# Patient Record
Sex: Female | Born: 1980
Health system: Southern US, Community
[De-identification: ages and names within clinical notes are randomized; demographics above are authoritative.]

## PROBLEM LIST (undated history)

## (undated) DIAGNOSIS — G932 Benign intracranial hypertension: Secondary | ICD-10-CM

## (undated) DIAGNOSIS — J189 Pneumonia, unspecified organism: Secondary | ICD-10-CM

## (undated) DIAGNOSIS — Z9989 Dependence on other enabling machines and devices: Secondary | ICD-10-CM

## (undated) DIAGNOSIS — E119 Type 2 diabetes mellitus without complications: Secondary | ICD-10-CM

## (undated) DIAGNOSIS — M5430 Sciatica, unspecified side: Secondary | ICD-10-CM

## (undated) DIAGNOSIS — G4730 Sleep apnea, unspecified: Secondary | ICD-10-CM

## (undated) DIAGNOSIS — H3530 Unspecified macular degeneration: Secondary | ICD-10-CM

## (undated) DIAGNOSIS — M79606 Pain in leg, unspecified: Secondary | ICD-10-CM

## (undated) DIAGNOSIS — M549 Dorsalgia, unspecified: Secondary | ICD-10-CM

## (undated) DIAGNOSIS — L732 Hidradenitis suppurativa: Secondary | ICD-10-CM

## (undated) DIAGNOSIS — E785 Hyperlipidemia, unspecified: Secondary | ICD-10-CM

## (undated) DIAGNOSIS — Z972 Presence of dental prosthetic device (complete) (partial): Secondary | ICD-10-CM

## (undated) DIAGNOSIS — H548 Legal blindness, as defined in USA: Secondary | ICD-10-CM

## (undated) DIAGNOSIS — R42 Dizziness and giddiness: Secondary | ICD-10-CM

## (undated) DIAGNOSIS — G8929 Other chronic pain: Secondary | ICD-10-CM

## (undated) HISTORY — DX: Dizziness and giddiness: R42

## (undated) HISTORY — PX: CSF SHUNT: SHX92

## (undated) HISTORY — DX: Hyperlipidemia, unspecified: E78.5

## (undated) HISTORY — DX: Hidradenitis suppurativa: L73.2

## (undated) HISTORY — DX: Benign intracranial hypertension: G93.2

## (undated) HISTORY — DX: Unspecified macular degeneration: H35.30

---

## 2003-07-20 ENCOUNTER — Emergency Department (HOSPITAL_COMMUNITY): Admission: EM | Admit: 2003-07-20 | Discharge: 2003-07-21 | Payer: Self-pay | Admitting: *Deleted

## 2003-11-28 DIAGNOSIS — G932 Benign intracranial hypertension: Secondary | ICD-10-CM

## 2003-11-28 HISTORY — DX: Benign intracranial hypertension: G93.2

## 2011-11-02 ENCOUNTER — Ambulatory Visit: Payer: Self-pay | Admitting: Family Medicine

## 2011-11-06 ENCOUNTER — Ambulatory Visit: Payer: Self-pay | Admitting: Family Medicine

## 2011-12-08 ENCOUNTER — Ambulatory Visit: Payer: Self-pay | Admitting: Family Medicine

## 2012-01-01 ENCOUNTER — Ambulatory Visit (INDEPENDENT_AMBULATORY_CARE_PROVIDER_SITE_OTHER): Payer: Medicaid Other | Admitting: Family Medicine

## 2012-01-01 ENCOUNTER — Encounter: Payer: Self-pay | Admitting: Family Medicine

## 2012-01-01 VITALS — BP 111/79 | HR 83 | Ht 68.0 in | Wt 310.9 lb

## 2012-01-01 DIAGNOSIS — F1290 Cannabis use, unspecified, uncomplicated: Secondary | ICD-10-CM

## 2012-01-01 DIAGNOSIS — H548 Legal blindness, as defined in USA: Secondary | ICD-10-CM

## 2012-01-01 DIAGNOSIS — F1210 Cannabis abuse, uncomplicated: Secondary | ICD-10-CM

## 2012-01-01 DIAGNOSIS — E663 Overweight: Secondary | ICD-10-CM

## 2012-01-01 DIAGNOSIS — Z720 Tobacco use: Secondary | ICD-10-CM

## 2012-01-01 DIAGNOSIS — E669 Obesity, unspecified: Secondary | ICD-10-CM

## 2012-01-01 DIAGNOSIS — F17200 Nicotine dependence, unspecified, uncomplicated: Secondary | ICD-10-CM

## 2012-01-01 DIAGNOSIS — Z0000 Encounter for general adult medical examination without abnormal findings: Secondary | ICD-10-CM

## 2012-01-01 DIAGNOSIS — Z131 Encounter for screening for diabetes mellitus: Secondary | ICD-10-CM

## 2012-01-01 LAB — COMPREHENSIVE METABOLIC PANEL
Albumin: 4.1 g/dL (ref 3.5–5.2)
BUN: 10 mg/dL (ref 6–23)
CO2: 26 mEq/L (ref 19–32)
Calcium: 9.1 mg/dL (ref 8.4–10.5)
Chloride: 105 mEq/L (ref 96–112)
Glucose, Bld: 85 mg/dL (ref 70–99)
Potassium: 4.3 mEq/L (ref 3.5–5.3)

## 2012-01-01 NOTE — Patient Instructions (Addendum)
I will send you the results in the mail.  I would like you to return for a pap smear and to recheck weight in 1 month.  Smoking: Call and get an appointment for smoking cessation class with Dr. Raymondo Band.   Weight management: 220lbs is the goal weight that we have set.  Walk daily-at apartment complex- 20-67min Consider meeting with nutritionist jeannine- call her for an appointment.   I recommend you stop smoking not only cigarettes but also marijuana

## 2012-01-01 NOTE — Progress Notes (Signed)
  Subjective:    Patient ID: Monica Huang, female    DOB: 1981-05-12, 31 y.o.   MRN: [id number]  HPI Patient here for new patient appointment in to establish care.  All past medical history, surgical history,social history, meds, allergies--updated under the appropriate areas of chart.  Smoking: Patient smokes half pack per day x10 years. Patient also uses marijuana daily. Patient states she would like to quit both. She states that the marijuana seems to help her vision. But otherwise knows that this is bad for her.  Patient states that she is ready to quit.no cough. No shortness of breath.  Weight management: Patient exercising 2 times a week for 45 minutes walking. States that she does not eat healthy diet. Has never met with nutritionist. Patient states that her goal weight is 220 pounds. no shortness of breath. No chest pain. Has had weight problems for a long time.  Health maintenance: Patient states last Pap smear in 2010. Patient has family history-mother and father-with diabetes. Has never had diabetes screen. Agrees to A1c screen today. Patient states she has not had A. Lipid screen. Is not fasting today.    Review of Systems As per above.    Objective:   Physical Exam  Constitutional: She appears well-developed.       obese  Neck: Thyromegaly (mild, no nodules) present.       + acanthosis nigricans  Cardiovascular: Normal rate, regular rhythm and normal heart sounds.   No murmur heard. Pulmonary/Chest: Effort normal. No respiratory distress. She has no wheezes. She has no rales.  Abdominal: Soft. She exhibits no distension. There is no tenderness.  Musculoskeletal: She exhibits no edema.  Neurological: She is alert.       Decreased vision  Skin: No rash noted.  Psychiatric: She has a normal mood and affect.          Assessment & Plan:

## 2012-01-02 ENCOUNTER — Encounter: Payer: Self-pay | Admitting: Family Medicine

## 2012-01-02 ENCOUNTER — Telehealth: Payer: Self-pay | Admitting: *Deleted

## 2012-01-02 NOTE — Telephone Encounter (Signed)
Faxed ROI to 3 [phone] .Arlyss Repress

## 2012-01-02 NOTE — Telephone Encounter (Signed)
Called pt. Need fax number from previous doctor in order to send ROI.  Waiting for call back.

## 2012-01-03 DIAGNOSIS — Z0000 Encounter for general adult medical examination without abnormal findings: Secondary | ICD-10-CM | POA: Insufficient documentation

## 2012-01-03 DIAGNOSIS — E669 Obesity, unspecified: Secondary | ICD-10-CM | POA: Insufficient documentation

## 2012-01-03 DIAGNOSIS — Z720 Tobacco use: Secondary | ICD-10-CM | POA: Insufficient documentation

## 2012-01-03 DIAGNOSIS — F1290 Cannabis use, unspecified, uncomplicated: Secondary | ICD-10-CM | POA: Insufficient documentation

## 2012-01-03 DIAGNOSIS — H548 Legal blindness, as defined in USA: Secondary | ICD-10-CM | POA: Insufficient documentation

## 2012-01-03 NOTE — Assessment & Plan Note (Signed)
Encouraged smoking cessation.  Pt to call and schedule appt with Dr. Raymondo Band for smoking cessation class.

## 2012-01-03 NOTE — Assessment & Plan Note (Signed)
Encouraged marijuana cessation.

## 2012-01-03 NOTE — Assessment & Plan Note (Signed)
Patient states last Pap smear in 2010- requesting record to be transferred.  Pt to go ahead and set up pap smear appt for in 1 month.  Will perform breast exam at time of pap smear in 1 month.  Patient has family history-mother and father-with diabetes. Has never had diabetes screen. Agrees to A1c screen today.  Patient states she has not had A. Lipid screen. Is not fasting today.

## 2012-01-03 NOTE — Assessment & Plan Note (Addendum)
220lbs is the goal weight that we have set.  Walk daily-at apartment complex- 20-61min Consider meeting with nutritionist jeannine- call her for an appointment.  Pt to return in 1 month for follow up on weight loss.  No TSH check.  Will obtain today. Difficult to do thyroid exam in setting of obesity, possible mild thyromegaly on exam.

## 2012-01-04 ENCOUNTER — Telehealth: Payer: Self-pay | Admitting: Family Medicine

## 2012-01-04 IMAGING — CR DG LUMBAR SPINE COMPLETE 4+V
5 series · 5 of 5 positions shown · non-contrast
Comparison: Abdomen films of [DATE]

CLINICAL DATA: Midline tenderness, fell 1 week ago

LUMBAR SPINE - COMPLETE 4+ VIEW

[t l-spine a.p.]
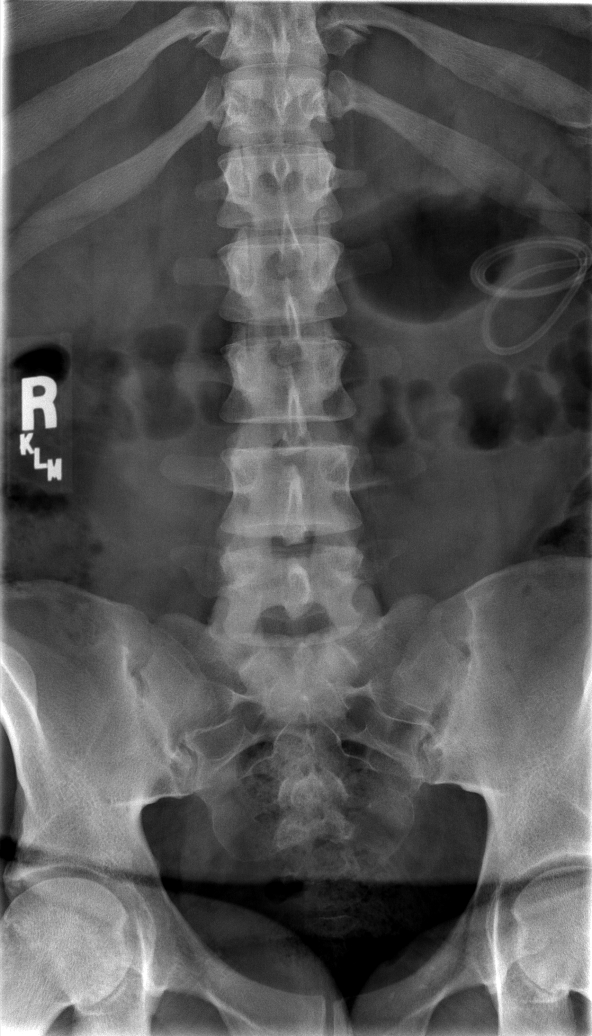

[t l-spine oblique exposure (1 of 2)]
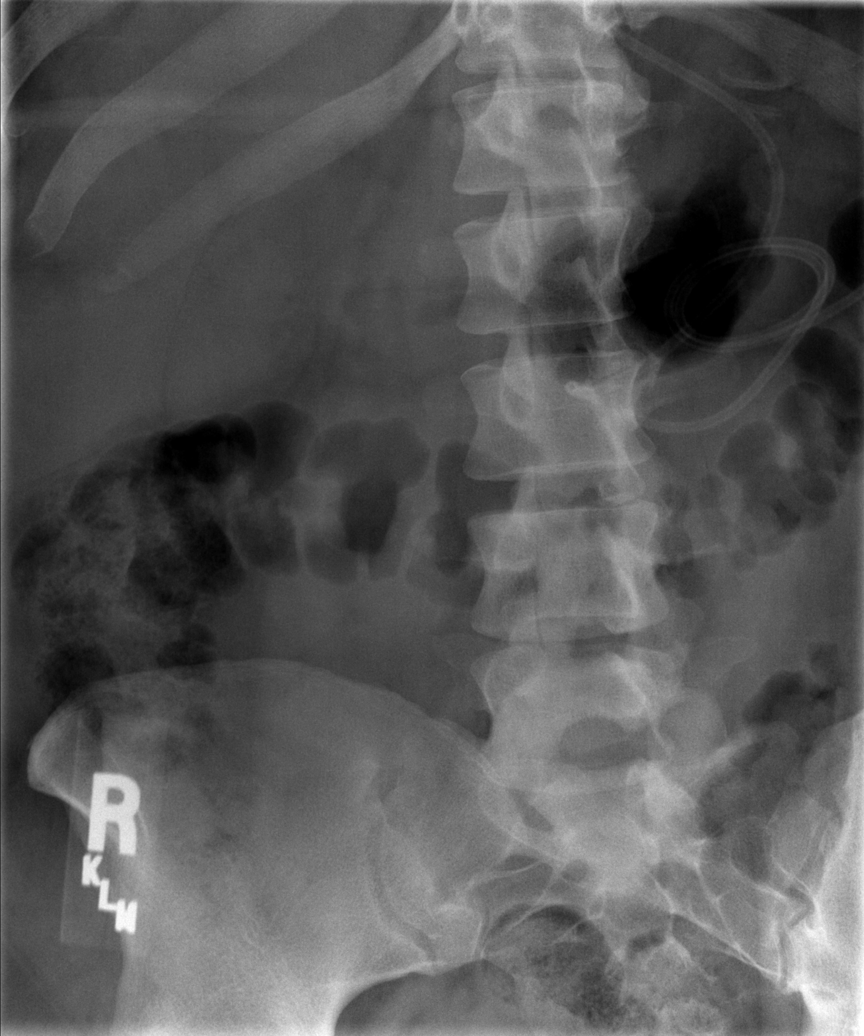

[t l-spine oblique exposure (2 of 2)]
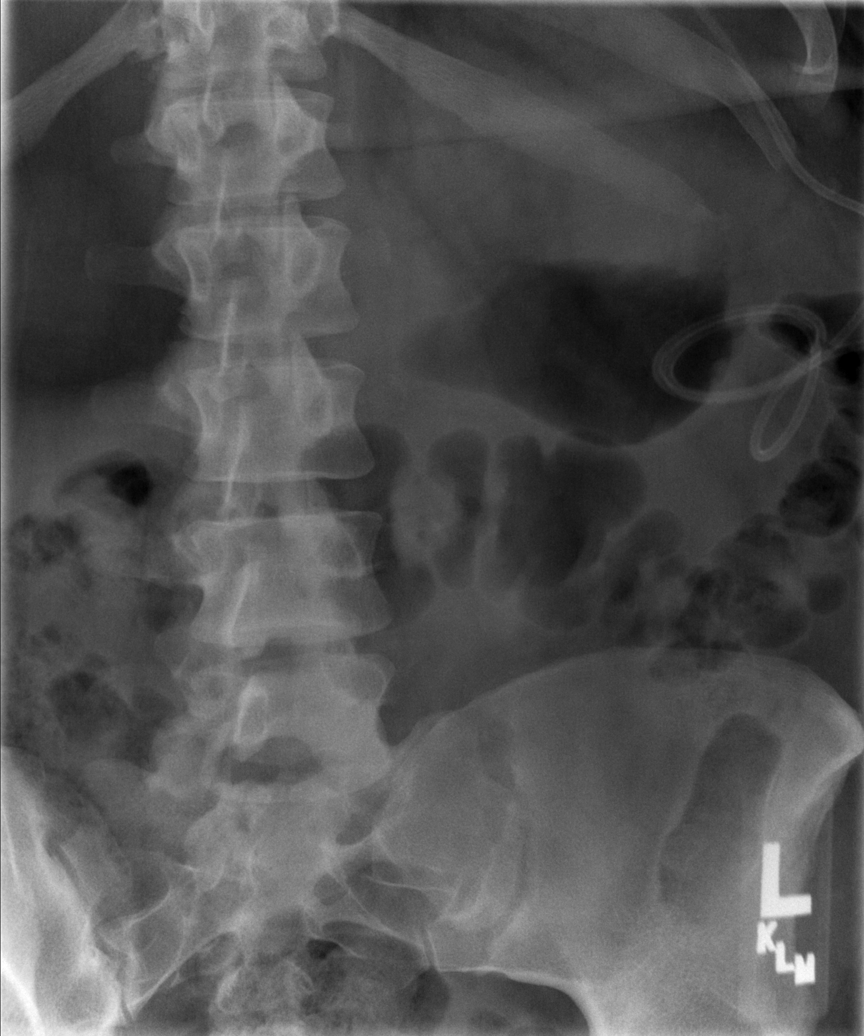

[t l-spine lat]
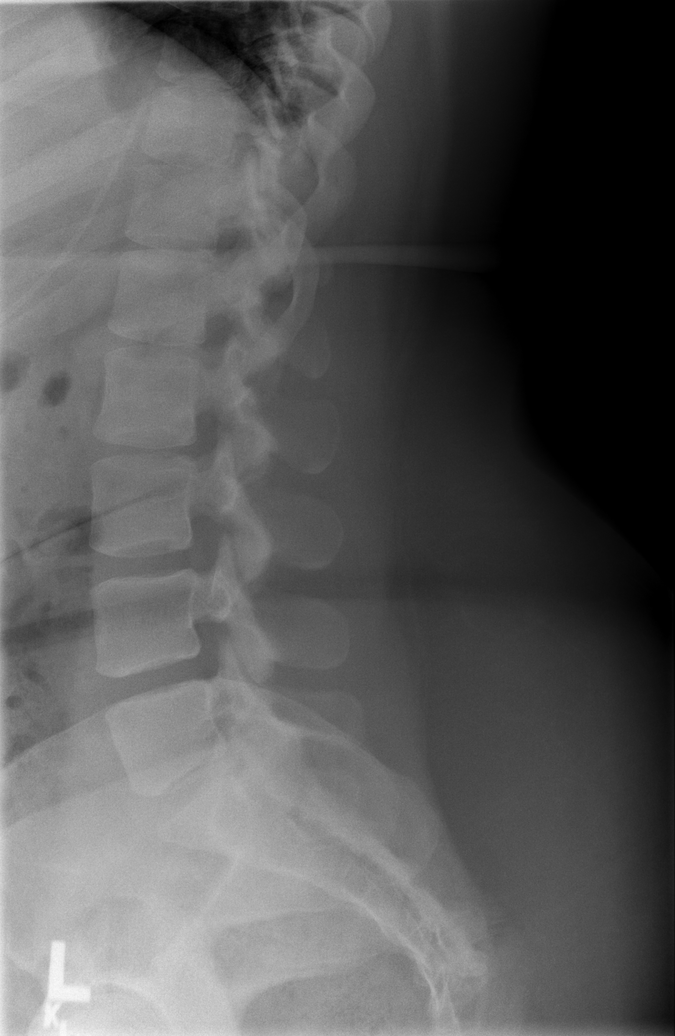

[t l-spine l5-s1 spot]
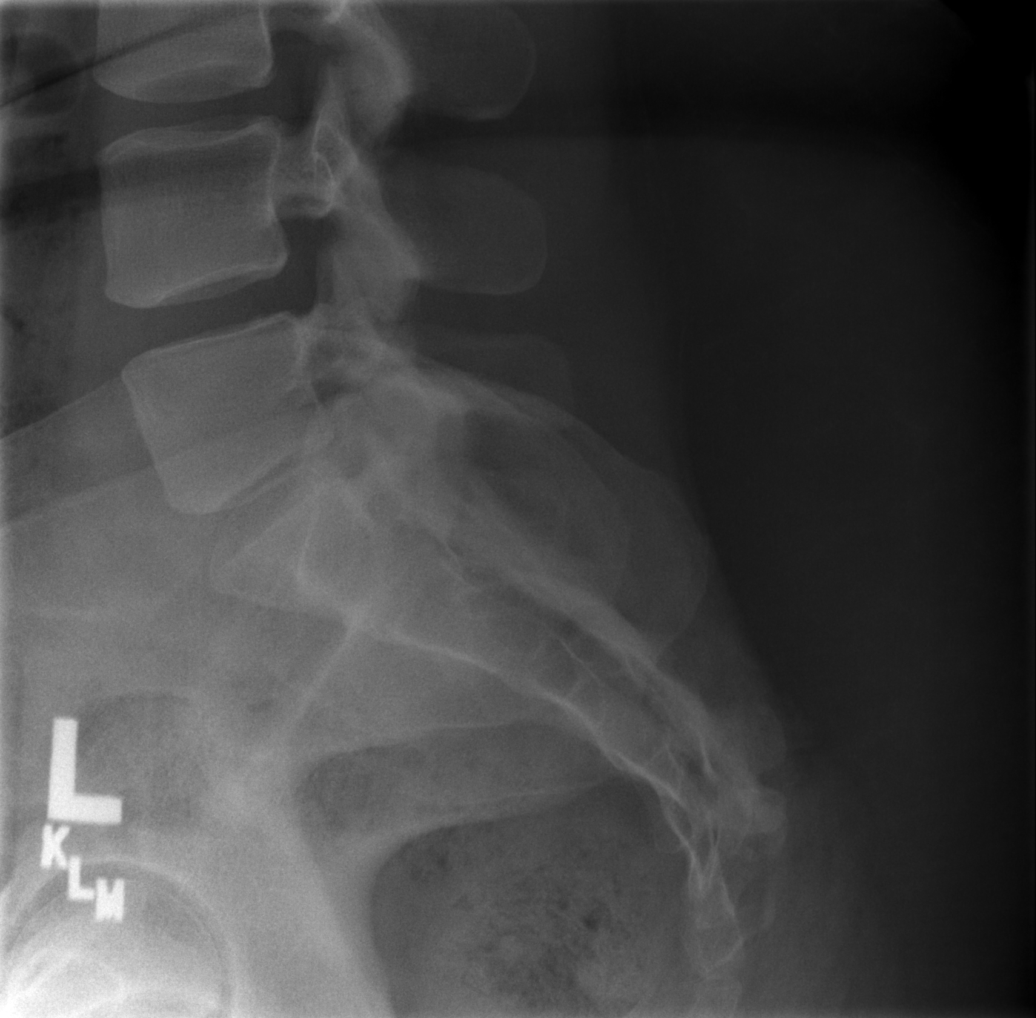

[5 of 5 positions shown; findings below may reference images not displayed]

FINDINGS: The lumbar vertebrae are in normal alignment.
Intervertebral disc spaces appear normal.  No compression deformity
is seen.  The SI joints appear normal.  A VP shunt catheter coils
in the left abdomen.
IMPRESSION: Normal alignment.  Normal disc spaces appear

## 2012-01-04 NOTE — Telephone Encounter (Signed)
Patient returned phone call and would like for nurse to call back to let her know if there was anything else needed other than fax number already given.

## 2012-01-04 NOTE — Telephone Encounter (Signed)
Called pt. Faxed ROI already. Did not call her. Lorenda Hatchet, Renato Battles

## 2012-01-24 ENCOUNTER — Telehealth: Payer: Self-pay | Admitting: Family Medicine

## 2012-01-24 NOTE — Telephone Encounter (Signed)
Is calling for a Eye Report and she needs to go to an Eye doctor to get this so she needs a referral to see an Eye MD.

## 2012-01-25 NOTE — Telephone Encounter (Signed)
Will fwd. To Dr.Caviness for review .Monica Huang  

## 2012-01-25 NOTE — Telephone Encounter (Signed)
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  Would like to see someone that is close to [place].

## 2012-01-25 NOTE — Telephone Encounter (Signed)
Left message to call back. Please ask pt: reason for eye doctor. Has she seen ophthalmologist in past? Name? Waiting for call back. Lorenda Hatchet, Renato Battles

## 2012-01-30 NOTE — Telephone Encounter (Signed)
Please let pt know that I do not have a specific opthomologist I refer patients to.  From my understanding all of the providers here in [place] do a good job.  Have pt look in phonebook or do internet search. If she would like me to send a referral all she needs to do is let me know the name and fax number of the provider and I will be glad to send a referral.

## 2012-01-31 ENCOUNTER — Encounter: Payer: Medicaid Other | Admitting: Family Medicine

## 2012-02-02 ENCOUNTER — Ambulatory Visit: Payer: Medicaid Other | Admitting: Pharmacist

## 2012-02-07 ENCOUNTER — Other Ambulatory Visit (HOSPITAL_COMMUNITY)
Admission: RE | Admit: 2012-02-07 | Discharge: 2012-02-07 | Disposition: A | Discharge status: Home / Self Care | Payer: Medicaid Other | Source: Ambulatory Visit | Attending: Family Medicine | Admitting: Family Medicine

## 2012-02-07 ENCOUNTER — Encounter: Payer: Self-pay | Admitting: Family Medicine

## 2012-02-07 ENCOUNTER — Ambulatory Visit (INDEPENDENT_AMBULATORY_CARE_PROVIDER_SITE_OTHER): Payer: Medicaid Other | Admitting: Family Medicine

## 2012-02-07 VITALS — BP 126/84 | HR 94 | Ht 68.0 in | Wt 308.0 lb

## 2012-02-07 DIAGNOSIS — E669 Obesity, unspecified: Secondary | ICD-10-CM

## 2012-02-07 DIAGNOSIS — Z01419 Encounter for gynecological examination (general) (routine) without abnormal findings: Secondary | ICD-10-CM | POA: Insufficient documentation

## 2012-02-07 DIAGNOSIS — Z720 Tobacco use: Secondary | ICD-10-CM

## 2012-02-07 DIAGNOSIS — F17200 Nicotine dependence, unspecified, uncomplicated: Secondary | ICD-10-CM

## 2012-02-07 DIAGNOSIS — N898 Other specified noninflammatory disorders of vagina: Secondary | ICD-10-CM

## 2012-02-07 DIAGNOSIS — Z113 Encounter for screening for infections with a predominantly sexual mode of transmission: Secondary | ICD-10-CM | POA: Insufficient documentation

## 2012-02-07 DIAGNOSIS — Z0000 Encounter for general adult medical examination without abnormal findings: Secondary | ICD-10-CM

## 2012-02-07 DIAGNOSIS — L03317 Cellulitis of buttock: Secondary | ICD-10-CM

## 2012-02-07 DIAGNOSIS — M79643 Pain in unspecified hand: Secondary | ICD-10-CM

## 2012-02-07 DIAGNOSIS — M545 Low back pain: Secondary | ICD-10-CM

## 2012-02-07 DIAGNOSIS — Z124 Encounter for screening for malignant neoplasm of cervix: Secondary | ICD-10-CM

## 2012-02-07 DIAGNOSIS — L0231 Cutaneous abscess of buttock: Secondary | ICD-10-CM

## 2012-02-07 DIAGNOSIS — M79609 Pain in unspecified limb: Secondary | ICD-10-CM

## 2012-02-07 NOTE — Patient Instructions (Signed)
Smoking: Call Dr. Raymondo Band and reschedule for smoking cessation appointment  Pap smear: I will mail you your result.   Weight management: Make an appointment to see me back on a Thursday in March in the nutrition-- Walk daily- at least  Hand/fingers: Try diclofenac cream on hands.  I am not sure what is causing this.   Left side pain: I think this is muscle.  Walking daily may help work this out.  If it doesn't get better with walking and exercise in the next week come back.  If new or worsening come back.

## 2012-02-07 NOTE — Progress Notes (Signed)
  Subjective:    Patient ID: Monica Huang, female    DOB: June 30, 1981, 30 y.o.   MRN: [id number]  HPI Patient here for yearly physical exam:   Screenings-recommended: Patient due for a tetanus today. Patient agrees to have Pap smear done today Patient also agrees to have breast exam done today-has not had any nipple drainage, nodules or lumps in breast.  Smoking cessation: Patient states that she is to smoking. Smokes a half pack per day x10 years. Also smoking marijuana. States she missed her appointment Dr. Hildred Laser for second cessation appointment. States she plans to reschedule.  Weight management: Patient states that she has not been exercising. She states that the weather outside as a barrier to her been able to work out. Also reports that she has not been eating a healthy diet.  Hand pain: Bilateral hand pain off and on since 2008. Now has become more constant over the past 3-4 weeks. Describes as a cramping in her hands. Sometimes comes on she is trying to do something with her hands late-term some finger grab something. Sometimes improves when she stretches her hands are massages them. No redness in joints. Sometimes perceives a swelling in her base of her right thumb.no fever.  Left side pain: Patient reports left side pain off and on times a couple of weeks. Worse with walking. Improved with lying down. No burning with urination. No retention. No frequency. No back pain. No fever. No incontinence. No headaches.       Review of Systems    as per above. Objective:   Physical Exam  Constitutional: She appears well-developed and well-nourished.  HENT:  Head: Normocephalic and atraumatic.  Eyes:       + visual impairment- pt baseline.   Neck: Normal range of motion. No thyromegaly present.  Cardiovascular: Normal rate, regular rhythm and normal heart sounds.   No murmur heard. Pulmonary/Chest: Effort normal. No respiratory distress. She has no wheezes. Right breast  exhibits no inverted nipple, no mass, no nipple discharge, no skin change and no tenderness. Left breast exhibits no inverted nipple, no mass, no nipple discharge, no skin change and no tenderness.  Abdominal: Soft. She exhibits no distension. There is no tenderness.  Genitourinary: Vagina normal and uterus normal.    There is no rash, tenderness, lesion or injury on the right labia. There is no rash, tenderness, lesion or injury on the left labia. Cervix exhibits discharge (scant white discharge). Cervix exhibits no motion tenderness and no friability. Right adnexum displays no mass, no tenderness and no fullness. Left adnexum displays no mass, no tenderness and no fullness.  Musculoskeletal: She exhibits no edema.       Hand exam bilateral- No redness. No swelling.  Normal strength bilateral.  Normal sensation.  No pain with palpation.   Left back- Minimal pain with palpation of left lower back.  Normal rom.  Normal strength in lower ext bilateral.  Normal reflexes.   Lymphadenopathy:    She has no cervical adenopathy.  Neurological: She is alert.  Skin: No rash noted.  Psychiatric: She has a normal mood and affect.          Assessment & Plan:

## 2012-02-09 ENCOUNTER — Telehealth: Payer: Self-pay | Admitting: Family Medicine

## 2012-02-09 ENCOUNTER — Other Ambulatory Visit: Payer: Self-pay | Admitting: Family Medicine

## 2012-02-09 MED ORDER — METRONIDAZOLE 500 MG PO TABS: 500.0000 mg | ORAL_TABLET | Freq: Two times a day (BID) | ORAL | Status: AC

## 2012-02-09 NOTE — Telephone Encounter (Signed)
Tried to call pt, but no answer, left message. Called pt to let her know that her Pap smear was NORMAL.  But trichomonas was found on vaginal discharge tests.  I have sent in a Rx for flagyl to patient pharmacy.  Pt to take as directed.  I left a message on pt phone asking her to call me back.  So I did not get to discuss this with patient. When pt calls back clinic staff can give the above message to the patient if I am not available to talk with her.  If she has any questions about the above I would be glad to call her.  Thanks, [employer]

## 2012-02-13 DIAGNOSIS — M79641 Pain in right hand: Secondary | ICD-10-CM | POA: Insufficient documentation

## 2012-02-13 DIAGNOSIS — M545 Low back pain: Secondary | ICD-10-CM | POA: Insufficient documentation

## 2012-02-13 DIAGNOSIS — L0231 Cutaneous abscess of buttock: Secondary | ICD-10-CM | POA: Insufficient documentation

## 2012-02-13 DIAGNOSIS — L03317 Cellulitis of buttock: Secondary | ICD-10-CM | POA: Insufficient documentation

## 2012-02-13 MED ORDER — DICLOFENAC SODIUM 1 % TD GEL: 1.0000 "application " | Freq: Four times a day (QID) | TRANSDERMAL | Status: DC

## 2012-02-13 NOTE — Assessment & Plan Note (Signed)
Most likely 2/2 msk etiology. No fever or urinary symptoms. Pt to walk daily and do home back exercises to see if this relieves pain.  Pt also to take otc pain relievers if needed.  Pt to return if any new or worsening of symptoms.

## 2012-02-13 NOTE — Assessment & Plan Note (Signed)
Unsure of cause of patients transient hand pain.  Pt to monitor symptoms.  Will give rx for diclofenac cream for patient to use to see if this helps symptoms.  Reviewed red flags for return with patient.

## 2012-02-13 NOTE — Assessment & Plan Note (Signed)
Encouraged smoking cessation.  Encouraged pt to reschedule with Dr. Raymondo Band.

## 2012-02-13 NOTE — Assessment & Plan Note (Addendum)
Pap smear obtained and sent to lab.  Will mail pt results.  All screening labwork completed at 01/01/12 appointment.  Pt was not screening for RPR or HIV at today's appointment.  Need to discuss with patient to see if she would like to have this screening at her next appt.

## 2012-02-13 NOTE — Assessment & Plan Note (Addendum)
Encouraged increased activity.  Goal states that her goal is to walk 20 minutes daily.  Also offered nutrition consult to discuss nutrition.  Pt to return in 1-2 months for recheck on weight management.

## 2012-02-13 NOTE — Assessment & Plan Note (Signed)
Draining and healing well.  No further intervention needed at this time.  Red flags for return discussed with patient.

## 2012-02-19 ENCOUNTER — Telehealth: Payer: Self-pay | Admitting: *Deleted

## 2012-02-19 NOTE — Telephone Encounter (Signed)
Patient called for results of pap and STD screen, she was informed everything was normal.Monica Huang, Rodena Medin

## 2012-02-19 NOTE — Telephone Encounter (Signed)
Pt informed of Trichomonas on pap smear. Advised to take meds that were sent to the pharmacy. Pt reports, that she has no symptoms of vaginal itching or discharge and has been with the same partner for 5 years. She had Trichomonas about 5 years ago and was treated for it. Her partner denies symptoms too. Pt is very upset about this and request for Dr.Caviness to call her. Fwd to [facility] for review.  Lorenda Hatchet, Renato Battles

## 2012-02-22 ENCOUNTER — Ambulatory Visit: Payer: Medicaid Other | Admitting: Family Medicine

## 2012-02-22 NOTE — Telephone Encounter (Signed)
Called patient and answered questions.  Pt to call and schedule appointment for follow up. Pt prefers to have wet prep done for confirmation of trich that was found on pap smear sample.  At f/up appointment will do wet prep and decide on treatment plan.

## 2012-03-02 ENCOUNTER — Emergency Department (HOSPITAL_COMMUNITY)
Admission: EM | Admit: 2012-03-02 | Discharge: 2012-03-03 | Disposition: A | Discharge status: Home / Self Care | Payer: Medicaid Other | Attending: Emergency Medicine | Admitting: Emergency Medicine

## 2012-03-02 ENCOUNTER — Encounter (HOSPITAL_COMMUNITY): Payer: Self-pay | Admitting: *Deleted

## 2012-03-02 DIAGNOSIS — E785 Hyperlipidemia, unspecified: Secondary | ICD-10-CM | POA: Insufficient documentation

## 2012-03-02 DIAGNOSIS — M545 Low back pain, unspecified: Secondary | ICD-10-CM | POA: Insufficient documentation

## 2012-03-02 MED ORDER — OXYCODONE-ACETAMINOPHEN 5-325 MG PO TABS: 1.0000 | ORAL_TABLET | ORAL | Status: DC | PRN

## 2012-03-02 MED ORDER — KETOROLAC TROMETHAMINE 60 MG/2ML IM SOLN
60.0000 mg | Freq: Once | INTRAMUSCULAR | Status: AC
  Administered 2012-03-02: 60 mg via INTRAMUSCULAR
  Filled 2012-03-02: qty 2

## 2012-03-02 MED ORDER — METHYLPREDNISOLONE 4 MG PO KIT: PACK | ORAL | Status: AC

## 2012-03-02 MED ORDER — OXYCODONE-ACETAMINOPHEN 5-325 MG PO TABS
1.0000 | ORAL_TABLET | Freq: Once | ORAL | Status: AC
  Administered 2012-03-02: 1 via ORAL
  Filled 2012-03-02: qty 1

## 2012-03-02 MED ORDER — PREDNISONE 20 MG PO TABS
60.0000 mg | ORAL_TABLET | ORAL | Status: AC
  Administered 2012-03-02: 60 mg via ORAL
  Filled 2012-03-02: qty 3

## 2012-03-02 NOTE — ED Notes (Signed)
Pt states understanding of discharge instructions 

## 2012-03-02 NOTE — ED Provider Notes (Signed)
History     CSN: [id number]  Arrival date & time 03/02/12  2018   First MD Initiated Contact with Patient 03/02/12 2106      Chief Complaint  Patient presents with  . Back Pain    (Consider location/radiation/quality/duration/timing/severity/associated sxs/prior treatment) HPI History from patient. 31 year old female presents with back pain. This started 2 weeks ago. She recalls getting off the bus and "twisting funny," but is unsure if this was the cause for her pain. Pain is described as sharp and worsens with any movement. It radiates into her right buttock and occasionally shoots down her thigh to her knee. She has never had anything like this in the past. She did try ibuprofen for pain which did not help much. No other treatments tried. She denies any numbness or weakness in her legs, and has been able to walk is normal. Denies saddle anesthesia. Denies bowel/bladder incontinence or urinary retention. No fever, chills, abdominal pain, urinary symptoms, flank pain, chest pain, shortness of breath.  Past Medical History  Diagnosis Date  . Pseudotumor cerebri syndrome 2005    shunt placed- and legally blind  . Hyperlipidemia   . Hydradenitis     Past Surgical History  Procedure Date  . Csf shunt     2 revisions    Family History  Problem Relation Age of Onset  . Diabetes Mother   . Hypertension Mother   . Diabetes Father     History  Substance Use Topics  . Smoking status: Current Everyday Smoker -- 0.5 packs/day    Types: Cigarettes  . Smokeless tobacco: Not on file  . Alcohol Use: No    OB History    Grav Para Term Preterm Abortions TAB SAB Ect Mult Living                  Review of Systems as per history of present illness  Allergies  Review of patient's allergies indicates no known allergies.  Home Medications   Current Outpatient Rx  Name Route Sig Dispense Refill  . HYDROCODONE-ACETAMINOPHEN 5-500 MG PO TABS Oral Take 1 tablet by mouth every 6  (six) hours as needed. For pain    . IBUPROFEN 200 MG PO TABS Oral Take 200 mg by mouth every 6 (six) hours as needed. For pain    . PRESCRIPTION MEDICATION Oral Take 1 tablet by mouth daily.       BP 103/64  Pulse 63  Temp(Src) 98.5 F (36.9 C) (Oral)  Resp 20  SpO2 100%  LMP 01/22/2012  Physical Exam  Nursing note and vitals reviewed. Constitutional: She is oriented to person, place, and time. She appears well-developed and well-nourished. No distress.  HENT:  Head: Normocephalic and atraumatic.  Neck: Normal range of motion.  Cardiovascular: Normal rate, regular rhythm and normal heart sounds.   Pulmonary/Chest: Effort normal and breath sounds normal. She exhibits no tenderness.  Abdominal: Soft. There is no tenderness. There is no rebound and no guarding.       No CVA tenderness  Musculoskeletal: Normal range of motion.       Arms:      Spine: No palpable stepoff, crepitus, or gross deformity appreciated. No midline tenderness. No appreciable spasm of paravertebral muscles. Tender to palpation to the right lateral low back. Nontender to palpation of right buttock. Patient avoids movement to avoid exacerbating symptoms.  Neurological: She is alert and oriented to person, place, and time.       Strength 5/5 on ankle flex/ext,  knee flex/ext. DTRs 2+ and symmetrical at achilles.  Skin: Skin is warm and dry. She is not diaphoretic.  Psychiatric: She has a normal mood and affect.    ED Course  Procedures (including critical care time)  Labs Reviewed - No data to display No results found.   1. Low back pain       MDM  Patient presents with low back pain with occasional radiation to R thigh. No known injury. No "red flags" on hx for back pain; exam unremarkable. This seems clinically consistent with sciatica. Patient was treated in the ED with Percocet, Toradol and a dose of prednisone which she stated improved her symptoms. Will discharge home with Medrol Dosepak and  prescription for Percocet. Discussed symptoms that would prompt a return visit. She was instructed to followup with PCP if not improving. She was agreeable to plan.        [place], [place] 03/03/12 [phone]

## 2012-03-02 NOTE — Discharge Instructions (Signed)
You likely have sciatica in your back. This is a condition where one of the nerves gets irritated. Take the steroid as prescribed until it is gone. Use the pain medication as needed. Follow up with your primary care doctor if not improving. Return to the ER if you develop weakness in your legs, inability to walk, or any other worrisome symptoms.  RESOURCE GUIDE  Dental Problems  Patients with Medicaid: [facility] [phone] [address]                                           [phone] [address] Phone:  [phone]                                                  Phone:  [phone]  If unable to pay or uninsured, contact:  [facility] or [facility]. to become qualified for the adult dental clinic.  Chronic Pain Problems Contact [facility]  [phone] Patients need to be referred by their primary care doctor.  Insufficient Money for Medicine Contact [employer]:  call "211" or [facility] 208-508-5941.  No Primary Care Doctor Call [facility]  7026178544 Other agencies that provide inexpensive medical care    [facility]  5872010076    [facility]  [phone]    [facility]  [phone]    [facility]  [phone]    [facility]  7620721811    [facility]  [phone]  Psychological Services [facility]  [phone] [facility]  [phone] [facility]   [phone] (emergency services ([phone])  Substance Abuse Resources [facility]  [phone] [facility] 4156064960 [facility] [place] 5627729274 Floydene Flock [phone] Residential & Outpatient Substance Abuse Program  [phone]  Abuse/Neglect [facility] Child Abuse Hotline 586 669 9203 [place] Child Abuse Hotline 225-441-0792 (After Hours)  Emergency Shelter [facility] [phone]  M[facility] [place] [facility] [phone] [facility] [phone]  MRSA Hotline #:   613-219-1567    [facility] Resources  [facility]     [employer]                          [facility] Dept. [address] [place]                       [address]      [address]  [place]                                                Cristobal Goldmann Phone:  [phone]  Phone:  [phone]                 Phone:  [phone]  [facility] Phone:  [phone]  [facility] Child Abuse Hotline 810 663 8008 705-814-4502 (After Hours)  Back Pain, Adult Low back pain is very common. About 1 in 5 people have back pain.The cause of low back pain is rarely dangerous. The pain often gets better over time.About half of people with a sudden onset of back pain feel better in just 2 weeks. About 8 in 10 people feel better by 6 weeks.  CAUSES Some common causes of back pain include:  Strain of the muscles or ligaments supporting the spine.   Wear and tear (degeneration) of the spinal discs.   Arthritis.   Direct injury to the back.  DIAGNOSIS Most of the time, the direct cause of low back pain is not known.However, back pain can be treated effectively even when the exact cause of the pain is unknown.Answering your caregiver's questions about your overall health and symptoms is one of the most accurate ways to make sure the cause of your pain is not dangerous. If your caregiver needs more information, he or she may order lab work or imaging tests (X-rays or MRIs).However, even if imaging tests show changes in your back, this usually does not require surgery. HOME CARE INSTRUCTIONS For many people, back pain returns.Since low back pain is rarely dangerous, it is often a condition that people can learn to [facility] their  own.   Remain active. It is stressful on the back to sit or stand in one place. Do not sit, drive, or stand in one place for more than 30 minutes at a time. Take short walks on level surfaces as soon as pain allows.Try to increase the length of time you walk each day.   Do not stay in bed.Resting more than 1 or 2 days can delay your recovery.   Do not avoid exercise or work.Your body is made to move.It is not dangerous to be active, even though your back may hurt.Your back will likely heal faster if you return to being active before your pain is gone.   Pay attention to your body when you bend and lift. Many people have less discomfortwhen lifting if they bend their knees, keep the load close to their bodies,and avoid twisting. Often, the most comfortable positions are those that put less stress on your recovering back.   Find a comfortable position to sleep. Use a firm mattress and lie on your side with your knees slightly bent. If you lie on your back, put a pillow under your knees.   Only take over-the-counter or prescription medicines as directed by your caregiver. Over-the-counter medicines to reduce pain and inflammation are often the most helpful.Your caregiver may prescribe muscle relaxant drugs.These medicines help dull your pain so you can more quickly return to your normal activities and healthy exercise.   Put ice on the injured area.   Put ice in a plastic bag.   Place a towel between your skin and the bag.   Leave the ice on for 15 to 20 minutes, 3 to 4 times a day for the first 2 to 3 days. After that, ice and heat may be alternated to reduce pain and spasms.   Ask your caregiver about trying back exercises and gentle massage. This may be of some benefit.   Avoid feeling anxious or stressed.Stress increases muscle tension and can worsen  back pain.It is important to recognize when you are anxious or stressed and learn ways to manage it.Exercise is a great option.   SEEK MEDICAL CARE IF:  You have pain that is not relieved with rest or medicine.   You have pain that does not improve in 1 week.   You have new symptoms.   You are generally not feeling well.  SEEK IMMEDIATE MEDICAL CARE IF:   You have pain that radiates from your back into your legs.   You develop new bowel or bladder control problems.   You have unusual weakness or numbness in your arms or legs.   You develop nausea or vomiting.   You develop abdominal pain.   You feel faint.  Document Released: 11/13/2005 Document Revised: 11/02/2011 Document Reviewed: 04/03/2011 [facility] Patient Information 2012 [place], [place].

## 2012-03-02 NOTE — ED Notes (Signed)
Pt arrived via [facility] c/o of BP after stepping off bus torquing her back a week ago. Pain progressively getting worse.

## 2012-03-03 NOTE — ED Provider Notes (Signed)
Medical screening examination/treatment/procedure(s) were performed by non-physician practitioner and as supervising physician I was immediately available for consultation/collaboration.  Doug Sou, MD 03/03/12 [phone]

## 2012-03-04 ENCOUNTER — Ambulatory Visit: Payer: Medicaid Other | Admitting: Pharmacist

## 2012-03-05 ENCOUNTER — Ambulatory Visit (INDEPENDENT_AMBULATORY_CARE_PROVIDER_SITE_OTHER): Payer: Medicaid Other | Admitting: Family Medicine

## 2012-03-05 ENCOUNTER — Encounter: Payer: Self-pay | Admitting: Family Medicine

## 2012-03-05 VITALS — BP 144/92 | HR 82 | Ht 68.0 in | Wt 303.2 lb

## 2012-03-05 DIAGNOSIS — M545 Low back pain, unspecified: Secondary | ICD-10-CM

## 2012-03-05 MED ORDER — OXYCODONE-ACETAMINOPHEN 5-325 MG PO TABS: 1.0000 | ORAL_TABLET | ORAL | Status: AC | PRN

## 2012-03-05 NOTE — Assessment & Plan Note (Signed)
Lumbar pain likely do to muscular injury.  I feel the probability of this being a herniated disc or fracture very unlikely.  However she does have midline back pain and is in significant pain in the office today. She had what could conservatively be called an Injury therefore I feel that x-ray of her lumbar back is warranted. Plan to obtain lumbar back films, prescribe oxycodone and followup in one or 2 weeks. Discussed warning signs or symptoms with patient who expresses understanding. Additionally I provided a handout. Please see patient instructions.

## 2012-03-05 NOTE — Progress Notes (Signed)
Monica Huang is a 31 y.o. female who presents to [facility] today for acute bilateral lumbar back pain. Been present for the last 6-7 days. Was evaluated in the emergency room 4 days ago and prescribed a Medrol Dosepak and Percocet. She denies any specific injury or falls. However approximately 2-3 days before the pain started she missed a step getting off the bus and landed hard on her leg.  She denies any immediate pain but did note gradual onset of low back pain that she has now following this misstep. She notes pain in the bilateral paravertebral spinal lumbar areas it radiates to the right thigh. She denies any weakness numbness bowel or bladder dysfunction. She's taking ibuprofen as well as Percocet and methylprednisolone prescribed by the emergency room.     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 No Chest pain, palpitations, SOB, Fever, Chills, Abd pain, N/V/D.  Medications reviewed. Current Outpatient Prescriptions  Medication Sig Dispense Refill  . methylPREDNISolone (MEDROL DOSEPAK) 4 MG tablet 6 tabs PO (24 mg) day one, 5 tabs day two, continue to taper until gone per package instructions  21 tablet  0  . oxyCODONE-acetaminophen (PERCOCET) 5-325 MG per tablet Take 1 tablet by mouth every 4 (four) hours as needed for pain.  25 tablet  0    Exam:  BP 144/92  Pulse 82  Ht 5\' 8"  (1.727 m)  Wt 303 lb 3.2 oz (137.531 kg)  BMI 46.10 kg/m2  LMP 02/24/2012 Gen: Well NAD, in pain appearing, morbidly obese Lungs: CTABL Nl WOB Heart: RRR no MRG MSK: Nontender over the cervical and thoracic spines. Mildly tender to palpation over the midline lumbar spine along with bilateral paraspinal lumbar areas and bilateral SI joints.  Patient experiences pain when trying to get onto the exam table however she is able to do that by herself. Reflexes are diminished but equal bilaterally. Strength and sensation is preserved in both  legs. When walking she has a painful gait and uses her blind a walking stick as a cane  No results found for this or any previous visit (from the past 72 hour(s)).

## 2012-03-05 NOTE — Patient Instructions (Signed)
Thank you for coming in today. I think this is a muscle strain.  Stop the medrol dose pack.  Take ibuprofen up to 4 pills every 8 hours.  Also take the oxycodone as needed.  Stay active as much as possible.  Come back or go to the emergency room if you notice new weakness new numbness problems walking or bowel or bladder problems.  Back Pain, Adult Low back pain is very common. About 1 in 5 people have back pain. The cause of low back pain is rarely dangerous. The pain often gets better over time. About half of people with a sudden onset of back pain feel better in just 2 weeks. About 8 in 10 people feel better by 6 weeks.   CAUSES Some common causes of back pain include:  Strain of the muscles or ligaments supporting the spine.   Wear and tear (degeneration) of the spinal discs.   Arthritis.   Direct injury to the back.  DIAGNOSIS Most of the time, the direct cause of low back pain is not known. However, back pain can be treated effectively even when the exact cause of the pain is unknown. Answering your caregiver's questions about your overall health and symptoms is one of the most accurate ways to make sure the cause of your pain is not dangerous. If your caregiver needs more information, he or she may order lab work or imaging tests (X-rays or MRIs). However, even if imaging tests show changes in your back, this usually does not require surgery. HOME CARE INSTRUCTIONS For many people, back pain returns. Since low back pain is rarely dangerous, it is often a condition that people can learn to manage on their own.    Remain active. It is stressful on the back to sit or stand in one place. Do not sit, drive, or stand in one place for more than 30 minutes at a time. Take short walks on level surfaces as soon as pain allows. Try to increase the length of time you walk each day.   Do not stay in bed. Resting more than 1 or 2 days can delay your recovery.   Do not avoid exercise or work.  Your body is made to move. It is not dangerous to be active, even though your back may hurt. Your back will likely heal faster if you return to being active before your pain is gone.   Pay attention to your body when you  bend and lift. Many people have less discomfort when lifting if they bend their knees, keep the load close to their bodies, and avoid twisting. Often, the most comfortable positions are those that put less stress on your recovering back.   Find a comfortable position to sleep. Use a firm mattress and lie on your side with your knees slightly bent. If you lie on your back, put a pillow under your knees.   Only take over-the-counter or prescription medicines as directed by your caregiver. Over-the-counter medicines to reduce pain and inflammation are often the most helpful. Your caregiver may prescribe muscle relaxant drugs. These medicines help dull your pain so you can more quickly return to your normal activities and healthy exercise.   Put ice on the injured area.   Put ice in a plastic bag.   Place a towel between your skin and the bag.   Leave the ice on for 15 to 20 minutes, 3 to 4 times a day for the first 2 to 3 days. After  that, ice and heat may be alternated to reduce pain and spasms.   Ask your caregiver about trying back exercises and gentle massage. This may be of some benefit.   Avoid feeling anxious or stressed. Stress increases muscle tension and can worsen back pain. It is important to recognize when you are anxious or stressed and learn ways to manage it. Exercise is a great option.  SEEK MEDICAL CARE IF:  You have pain that is not relieved with rest or medicine.   You have pain that does not improve in 1 week.   You have new symptoms.   You are generally not feeling well.  SEEK IMMEDIATE MEDICAL CARE IF:    You have pain that radiates from your back into your legs.   You develop new bowel or bladder control problems.   You have unusual weakness or  numbness in your arms or legs.   You develop nausea or vomiting.   You develop abdominal pain.   You feel faint.  Document Released: 11/13/2005 Document Revised: 11/02/2011 Document Reviewed: 04/03/2011 [facility] Patient Information 2012 [place], [place].

## 2012-03-08 ENCOUNTER — Ambulatory Visit
Admission: RE | Admit: 2012-03-08 | Discharge: 2012-03-08 | Disposition: A | Discharge status: Home / Self Care | Payer: Medicaid Other | Source: Ambulatory Visit | Attending: Family Medicine | Admitting: Family Medicine

## 2012-03-08 DIAGNOSIS — M545 Low back pain, unspecified: Secondary | ICD-10-CM

## 2012-03-12 ENCOUNTER — Ambulatory Visit (INDEPENDENT_AMBULATORY_CARE_PROVIDER_SITE_OTHER): Payer: Medicaid Other | Admitting: Family Medicine

## 2012-03-12 ENCOUNTER — Encounter: Payer: Self-pay | Admitting: Family Medicine

## 2012-03-12 VITALS — BP 130/80 | HR 88 | Ht 68.0 in | Wt 298.5 lb

## 2012-03-12 DIAGNOSIS — M545 Low back pain, unspecified: Secondary | ICD-10-CM

## 2012-03-12 DIAGNOSIS — M549 Dorsalgia, unspecified: Secondary | ICD-10-CM

## 2012-03-12 MED ORDER — KETOROLAC TROMETHAMINE 60 MG/2ML IM SOLN
60.0000 mg | Freq: Once | INTRAMUSCULAR | Status: AC
  Administered 2012-03-12: 60 mg via INTRAMUSCULAR

## 2012-03-12 MED ORDER — IBUPROFEN 600 MG PO TABS: 600.0000 mg | ORAL_TABLET | Freq: Three times a day (TID) | ORAL | Status: AC | PRN

## 2012-03-12 MED ORDER — CYCLOBENZAPRINE HCL 5 MG PO TABS: 5.0000 mg | ORAL_TABLET | Freq: Two times a day (BID) | ORAL | Status: AC | PRN

## 2012-03-12 NOTE — Patient Instructions (Signed)

## 2012-03-12 NOTE — Progress Notes (Addendum)
Subjective:     Patient ID: Monica Huang, female   DOB: 12-23-1980, 31 y.o.   MRN: [id number]  HPI Monica Huang is following up regarding her back pain. Two weeks ago, she landed abruptly on her R heel as she got off the bus. She has had debilitating lower R back pain since. The pain limits her mobility and disrupts her sleep. She was seen first in the ER, and she received a Toradol shot. She followed up w Dr. Denyse Amass last week. Lumbar x-rays from the last visit are unremarkable. She was treated with percocet until follow up.  Today, she reports that the pain in her R back is greatly improved, yet giving her considerable trouble. Pain is sometimes describes as burning over her R paraspinous muscles, radiating slightly to R thigh and across abdomen. Pain is present at rest, however it is amplified tremendously with movement. She is currently not taking any NSAIDs.  Review of Systems     Objective:   Physical Exam Gen: Leaning significantly to her left. In significant distress with movement. Legally blind with seeing cane in R arm. MSK: TTP over R paraspinous process and upper gluteus regions. Weakness and pain with flexion of R thigh, extension of R leg. Negative Faber. Straight leg test could not be assessed 2/2 pain. Neuro: Sensation grossly intact in LE. Limited ROM in R LE due to pain    Assessment:           Plan:     Monica Huang has been experiencing significant R sided lower back pain since breaking a fall with her heel two weeks ago. X-rays have been negative. She has discrete tenderness over her R paraspinous muscles and has very limited mobility 2/2 pain. Likely etiology of injury is a muscular strain. Pain is improving since last visit, which is reassuring. We will give her toradol injection today and prescribe ibuprofen 600mg  for continued anti-inflammation. Also prescribed flexeril 5mg  BID for symptomatic relief.      Pt seen and examined with [facility] MS4 and agree with above  MS4 assessment and plan.

## 2012-03-13 NOTE — Assessment & Plan Note (Signed)
Monica Huang has been experiencing significant R sided lower back pain since breaking a fall with her heel two weeks ago. X-rays have been negative. She has discrete tenderness over her R paraspinous muscles and has very limited mobility 2/2 pain. Likely etiology of injury is a muscular strain. Pain is improving since last visit, which is reassuring. We will give her toradol injection today and prescribe ibuprofen 600mg  for continued anti-inflammation. Also prescribed flexeril 5mg  BID for symptomatic relief.

## 2012-03-18 ENCOUNTER — Ambulatory Visit: Payer: Medicaid Other | Admitting: Family Medicine

## 2012-04-05 ENCOUNTER — Ambulatory Visit: Payer: Medicaid Other | Admitting: Pharmacist

## 2012-06-04 ENCOUNTER — Ambulatory Visit: Payer: Medicaid Other

## 2012-08-02 ENCOUNTER — Ambulatory Visit (INDEPENDENT_AMBULATORY_CARE_PROVIDER_SITE_OTHER): Payer: Medicaid Other | Admitting: Family Medicine

## 2012-08-02 ENCOUNTER — Encounter: Payer: Self-pay | Admitting: Family Medicine

## 2012-08-02 VITALS — BP 110/78 | HR 80 | Temp 98.1°F | Ht 68.0 in | Wt 298.0 lb

## 2012-08-02 DIAGNOSIS — R209 Unspecified disturbances of skin sensation: Secondary | ICD-10-CM

## 2012-08-02 DIAGNOSIS — R200 Anesthesia of skin: Secondary | ICD-10-CM

## 2012-08-02 NOTE — Assessment & Plan Note (Signed)
She presents with trochanteric bursitis on her right side. Steroid injection was performed. This may be causing her right leg numbness. She will follow-up in 2 weeks if the pain persists.

## 2012-08-02 NOTE — Patient Instructions (Addendum)
Follow-up with Dr. Tye Savoy in 2 weeks  Hip Bursitis Bursitis is a swelling and soreness (inflammation) of a fluid-filled sac (bursa). This sac overlies and protects the joints.  CAUSES   Injury.   Overuse of the muscles surrounding the joint.   Arthritis.   Gout.   Infection.   Cold weather.   Inadequate warm-up and conditioning prior to activities.  The cause may not be known.  SYMPTOMS   Mild to severe irritation.   Tenderness and swelling over the outside of the hip.   Pain with motion of the hip.   If the bursa becomes infected, a fever may be present. Redness, tenderness, and warmth will develop over the hip.  Symptoms usually lessen in 3 to 4 weeks with treatment, but can come back. TREATMENT If conservative treatment does not work, your caregiver may advise draining the bursa and injecting cortisone into the area. This may speed up the healing process. This may also be used as an initial treatment of choice. HOME CARE INSTRUCTIONS   Apply ice to the affected area for 15 to 20 minutes every 3 to 4 hours while awake for the first 2 days. Put the ice in a plastic bag and place a towel between the bag of ice and your skin.   Rest the painful joint as much as possible, but continue to put the joint through a normal range of motion at least 4 times per day. When the pain lessens, begin normal, slow movements and usual activities to help prevent stiffness of the hip.   Only take over-the-counter or prescription medicines for pain, discomfort, or fever as directed by your caregiver.   Use crutches to limit weight bearing on the hip joint, if advised.   Elevate your painful hip to reduce swelling. Use pillows for propping and cushioning your legs and hips.   Gentle massage may provide comfort and decrease swelling.  SEEK IMMEDIATE MEDICAL CARE IF:   Your pain increases even during treatment, or you are not improving.   You have a fever.   You have heat and  inflammation over the involved bursa.   You have any other questions or concerns.  MAKE SURE YOU:   Understand these instructions.   Will watch your condition.   Will get help right away if you are not doing well or get worse.  Document Released: 05/05/2002 Document Revised: 11/02/2011 Document Reviewed: 12/02/2008 [facility] Patient Information 2012 [place], [place].

## 2012-08-02 NOTE — Progress Notes (Signed)
  Subjective:    Patient ID: Monica Huang, female    DOB: [birth year], 31 y.o.   MRN: [id number]  HPI # Worsening right leg numbness Her right leg has been falling asleep/becoming numb for the past month and then sometimes hurting because of the numbness.  She denies preceding injury, however, she does report that she stepped off the bus and landed hard on her right leg in April 2013. She had some right leg and back pain at that time. These pains had resolved. Her current leg discomfort is different from what she experienced before.  She says the numbness lasts for seconds. It improves when she shakes her leg.   Review of Systems Denies back pain Denies right leg weakness Denies change in urine or bowel function  Denies skin changes  Allergies, medication, past medical history reviewed.  Recent lumbar x-ray reviewed. No abnormalities.    Objective:   Physical Exam GEN: NAD; morbidly obese HEENT: legally blind but able to see some MSK:    RIGHT LEG:      Hip internal and external rotation intact     No skin abnormalities including rash or bruising     Significant tenderness palpation of right trochanter     Sensation: intact     Motor: 5/5 flexion and extension. 4/5 abduction (5/5 on left side)     Negative straight leg raises bilaterally  Procedure note: right trochanteric bursa injection Written informed consent discussed and signed. Most significant area of tenderness of trochanter marked Area prepped with Betadine x 2 and alcohol swabs x 2 Cool sprayed applied Area injected with 60 mg of methylprednisolone with 20 mg of lidocaine without epinephrine Minimal blood loss Area covered with bandaid    Assessment & Plan:

## 2012-10-08 ENCOUNTER — Ambulatory Visit: Payer: Medicaid Other | Admitting: Family Medicine

## 2012-10-15 ENCOUNTER — Encounter: Payer: Self-pay | Admitting: Family Medicine

## 2012-10-15 ENCOUNTER — Ambulatory Visit (INDEPENDENT_AMBULATORY_CARE_PROVIDER_SITE_OTHER): Payer: Medicaid Other | Admitting: Family Medicine

## 2012-10-15 ENCOUNTER — Other Ambulatory Visit (HOSPITAL_COMMUNITY)
Admission: RE | Admit: 2012-10-15 | Discharge: 2012-10-15 | Disposition: A | Discharge status: Home / Self Care | Payer: Medicaid Other | Source: Ambulatory Visit | Attending: Family Medicine | Admitting: Family Medicine

## 2012-10-15 VITALS — BP 110/75 | HR 103 | Temp 98.3°F | Ht 68.0 in | Wt 305.0 lb

## 2012-10-15 DIAGNOSIS — Z7251 High risk heterosexual behavior: Secondary | ICD-10-CM

## 2012-10-15 DIAGNOSIS — L732 Hidradenitis suppurativa: Secondary | ICD-10-CM | POA: Insufficient documentation

## 2012-10-15 DIAGNOSIS — R209 Unspecified disturbances of skin sensation: Secondary | ICD-10-CM

## 2012-10-15 DIAGNOSIS — R200 Anesthesia of skin: Secondary | ICD-10-CM

## 2012-10-15 DIAGNOSIS — Z113 Encounter for screening for infections with a predominantly sexual mode of transmission: Secondary | ICD-10-CM | POA: Insufficient documentation

## 2012-10-15 LAB — POCT WET PREP (WET MOUNT)

## 2012-10-15 MED ORDER — METRONIDAZOLE 500 MG PO TABS: 500.0000 mg | ORAL_TABLET | Freq: Two times a day (BID) | ORAL | Status: DC

## 2012-10-15 MED ORDER — DICLOFENAC SODIUM 75 MG PO TBEC: 75.0000 mg | DELAYED_RELEASE_TABLET | Freq: Two times a day (BID) | ORAL | Status: DC

## 2012-10-15 MED ORDER — SULFAMETHOXAZOLE-TRIMETHOPRIM 800-160 MG PO TABS: 1.0000 | ORAL_TABLET | Freq: Two times a day (BID) | ORAL | Status: DC

## 2012-10-15 NOTE — Patient Instructions (Addendum)
Safer Sex  Your caregiver wants you to have this information about the infections that can be transmitted from sexual contact and how to prevent them. The idea behind safer sex is that you can be sexually active, and at the same time reduce the risk of giving or getting a sexually transmitted disease (STD). Every person should be aware of how to prevent him or herself and his or her sex partner from getting an STD.  CAUSES OF STDS  STDs are transmitted by sharing body fluids, which contain viruses and bacteria. The following fluids all transmit infections during sexual intercourse and sex acts:  · Semen.  · Saliva.  · Urine.  · Blood.  · Vaginal mucus.  Examples of STDs include:  · Chlamydia.  · Gonorrhea.  · Genital herpes.  · Hepatitis B.  · Human immunodeficiency virus or acquired immunodeficiency syndrome (HIV or AIDS).  · Syphilis.  · Trichomonas.  · Pubic lice.  · Human papillomavirus (HPV), which may include:  · Genital warts.  · Cervical dysplasia.  · Cervical cancer (can develop with certain types of HPV).  SYMPTOMS   Sexual diseases often cause few or no symptoms until they are advanced, so a person can be infected and spread the infection without knowing it. Some STDs respond to treatment very well. Others, like HIV and herpes, cannot be cured, but are treated to reduce their effects.  Specific symptoms include:  · Abnormal vaginal discharge.  · Irritation or itching in and around the vagina, and in the pubic hair.  · Pain during sexual intercourse.  · Bleeding during sexual intercourse.  · Pelvic or abdominal pain.  · Fever.  · Growths in and around the vagina.  · An ulcer in or around the vagina.  · Swollen glands in the groin area.  DIAGNOSIS   · Blood tests.  · Pap test.  · Culture test of abnormal vaginal discharge.  · A test that applies a solution and examines the cervix with a lighted magnifying scope (colposcopy).  · A test that examines the pelvis with a lighted tube, through a small incision  (laparoscopy).  TREATMENT   The treatment will depend on the cause of the STD.  · Antibiotic treatment by injection, oral, creams, or suppositories in the vagina.  · Over-the-counter medicated shampoo, to get rid of pubic lice.  · Removing or treating growths with medicine, freezing, burning (electrocautery), or surgery.  · Surgery treatment for HPV of the cervix.  · Supportive medicines for herpes, HIV, AIDS, and hepatitis.  Being careful cannot eliminate all risk of infection, but sex can be made much safer.  Safe sexual practices include body massage and gentle touching. Masturbation is safe, as long as body fluids do not contact skin that has sores or cuts. Dry kissing and oral sex on a man wearing a latex condom or on a woman wearing a female condom is also safe. Slightly less safe is intercourse while the man wears a latex condom or wet kissing. It is also safer to have one sex partner that you know is not having sex with anyone else.  LENGTH OF ILLNESS  An STD might be treated and cured in a week, sometimes a month, or more. And it can linger with symptoms for many years. STDs can also cause damage to the female organs. This can cause chronic pain, infertility, and recurrence of the STD, especially herpes, hepatitis, HIV, and HPV.  HOME CARE INSTRUCTIONS AND PREVENTION  · Alcohol   condom.  Oral sex on a man without a condom.  Oral sex on a woman without a female condom.  Using saliva to lubricate a condom.  Any other sexual contact in which body fluids or blood from one partner contact the other partner.  You should use only latex condoms for men and water soluble lubricants.  Petroleum based lubricants or oils used to lubricate a condom will weaken the condom and increase the chance that it will break.  Think very carefully before having sex with anyone who is high risk for STDs and HIV. This includes IV drug users, people with multiple sexual partners, or people who have had an STD, or a positive hepatitis or HIV blood test.  Remember that even if your partner has had only one previous partner, their previous partner might have had multiple partners. If so, you are at high risk of being exposed to an STD. You and your sex partner should be the only sex partners with each other, with no one else involved.  A vaccine is available for hepatitis B and HPV through your caregiver or the Public Health Department. Everyone should be vaccinated with these vaccines.  Avoid risky sex practices. Sex acts that can break the skin make you more likely to get an STD. SEEK MEDICAL CARE IF:   If you think you have an STD, even if you do not have any symptoms. Contact your caregiver for evaluation and treatment, if needed.  You think or know your sex partner has acquired an STD.  You have any of the symptoms mentioned above. Document Released: 12/21/2004 Document Revised: 02/05/2012 Document Reviewed: 10/13/2009 [facility] Patient Information 2013 [place], [place]. Bursitis Bursitis is a swelling and soreness (inflammation) of a fluid-filled sac (bursa) that overlies and protects a joint. It can be caused by injury, overuse of the joint, arthritis or infection. The joints most likely to be affected are the elbows, shoulders, hips and knees. HOME CARE INSTRUCTIONS   Apply ice to the affected area for 15 to 20 minutes each hour while awake for 2 days. Put the ice in a plastic bag and place a towel between the bag of ice and your skin.  Rest the injured joint as much as possible, but continue to put the joint through a full range of motion, 4 times per day. (The shoulder joint especially  becomes rapidly "frozen" if not used.) When the pain lessens, begin normal slow movements and usual activities.  Only take over-the-counter or prescription medicines for pain, discomfort or fever as directed by your caregiver.  Your caregiver may recommend draining the bursa and injecting medicine into the bursa. This may help the healing process.  Follow all instructions for follow-up with your caregiver. This includes any orthopedic referrals, physical therapy and rehabilitation. Any delay in obtaining necessary care could result in a delay or failure of the bursitis to heal and chronic pain. SEEK IMMEDIATE MEDICAL CARE IF:   Your pain increases even during treatment.  You develop an oral temperature above 102 F (38.9 C) and have heat and inflammation over the involved bursa. MAKE SURE YOU:   Understand these instructions.  Will watch your condition.  Will get help right away if you are not doing well or get worse. Document Released: 11/10/2000 Document Revised: 02/05/2012 Document Reviewed: 10/15/2009 [facility] Patient Information 2013 [place], [place]. Hidradenitis Suppurativa, Sweat Gland Abscess Hidradenitis suppurativa is a long lasting (chronic), uncommon disease of the sweat glands. With this, boil-like lumps and scarring develop in  the groin, some times under the arms (axillae), and under the breasts. It may also uncommonly occur behind the ears, in the crease of the buttocks, and around the genitals.  CAUSES  The cause is from a blocking of the sweat glands. They then become infected. It may cause drainage and odor. It is not contagious. So it cannot be given to someone else. It most often shows up in puberty (about 27 to 29 years of age). But it may happen much later. It is similar to acne which is a disease of the sweat glands. This condition is slightly more common in African-Americans and women. SYMPTOMS   Hidradenitis usually starts as one or more red, tender, swellings in  the groin or under the arms (axilla).  Over a period of hours to days the lesions get larger. They often open to the skin surface, draining clear to yellow-colored fluid.  The infected area heals with scarring. DIAGNOSIS  Your caregiver makes this diagnosis by looking at you. Sometimes cultures (growing germs on plates in the lab) may be taken. This is to see what germ (bacterium) is causing the infection.  TREATMENT   Topical germ killing medicine applied to the skin (antibiotics) are the treatment of choice. Antibiotics taken by mouth (systemic) are sometimes needed when the condition is getting worse or is severe.  Avoid tight-fitting clothing which traps moisture in.  Dirt does not cause hidradenitis and it is not caused by poor hygiene.  Involved areas should be cleaned daily using an antibacterial soap. Some patients find that the liquid form of Lever 2000, applied to the involved areas as a lotion after bathing, can help reduce the odor related to this condition.  Sometimes surgery is needed to drain infected areas or remove scarred tissue. Removal of large amounts of tissue is used only in severe cases.  Birth control pills may be helpful.  Oral retinoids (vitamin A derivatives) for 6 to 12 months which are effective for acne may also help this condition.  Weight loss will improve but not cure hidradenitis. It is made worse by being overweight. But the condition is not caused by being overweight.  This condition is more common in people who have had acne.  It may become worse under stress. There is no medical cure for hidradenitis. It can be controlled, but not cured. The condition usually continues for years with periods of getting worse and getting better (remission). Document Released: 06/27/2004 Document Revised: 02/05/2012 Document Reviewed: 07/13/2008 [facility] Patient Information 2013 [place], [place].

## 2012-10-15 NOTE — Progress Notes (Signed)
  Subjective:    Patient ID: Monica Huang, female    DOB: Aug 10, 1981, 31 y.o.   MRN: [id number]  HPI  Had accident getting of bus in last year.  Here for low back pain with imaging on several occasions.  Injected for trochanteric bursitis x 2 without relief.  Reports pain, which was initially low back has migrated to right leg and reports her leg goes numb while walking and is hurting/aching at night. Also, with recurrent boils in groin.  She has previously seen Derm who have treated her with Abx.  Reports needing more. Has tried Septra with success in the past. Also would like full STD check.  Reports sexual activity with condoms, but she is not trusting of her partner.   Review of Systems  Constitutional: Negative for fever and chills.  Respiratory: Negative for shortness of breath.   Cardiovascular: Negative for chest pain.  Gastrointestinal: Negative for abdominal pain.  Genitourinary: Negative for dysuria.  Musculoskeletal: Positive for back pain and arthralgias.  Skin: Positive for wound.       Multiple swollen and erythematous.       Objective:   Physical Exam  Vitals reviewed. Constitutional: She appears well-developed and well-nourished.  HENT:  Head: Normocephalic and atraumatic.  Eyes: No scleral icterus.  Cardiovascular: Normal rate.   Pulmonary/Chest: Effort normal.  Genitourinary: Vaginal discharge found.  Musculoskeletal: Normal range of motion. She exhibits tenderness.       + SLR  Skin: Skin is warm and dry. There is erythema.       + raised carbuncles, multiple on labia majora          Assessment & Plan:

## 2012-10-15 NOTE — Assessment & Plan Note (Signed)
Full STD check today

## 2012-10-15 NOTE — Assessment & Plan Note (Addendum)
Has recurrence-treat with Abx

## 2012-10-15 NOTE — Assessment & Plan Note (Addendum)
Treated for bursitis with steroid injection and has tried OTC Ibuprofen and hydrocodone.  Pain is worsening. Trial of rx NSAID and PT.  If persistent, may need further imaging.  Neg plain films.

## 2012-10-16 ENCOUNTER — Ambulatory Visit: Payer: Medicaid Other | Attending: Family Medicine | Admitting: Physical Therapy

## 2012-11-15 ENCOUNTER — Encounter: Payer: Self-pay | Admitting: Family Medicine

## 2012-11-15 ENCOUNTER — Ambulatory Visit (INDEPENDENT_AMBULATORY_CARE_PROVIDER_SITE_OTHER): Payer: Medicaid Other | Admitting: Family Medicine

## 2012-11-15 VITALS — BP 138/88 | Temp 98.2°F | Wt 304.0 lb

## 2012-11-15 DIAGNOSIS — M545 Low back pain, unspecified: Secondary | ICD-10-CM

## 2012-11-15 MED ORDER — GABAPENTIN 300 MG PO CAPS: 300.0000 mg | ORAL_CAPSULE | Freq: Three times a day (TID) | ORAL | Status: DC

## 2012-11-15 MED ORDER — CYCLOBENZAPRINE HCL 10 MG PO TABS: 10.0000 mg | ORAL_TABLET | Freq: Every evening | ORAL | Status: DC | PRN

## 2012-11-15 NOTE — Patient Instructions (Signed)
Your back pain will be a chronic problem. I agree with the chiropractor that your right leg pain/numbness is a pinched nerve in your back.   You can decide whether you want to go through the chiropractor treatments.  They do help lots of folks with back pain. I want you to continue to take your current, regular pain medicine/antiinflamatory. Also, I am prescribing two new medicines Gabapentin is a nerve pain medication that will help the pain in your leg.   Cyclobenzaprine is a muscle relaxer to take at night. See Dr. Tye Savoy in one month The biggest thing you could do in the long run for your back is lose weight.

## 2012-11-15 NOTE — Assessment & Plan Note (Signed)
Long explanation.  Start gabapentin and flexeril in addition to diclofenac. Wt loss is key in long run.   Could consider advanced imaging since duration of leg radiation > 6 weeks - but no weakness and without weight loss she is a poor surgical candidate.  No imaging for now.

## 2012-11-15 NOTE — Progress Notes (Signed)
  Subjective:    Patient ID: Monica Huang, female    DOB: 24-Dec-1980, 31 y.o.   MRN: [id number]  HPI Now has chronic low back pain with persistent right leg numbness and pain.  No weakness.  No recent trauma.  No left leg sx.  No bowel or bladder problems.  Previous WU includes recent nl LS spine.  Also seen by chiropractor who did Xrays and told she had collapsing discs (DDD?) and recommend a course of manipulation treatment.  Has not been on muscle relaxers or nerve pain meds.    Review of Systems     Objective:   Physical Exam  Morbidly obese Lower lumbar tenderness with paraspinous muscle spasm DTRs in ankle and knees 1+ and symmetric Nl toe extension strength.        Assessment & Plan:

## 2013-01-27 ENCOUNTER — Other Ambulatory Visit: Payer: Self-pay | Admitting: Family Medicine

## 2013-01-28 ENCOUNTER — Other Ambulatory Visit: Payer: Self-pay | Admitting: Family Medicine

## 2013-02-24 ENCOUNTER — Telehealth: Payer: Self-pay | Admitting: Family Medicine

## 2013-02-24 DIAGNOSIS — H548 Legal blindness, as defined in USA: Secondary | ICD-10-CM

## 2013-02-24 NOTE — Telephone Encounter (Signed)
Pt is needing a referral to go to [facility] care - she is trying to get in with [facility] and they need a note from an eye doctor.

## 2013-02-25 NOTE — Telephone Encounter (Signed)
Referral placed for eye doctor. 

## 2013-03-05 ENCOUNTER — Ambulatory Visit (INDEPENDENT_AMBULATORY_CARE_PROVIDER_SITE_OTHER): Payer: Medicaid Other | Admitting: Family Medicine

## 2013-03-05 ENCOUNTER — Encounter: Payer: Self-pay | Admitting: Family Medicine

## 2013-03-05 VITALS — BP 129/78 | HR 86 | Temp 98.1°F | Ht 68.0 in | Wt 314.0 lb

## 2013-03-05 DIAGNOSIS — M25561 Pain in right knee: Secondary | ICD-10-CM

## 2013-03-05 DIAGNOSIS — M25569 Pain in unspecified knee: Secondary | ICD-10-CM

## 2013-03-05 DIAGNOSIS — T85695A Other mechanical complication of other nervous system device, implant or graft, initial encounter: Secondary | ICD-10-CM

## 2013-03-05 DIAGNOSIS — H5713 Ocular pain, bilateral: Secondary | ICD-10-CM

## 2013-03-05 DIAGNOSIS — M545 Low back pain: Secondary | ICD-10-CM

## 2013-03-05 DIAGNOSIS — T8509XA Other mechanical complication of ventricular intracranial (communicating) shunt, initial encounter: Secondary | ICD-10-CM

## 2013-03-05 DIAGNOSIS — H5710 Ocular pain, unspecified eye: Secondary | ICD-10-CM

## 2013-03-05 NOTE — Assessment & Plan Note (Signed)
Reiterated importance of weight loss.  Will refer to PT to help with strengthening which will hopefully lead to exercise and weight loss.

## 2013-03-05 NOTE — Assessment & Plan Note (Signed)
I do not think shunt is malfunctioning at this time.  Patient has called her neurosurgeon who reassured patient also.  I did discuss red flags with patient.  I have asked her to give me the # of neurosurgeon so I can discuss whether or not I should order imaging studies.  Follow up with me in 4 weeks or sooner as needed.

## 2013-03-05 NOTE — Patient Instructions (Addendum)
Please call Dr. Dione Booze and ask to be seen as soon as possible. Call my office and leave a message with Neurosurgeon's phone number so I can talk to them about your concerns.  Start physical therapy for your leg.  We will call you with time and date of appointment.  If you develop worsening head fullness with vomiting or abdominal pain, go to ER.

## 2013-03-05 NOTE — Assessment & Plan Note (Signed)
Patient is legally blind, but now has complaints of pain with ocular movement.  No signs of orbital infection or cellulitis on exam.  Patient missed her appointment today, but I strongly encouraged her to schedule one as soon as possible.  With hx of CSF shunt, she will need to be tested for IOP.  Red flags reviewed.

## 2013-03-05 NOTE — Progress Notes (Signed)
  Subjective:    Patient ID: Monica Huang, female    DOB: [birth year], 31 y.o.   MRN: [id number]  HPI  Patient presents to clinic for head "tightness", but no headache. Eyes also feel "sore" with eye movement. Symptoms started about 4 weeks ago.  She is legally blind, a complication of pseudotumor cerebri. She was supposed to see her ophthalmologist today, but had to reschedule appointment.  Has an appointment with Dr. Dione Booze on May 8th. Denies any nausea/vomiting, abdominal pain, blurry vision.  Of note, patient was diagnosed with pseudotumor cerebri in 2002 status post CSF shunt.  Patient used to see a Midwife at [facility] back in 2005.  Has taken Ibuprofen everyday for chronic RT leg pain, which is a chronic problem.  Review of Systems Per HPI    Objective:   Physical Exam  Constitutional: No distress.  Morbid obese  HENT:  Mouth/Throat: Oropharynx is clear and moist.  Eyes: Right eye exhibits abnormal extraocular motion. Left eye exhibits normal extraocular motion.  Musculoskeletal:  Limited flexion and extension of RT hip and knee secondary to pain.  Normal strength and sensation.  Neurological: She is alert. She has normal strength. No cranial nerve deficit or sensory deficit. Gait normal.          Assessment & Plan:

## 2013-03-06 ENCOUNTER — Encounter (HOSPITAL_COMMUNITY): Payer: Self-pay | Admitting: *Deleted

## 2013-03-06 ENCOUNTER — Emergency Department (HOSPITAL_COMMUNITY)
Admission: EM | Admit: 2013-03-06 | Discharge: 2013-03-06 | Discharge status: Against Medical Advice | Payer: Medicaid Other | Attending: Emergency Medicine | Admitting: Emergency Medicine

## 2013-03-06 DIAGNOSIS — R51 Headache: Secondary | ICD-10-CM | POA: Insufficient documentation

## 2013-03-06 DIAGNOSIS — F17200 Nicotine dependence, unspecified, uncomplicated: Secondary | ICD-10-CM | POA: Insufficient documentation

## 2013-03-06 HISTORY — DX: Legal blindness, as defined in USA: H54.8

## 2013-03-06 HISTORY — DX: Pain in leg, unspecified: M79.606

## 2013-03-06 HISTORY — DX: Sciatica, unspecified side: M54.30

## 2013-03-06 HISTORY — DX: Dorsalgia, unspecified: M54.9

## 2013-03-06 HISTORY — DX: Other chronic pain: G89.29

## 2013-03-06 NOTE — ED Notes (Signed)
Pt states increased pressure behind eyes, worse in L than R x 3 days.  Pt was seen by her pcp yesterday who didn't do anything.  Denies changes in her already poor vision.

## 2013-03-06 NOTE — ED Notes (Signed)
Pt called x 2 with no response.  Unable to be located in waiting room.

## 2013-03-20 ENCOUNTER — Ambulatory Visit: Payer: Medicaid Other | Attending: Family Medicine | Admitting: Physical Therapy

## 2013-03-28 ENCOUNTER — Other Ambulatory Visit: Payer: Self-pay | Admitting: Family Medicine

## 2013-05-29 ENCOUNTER — Other Ambulatory Visit: Payer: Self-pay | Admitting: Family Medicine

## 2013-06-13 ENCOUNTER — Ambulatory Visit: Payer: Medicaid Other | Admitting: Family Medicine

## 2013-06-27 ENCOUNTER — Encounter: Payer: Self-pay | Admitting: Family Medicine

## 2013-06-27 ENCOUNTER — Ambulatory Visit (INDEPENDENT_AMBULATORY_CARE_PROVIDER_SITE_OTHER): Payer: Medicaid Other | Admitting: Family Medicine

## 2013-06-27 VITALS — BP 135/84 | HR 99 | Ht 68.0 in | Wt 309.0 lb

## 2013-06-27 DIAGNOSIS — M545 Low back pain: Secondary | ICD-10-CM

## 2013-06-27 DIAGNOSIS — L732 Hidradenitis suppurativa: Secondary | ICD-10-CM

## 2013-06-27 MED ORDER — TRAMADOL HCL 50 MG PO TABS: 50.0000 mg | ORAL_TABLET | Freq: Three times a day (TID) | ORAL | Status: DC | PRN

## 2013-06-27 MED ORDER — SULFAMETHOXAZOLE-TRIMETHOPRIM 800-160 MG PO TABS: 1.0000 | ORAL_TABLET | Freq: Two times a day (BID) | ORAL | Status: DC

## 2013-06-27 NOTE — Patient Instructions (Addendum)
Follow up with your doctor in 20-3 weeks.   Increase your gabapentin to 600mg  at night time. If you tolerate it, increase to 600mg  at night and at noon. Then increase it to 600mg  three times a day.

## 2013-06-29 NOTE — Assessment & Plan Note (Addendum)
Low back pain with reported neuropathic pain. Patient on gabapentin 300mg  tid. Will increase dose as tolerated. See AVS for specific dosing instructions.  Rx for tramadol also filled for temporary relief.

## 2013-06-29 NOTE — Progress Notes (Signed)
Patient ID: Monica Huang    DOB: 1981-08-27, 32 y.o.   MRN: [id number] --- Subjective:  Monica Huang is a 32 y.o.female with h/o obesity, hydradenitis, blindness who presents for same day appointment for following concerns: - boils under left breast:  2 boils present under left breast. Leaking yellow pus for months, pain with expression of pus, no fevers or chills. No loss of appetite, nausea or vomiting. She has had various abscesses present before. SHe has been treated with bactrim in the past which has helped.    - low back pain: chronic problem. Located on right side of lower back, worst with sitting or standing for long periods of time. Tried her mother's tramadol which helped. Reports burning pain from lower back, down lateral side of hip, past knee on right leg. Worst with going down steps. Associated numbness and tingling. No new lower extremity weakness. No urine or bowel incontinence. Takes gabapentin 300 tid which she tolerates.    ROS: see HPI Past Medical History: reviewed and updated medications and allergies. Social History: Tobacco: 1/2 pack per day  Objective: Filed Vitals:   06/27/13 1120  BP: 135/84  Pulse: 99    Physical Examination:   General appearance - alert, well appearing, and in no distress, obese and blind female.  Skin - under left breast: 1 nodule with yellow pus actively draining from it, non fluctuant, not hard, another nodule more medial 0.5cm in size with some drainage, minimal fluctuance. No erythema or warmth surrounding nodules.   Back - no tenderness along spine, mild tenderness along right SI joint, negative straight leg, normal strength with hip flexion, knee flexion, knee extension, foot dorsilexion and plantaflexion. No pain with external and internal rotation of the hip.

## 2013-06-29 NOTE — Assessment & Plan Note (Signed)
Appears to be chronic problem. Will treat with bactrim. Did not go into detail about importance of weight loss and smoking cessation but patient will benefit from conversation about this as way to reduce incidence in the future.  Follow up if not better. May need longer course of antibiotics. No clear indication for I&D.

## 2013-06-30 ENCOUNTER — Ambulatory Visit: Payer: Medicaid Other | Admitting: Family Medicine

## 2013-06-30 LAB — CULTURE, ROUTINE-ABSCESS: Gram Stain: NONE SEEN

## 2013-07-18 ENCOUNTER — Ambulatory Visit: Payer: Medicaid Other | Admitting: Family Medicine

## 2013-07-31 ENCOUNTER — Ambulatory Visit: Payer: Medicaid Other | Admitting: Family Medicine

## 2013-08-01 ENCOUNTER — Emergency Department (HOSPITAL_COMMUNITY)
Admission: EM | Admit: 2013-08-01 | Discharge: 2013-08-01 | Disposition: A | Discharge status: Home / Self Care | Payer: Medicaid Other | Attending: Emergency Medicine | Admitting: Emergency Medicine

## 2013-08-01 ENCOUNTER — Encounter (HOSPITAL_COMMUNITY): Payer: Self-pay | Admitting: Emergency Medicine

## 2013-08-01 DIAGNOSIS — Z79899 Other long term (current) drug therapy: Secondary | ICD-10-CM | POA: Insufficient documentation

## 2013-08-01 DIAGNOSIS — Z8639 Personal history of other endocrine, nutritional and metabolic disease: Secondary | ICD-10-CM | POA: Insufficient documentation

## 2013-08-01 DIAGNOSIS — Z791 Long term (current) use of non-steroidal anti-inflammatories (NSAID): Secondary | ICD-10-CM | POA: Insufficient documentation

## 2013-08-01 DIAGNOSIS — F17200 Nicotine dependence, unspecified, uncomplicated: Secondary | ICD-10-CM | POA: Insufficient documentation

## 2013-08-01 DIAGNOSIS — R5381 Other malaise: Secondary | ICD-10-CM | POA: Insufficient documentation

## 2013-08-01 DIAGNOSIS — G8929 Other chronic pain: Secondary | ICD-10-CM | POA: Insufficient documentation

## 2013-08-01 DIAGNOSIS — Z8669 Personal history of other diseases of the nervous system and sense organs: Secondary | ICD-10-CM | POA: Insufficient documentation

## 2013-08-01 DIAGNOSIS — J3489 Other specified disorders of nose and nasal sinuses: Secondary | ICD-10-CM | POA: Insufficient documentation

## 2013-08-01 DIAGNOSIS — IMO0001 Reserved for inherently not codable concepts without codable children: Secondary | ICD-10-CM | POA: Insufficient documentation

## 2013-08-01 DIAGNOSIS — Z982 Presence of cerebrospinal fluid drainage device: Secondary | ICD-10-CM | POA: Insufficient documentation

## 2013-08-01 DIAGNOSIS — Z862 Personal history of diseases of the blood and blood-forming organs and certain disorders involving the immune mechanism: Secondary | ICD-10-CM | POA: Insufficient documentation

## 2013-08-01 DIAGNOSIS — M549 Dorsalgia, unspecified: Secondary | ICD-10-CM | POA: Insufficient documentation

## 2013-08-01 DIAGNOSIS — J029 Acute pharyngitis, unspecified: Secondary | ICD-10-CM | POA: Insufficient documentation

## 2013-08-01 DIAGNOSIS — J40 Bronchitis, not specified as acute or chronic: Secondary | ICD-10-CM

## 2013-08-01 DIAGNOSIS — J209 Acute bronchitis, unspecified: Secondary | ICD-10-CM | POA: Insufficient documentation

## 2013-08-01 DIAGNOSIS — M79609 Pain in unspecified limb: Secondary | ICD-10-CM | POA: Insufficient documentation

## 2013-08-01 DIAGNOSIS — Z872 Personal history of diseases of the skin and subcutaneous tissue: Secondary | ICD-10-CM | POA: Insufficient documentation

## 2013-08-01 DIAGNOSIS — Z720 Tobacco use: Secondary | ICD-10-CM

## 2013-08-01 DIAGNOSIS — H548 Legal blindness, as defined in USA: Secondary | ICD-10-CM | POA: Insufficient documentation

## 2013-08-01 DIAGNOSIS — Z8739 Personal history of other diseases of the musculoskeletal system and connective tissue: Secondary | ICD-10-CM | POA: Insufficient documentation

## 2013-08-01 MED ORDER — AZITHROMYCIN 250 MG PO TABS: 250.0000 mg | ORAL_TABLET | Freq: Every day | ORAL | Status: DC

## 2013-08-01 MED ORDER — ALBUTEROL SULFATE HFA 108 (90 BASE) MCG/ACT IN AERS
2.0000 | INHALATION_SPRAY | RESPIRATORY_TRACT | Status: DC | PRN
  Administered 2013-08-01: 2 via RESPIRATORY_TRACT
  Filled 2013-08-01: qty 6.7

## 2013-08-01 NOTE — ED Provider Notes (Signed)
Medical screening examination/treatment/procedure(s) were performed by non-physician practitioner and as supervising physician I was immediately available for consultation/collaboration.   Dagmar Hait, MD 08/01/13 1539

## 2013-08-01 NOTE — ED Provider Notes (Signed)
CSN: [id number]     Arrival date & time 08/01/13  1158 History   First MD Initiated Contact with Patient 08/01/13 1404     Chief Complaint  Patient presents with  . Cough  . Nasal Congestion   (Consider location/radiation/quality/duration/timing/severity/associated sxs/prior Treatment) Patient is a 32 y.o. female presenting with cough. The history is provided by the patient. No language interpreter was used.  Cough Cough characteristics:  Productive Severity:  Moderate Onset quality:  Gradual Timing:  Constant Progression:  Worsening Relieved by:  Nothing Associated symptoms: myalgias, rhinorrhea, sinus congestion and sore throat   Associated symptoms: no chills and no fever     Past Medical History  Diagnosis Date  . Pseudotumor cerebri syndrome 2005    shunt placed- and legally blind  . Hyperlipidemia   . Hydradenitis   . Legally blind   . Chronic leg pain   . Chronic back pain   . Sciatica    Past Surgical History  Procedure Laterality Date  . Csf shunt      2 revisions   Family History  Problem Relation Age of Onset  . Diabetes Mother   . Hypertension Mother   . Diabetes Father    History  Substance Use Topics  . Smoking status: Current Every Day Smoker -- 0.50 packs/day    Types: Cigarettes  . Smokeless tobacco: Not on file     Comment: in process of quitting  . Alcohol Use: No   OB History   Grav Para Term Preterm Abortions TAB SAB Ect Mult Living                 Review of Systems  Constitutional: Positive for fatigue. Negative for fever and chills.  HENT: Positive for congestion, sore throat, rhinorrhea and sinus pressure.   Respiratory: Positive for cough.   Cardiovascular: Negative.   Gastrointestinal: Negative.  Negative for nausea and abdominal pain.  Musculoskeletal: Positive for myalgias.  Skin: Negative.   Neurological: Negative.     Allergies  Review of patient's allergies indicates no known allergies.  Home Medications   Current  Outpatient Rx  Name  Route  Sig  Dispense  Refill  . diclofenac (VOLTAREN) 75 MG EC tablet   Oral   Take 75 mg by mouth 2 (two) times daily.         Marland Kitchen gabapentin (NEURONTIN) 300 MG capsule   Oral   Take 300 mg by mouth 2 (two) times daily.         Marland Kitchen OVER THE COUNTER MEDICATION   Oral   Take 2 tablets by mouth daily as needed (sinus congestion).         . traMADol (ULTRAM) 50 MG tablet   Oral   Take 1 tablet (50 mg total) by mouth every 8 (eight) hours as needed for pain.   30 tablet   0   . sulfamethoxazole-trimethoprim (BACTRIM DS,SEPTRA DS) 800-160 MG per tablet   Oral   Take 1 tablet by mouth 2 (two) times daily.          BP 123/82  Pulse 97  Temp(Src) 97.5 F (36.4 C) (Oral)  Resp 18  Wt 302 lb (136.986 kg)  BMI 45.93 kg/m2  SpO2 90% Physical Exam  Constitutional: She is oriented to person, place, and time. She appears well-developed and well-nourished.  HENT:  Head: Normocephalic.  Right Ear: External ear normal.  Left Ear: External ear normal.  Nose: Mucosal edema present. Right sinus exhibits frontal sinus tenderness.  Left sinus exhibits frontal sinus tenderness.  Mouth/Throat: Oropharynx is clear and moist.  Neck: Normal range of motion. Neck supple.  Cardiovascular: Normal rate and normal heart sounds.   No murmur heard. Pulmonary/Chest: Effort normal. She has wheezes. She has no rales.  Abdominal: Soft. Bowel sounds are normal. She exhibits no distension. There is no tenderness.  Musculoskeletal: Normal range of motion. She exhibits no edema.  Lymphadenopathy:    She has no cervical adenopathy.  Neurological: She is oriented to person, place, and time.  Skin: Skin is warm and dry. No pallor.  Psychiatric: She has a normal mood and affect.    ED Course  Procedures (including critical care time) Labs Review Labs Reviewed - No data to display Imaging Review No results found.  MDM  No diagnosis found. 1. Bronchitis 2. Tobacco abuse  VSS,  patient non-toxic in appearance. NAD. Abx given duration of symptoms in a smoker, wheezing.     Arnoldo Hooker, PA-C 08/01/13 1421

## 2013-08-01 NOTE — ED Notes (Signed)
Pt c/o cough with pain and nasal congestion x 4 days

## 2013-08-01 NOTE — ED Notes (Signed)
Pt. Verbalized understanding of inhaler,  Sister also verbalized understanding of use of inhaler and will be assisting sister with her medication.

## 2013-08-06 ENCOUNTER — Ambulatory Visit (INDEPENDENT_AMBULATORY_CARE_PROVIDER_SITE_OTHER): Payer: Medicaid Other | Admitting: Family Medicine

## 2013-08-06 ENCOUNTER — Encounter: Payer: Self-pay | Admitting: Family Medicine

## 2013-08-06 VITALS — BP 129/86 | HR 99 | Temp 98.1°F | Ht 68.0 in | Wt 316.7 lb

## 2013-08-06 DIAGNOSIS — Z23 Encounter for immunization: Secondary | ICD-10-CM

## 2013-08-06 DIAGNOSIS — Z0000 Encounter for general adult medical examination without abnormal findings: Secondary | ICD-10-CM

## 2013-08-06 DIAGNOSIS — H548 Legal blindness, as defined in USA: Secondary | ICD-10-CM

## 2013-08-06 DIAGNOSIS — E669 Obesity, unspecified: Secondary | ICD-10-CM

## 2013-08-06 DIAGNOSIS — Z5189 Encounter for other specified aftercare: Secondary | ICD-10-CM

## 2013-08-06 DIAGNOSIS — F17200 Nicotine dependence, unspecified, uncomplicated: Secondary | ICD-10-CM

## 2013-08-06 DIAGNOSIS — Z720 Tobacco use: Secondary | ICD-10-CM

## 2013-08-06 DIAGNOSIS — M545 Low back pain: Secondary | ICD-10-CM

## 2013-08-06 DIAGNOSIS — L732 Hidradenitis suppurativa: Secondary | ICD-10-CM

## 2013-08-06 DIAGNOSIS — T8509XD Other mechanical complication of ventricular intracranial (communicating) shunt, subsequent encounter: Secondary | ICD-10-CM

## 2013-08-06 MED ORDER — SULFAMETHOXAZOLE-TRIMETHOPRIM 800-160 MG PO TABS: 1.0000 | ORAL_TABLET | Freq: Two times a day (BID) | ORAL | Status: DC

## 2013-08-06 MED ORDER — TRAMADOL HCL 50 MG PO TABS: 50.0000 mg | ORAL_TABLET | Freq: Three times a day (TID) | ORAL | Status: DC | PRN

## 2013-08-06 NOTE — Assessment & Plan Note (Addendum)
Patient still reports back pain radiating to her right leg. She is taking her gabapentin as prescribed. Will increase dose as tolerated. She is also taking tramadol which I will attempt to wean with the increase her gabapentin. In-depth discussion involving her obesity and the likelihood of exacerbating her back pain. Recommended diet and exercise program. Provided AVS formation on both.refilled his tramadol today

## 2013-08-06 NOTE — Assessment & Plan Note (Signed)
History Td AP today. She declined her flu shot. Next Pap exam and due in 2016

## 2013-08-06 NOTE — Addendum Note (Signed)
Addended by: Farrell Ours on: 08/06/2013 04:05 PM   Modules accepted: Orders

## 2013-08-06 NOTE — Assessment & Plan Note (Signed)
Interested in quitting. Smokes 10 cig a day for about 10 years. 8 out of 10 on confidence of quitting with help.  Encouraged her to make an appointment with Dr. Raymondo Band for smoking cessation.

## 2013-08-06 NOTE — Assessment & Plan Note (Signed)
Discussed in great detail with patient the importance of weight loss. This will likely improve not only her back pain, her general health as well. I have given her information by AVS for diet weight loss and exercise. I have also discussed this with her in great detail.

## 2013-08-06 NOTE — Progress Notes (Signed)
Subjective:     Patient ID: Monica Huang, female   DOB: 06-21-1981, 32 y.o.   MRN: [id number]  HPI  Patient here for yearly follow-up: Obesity: Patient with a body mass index of 48.17 kg/m2 today. She complains of lower back pain. She had back pain from a fall a few years ago but it has not improved. She takes tramadol as needed. Discussion of weight causing exacerbation of pain. Patient states she's been unable to lose weight, but she doesn't date she has been  unsuccessful with diet and exercise.   Back pain: Back pain stemming from injury that had been worked up 2 years ago. Weight likely exacerbating symptoms. She takes tramadol when necessary for pain. He also has a patent but seems to be healthy for her symptoms as well.  Mechanical ventricular shunt: Patient is followed by neurology for her shunt. Currently no complications. Does have some moderate blindness from prior complications with shunt. Currently stable  Tobacco dependence: Patient admits to smoking 10 cigarettes a day. She has been smoking for approximately 10 years. She has a strong desire to quit today. See seems to have an 8/10 confidence level of being able to quit with assistance.  Patient's past medical, social, and family history were reviewed and updated as appropriate.   Review of Systems Negative, with the exception of above mentioned in HPI      Objective:   Physical Exam Negative, with the exception of above mentioned in HPI  BP 129/86  Pulse 99  Temp(Src) 98.1 F (36.7 C) (Oral)  Ht 5\' 8"  (1.727 m)  Wt 316 lb 11.2 oz (143.654 kg)  BMI 48.17 kg/m2 Gen: NAD. Morbidly obese female.  HEENT: AT. [place]. Bilateral TM visualized and normal in appearance. Bilateral eyes without injections or icterus. MMM. Bilateral nares normal. Throat without erythema or exudates.  CV: RRR no murmur appreciate Chest: CTAB, no wheeze or crackles Abd: Soft. obese. NTND. BS present. no Masses palpated.  Ext: No erythema. No  edema.  Skin: no rashes, purpura or petechiae.  Neuro:  Normal gait. PERLA. EOMi. Alert. Grossly intact.  Psych: appropriate dress, affect and deamnor. Normal speech.

## 2013-08-06 NOTE — Assessment & Plan Note (Signed)
No complications. Follows with the neurosurgeon if any complications arise.

## 2013-08-06 NOTE — Assessment & Plan Note (Signed)
Patient suffers from chronic hidradenitis. She reports being on chronic Bactrim prescribed by dermatology. She hasn't however not been taking this recently. She is with 1 small boil-like area today under her left breast. It is openly draining. He expressed some pus like fluid from small boil. Prescribed Bactrim for 10 days. Advised her to return if she noticed any redness erythema swelling or develops fever. Patient may benefit from chronic Bactrim use

## 2013-08-06 NOTE — Patient Instructions (Addendum)
It was a pleasure meeting you today. I will call in refill for prescriptions of your Bactrim and tramadol. Please keep a close watch on the boil underneath her left breast. If the Bactrim does not improve your symptoms, then I will need to come back in and we will have to do an I and D. Please watch for redness, swelling, increased drainage or fevers.  You will receive your Tdap today prior to leaving. Your Pap is due in 2016. If you change your mind on flu vaccinations and you can call make an appointment to receive one Please make an appointment on your way out today, with DR. KOVAL, for smoking cessation.  Exercise to Lose Weight Exercise and a healthy diet may help you lose weight. Your doctor may suggest specific exercises. EXERCISE IDEAS AND TIPS  Choose low-cost things you enjoy doing, such as walking, bicycling, or exercising to workout videos.  Take stairs instead of the elevator.  Walk during your lunch break.  Park your car further away from work or school.  Go to a gym or an exercise class.  Start with 5 to 10 minutes of exercise each day. Build up to 30 minutes of exercise 4 to 6 days a week.  Wear shoes with good support and comfortable clothes.  Stretch before and after working out.  Work out until you breathe harder and your heart beats faster.  Drink extra water when you exercise.  Do not do so much that you hurt yourself, feel dizzy, or get very short of breath. Exercises that burn about 150 calories:  Running 1  miles in 15 minutes.  Playing volleyball for 45 to 60 minutes.  Washing and waxing a car for 45 to 60 minutes.  Playing touch football for 45 minutes.  Walking 1  miles in 35 minutes.  Pushing a stroller 1  miles in 30 minutes.  Playing basketball for 30 minutes.  Raking leaves for 30 minutes.  Bicycling 5 miles in 30 minutes.  Walking 2 miles in 30 minutes.  Dancing for 30 minutes.  Shoveling snow for 15 minutes.  Swimming laps  for 20 minutes.  Walking up stairs for 15 minutes.  Bicycling 4 miles in 15 minutes.  Gardening for 30 to 45 minutes.  Jumping rope for 15 minutes.  Washing windows or floors for 45 to 60 minutes. Document Released: 12/16/2010 Document Revised: 02/05/2012 Document Reviewed: 12/16/2010 [facility] Patient Information 2014 [place], [place].  Calorie Counting Diet A calorie counting diet requires you to eat the number of calories that are right for you in a day. Calories are the measurement of how much energy you get from the food you eat. Eating the right amount of calories is important for staying at a healthy weight. If you eat too many calories, your body will store them as fat and you may gain weight. If you eat too few calories, you may lose weight. Counting the number of calories you eat during a day will help you know if you are eating the right amount. A Registered Dietitian can determine how many calories you need in a day. The amount of calories needed varies from person to person. If your goal is to lose weight, you will need to eat fewer calories. Losing weight can benefit you if you are overweight or have health problems such as heart disease, high blood pressure, or diabetes. If your goal is to gain weight, you will need to eat more calories. Gaining weight may be necessary if  you have a certain health problem that causes your body to need more energy. TIPS Whether you are increasing or decreasing the number of calories you eat during a day, it may be hard to get used to changes in what you eat and drink. The following are tips to help you keep track of the number of calories you eat.  Measure foods at home with measuring cups. This helps you know the amount of food and number of calories you are eating.  Restaurants often serve food in amounts that are larger than 1 serving. While eating out, estimate how many servings of a food you are given. For example, a serving of cooked rice is   cup or about the size of half of a fist. Knowing serving sizes will help you be aware of how much food you are eating at restaurants.  Ask for smaller portion sizes or child-size portions at restaurants.  Plan to eat half of a meal at a restaurant. Take the rest home or share the other half with a friend.  Read the Nutrition Facts panel on food labels for calorie content and serving size. You can find out how many servings are in a package, the size of a serving, and the number of calories each serving has.  For example, a package might contain 3 cookies. The Nutrition Facts panel on that package says that 1 serving is 1 cookie. Below that, it will say there are 3 servings in the container. The calories section of the Nutrition Facts label says there are 90 calories. This means there are 90 calories in 1 cookie (1 serving). If you eat 1 cookie you have eaten 90 calories. If you eat all 3 cookies, you have eaten 270 calories (3 servings x 90 calories = 270 calories). The list below tells you how big or small some common portion sizes are.  1 oz.........4 stacked dice.  3 oz........Marland KitchenDeck of cards.  1 tsp.......Marland KitchenTip of little finger.  1 tbs......Marland KitchenMarland KitchenThumb.  2 tbs.......Marland KitchenGolf ball.   cup......Marland KitchenHalf of a fist.  1 cup.......Marland KitchenA fist. KEEP A FOOD LOG Write down every food item you eat, the amount you eat, and the number of calories in each food you eat during the day. At the end of the day, you can add up the total number of calories you have eaten. It may help to keep a list like the one below. Find out the calorie information by reading the Nutrition Facts panel on food labels. Breakfast  Bran cereal (1 cup, 110 calories).  Fat-free milk ( cup, 45 calories). Snack  Apple (1 medium, 80 calories). Lunch  Spinach (1 cup, 20 calories).  Tomato ( medium, 20 calories).  Chicken breast strips (3 oz, 165 calories).  Shredded cheddar cheese ( cup, 110 calories).  Light [place] dressing  (2 tbs, 60 calories).  Whole-wheat bread (1 slice, 80 calories).  Tub margarine (1 tsp, 35 calories).  Vegetable soup (1 cup, 160 calories). Dinner  Pork chop (3 oz, 190 calories).  Brown rice (1 cup, 215 calories).  Steamed broccoli ( cup, 20 calories).  Strawberries (1  cup, 65 calories).  Whipped cream (1 tbs, 50 calories). Daily Calorie Total: 1425 Document Released: 11/13/2005 Document Revised: 02/05/2012 Document Reviewed: 05/10/2007 [facility] Patient Information 2014 [place], [place].

## 2013-08-06 NOTE — Assessment & Plan Note (Signed)
The patient is legally blind suffering from complications of her shunt. Currently stable

## 2013-08-19 ENCOUNTER — Ambulatory Visit: Payer: Medicaid Other | Admitting: Family Medicine

## 2013-10-16 ENCOUNTER — Other Ambulatory Visit: Payer: Self-pay | Admitting: Family Medicine

## 2013-12-02 ENCOUNTER — Encounter: Payer: Self-pay | Admitting: Family Medicine

## 2013-12-02 ENCOUNTER — Ambulatory Visit (INDEPENDENT_AMBULATORY_CARE_PROVIDER_SITE_OTHER): Payer: Medicaid Other | Admitting: Family Medicine

## 2013-12-02 VITALS — BP 134/92 | HR 106 | Temp 97.9°F | Ht 68.0 in | Wt 322.1 lb

## 2013-12-02 DIAGNOSIS — M545 Low back pain, unspecified: Secondary | ICD-10-CM

## 2013-12-02 DIAGNOSIS — K148 Other diseases of tongue: Secondary | ICD-10-CM

## 2013-12-02 DIAGNOSIS — M79604 Pain in right leg: Secondary | ICD-10-CM

## 2013-12-02 MED ORDER — PHENOL 1.4 % MT LIQD
1.0000 | OROMUCOSAL | Status: DC | PRN
Start: 2013-12-02 — End: 2014-09-30

## 2013-12-02 MED ORDER — CYCLOBENZAPRINE HCL 10 MG PO TABS: 10.0000 mg | ORAL_TABLET | Freq: Three times a day (TID) | ORAL | Status: DC | PRN

## 2013-12-02 MED ORDER — GABAPENTIN 300 MG PO CAPS: 900.0000 mg | ORAL_CAPSULE | Freq: Three times a day (TID) | ORAL | Status: DC

## 2013-12-02 NOTE — Progress Notes (Signed)
Subjective:     Patient ID: Monica Huang, female   DOB: 10-10-1981, 33 y.o.   MRN: [id number]  HPI 33 year old morbidly obese female with chronic low back pain presents for evaluation of bump on tongue and back pain  1) Bump on Tongue - Approximately one week ago patient noted a few bumps on the back of her tongue. - Bumps associated with some discomfort.  No associated sore throat, dysphagia. - No exacerbating or relieving factors. No interventions tried.   2)  Low back pain, chronic  - Patient has had low back pain with radiation to the right lower leg for over a year now. - She is currently taking gabapentin 600 mg 3 times a day and diclofenac 75 mg twice a day with some improvement.  - She reports that recently her pain has increased.  Pain is located in the right lower back.  She reports some associated swelling.  She also continues to have numbness and tingling in the right lower leg.  - She reports occasional weakness in her lower extremities.  No saddle anesthesia or urinary/fecal incontinence.  Review of Systems Per HPI    Objective:   Physical Exam Exam: General: Well-appearing obese female in no acute distress. Mouth: 4 Enlarged papillae noted at the back of the tongue.  No overlying redness noted.   Back: Exam limited as patient is morbidly obese.  Lower thoracic and lumbar spine - musculature on the right tender to palpation.  Spasm noted.  Neuro: 5/5 strength in the lower extremities bilaterally.  Trace patellar and Achilles reflexes bilaterally.       Assessment/Plan:  See Problem List

## 2013-12-02 NOTE — Assessment & Plan Note (Addendum)
Increased Gabapentin to 900 mg TID.  Also gave patient Flexeril for spasm. Advised weight loss and close follow up with PCP.  PCP to consider further imaging and workup.

## 2013-12-02 NOTE — Patient Instructions (Signed)
It was nice to see you today.  The bumps on your tongue are normal papillae.  Use warm, salt water gargles and chloraseptic spray for discomfort.  In regards to your back pain: Try and lose weight.  I am increasing the dose of your Gabapentin to 900 mg (3 capsules) three times daily.  I am also giving you flexeril for pain.  Follow up with your PCP next week.

## 2013-12-02 NOTE — Assessment & Plan Note (Signed)
Patient with enlarged papillae on physical exam.  Informed patient of benign nature. Advised saltwater gargles and gave Chloraseptic Spray for pain relief.

## 2013-12-11 ENCOUNTER — Ambulatory Visit: Payer: Medicaid Other

## 2013-12-11 ENCOUNTER — Ambulatory Visit (INDEPENDENT_AMBULATORY_CARE_PROVIDER_SITE_OTHER): Payer: Medicaid Other | Admitting: Family Medicine

## 2013-12-11 ENCOUNTER — Encounter: Payer: Self-pay | Admitting: Family Medicine

## 2013-12-11 VITALS — BP 130/84 | HR 106 | Ht 68.0 in | Wt 326.0 lb

## 2013-12-11 DIAGNOSIS — L03119 Cellulitis of unspecified part of limb: Principal | ICD-10-CM

## 2013-12-11 DIAGNOSIS — L02419 Cutaneous abscess of limb, unspecified: Secondary | ICD-10-CM

## 2013-12-11 MED ORDER — SULFAMETHOXAZOLE-TRIMETHOPRIM 800-160 MG PO TABS: 2.0000 | ORAL_TABLET | Freq: Two times a day (BID) | ORAL | Status: DC

## 2013-12-11 MED ORDER — HYDROCODONE-ACETAMINOPHEN 5-325 MG PO TABS: 1.0000 | ORAL_TABLET | Freq: Four times a day (QID) | ORAL | Status: DC | PRN

## 2013-12-11 NOTE — Progress Notes (Signed)
Patient ID: Monica Huang, female   DOB: 1980-12-17, 33 y.o.   MRN: [id number] FAMILY MEDICINE OFFICE NOTE  Chief Complaint:  boils  Primary Care Physician: Felix PaciniKuneff, Renee, DO  HPI:  Monica MiresCrystal Hannig is a 33 yo with MMP including hydradinitis and a hx of recurrent boils who presents with boil on right thigh.   - started a week ago - small infected hair and then has spread.  - now indurated the size of a softball - surrounding skin redness and seems to be spreading - this am, started draining purulent drainage.  - previously was being seen by derm and placed on bactrim for these. This works well for her - never been cultured  No fevers, nausea, vomiting, diarrhea, constipation   PMHx:  Past Medical History  Diagnosis Date  . Pseudotumor cerebri syndrome 2005    shunt placed- and legally blind  . Hyperlipidemia   . Hydradenitis   . Legally blind   . Chronic leg pain   . Chronic back pain   . Sciatica     Past Surgical History  Procedure Laterality Date  . Csf shunt      2 revisions    FAMHx:  Family History  Problem Relation Age of Onset  . Diabetes Mother   . Hypertension Mother   . Diabetes Father     SOCHx:   reports that she has been smoking Cigarettes.  She has been smoking about 0.50 packs per day. She does not have any smokeless tobacco history on file. She reports that she uses illicit drugs. She reports that she does not drink alcohol.  ALLERGIES:  No Known Allergies  ROS: Pertinent ROS as seen in HPI. Otherwise negative.   HOME MEDS: Current Outpatient Prescriptions  Medication Sig Dispense Refill  . azithromycin (ZITHROMAX) 250 MG tablet Take 1 tablet (250 mg total) by mouth daily. Take first 2 tablets together, then 1 every day until finished.  6 tablet  0  . cyclobenzaprine (FLEXERIL) 10 MG tablet Take 1 tablet (10 mg total) by mouth 3 (three) times daily as needed for muscle spasms.  30 tablet  0  . diclofenac (VOLTAREN) 75 MG EC tablet Take 75  mg by mouth 2 (two) times daily.      . diclofenac (VOLTAREN) 75 MG EC tablet TAKE 1 TABLET BY MOUTH 2 TIMES DAILY  60 tablet  3  . gabapentin (NEURONTIN) 300 MG capsule Take 3 capsules (900 mg total) by mouth 3 (three) times daily.  90 capsule  3  . HYDROcodone-acetaminophen (NORCO/VICODIN) 5-325 MG per tablet Take 1 tablet by mouth every 6 (six) hours as needed.  20 tablet  0  . OVER THE COUNTER MEDICATION Take 2 tablets by mouth daily as needed (sinus congestion).      . phenol (CHLORASEPTIC) 1.4 % LIQD Use as directed 1 spray in the mouth or throat as needed for throat irritation / pain.  118 mL  0  . sulfamethoxazole-trimethoprim (BACTRIM DS,SEPTRA DS) 800-160 MG per tablet Take 2 tablets by mouth 2 (two) times daily.  40 tablet  0  . traMADol (ULTRAM) 50 MG tablet Take 1 tablet (50 mg total) by mouth every 8 (eight) hours as needed for pain.  30 tablet  1   No current facility-administered medications for this visit.    LABS/IMAGING: No results found for this or any previous visit (from the past 48 hour(s)). No results found.  VITALS: BP 130/84  Pulse 106  Ht 5\' 8"  (1.727  m)  Wt 326 lb (147.873 kg)  BMI 49.58 kg/m2  EXAM: Gen: NAD, well appearing, legally blind SKIN: right medial thigh with ~4cm area of fluctuance with easily expressible purulent drainage. Surrounding induration for approx 5cm superiorly. Erythema and warmth to the skin extending to the anterior thigh.     ASSESSMENT: Cellulitis and abscess of leg - Plan: Wound culture  PLAN: - abscess already draining purulent drainage and expressibly evacuated significant amount today.  - wound culture obtained  - given surrounding cellulitis, rx of bactrim DS BID given as concerning for MRSA.   - cont warm soaks  - pt reliable to f/u and will f/u if worsens, reforms a fluctuant area, or erythema spreads  F/u as scheduled for chronic medical problems with PCP in 1 month.   Evertt Chouinard, Redmond Baseman, MD

## 2013-12-11 NOTE — Patient Instructions (Signed)
Abscess  Care After  An abscess (also called a boil or furuncle) is an infected area that contains a collection of pus. Signs and symptoms of an abscess include pain, tenderness, redness, or hardness, or you may feel a moveable soft area under your skin. An abscess can occur anywhere in the body. The infection may spread to surrounding tissues causing cellulitis. A cut (incision) by the surgeon was made over your abscess and the pus was drained out. Gauze may have been packed into the space to provide a drain that will allow the cavity to heal from the inside outwards. The boil may be painful for 5 to 7 days. Most people with a boil do not have high fevers. Your abscess, if seen early, may not have localized, and may not have been lanced. If not, another appointment may be required for this if it does not get better on its own or with medications.  HOME CARE INSTRUCTIONS   · Only take over-the-counter or prescription medicines for pain, discomfort, or fever as directed by your caregiver.  · When you bathe, soak and then remove gauze or iodoform packs at least daily or as directed by your caregiver. You may then wash the wound gently with mild soapy water. Repack with gauze or do as your caregiver directs.  SEEK IMMEDIATE MEDICAL CARE IF:   · You develop increased pain, swelling, redness, drainage, or bleeding in the wound site.  · You develop signs of generalized infection including muscle aches, chills, fever, or a general ill feeling.  · An oral temperature above 102° F (38.9° C) develops, not controlled by medication.  See your caregiver for a recheck if you develop any of the symptoms described above. If medications (antibiotics) were prescribed, take them as directed.  Document Released: 06/01/2005 Document Revised: 02/05/2012 Document Reviewed: 01/27/2008  Exit[employer]® Patient Information ©2014 [employer].

## 2013-12-13 LAB — WOUND CULTURE: GRAM STAIN: NONE SEEN

## 2013-12-25 ENCOUNTER — Ambulatory Visit: Payer: Medicaid Other | Admitting: Family Medicine

## 2013-12-30 ENCOUNTER — Telehealth: Payer: Self-pay | Admitting: Family Medicine

## 2013-12-30 ENCOUNTER — Other Ambulatory Visit (HOSPITAL_COMMUNITY)
Admission: RE | Admit: 2013-12-30 | Discharge: 2013-12-30 | Disposition: A | Discharge status: Home / Self Care | Payer: Medicaid Other | Source: Ambulatory Visit | Attending: Family Medicine | Admitting: Family Medicine

## 2013-12-30 ENCOUNTER — Encounter: Payer: Self-pay | Admitting: Family Medicine

## 2013-12-30 ENCOUNTER — Ambulatory Visit (INDEPENDENT_AMBULATORY_CARE_PROVIDER_SITE_OTHER): Payer: Medicaid Other | Admitting: Family Medicine

## 2013-12-30 VITALS — BP 133/79 | HR 105 | Temp 99.0°F | Ht 68.0 in | Wt 327.0 lb

## 2013-12-30 DIAGNOSIS — Z113 Encounter for screening for infections with a predominantly sexual mode of transmission: Secondary | ICD-10-CM | POA: Insufficient documentation

## 2013-12-30 DIAGNOSIS — N760 Acute vaginitis: Secondary | ICD-10-CM

## 2013-12-30 DIAGNOSIS — N898 Other specified noninflammatory disorders of vagina: Secondary | ICD-10-CM | POA: Insufficient documentation

## 2013-12-30 DIAGNOSIS — N899 Noninflammatory disorder of vagina, unspecified: Secondary | ICD-10-CM

## 2013-12-30 LAB — POCT WET PREP (WET MOUNT): CLUE CELLS WET PREP WHIFF POC: POSITIVE

## 2013-12-30 MED ORDER — FLUCONAZOLE 150 MG PO TABS: 150.0000 mg | ORAL_TABLET | Freq: Once | ORAL | Status: DC

## 2013-12-30 MED ORDER — ZINC OXIDE 20 % EX OINT: 1.0000 "application " | TOPICAL_OINTMENT | Freq: Three times a day (TID) | CUTANEOUS | Status: DC

## 2013-12-30 MED ORDER — NYSTATIN 100000 UNIT/GM EX CREA: 1.0000 "application " | TOPICAL_CREAM | Freq: Three times a day (TID) | CUTANEOUS | Status: DC

## 2013-12-30 MED ORDER — METRONIDAZOLE 500 MG PO TABS: 500.0000 mg | ORAL_TABLET | Freq: Three times a day (TID) | ORAL | Status: DC

## 2013-12-30 NOTE — Patient Instructions (Signed)
Safe Sex Safe sex is about reducing the risk of giving or getting a sexually transmitted disease (STD). STDs are spread through sexual contact involving the genitals, mouth, or rectum. Some STDS can be cured and others cannot. Safe sex can also prevent unintended pregnancies.  SAFE SEX PRACTICES  Limit your sexual activity to only one partner who is only having sex with you.  Talk to your partner about their past partners, past STDs, and drug use.  Use a condom every time you have sexual intercourse. This includes vaginal, oral, and anal sexual activity. Both females and males should wear condoms during oral sex. Only use latex or polyurethane condoms and water-based lubricants. Petroleum-based lubricants or oils used to lubricate a condom will weaken the condom and increase the chance that it will break. The condom should be in place from the beginning to the end of sexual activity. Wearing a condom reduces, but does not completely eliminate, your risk of getting or giving a STD. STDs can be spread by contact with skin of surrounding areas.  Get vaccinated for hepatitis B and HPV.  Avoid alcohol and recreational drugs which can affect your judgement. You may forget to use a condom or participate in high-risk sex.  For females, avoid douching after sexual intercourse. Douching can spread an infection farther into the reproductive tract.  Check your body for signs of sores, blisters, rashes, or unusual discharge. See your caregiver if you notice any of these signs.  Avoid sexual contact if you have symptoms of an infection or are being treated for an STD. If you or your partner has herpes, avoid sexual contact when blisters are present. Use condoms at all other times.  See your caregiver for regular screenings, examinations, and tests for STDs. Before having sex with a new partner, each of you should be screened for STDs and talk about the results with your partner. BENEFITS OF SAFE SEX   There  is less of a chance of getting or giving an STD.  You can prevent unwanted or unintended pregnancies.  By discussing safer sex concerns with your partner, you may increase feelings of intimacy, comfort, trust, and honesty between the both of you. Document Released: 12/21/2004 Document Revised: 08/07/2012 Document Reviewed: 05/06/2012 [employer] Patient Information 2014 [employer].  

## 2013-12-30 NOTE — Telephone Encounter (Signed)
Called pt to inform her of the wet prep results. Called in flagyl for 7 days TID.

## 2013-12-30 NOTE — Progress Notes (Signed)
   Subjective:    Patient ID: Monica Huang, female    DOB: July 16, 1981, 33 y.o.   MRN: [id number]  HPI  Vaginal itching:Thought she had a yeast infection, so she tried Monastat which improved the itching for 1-2 days. She is unsure of redness, but think it is swollen. She has not noticed increased discharge in underwear. She has noticed a new odor.  monogamous relationship, female partner. Unsure if he is faithful. No fever or abdominal pain. No dysuria. Recent abx use for boil; pt reports she is still tacking bactrim. She does not feel the irritation happened after, but before ax use.  Trich positive last year, both partners treated.  Review of Systems Negative, with the exception of above mentioned in HPI     Objective:   Physical Exam Gen: NAD.  Abd: Soft. Morbidly obese. NTND. BS present Skin: Severely irritated medial thighs.Lanced boil and 2nd small boil noted rt medial thigh.  .  GYN:  External genitalia with erythema and yeast like discharge.  Vaginal mucosa with erythema, moist, normal rugae.  Nonfriable cervix without lesions, moderat thick white discharge. No bleeding noted on speculum exam.  Bimanual exam revealed normal, nongravid uterus.  No cervical motion tenderness. No adnexal masses bilaterally.

## 2013-12-30 NOTE — Assessment & Plan Note (Signed)
Wet prep  and GC culture obtained today.  Pt treated for yeast and BV, with diflucan/flagyl and nystatin cream for thighs.  F/u in 1 week if no improvement

## 2013-12-31 ENCOUNTER — Telehealth: Payer: Self-pay | Admitting: *Deleted

## 2013-12-31 NOTE — Telephone Encounter (Signed)
Dr Mickey FarberKuneff,please advise. Monica Huang, Monica Huang

## 2013-12-31 NOTE — Telephone Encounter (Signed)
[facility] pharmacy associate called needing to clarify Rx for the hydrocortisone-nystatin-zinc cream.  Need to know how much to dispense, the ratio and is it a compound?  Contact # [phone]. Clovis PuMartin, Yehudah Standing L, RN

## 2013-12-31 NOTE — Telephone Encounter (Signed)
Pt called and would like to check the status of her request for a medication change. jw

## 2014-01-01 ENCOUNTER — Telehealth: Payer: Self-pay | Admitting: Family Medicine

## 2014-01-01 NOTE — Telephone Encounter (Signed)
LVM for patient to call back. ?

## 2014-01-01 NOTE — Telephone Encounter (Signed)
Patient informed. 

## 2014-01-01 NOTE — Telephone Encounter (Signed)
Please inform pt G/C culture was negative and her cream for yeast (on her legs), is called in to pharmacy. Thanks

## 2014-01-01 NOTE — Telephone Encounter (Signed)
Nystatin cream re-called in to pharmacy today. Should be ready for pick up. Thanks.

## 2014-01-05 ENCOUNTER — Telehealth: Payer: Self-pay | Admitting: Family Medicine

## 2014-01-05 NOTE — Telephone Encounter (Signed)
Pt called and wanted the doctor to know that the medication she was last week for her yeast infection in not working. She said that she was still in pain and itching. She wanted to know if we could call in something else. jw

## 2014-01-05 NOTE — Telephone Encounter (Signed)
The medication is for yeast on the skin. Some of her pain could be from the abscess, if she is still having issues she needs to be re-evaluated. Thanks.

## 2014-01-19 ENCOUNTER — Encounter (HOSPITAL_COMMUNITY): Payer: Self-pay | Admitting: Emergency Medicine

## 2014-01-19 ENCOUNTER — Telehealth: Payer: Self-pay | Admitting: Family Medicine

## 2014-01-19 DIAGNOSIS — F17200 Nicotine dependence, unspecified, uncomplicated: Secondary | ICD-10-CM | POA: Insufficient documentation

## 2014-01-19 DIAGNOSIS — H548 Legal blindness, as defined in USA: Secondary | ICD-10-CM | POA: Insufficient documentation

## 2014-01-19 DIAGNOSIS — N898 Other specified noninflammatory disorders of vagina: Secondary | ICD-10-CM | POA: Insufficient documentation

## 2014-01-19 DIAGNOSIS — G8929 Other chronic pain: Secondary | ICD-10-CM | POA: Insufficient documentation

## 2014-01-19 DIAGNOSIS — L293 Anogenital pruritus, unspecified: Secondary | ICD-10-CM | POA: Insufficient documentation

## 2014-01-19 NOTE — ED Notes (Signed)
Pt states that she has had vaginal discharge, itching and swelling for about 1 week. Pt was seen by her PCP and was given an antibiotic. Pt states that she has had no relief.

## 2014-01-20 ENCOUNTER — Ambulatory Visit (INDEPENDENT_AMBULATORY_CARE_PROVIDER_SITE_OTHER): Payer: Medicaid Other | Admitting: Family Medicine

## 2014-01-20 ENCOUNTER — Emergency Department (HOSPITAL_COMMUNITY)
Admission: EM | Admit: 2014-01-20 | Discharge: 2014-01-20 | Discharge status: Against Medical Advice | Payer: Medicaid Other | Attending: Emergency Medicine | Admitting: Emergency Medicine

## 2014-01-20 ENCOUNTER — Encounter: Payer: Self-pay | Admitting: Family Medicine

## 2014-01-20 VITALS — BP 136/86 | HR 99 | Temp 98.3°F | Wt 325.0 lb

## 2014-01-20 DIAGNOSIS — N898 Other specified noninflammatory disorders of vagina: Secondary | ICD-10-CM

## 2014-01-20 DIAGNOSIS — N899 Noninflammatory disorder of vagina, unspecified: Secondary | ICD-10-CM

## 2014-01-20 MED ORDER — FLUCONAZOLE 150 MG PO TABS: 150.0000 mg | ORAL_TABLET | Freq: Once | ORAL | Status: DC

## 2014-01-20 NOTE — Progress Notes (Signed)
Subjective:     Patient ID: Monica Huang, female   DOB: 07-03-1981, 33 y.o.   MRN: [id number]  Vaginal Itching The patient's primary symptoms include genital itching and a vaginal discharge. The patient's pertinent negatives include no genital lesions, genital odor or vaginal bleeding. This is a recurrent problem. The current episode started 1 to 4 weeks ago. The problem occurs constantly. Progression since onset: initially improved after 2nd dose of fluconazole then worsened. She is not pregnant. Pertinent negatives include no abdominal pain, chills, diarrhea, discolored urine, dysuria, fever or painful intercourse. The vaginal discharge was thick and white. There has been no bleeding. Nothing aggravates the symptoms. She has tried antifungals for the symptoms. The treatment provided moderate relief. She is sexually active (has been sexually active with same partner since last treatment).     Review of Systems  Constitutional: Negative for fever and chills.  Gastrointestinal: Negative for abdominal pain and diarrhea.  Genitourinary: Positive for vaginal discharge. Negative for dysuria.       Objective:   Physical Exam  Cardiovascular: Normal rate and regular rhythm.   Pulmonary/Chest: Effort normal and breath sounds normal.  Abdominal: Soft. There is no tenderness.  Genitourinary:  Pt declined vaginal exam today    Assessment/Plan:      See Problem Focused Assessment & Plan

## 2014-01-20 NOTE — Patient Instructions (Signed)
It was great seeing you today.   1. Take fluconazole today and then again in three days.  2. Call/return to clinic if symptoms not resolved after second dose  I look forward to talking with you again at our next visit. If you have any questions or concerns before then, please call the clinic at [phone].  Take Care,   Dr Wenda LowJames Macsen Nuttall

## 2014-01-20 NOTE — Assessment & Plan Note (Signed)
Vaginal itching initially improved then reoccurred - Deferred repeat vaginal exam today as pt denies new symptoms or new sexual partner - Retreat w/ Fluconazole: two doses - If symptoms persist would perform vaginal yeast culture/ repeat wet mount

## 2014-01-20 NOTE — ED Notes (Signed)
Called x's 3 without response 

## 2014-02-11 ENCOUNTER — Encounter: Payer: Self-pay | Admitting: Family Medicine

## 2014-02-11 ENCOUNTER — Ambulatory Visit (INDEPENDENT_AMBULATORY_CARE_PROVIDER_SITE_OTHER): Payer: Medicaid Other | Admitting: Family Medicine

## 2014-02-11 VITALS — BP 136/84 | HR 102 | Temp 98.6°F | Ht 68.0 in | Wt 326.0 lb

## 2014-02-11 DIAGNOSIS — E669 Obesity, unspecified: Secondary | ICD-10-CM

## 2014-02-11 DIAGNOSIS — N898 Other specified noninflammatory disorders of vagina: Secondary | ICD-10-CM

## 2014-02-11 DIAGNOSIS — L732 Hidradenitis suppurativa: Secondary | ICD-10-CM

## 2014-02-11 DIAGNOSIS — N899 Noninflammatory disorder of vagina, unspecified: Secondary | ICD-10-CM

## 2014-02-11 DIAGNOSIS — B3731 Acute candidiasis of vulva and vagina: Secondary | ICD-10-CM

## 2014-02-11 DIAGNOSIS — B373 Candidiasis of vulva and vagina: Secondary | ICD-10-CM

## 2014-02-11 LAB — POCT WET PREP (WET MOUNT): Clue Cells Wet Prep Whiff POC: POSITIVE

## 2014-02-11 MED ORDER — FLUCONAZOLE 150 MG PO TABS: ORAL_TABLET | ORAL | Status: DC

## 2014-02-11 MED ORDER — METRONIDAZOLE 500 MG PO TABS: 500.0000 mg | ORAL_TABLET | Freq: Three times a day (TID) | ORAL | Status: DC

## 2014-02-11 MED ORDER — TRAMADOL HCL 50 MG PO TABS: 50.0000 mg | ORAL_TABLET | Freq: Three times a day (TID) | ORAL | Status: DC | PRN

## 2014-02-11 MED ORDER — DOXYCYCLINE HYCLATE 100 MG PO TABS: 100.0000 mg | ORAL_TABLET | Freq: Two times a day (BID) | ORAL | Status: DC

## 2014-02-11 NOTE — Progress Notes (Signed)
   Subjective:    Patient ID: Monica Huang, female    DOB: November 27, 1981, 33 y.o.   MRN: [id number]  Vaginal Discharge The patient's primary symptoms include a vaginal discharge. Pertinent negatives include no dysuria or urgency.    Recurrent vaginal yeast infection:  Patient returns today with recurrent vaginal yeast infection. She states she originally felt better after the first Diflucan pill. And then her infection returned. She also states she has been using the nystatin cream on her inner thighs for 2 times a day but recently has backed off from using it. She currently complains of irritation and itching mostly on the inside of her vagina and pain on her inner thighs. Patient is not a diabetic. She does admit to polydipsia, polyphagia, and polyuria.  Boils:  Patient has one open and draining will on her right inner thigh. She states she was taking antibiotics for this but has since run out. She also has a boil that is not draining under her left breast. She denies fevers or chills. She has a history of hidradenitis.  Review of Systems  Endocrine: Positive for polydipsia, polyphagia and polyuria.  Genitourinary: Positive for vaginal discharge and vaginal pain. Negative for dysuria, urgency and vaginal bleeding.  All other systems reviewed and are negative.   Objective:   Physical Exam BP 136/84  Pulse 102  Temp(Src) 98.6 F (37 C) (Oral)  Ht 5\' 8"  (1.727 m)  Wt 326 lb (147.873 kg)  BMI 49.58 kg/m2  LMP 01/11/2014 Gen: NAD.  CV: Mildly tachycardia. Chest: CTAB, no wheeze or crackles Abd: Soft. NTND. BS present. No Masses palpated.  Ext: No erythema. No edema.  Skin: No rashes, purpura or petechiae. Bilateral inner thighs with erythema. Small 1 CM boil of right inner thigh draining serosanguineous fluid.  GYN:  External genitalia with erythema. No lesions noted. Small 1 CM open draining skin abscess right inner thigh. Vaginal mucosa pink, moist, normal rugae.  Nonfriable cervix  without lesions, white thick discharge, no bleeding noted on speculum exam.  Bimanual exam revealed normal, nongravid uterus, complicated by morbid obesity  No cervical motion tenderness. No adnexal masses bilaterally.

## 2014-02-11 NOTE — Assessment & Plan Note (Signed)
Patient again with yeast and BV infection. Statin cream given for inner thighs, Diflucan given x3 for her to take every 2 days. Flagyl 3 times a day for 7 days Patient is to followup in 10 days.

## 2014-02-11 NOTE — Assessment & Plan Note (Signed)
Positive yeast again. Diflucan given x3 for her to take every 2 days. Testing for diabetes today with fasting glucose in the a.m.

## 2014-02-11 NOTE — Assessment & Plan Note (Signed)
The 2 active small boils, one of which is located on her right inner thigh approximately 1 CM in diameter and draining along with one under her left breast it is not draining, we'll treat with doxycycline 100 mg twice a day for 10 days. Unable to differentiate at this time if it is yeast on her inner thighs versus cellulitis.  Patient is to use nystatin cream as directed and take doxycycline as directed and followup with me in 10 days. Explained the concern of possible cellulitis and red flags were discussed.

## 2014-02-11 NOTE — Patient Instructions (Signed)
Diabetes, Type 2, Am I At Risk?  Diabetes is a lasting (chronic) disease. In type 2 diabetes, the pancreas does not make enough insulin, and the body does not respond normally to the insulin that is made. This type of diabetes was also previously called adult onset diabetes. About 90% of all those who have diabetes have type 2. It usually occurs after the age of 40, but can occur at any age.   People develop type 2 diabetes because they do not use insulin properly. Eventually, the pancreas cannot make enough insulin for the body's needs. Over time, the amount of glucose (sugar) in the blood increases.  RISK FACTORS  · Overweight  the more weight you have, the more resistant your cells become to insulin.  · Family history  you are more likely to get diabetes if a parent or sibling has diabetes.  · Race certain races get diabetes more.  · African Americans.  · American Indians.  · Asian Americans.  · Hispanics.  · Pacific Islander.  · Inactive exercise helps control weight and helps your cells be more sensitive to insulin.  · Gestational diabetes  some women develop diabetes while they are pregnant. This goes away when they deliver. However, they are 50-60% more likely to develop type 2 diabetes at a later time.  · Having a baby over 9 pounds  a sign that you may have had gestational diabetes.  · Age the risk of diabetes goes up as you get older, especially after age 45.  · High blood pressure (hypertension).  SYMPTOMS  Many people have no signs or symptoms. Symptoms can be so mild that you might not even notice them. Some of these signs are:  · Increased thirst.  · Increased hunger.  · Tiredness (fatigue).  · Increased urination, especially at night.  · Weight loss.  · Blurred vision.  · Sores that do not heal.  WHO SHOULD BE TESTED?  · Anyone 45 years or older, especially if overweight, should consider getting tested.  · If you are younger than 45, overweight, and have one or more of the risk factors, you should  consider getting tested.  DIAGNOSIS  · Fasting blood glucose (FBS). Usually, 2 are done.  · FBS 101-125 mg/dl is considered pre-diabetes.  · FBS 126 mg/dl or greater is considered diabetes.  · 2 hour Oral Glucose Tolerance Test (OGTT). This test is preformed by first having you not eat or drink for several hours. You are then given something sweet to drink and your blood glucose is measured fasting, at one hour and 2 hours. This test tells how well you are able to handle sugars or carbohydrates.  · Fasting: 60-100 mg/dl.  · 1 hour: less than 200 mg/dl.  · 2 hours: less than 140 mg/dl.  · A1c A1c is a blood glucose test that gives and average of your blood glucose over 3 months. It is the accepted method to use to diagnose diabetes.  · A1c 5.7-6.4% is considered pre-diabetes.  · A1c 6.5% or greater is considered diabetes.  WHAT DOES IT MEAN TO HAVE PRE-DIABETES?  Pre-diabetes means you are at risk for getting type 2 diabetes. Your blood glucose is higher than normal, but not yet high enough to diagnose diabetes. The good news is, if you have pre-diabetes you can reduce the risk of getting diabetes and even return to normal blood glucose levels. With modest weight loss and moderate physical activity, you can delay or prevent type 2   diabetes.   PREVENTION  You cannot do anything about race, age or family history, but you can lower your chances of getting diabetes. You can:   · Exercise regularly and be active.  · Reduce fat and calorie intake.  · Make wise food choices as much as you can.  · Reduce your intake of salt and alcohol.  · Maintain a reasonable weight.  · Keep blood pressure in an acceptable range. Take medication if needed.  · Not smoke.  · Maintain an acceptable cholesterol level (HDL, LDL, Triglycerides). Take medication if needed.  DOING MY PART: GETTING STARTED  Making big changes in your life is hard, especially if you are faced with more than one change. You can make it easier by taking these  steps:  · Make a plan to change behavior.  · Decide exactly what you will do and when you will do it.  · Plan what you need to get ready.  · Think about what might prevent you from reaching your goals.  · Find family and friends who will support and encourage you.  · Decide how you will reward yourself when you do what you have planned.  · Your doctor, dietitian, or counselor can help you make a plan.  HERE ARE SOME OF THE AREAS YOU MAY WISH TO CHANGE TO REDUCE YOUR RISK OF DIABETES.  If you are overweight or obese, choose sensible ways to get in shape. Even small amounts of weight loss, like 5-10 pounds, can help reduce the effects of insulin resistance and help blood glucose control.  Diet  · Avoid crash diets. Instead, eat less of the foods you usually have. Limit the amount of fat you eat.  · Increase your physical activity. Aim for at least 30 minutes of exercise most days of the week.  · Set a reasonable weight-loss goal, such as losing 1 pound a week. Aim for a long-term goal of losing 5-7% of your total body weight.  · Make wise food choices most of the time.  · What you eat has a big impact on your health. By making wise food choices, you can help control your body weight, blood pressure, and cholesterol.  · Take a hard look at the serving sizes of the foods you eat. Reduce serving sizes of meat, desserts, and foods high in fat. Increase your intake of fruits and vegetables.  · Limit your fat intake to about 25% of your total calories. For example, if your food choices add up to about 2,000 calories a day, try to eat no more than 56 grams of fat. Your caregiver or a dietitian can help you figure out how much fat to have. You can check food labels for fat content too.  · You may also want to reduce the number of calories you have each day.  · Keep a food log. Write down what you eat, how much you eat, and anything else that helps keep you on track.  · When you meet your goal, reward yourself with a nonfood  item or activity.  Exercise  · Be physically active every day.  · Keep and exercise log. Write down what exercise you did, for how long, and anything else that keeps you on track.  · Regular exercise (like brisk walking) tackles several risk factors at once. It helps you lose weight, it keeps your cholesterol and blood pressure under control, and it helps your body use insulin. People who are physically active for 30   minutes a day, 5 days a week, reduced their risk of type 2 diabetes. If you are not very active, you should start slowly at first. Talk with your caregiver first about what kinds of exercise would be safe for you. Make a plan to increase your activity level with the goal of being active for at least 30 minutes a day, most days of the week.  · Choose activities you enjoy. Here are some ways to work extra activity into your daily routine:  · Take the stairs rather than an elevator or escalator.  · Park at the far end of the lot and walk.  · Get off the bus a few stops early and walk the rest of the way.  · Walk or bicycle instead of drive whenever you can.  Medications  Some people need medication to help control their blood pressure or cholesterol levels. If you do, take your medicines as directed. Ask your caregiver whether there are any medicines you can take to prevent type 2 diabetes.  Document Released: 11/16/2003 Document Revised: 02/05/2012 Document Reviewed: 08/11/2009  Exit[employer]® Patient Information ©2014 [employer].

## 2014-02-12 ENCOUNTER — Other Ambulatory Visit (INDEPENDENT_AMBULATORY_CARE_PROVIDER_SITE_OTHER): Payer: Medicaid Other

## 2014-02-12 DIAGNOSIS — E669 Obesity, unspecified: Secondary | ICD-10-CM

## 2014-02-12 DIAGNOSIS — B3731 Acute candidiasis of vulva and vagina: Secondary | ICD-10-CM

## 2014-02-12 DIAGNOSIS — B373 Candidiasis of vulva and vagina: Secondary | ICD-10-CM

## 2014-02-12 LAB — CBC WITH DIFFERENTIAL/PLATELET
BASOS PCT: 1 % (ref 0–1)
Basophils Absolute: 0.1 10*3/uL (ref 0.0–0.1)
Eosinophils Absolute: 0.1 10*3/uL (ref 0.0–0.7)
Eosinophils Relative: 1 % (ref 0–5)
HEMATOCRIT: 45 % (ref 36.0–46.0)
Hemoglobin: 15 g/dL (ref 12.0–15.0)
Lymphocytes Relative: 40 % (ref 12–46)
Lymphs Abs: 2.9 10*3/uL (ref 0.7–4.0)
MCH: 31.3 pg (ref 26.0–34.0)
MCHC: 33.3 g/dL (ref 30.0–36.0)
MCV: 93.9 fL (ref 78.0–100.0)
MONO ABS: 0.4 10*3/uL (ref 0.1–1.0)
Monocytes Relative: 6 % (ref 3–12)
Neutro Abs: 3.8 10*3/uL (ref 1.7–7.7)
Neutrophils Relative %: 52 % (ref 43–77)
Platelets: 330 10*3/uL (ref 150–400)
RBC: 4.79 MIL/uL (ref 3.87–5.11)
RDW: 13.6 % (ref 11.5–15.5)
WBC: 7.3 10*3/uL (ref 4.0–10.5)

## 2014-02-12 LAB — BASIC METABOLIC PANEL
BUN: 8 mg/dL (ref 6–23)
CO2: 22 mEq/L (ref 19–32)
Calcium: 9.1 mg/dL (ref 8.4–10.5)
Chloride: 102 mEq/L (ref 96–112)
Creat: 0.63 mg/dL (ref 0.50–1.10)
GLUCOSE: 272 mg/dL — AB (ref 70–99)
POTASSIUM: 4.5 meq/L (ref 3.5–5.3)
Sodium: 135 mEq/L (ref 135–145)

## 2014-02-12 LAB — LIPID PANEL
CHOLESTEROL: 149 mg/dL (ref 0–200)
HDL: 41 mg/dL (ref >39)
LDL Cholesterol: 83 mg/dL (ref 0–99)
TRIGLYCERIDES: 127 mg/dL (ref <150)
Total CHOL/HDL Ratio: 3.6 Ratio
VLDL: 25 mg/dL (ref 0–40)

## 2014-02-12 NOTE — Progress Notes (Signed)
BMP,CBC WITH DIFF AND FLP DONE TODAY Bing Duffey

## 2014-02-12 NOTE — Telephone Encounter (Signed)
Patient requesting letter for work stating Gabapentin is causing her to be drowsy at work.thank you.Zigmund Linse, Virgel BouquetGiovanna S

## 2014-02-13 ENCOUNTER — Telehealth: Payer: Self-pay | Admitting: Family Medicine

## 2014-02-13 ENCOUNTER — Encounter: Payer: Self-pay | Admitting: Family Medicine

## 2014-02-13 DIAGNOSIS — E119 Type 2 diabetes mellitus without complications: Secondary | ICD-10-CM

## 2014-02-13 MED ORDER — METFORMIN HCL 500 MG PO TABS: 500.0000 mg | ORAL_TABLET | Freq: Two times a day (BID) | ORAL | Status: DC

## 2014-02-13 NOTE — Telephone Encounter (Signed)
Note written for patients employer as requested.

## 2014-02-13 NOTE — Assessment & Plan Note (Signed)
New onset diabetes.  Will start metformin and have patient return asap for appointment to discuss results and management.

## 2014-02-13 NOTE — Telephone Encounter (Signed)
Please call Monica Huang and inform her that her labs resulted with high glucose, meaning she has diabetes. Please have her make an appointment with me ASAP. We need to discuss management and medications. In the mean time I will call in metformin to start her on. This medication may cause some stomach upset and she should be taken with food. Reassure her that feeling is temporary and  Will fade away has her body gets used to it. Again I will need to see her ASAP. Thanks.

## 2014-02-17 ENCOUNTER — Other Ambulatory Visit: Payer: Self-pay | Admitting: Family Medicine

## 2014-02-19 ENCOUNTER — Encounter: Payer: Self-pay | Admitting: Family Medicine

## 2014-02-19 ENCOUNTER — Ambulatory Visit (INDEPENDENT_AMBULATORY_CARE_PROVIDER_SITE_OTHER): Payer: Medicaid Other | Admitting: Family Medicine

## 2014-02-19 VITALS — BP 129/76 | HR 102 | Temp 98.7°F | Ht 68.0 in | Wt 318.0 lb

## 2014-02-19 DIAGNOSIS — E119 Type 2 diabetes mellitus without complications: Secondary | ICD-10-CM | POA: Insufficient documentation

## 2014-02-19 DIAGNOSIS — B373 Candidiasis of vulva and vagina: Secondary | ICD-10-CM

## 2014-02-19 DIAGNOSIS — B3731 Acute candidiasis of vulva and vagina: Secondary | ICD-10-CM

## 2014-02-19 DIAGNOSIS — E1165 Type 2 diabetes mellitus with hyperglycemia: Secondary | ICD-10-CM

## 2014-02-19 DIAGNOSIS — IMO0001 Reserved for inherently not codable concepts without codable children: Secondary | ICD-10-CM

## 2014-02-19 LAB — POCT GLYCOSYLATED HEMOGLOBIN (HGB A1C): Hemoglobin A1C: 13.4

## 2014-02-19 MED ORDER — ACCU-CHEK FASTCLIX LANCETS MISC: 1.0000 | Freq: Every morning | Status: DC

## 2014-02-19 MED ORDER — GLUCOSE BLOOD VI STRP: ORAL_STRIP | Status: DC

## 2014-02-19 NOTE — Progress Notes (Signed)
   Subjective:    Patient ID: Monica Huang, female    DOB: 02-17-1981, 33 y.o.   MRN: [id number]  HPI  Diabetes: Patient is newly diagnosed diabetic. She presents office for repeat yeast infections at which time fasting blood work was completed. Her was blood glucose fasting was 272. Patient was brought in today to discuss her new diagnosis and start of medications. She started metformin 2 days ago 500 mg twice a day. She has had numbness and tingling in her extremities for some time and was on gabapentin. She is also legally blind do 2 other circumstances for diabetes.   Followup to yeast infection:  patient reports her yeast infection is cleared up. She took a Diflucan as prescribed and she no longer has redness on the anterior of her thighs or vaginal irritation. She denies vaginal discharge. She is using the nystatin cream on her legs. She denies any fevers or abdominal pain. She denies any additional pain in her legs.  Review of Systems Negative, with the exception of above mentioned in HPI     Objective:   Physical Exam BP 129/76  Pulse 102  Temp(Src) 98.7 F (37.1 C) (Oral)  Ht 5\' 8"  (1.727 m)  Wt 318 lb (144.244 kg)  BMI 48.36 kg/m2 Gen: NAD.  CV: RRR  Chest: CTAB, no wheeze or crackles Abd: Soft. Morbidly obese . NTND. BS  present. No  Masses palpated.  Ext: No erythema. No edema.   Patient declines GYN exam.

## 2014-02-19 NOTE — Assessment & Plan Note (Signed)
Patient states that with use of Diflucan and nystatin her yeast infection is completely cleared on her legs and her vaginal area. She declined a GYN exam today

## 2014-02-19 NOTE — Addendum Note (Signed)
Addended by: Felix PaciniKUNEFF, RENEE A on: 02/19/2014 01:04 PM   Modules accepted: Orders

## 2014-02-19 NOTE — Patient Instructions (Addendum)
Tuesday the 31st start taking 2 metformin pills in the morning and 2 in the evening. All want to followup with you in 3 weeks.   Diabetes and Exercise Exercising regularly is important. It is not just about losing weight. It has many health benefits, such as:  Improving your overall fitness, flexibility, and endurance.  Increasing your bone density.  Helping with weight control.  Decreasing your body fat.  Increasing your muscle strength.  Reducing stress and tension.  Improving your overall health. People with diabetes who exercise gain additional benefits because exercise:  Reduces appetite.  Improves the body's use of blood sugar (glucose).  Helps lower or control blood glucose.  Decreases blood pressure.  Helps control blood lipids (such as cholesterol and triglycerides).  Improves the body's use of the hormone insulin by:  Increasing the body's insulin sensitivity.  Reducing the body's insulin needs.  Decreases the risk for heart disease because exercising:  Lowers cholesterol and triglycerides levels.  Increases the levels of good cholesterol (such as high-density lipoproteins [HDL]) in the body.  Lowers blood glucose levels. YOUR ACTIVITY PLAN  Choose an activity that you enjoy and set realistic goals. Your health care provider or diabetes educator can help you make an activity plan that works for you. You can break activities into 2 or 3 sessions throughout the day. Doing so is as good as one long session. Exercise ideas include:  Taking the dog for a walk.  Taking the stairs instead of the elevator.  Dancing to your favorite song.  Doing your favorite exercise with a friend. RECOMMENDATIONS FOR EXERCISING WITH TYPE 1 OR TYPE 2 DIABETES   Check your blood glucose before exercising. If blood glucose levels are greater than 240 mg/dL, check for urine ketones. Do not exercise if ketones are present.  Avoid injecting insulin into areas of the body that  are going to be exercised. For example, avoid injecting insulin into:  The arms when playing tennis.  The legs when jogging.  Keep a record of:  Food intake before and after you exercise.  Expected peak times of insulin action.  Blood glucose levels before and after you exercise.  The type and amount of exercise you have done.  Review your records with your health care provider. Your health care provider will help you to develop guidelines for adjusting food intake and insulin amounts before and after exercising.  If you take insulin or oral hypoglycemic agents, watch for signs and symptoms of hypoglycemia. They include:  Dizziness.  Shaking.  Sweating.  Chills.  Confusion.  Drink plenty of water while you exercise to prevent dehydration or heat stroke. Body water is lost during exercise and must be replaced.  Talk to your health care provider before starting an exercise program to make sure it is safe for you. Remember, almost any type of activity is better than none. Document Released: 02/03/2004 Document Revised: 07/16/2013 Document Reviewed: 04/22/2013 [facility] Patient Information 2014 [place], [place].  Diabetes and Foot Care Diabetes may cause you to have problems because of poor blood supply (circulation) to your feet and legs. This may cause the skin on your feet to become thinner, break easier, and heal more slowly. Your skin may become dry, and the skin may peel and crack. You may also have nerve damage in your legs and feet causing decreased feeling in them. You may not notice minor injuries to your feet that could lead to infections or more serious problems. Taking care of your feet is  one of the most important things you can do for yourself.  HOME CARE INSTRUCTIONS  Wear shoes at all times, even in the house. Do not go barefoot. Bare feet are easily injured.  Check your feet daily for blisters, cuts, and redness. If you cannot see the bottom of your feet, use a  mirror or ask someone for help.  Wash your feet with warm water (do not use hot water) and mild soap. Then pat your feet and the areas between your toes until they are completely dry. Do not soak your feet as this can dry your skin.  Apply a moisturizing lotion or petroleum jelly (that does not contain alcohol and is unscented) to the skin on your feet and to dry, brittle toenails. Do not apply lotion between your toes.  Trim your toenails straight across. Do not dig under them or around the cuticle. File the edges of your nails with an emery board or nail file.  Do not cut corns or calluses or try to remove them with medicine.  Wear clean socks or stockings every day. Make sure they are not too tight. Do not wear knee-high stockings since they may decrease blood flow to your legs.  Wear shoes that fit properly and have enough cushioning. To break in new shoes, wear them for just a few hours a day. This prevents you from injuring your feet. Always look in your shoes before you put them on to be sure there are no objects inside.  Do not cross your legs. This may decrease the blood flow to your feet.  If you find a minor scrape, cut, or break in the skin on your feet, keep it and the skin around it clean and dry. These areas may be cleansed with mild soap and water. Do not cleanse the area with peroxide, alcohol, or iodine.  When you remove an adhesive bandage, be sure not to damage the skin around it.  If you have a wound, look at it several times a day to make sure it is healing.  Do not use heating pads or hot water bottles. They may burn your skin. If you have lost feeling in your feet or legs, you may not know it is happening until it is too late.  Make sure your health care provider performs a complete foot exam at least annually or more often if you have foot problems. Report any cuts, sores, or bruises to your health care provider immediately. SEEK MEDICAL CARE IF:   You have an  injury that is not healing.  You have cuts or breaks in the skin.  You have an ingrown nail.  You notice redness on your legs or feet.  You feel burning or tingling in your legs or feet.  You have pain or cramps in your legs and feet.  Your legs or feet are numb.  Your feet always feel cold. SEEK IMMEDIATE MEDICAL CARE IF:   There is increasing redness, swelling, or pain in or around a wound.  There is a red line that goes up your leg.  Pus is coming from a wound.  You develop a fever or as directed by your health care provider.  You notice a bad smell coming from an ulcer or wound. Document Released: 11/10/2000 Document Revised: 07/16/2013 Document Reviewed: 04/22/2013 [facility] Patient Information 2014 [place].

## 2014-02-19 NOTE — Progress Notes (Addendum)
Patient Identified Concern:  Newly diagnosed diabetic, lack of understanding of disease Stage of Change Patient Is In:  Contemplation planning on making changes within the next 6 months. Patient Reported Barriers:  Lack of understanding, unhealthy habits Patient Reported Perceived Benefits:  Taking care of her body Patient Reports Self-Efficacy:   Pt verbalizes some doubt regarding making dietary changes.  Pt reports high self efficacy for taking medications daily as prescribed. Behavior Change Supports:  Doristine ChurchFriend, Wayne will help with cooking and checking blood sugar daily. Goals:  To take metformin daily as prescribed, start checking blood sugar once a day, and to start eating protein for breakfast. Patient Education:  We discussed what diabetes is and how insulin affect the body.  We talked about behaviors that influence dm management such as smoking cessation, diet, exercise, stress, and med adherence.   Pt reports wanting to start with med adherence and dietary changes right now.  We discussed foods that raise blood sugar (carbs, sugars) and foods that help keep it level (vegetables, protein).  We talked about the importance of eating 3 meals a day.  We reviewed what a glucometer is and taught pt and partner how to use glucometer.  Pt is considering quitting smoking but is not ready for the change right now.   Pt will follow up with PCP in 3 weeks.   Pt was give medicaid- nano glucometer.

## 2014-02-19 NOTE — Assessment & Plan Note (Addendum)
Today discussion of management of her diabetes. Patient has started metformin 500 mg twice a day and will increase next week to 1000 mg twice a day. Patient is experiencing some nausea due to medication but otherwise is doing well. - Discussed uncontrolled diabetes - Discussed medication management - Patient was seen by Lamont Dowdy after her office appointment, for diabetes education.  - Patient seems motivated and willing to start watching her diet and taking medications as necessary. - Also apply patient with monitoring she's to test her sugars every morning, fasting. Lamont Dowdy will go over how to monitor her glucose. Dr. Valentina Lucks is working on getting her a glucose monitor to verbalize her results because she is legally blind. - Acute Smartview kit given today from our stock. Lancets and test strips called in to pharmacy. - A1c greater than 13 today. - Followup in 3 weeks.

## 2014-03-12 ENCOUNTER — Encounter: Payer: Self-pay | Admitting: Family Medicine

## 2014-03-12 ENCOUNTER — Ambulatory Visit (INDEPENDENT_AMBULATORY_CARE_PROVIDER_SITE_OTHER): Payer: Medicaid Other | Admitting: Family Medicine

## 2014-03-12 VITALS — BP 109/77 | HR 102 | Temp 97.9°F | Ht 68.0 in | Wt 318.0 lb

## 2014-03-12 DIAGNOSIS — IMO0001 Reserved for inherently not codable concepts without codable children: Secondary | ICD-10-CM

## 2014-03-12 DIAGNOSIS — E119 Type 2 diabetes mellitus without complications: Secondary | ICD-10-CM

## 2014-03-12 DIAGNOSIS — E1165 Type 2 diabetes mellitus with hyperglycemia: Secondary | ICD-10-CM

## 2014-03-12 MED ORDER — METFORMIN HCL 500 MG PO TABS: 1000.0000 mg | ORAL_TABLET | Freq: Two times a day (BID) | ORAL | Status: DC

## 2014-03-12 NOTE — Assessment & Plan Note (Signed)
Patient doing well on metformin. Today she monitor her morning blood glucoses. Prescribed 1000 mg pill of metformin to be taken twice a day. Counseling today on expectations of diabetes and improvements that can be had with weight loss. She seems motivated to lose weight and hopefully no longer be a diabetic.  Patient will need retinopathy scan. Foot exam and repeat A1c on her next visit in 3 months. Diabetic diet sample and label reading instructions given via AVS. Patient is aware that she will be seeing me every 3 months initially until we can lower her A1c.

## 2014-03-12 NOTE — Patient Instructions (Signed)
Diabetes Meal Planning Guide The diabetes meal planning guide is a tool to help you plan your meals and snacks. It is important for people with diabetes to manage their blood glucose (sugar) levels. Choosing the right foods and the right amounts throughout your day will help control your blood glucose. Eating right can even help you improve your blood pressure and reach or maintain a healthy weight. CARBOHYDRATE COUNTING MADE EASY When you eat carbohydrates, they turn to sugar. This raises your blood glucose level. Counting carbohydrates can help you control this level so you feel better. When you plan your meals by counting carbohydrates, you can have more flexibility in what you eat and balance your medicine with your food intake. Carbohydrate counting simply means adding up the total amount of carbohydrate grams in your meals and snacks. Try to eat about the same amount at each meal. Foods with carbohydrates are listed below. Each portion below is 1 carbohydrate serving or 15 grams of carbohydrates. Ask your dietician how many grams of carbohydrates you should eat at each meal or snack. Grains and Starches  1 slice bread.   English muffin or hotdog/hamburger bun.   cup cold cereal (unsweetened).   cup cooked pasta or rice.   cup starchy vegetables (corn, potatoes, peas, beans, winter squash).  1 tortilla (6 inches).   bagel.  1 waffle or pancake (size of a CD).   cup cooked cereal.  4 to 6 small crackers. *Whole grain is recommended. Fruit  1 cup fresh unsweetened berries, melon, papaya, pineapple.  1 small fresh fruit.   banana or mango.   cup fruit juice (4 oz unsweetened).   cup canned fruit in natural juice or water.  2 tbs dried fruit.  12 to 15 grapes or cherries. Milk and Yogurt  1 cup fat-free or 1% milk.  1 cup soy milk.  6 oz light yogurt with sugar-free sweetener.  6 oz low-fat soy yogurt.  6 oz plain yogurt. Vegetables  1 cup raw or  cup  cooked is counted as 0 carbohydrates or a "free" food.  If you eat 3 or more servings at 1 meal, count them as 1 carbohydrate serving. Other Carbohydrates   oz chips or pretzels.   cup ice cream or frozen yogurt.   cup sherbet or sorbet.  2 inch square cake, no frosting.  1 tbs honey, sugar, jam, jelly, or syrup.  2 small cookies.  3 squares of graham crackers.  3 cups popcorn.  6 crackers.  1 cup broth-based soup.  Count 1 cup casserole or other mixed foods as 2 carbohydrate servings.  Foods with less than 20 calories in a serving may be counted as 0 carbohydrates or a "free" food. You may want to purchase a book or computer software that lists the carbohydrate gram counts of different foods. In addition, the nutrition facts panel on the labels of the foods you eat are a good source of this information. The label will tell you how big the serving size is and the total number of carbohydrate grams you will be eating per serving. Divide this number by 15 to obtain the number of carbohydrate servings in a portion. Remember, 1 carbohydrate serving equals 15 grams of carbohydrate. SERVING SIZES Measuring foods and serving sizes helps you make sure you are getting the right amount of food. The list below tells how big or small some common serving sizes are.  1 oz.........4 stacked dice.  3 oz.........Deck of cards.  1 tsp........Tip   of little finger.  1 tbs........Thumb.  2 tbs........Golf ball.   cup.......Half of a fist.  1 cup........A fist. SAMPLE DIABETES MEAL PLAN Below is a sample meal plan that includes foods from the grain and starches, dairy, vegetable, fruit, and meat groups. A dietician can individualize a meal plan to fit your calorie needs and tell you the number of servings needed from each food group. However, controlling the total amount of carbohydrates in your meal or snack is more important than making sure you include all of the food groups at every  meal. You may interchange carbohydrate containing foods (dairy, starches, and fruits). The meal plan below is an example of a 2000 calorie diet using carbohydrate counting. This meal plan has 17 carbohydrate servings. Breakfast  1 cup oatmeal (2 carb servings).   cup light yogurt (1 carb serving).  1 cup blueberries (1 carb serving).   cup almonds. Snack  1 large apple (2 carb servings).  1 low-fat string cheese stick. Lunch  Chicken breast salad.  1 cup spinach.   cup chopped tomatoes.  2 oz chicken breast, sliced.  2 tbs low-fat Italian dressing.  12 whole-wheat crackers (2 carb servings).  12 to 15 grapes (1 carb serving).  1 cup low-fat milk (1 carb serving). Snack  1 cup carrots.   cup hummus (1 carb serving). Dinner  3 oz broiled salmon.  1 cup brown rice (3 carb servings). Snack  1  cups steamed broccoli (1 carb serving) drizzled with 1 tsp olive oil and lemon juice.  1 cup light pudding (2 carb servings). DIABETES MEAL PLANNING WORKSHEET Your dietician can use this worksheet to help you decide how many servings of foods and what types of foods are right for you.  BREAKFAST Food Group and Servings / Carb Servings Grain/Starches __________________________________ Dairy __________________________________________ Vegetable ______________________________________ Fruit ___________________________________________ Meat __________________________________________ Fat ____________________________________________ LUNCH Food Group and Servings / Carb Servings Grain/Starches ___________________________________ Dairy ___________________________________________ Fruit ____________________________________________ Meat ___________________________________________ Fat _____________________________________________ DINNER Food Group and Servings / Carb Servings Grain/Starches ___________________________________ Dairy  ___________________________________________ Fruit ____________________________________________ Meat ___________________________________________ Fat _____________________________________________ SNACKS Food Group and Servings / Carb Servings Grain/Starches ___________________________________ Dairy ___________________________________________ Vegetable _______________________________________ Fruit ____________________________________________ Meat ___________________________________________ Fat _____________________________________________ DAILY TOTALS Starches _________________________ Vegetable ________________________ Fruit ____________________________ Dairy ____________________________ Meat ____________________________ Fat ______________________________ Document Released: 08/10/2005 Document Revised: 02/05/2012 Document Reviewed: 06/21/2009 [employer] Patient Information 2014 [employer]. Diets for Diabetes, Food Labeling Look at food labels to help you decide how much of a product you can eat. You will want to check the amount of total carbohydrate in a serving to see how the food fits into your meal plan. In the list of ingredients, the ingredient present in the largest amount by weight must be listed first, followed by the other ingredients in descending order. STANDARD OF IDENTITY Most products have a list of ingredients. However, foods that the Food and Drug Administration (FDA) has given a standard of identity do not need a list of ingredients. A standard of identity means that a food must contain certain ingredients if it is called a particular name. Examples are mayonnaise, peanut butter, ketchup, jelly, and cheese. LABELING TERMS There are many terms found on food labels. Some of these terms have specific definitions. Some terms are regulated by the FDA, and the FDA has clearly specified how they can be used. Others are not regulated or well-defined and can be misleading and  confusing. SPECIFICALLY DEFINED TERMS Nutritive Sweetener.  A sweetener that contains calories,such as table sugar or   honey. Nonnutritive Sweetener.  A sweetener with few or no calories,such as saccharin, aspartame, sucralose, and cyclamate. LABELING TERMS REGULATED BY THE FDA Free.  The product contains only a tiny or small amount of fat, cholesterol, sodium, sugar, or calories. For example, a "fat-free" product will contain less than 0.5 g of fat per serving. Low.  A food described as "low" in fat, saturated fat, cholesterol, sodium, or calories could be eaten fairly often without exceeding dietary guidelines. For example, "low in fat" means no more than 3 g of fat per serving. Lean.  "Lean" and "extra lean" are U.S. Department of Agriculture (USDA) terms for use on meat and poultry products. "Lean" means the product contains less than 10 g of fat, 4 g of saturated fat, and 95 mg of cholesterol per serving. "Lean" is not as low in fat as a product labeled "low." Extra Lean.  "Extra lean" means the product contains less than 5 g of fat, 2 g of saturated fat, and 95 mg of cholesterol per serving. While "extra lean" has less fat than "lean," it is still higher in fat than a product labeled "low." Reduced, Less, Fewer.  A diet product that contains 25% less of a nutrient or calories than the regular version. For example, hot dogs might be labeled "25% less fat than our regular hot dogs." Light/Lite.  A diet product that contains  fewer calories or  the fat of the original. For example, "light in sodium" means a product with  the usual sodium. More.  One serving contains at least 10% more of the daily value of a vitamin, mineral, or fiber than usual. Good Source Of.  One serving contains 10% to 19% of the daily value for a particular vitamin, mineral, or fiber. Excellent Source Of.  One serving contains 20% or more of the daily value for a particular nutrient. Other terms used might  be "high in" or "rich in." Enriched or Fortified.  The product contains added vitamins, minerals, or protein. Nutrition labeling must be used on enriched or fortified foods. Imitation.  The product has been altered so that it is lower in protein, vitamins, or minerals than the usual food,such as imitation peanut butter. Total Fat.  The number listed is the total of all fat found in a serving of the product. Under total fat, food labels must list saturated fat and trans fat, which are associated with raising bad cholesterol and an increased risk of heart blood vessel disease. Saturated Fat.  Mainly fats from animal-based sources. Some examples are red meat, cheese, cream, whole milk, and coconut oil. Trans Fat.  Found in some fried snack foods, packaged foods, and fried restaurant foods. It is recommended you eat as close to 0 g of trans fat as possible, since it raises bad cholesterol and lowers good cholesterol. Polyunsaturated and Monounsaturated Fats.  More healthful fats. These fats are from plant sources. Total Carbohydrate.  The number of carbohydrate grams in a serving of the product. Under total carbohydrate are listed the other carbohydrate sources, such as dietary fiber and sugars. Dietary Fiber.  A carbohydrate from plant sources. Sugars.  Sugars listed on the label contain all naturally occurring sugars as well as added sugars. LABELING TERMS NOT REGULATED BY THE FDA Sugarless.  Table sugar (sucrose) has not been added. However, the manufacturer may use another form of sugar in place of sucrose to sweeten the product. For example, sugar alcohols are used to sweeten foods. Sugar alcohols are a form   of sugar but are not table sugar. If a product contains sugar alcohols in place of sucrose, it can still be labeled "sugarless." Low Salt, Salt-Free, Unsalted, No Salt, No Salt Added, Without Added Salt.  Food that is usually processed with salt has been made without salt.  However, the food may contain sodium-containing additives, such as preservatives, leavening agents, or flavorings. Natural.  This term has no legal meaning. Organic.  Foods that are certified as organic have been inspected and approved by the USDA to ensure they are produced without pesticides, fertilizers containing synthetic ingredients, bioengineering, or ionizing radiation. Document Released: 11/16/2003 Document Revised: 02/05/2012 Document Reviewed: 06/03/2009 [employer] Patient Information 2014 [employer].  

## 2014-03-12 NOTE — Progress Notes (Signed)
   Subjective:    Patient ID: Monica Huang, female    DOB: August 25, 1981, 33 y.o.   MRN: [id number]  HPI Diabetes follow up: Patient returns today for diabetes followup. Patient states she is taking her metformin 1000 mg twice a day as prescribed. She has met with Lamont Dowdy to help her with diabetes education and taking her blood glucose. She has been taking her blood glucose every morning and sometimes in the afternoon. She states her last week her blood sugars have ranged between 126 - 183. She has begun to start watching her diet as well. Morning her blood sugar was 150. She states that she no longer has yeast infections since her blood sugars have become more controlled. Her significant other has been helping her with reading the monitor every day, since patient is legally blind, until we are able to give her the monitor that talks. She states that she has bought a treadmill and has started walking on it. She wants to lose weight because her goal is to eventually not be a diabetic any longer and not require medications.  Review of Systems  Negative, with the exception of above mentioned in HPI     Objective:   Physical Exam BP 109/77  Pulse 102  Temp(Src) 97.9 F (36.6 C) (Oral)  Ht $R'5\' 8"'ht$  (1.727 m)  Wt 318 lb (144.244 kg)  BMI 48.36 kg/m2 Gen: Morbidly obese, African American female. Legally blind.  CV: Regular  rhythm. Mildly tachycardic today. No murmurs clicks gallops or rubs  Chest: Clear to auscultation bilaterally. No wheezing or crackles Ext: No erythema or edema. Bilateral pulses are equal. 2/4 posterior tibialis.     Next appointment Patient will need retinopathy scan. Patient will need foot exam. Patient will need repeat A1c.

## 2014-04-15 ENCOUNTER — Telehealth: Payer: Self-pay | Admitting: Family Medicine

## 2014-04-15 NOTE — Telephone Encounter (Signed)
Has been taking Metaformin. She got 60 tablets on May 3 and is already out. Is taking 4 per day. [facility] says the RX only shows 2 per day. Please advise

## 2014-04-15 NOTE — Telephone Encounter (Signed)
Please advise.Monica Huang Monica Huang  

## 2014-04-16 ENCOUNTER — Other Ambulatory Visit: Payer: Self-pay | Admitting: Family Medicine

## 2014-04-16 DIAGNOSIS — E119 Type 2 diabetes mellitus without complications: Secondary | ICD-10-CM

## 2014-04-16 MED ORDER — METFORMIN HCL 1000 MG PO TABS: 1000.0000 mg | ORAL_TABLET | Freq: Two times a day (BID) | ORAL | Status: DC

## 2014-04-17 NOTE — Telephone Encounter (Signed)
Left message with husband for a return call.Monica Huang

## 2014-04-27 ENCOUNTER — Other Ambulatory Visit: Payer: Self-pay | Admitting: Family Medicine

## 2014-05-28 ENCOUNTER — Ambulatory Visit (INDEPENDENT_AMBULATORY_CARE_PROVIDER_SITE_OTHER): Payer: Medicaid Other | Admitting: Family Medicine

## 2014-05-28 ENCOUNTER — Encounter: Payer: Self-pay | Admitting: Family Medicine

## 2014-05-28 VITALS — BP 170/76 | HR 94 | Temp 97.8°F | Ht 68.0 in | Wt 317.5 lb

## 2014-05-28 DIAGNOSIS — E1165 Type 2 diabetes mellitus with hyperglycemia: Principal | ICD-10-CM

## 2014-05-28 DIAGNOSIS — IMO0001 Reserved for inherently not codable concepts without codable children: Secondary | ICD-10-CM

## 2014-05-28 LAB — POCT GLYCOSYLATED HEMOGLOBIN (HGB A1C): HEMOGLOBIN A1C: 6.9

## 2014-05-28 NOTE — Progress Notes (Signed)
Subjective:     Patient ID: Monica Huang, female   DOB: 09/01/1981, 33 y.o.   MRN: [id number]  HPI Monica Huang is a 33 y.o. female returned [facility] for DM follow up:  Diabetes: Patient reports good compliance with her medications. She is currently taking her metformin 1000 mg BID. She has continued to watch her diet, after visiting with Arlys JohnSuzanne Lineberry diabetic educator. She has been exercising on her treadmill two times a week for ten minutes. She reports no hypoglycemic/hyperglycemic  events. She has been taking her blood sugars as directed and reports numbers of 98-162. Her significant other has been aiding her with BG readings d/t to her being legally blind. Her last A1c was 13.4. She has lost 8.5 pounds since March 2015.   She is a smoker.   Review of Systems Per HPI    Objective:   Physical Exam BP 170/76  Pulse 94  Temp(Src) 97.8 F (36.6 C) (Oral)  Ht 5\' 8"  (1.727 m)  Wt 317 lb 8 oz (144.017 kg)  BMI 48.29 kg/m2 Gen: Pleasant, AAF in NAD and non-toxic in appearance.  HEENT: AT. [place]. Bilateral eyes without injections or icterus. MMM. CV: RRR  Chest: CTAB, no wheeze or crackles Ext: No erythema. No edema. Pulses equal bilateral lower extremity Skin: No rashes, purpura or petechiae. No ulcerations or non-healing wounds.  Foot exam completed and documented in Quality metrics.  Retinopathy exam scheduled

## 2014-05-28 NOTE — Patient Instructions (Signed)
Great Job!!!! You have done so well. Keep up the good work, watching your diet and getting 150 minutes of exercise a week.  You will need an appt for an eye exam   I will want to see you in 3-4 months.

## 2014-05-30 NOTE — Assessment & Plan Note (Signed)
Pt has done excellent. Her a1c dropped from 13.4 to 6.9 today. Congratulated her on her progress and weight loss. Continue to watch diet and exercise (150 minutes a week) Continue metformin at 1000 mg BID Scheduled retinopathy exam Completed foot exam: normal.  F/u 3 months

## 2014-06-02 ENCOUNTER — Ambulatory Visit (INDEPENDENT_AMBULATORY_CARE_PROVIDER_SITE_OTHER): Payer: Medicaid Other | Admitting: Home Health Services

## 2014-06-02 DIAGNOSIS — E119 Type 2 diabetes mellitus without complications: Secondary | ICD-10-CM

## 2014-06-02 NOTE — Progress Notes (Unsigned)
DIABETES Pt came in to have a retinal scan per diabetic care.   Image was taken and submitted to [facility]. Garg for reading.    Results will be available in 1-2 weeks.  Results will be given to PCP for review and to contact patient.  Monica Huang  

## 2014-09-18 ENCOUNTER — Ambulatory Visit: Payer: Medicaid Other | Admitting: Family Medicine

## 2014-09-30 ENCOUNTER — Ambulatory Visit (INDEPENDENT_AMBULATORY_CARE_PROVIDER_SITE_OTHER): Payer: Medicaid Other | Admitting: Family Medicine

## 2014-09-30 ENCOUNTER — Encounter: Payer: Self-pay | Admitting: Family Medicine

## 2014-09-30 VITALS — BP 116/84 | HR 99 | Temp 98.1°F | Ht 68.0 in | Wt 309.0 lb

## 2014-09-30 DIAGNOSIS — E119 Type 2 diabetes mellitus without complications: Secondary | ICD-10-CM

## 2014-09-30 DIAGNOSIS — R42 Dizziness and giddiness: Secondary | ICD-10-CM | POA: Insufficient documentation

## 2014-09-30 LAB — CBC
HCT: 39.6 % (ref 36.0–46.0)
HEMOGLOBIN: 13.5 g/dL (ref 12.0–15.0)
MCH: 31.8 pg (ref 26.0–34.0)
MCHC: 34.1 g/dL (ref 30.0–36.0)
MCV: 93.2 fL (ref 78.0–100.0)
Platelets: 356 10*3/uL (ref 150–400)
RBC: 4.25 MIL/uL (ref 3.87–5.11)
RDW: 13.7 % (ref 11.5–15.5)
WBC: 10.2 10*3/uL (ref 4.0–10.5)

## 2014-09-30 LAB — POCT GLYCOSYLATED HEMOGLOBIN (HGB A1C): Hemoglobin A1C: 6

## 2014-09-30 NOTE — Assessment & Plan Note (Signed)
Patient with 2 episodes of dizziness, she states this has been a chronic issue for her in the past. Blood pressure had been normal, and she did not take her blood glucose. Have asked patient if this occurs again for her she is to take her blood pressure and blood glucose with them down and call us immediately. Encouraged her to stay well-hydrated. CBC today to look for anemia.we will call patient with results are available All up as needed

## 2014-09-30 NOTE — Patient Instructions (Signed)
Please continue taking her medications as prescribed, and watching your diet. You were doing a great job controlling your diabetes. If you get dizzy again please take your blood sugar and your blood pressure and write them down and call me. I will call you with the results of your labs once they become available

## 2014-09-30 NOTE — Assessment & Plan Note (Addendum)
Patient doing great, A1c today 6.0. Congratulated patient on diabetes control and 8 pounds of weight loss. Patient encouraged continue to watch her diet, complete physical exercise daily, and take metformin as prescribed Follow-up in 3 months, if A1c is still controlled will consider discharge on appointments to 6 months.

## 2014-09-30 NOTE — Progress Notes (Signed)
   Subjective:    Patient ID: Monica Huang, female    DOB: 23-Dec-1980, 33 y.o.   MRN: [id number]  HPI Monica Huang is a 33 y.o. female presents for routine visit  Diabetes:patient reports no hyper or hypoglycemic events. She states she is doing well watching her diet closely and exercising. She continues to take metformin as prescribed 1000 mg twice a day. She states that her neuropathy pain in her fingers and toes are completely resolved, and she has stopped the gabapentin. Her blood sugars in the morning are between 94-109. She is complaining of mild discomfort around her toenails.  Dizziness: patient has had 2 episodes of dizziness in the past month. She states that it happens mostly when she is moving from a sitting to a standing position. She had her blood pressure checked when occurred at work a few weeks ago and she stated it was "normal". She is unable to remember the actual pressures. She has not checked her blood sugar at these times of dizziness. She endorses not drinking much water. She denies any syncopal events.  Nonsmoker  Past Medical History  Diagnosis Date  . Pseudotumor cerebri syndrome 2005    shunt placed- and legally blind  . Hyperlipidemia   . Hydradenitis   . Legally blind   . Chronic leg pain   . Chronic back pain   . Sciatica     Review of Systems Per history of present illness    Objective:   Physical Exam BP 116/84 mmHg  Pulse 99  Temp(Src) 98.1 F (36.7 C) (Oral)  Ht 5\' 8"  (1.727 m)  Wt 309 lb (140.161 kg)  BMI 46.99 kg/m2 Gen: a pleasant, African-American female, no acute distress, nontoxic in appearance, morbidly obese. HEENT: AT. [place].Bilateral eyes without injections or icterus. MMM.  CV: RRR  Chest: CTAB, no wheeze or crackles Abd: Soft. obese. NTND. BS present. no Masses palpated.  Ext: No erythema. No edema. +2/4 PT/DP Skin:no rashes, purpura or petechiae. No skin breakdown Foot exam completed and documented in quality metrics        Assessment & Plan:

## 2014-10-01 ENCOUNTER — Telehealth: Payer: Self-pay | Admitting: Family Medicine

## 2014-10-01 NOTE — Telephone Encounter (Signed)
Please call pt, her blood count was normal and not the cause her of her dizziness. Thanks.

## 2014-10-02 NOTE — Telephone Encounter (Signed)
Spoke with patient and informed her of below result 

## 2014-10-11 ENCOUNTER — Emergency Department (HOSPITAL_COMMUNITY): Payer: Medicaid Other

## 2014-10-11 ENCOUNTER — Emergency Department (HOSPITAL_COMMUNITY)
Admission: EM | Admit: 2014-10-11 | Discharge: 2014-10-11 | Disposition: A | Discharge status: Home / Self Care | Payer: Medicaid Other | Attending: Emergency Medicine | Admitting: Emergency Medicine

## 2014-10-11 DIAGNOSIS — Z8669 Personal history of other diseases of the nervous system and sense organs: Secondary | ICD-10-CM | POA: Insufficient documentation

## 2014-10-11 DIAGNOSIS — G8929 Other chronic pain: Secondary | ICD-10-CM | POA: Insufficient documentation

## 2014-10-11 DIAGNOSIS — R002 Palpitations: Secondary | ICD-10-CM | POA: Diagnosis present

## 2014-10-11 DIAGNOSIS — Z8639 Personal history of other endocrine, nutritional and metabolic disease: Secondary | ICD-10-CM | POA: Insufficient documentation

## 2014-10-11 DIAGNOSIS — H548 Legal blindness, as defined in USA: Secondary | ICD-10-CM | POA: Diagnosis not present

## 2014-10-11 DIAGNOSIS — M5430 Sciatica, unspecified side: Secondary | ICD-10-CM | POA: Insufficient documentation

## 2014-10-11 DIAGNOSIS — Z79899 Other long term (current) drug therapy: Secondary | ICD-10-CM | POA: Insufficient documentation

## 2014-10-11 DIAGNOSIS — Z720 Tobacco use: Secondary | ICD-10-CM | POA: Insufficient documentation

## 2014-10-11 LAB — BASIC METABOLIC PANEL
Anion gap: 12 (ref 5–15)
BUN: 10 mg/dL (ref 6–23)
CO2: 22 mEq/L (ref 19–32)
CREATININE: 0.62 mg/dL (ref 0.50–1.10)
Calcium: 9.2 mg/dL (ref 8.4–10.5)
Chloride: 107 mEq/L (ref 96–112)
GFR calc non Af Amer: >90 mL/min (ref >90)
GLUCOSE: 89 mg/dL (ref 70–99)
Potassium: 4.4 mEq/L (ref 3.7–5.3)
Sodium: 141 mEq/L (ref 137–147)

## 2014-10-11 LAB — TSH: TSH: 1.12 u[IU]/mL (ref 0.350–4.500)

## 2014-10-11 IMAGING — CR DG CHEST 2V
3 series · 3 of 3 positions shown · non-contrast
Comparison: [DATE]

CLINICAL DATA: Chest palpitations, dizziness

EXAM:
CHEST - 2 VIEW

[w chest pa]
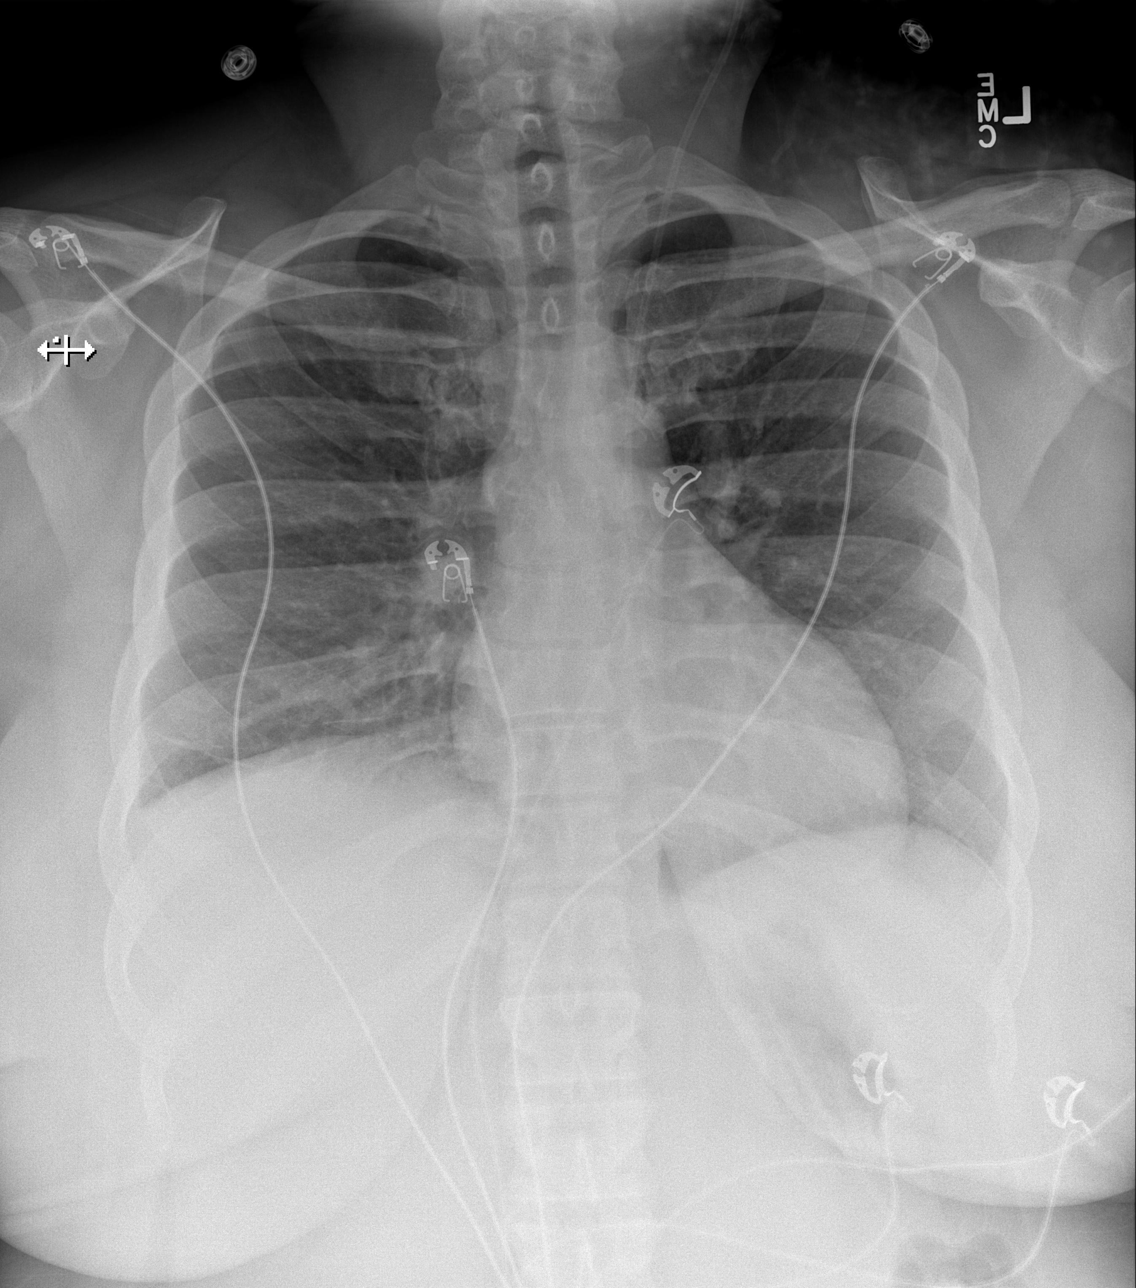

[w chest lat (1 of 2)]
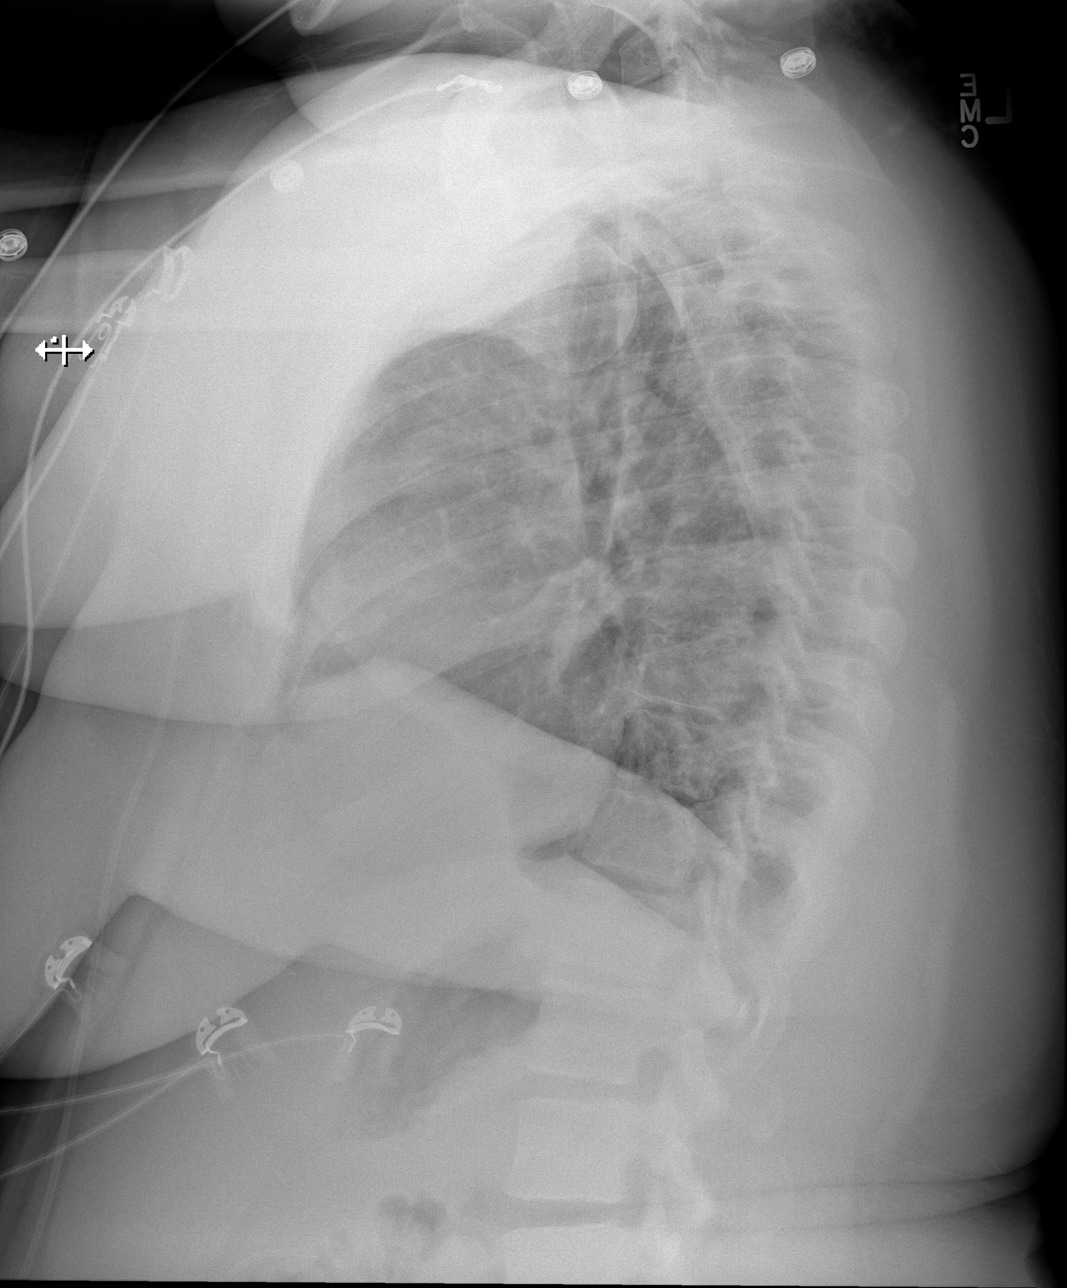

[w chest lat (2 of 2)]
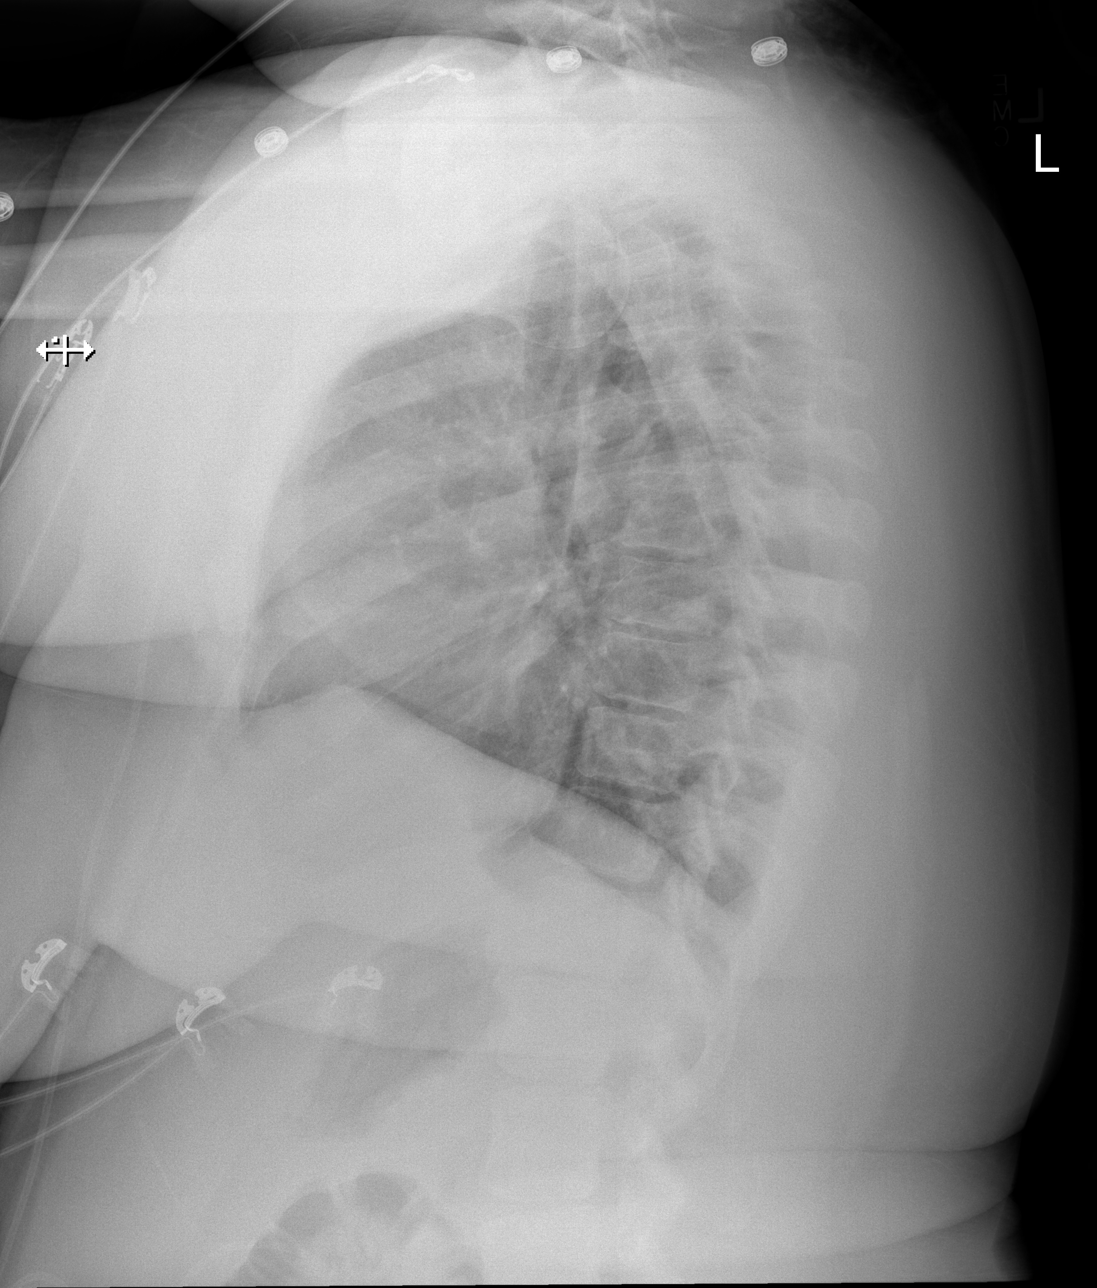

[3 of 3 positions shown; findings below may reference images not displayed]

FINDINGS: Slight decrease lung volumes with minor right base atelectasis.
Right hemidiaphragm is slightly elevated. Normal heart size and
vascularity. No focal pneumonia, collapse or consolidation. Negative
for edema or effusion. No pneumothorax. VP shunt tubing traverses
the left chest into the abdomen. Trachea is midline. No acute
osseous finding.
IMPRESSION: Low volume exam with basilar atelectasis.

## 2014-10-11 NOTE — Discharge Instructions (Signed)
Your tests are all normal including blood work and chest xray.  Please call your doctor for a followup appointment within 24-48 hours. When you talk to your doctor please let them know that you were seen in the emergency department and have them acquire all of your records so that they can discuss the findings with you and formulate a treatment plan to fully care for your new and ongoing problems.

## 2014-10-11 NOTE — ED Provider Notes (Signed)
CSN: [id number]     Arrival date & time 10/11/14  [id number] History   First MD Initiated Contact with Patient 10/11/14 [phone]     Chief Complaint  Patient presents with  . Palpitations    Pt states she feels heart rate is irregular and fast.     (Consider location/radiation/quality/duration/timing/severity/associated sxs/prior Treatment) HPI Comments: Overweight 33 year old female with a history of pseudotumor cerebri status post ventricular peritoneal shunt which was placed and partial blindness related to her pseudotumor which occurred over a decade ago. She presents with a complaint of palpitations, this started on Thursday, this is intermittent, lasts for a couple of seconds and then resolves but has recurred multiple times. She denies any chest pains, cough, shortness of breath, back pain, abdominal pain and has no headache or stiff neck. She denies any changes in her mental status she has no confusion, has started no new medications, has not had fevers chills nausea or vomiting. She denies any increase in her caffeine intake, has not had any alcohol, has not had any illegal drugs. She denies any history of palpitations. She has been taking her metformin as prescribed. Nothing seems to make this better or worse  The history is provided by the patient.    Past Medical History  Diagnosis Date  . Pseudotumor cerebri syndrome 2005    shunt placed- and legally blind  . Hyperlipidemia   . Hydradenitis   . Legally blind   . Chronic leg pain   . Chronic back pain   . Sciatica    Past Surgical History  Procedure Laterality Date  . Csf shunt      2 revisions   Family History  Problem Relation Age of Onset  . Diabetes Mother   . Hypertension Mother   . Diabetes Father    History  Substance Use Topics  . Smoking status: Current Every Day Smoker -- 0.50 packs/day    Types: Cigarettes  . Smokeless tobacco: Not on file     Comment: in process of quitting  . Alcohol Use: No   OB History     No data available     Review of Systems  All other systems reviewed and are negative.     Allergies  Review of patient's allergies indicates no known allergies.  Home Medications   Prior to Admission medications   Medication Sig Start Date End Date Taking? Authorizing Provider  ibuprofen (ADVIL,MOTRIN) 600 MG tablet Take 600 mg by mouth every 6 (six) hours as needed for mild pain or moderate pain.   Yes Historical Provider, MD  metFORMIN (GLUCOPHAGE) 1000 MG tablet Take 1 tablet (1,000 mg total) by mouth 2 (two) times daily with a meal. 04/16/14  Yes Renee A Kuneff, DO  hydrocortisone cream-nystatin cream-zinc oxide Apply 1 application topically 3 (three) times daily. Patient not taking: Reported on 10/11/2014 12/30/13   Renee A Kuneff, DO   BP 128/78 mmHg  Pulse 69  Temp(Src) 98 F (36.7 C) (Oral)  Resp 18  SpO2 98%  LMP 09/08/2014 (Approximate) Physical Exam  Constitutional: She appears well-developed and well-nourished. No distress.  HENT:  Head: Normocephalic and atraumatic.  Mouth/Throat: Oropharynx is clear and moist. No oropharyngeal exudate.  Eyes: Conjunctivae and EOM are normal. Pupils are equal, round, and reactive to light. Right eye exhibits no discharge. Left eye exhibits no discharge. No scleral icterus.  Neck: Normal range of motion. Neck supple. No JVD present. No thyromegaly present.  Cardiovascular: Normal rate, regular rhythm, normal heart sounds  and intact distal pulses.  Exam reveals no gallop and no friction rub.   No murmur heard. No ectopy on auscultation, normal pulses, no JVD  Pulmonary/Chest: Effort normal and breath sounds normal. No respiratory distress. She has no wheezes. She has no rales.  Abdominal: Soft. Bowel sounds are normal. She exhibits no distension and no mass. There is no tenderness.  Musculoskeletal: Normal range of motion. She exhibits no edema or tenderness.  Lymphadenopathy:    She has no cervical adenopathy.  Neurological: She  is alert. Coordination normal.  Skin: Skin is warm and dry. No rash noted. No erythema.  Psychiatric: She has a normal mood and affect. Her behavior is normal.  Nursing note and vitals reviewed.   ED Course  Procedures (including critical care time) Labs Review Labs Reviewed  BASIC METABOLIC PANEL  TSH    Imaging Review Dg Chest 2 View  10/11/2014   CLINICAL DATA:  Chest palpitations, dizziness  EXAM: CHEST - 2 VIEW  COMPARISON:  08/21/2008  FINDINGS: Slight decrease lung volumes with minor right base atelectasis. Right hemidiaphragm is slightly elevated. Normal heart size and vascularity. No focal pneumonia, collapse or consolidation. Negative for edema or effusion. No pneumothorax. VP shunt tubing traverses the left chest into the abdomen. Trachea is midline. No acute osseous finding.  IMPRESSION: Low volume exam with basilar atelectasis.   Electronically Signed   By: Ruel Favorsrevor  Shick M.D.   On: 10/11/2014 10:48     EKG Interpretation   Date/Time:  Sunday October 11 2014 07:56:11 EST Ventricular Rate:  79 PR Interval:  215 QRS Duration: 88 QT Interval:  360 QTC Calculation: 413 R Axis:   -13 Text Interpretation:  Sinus rhythm Prolonged PR interval Baseline wander  in lead(s) V3 Abnormal ekg No old tracing to compare Confirmed by Justus Duerr   MD, Jarrell Armond ([id number]) on 10/11/2014 8:41:44 AM      MDM   Final diagnoses:  Palpitation    Vital signs are unremarkable, patient will receive metabolic evaluation as well as EKG and cardiac monitoring and TSH, she appears clinically well. Suspect benign ectopy such as PAC or PVC given the very short lived length of her palpitations lasting less than a second or 2  No further palpitations, labs normal, patient informed, stable for discharge  Vida RollerBrian D Roylee Chaffin, MD 10/11/14 1200

## 2014-10-11 NOTE — ED Notes (Signed)
Pt states irregular heart beat this morning. Pt denies hx of cardiac or rhythm abnormalities. Pt denies chest pain, denies sob. Pt blind.

## 2014-10-16 ENCOUNTER — Ambulatory Visit: Payer: Medicaid Other | Admitting: Podiatry

## 2014-10-19 ENCOUNTER — Emergency Department (HOSPITAL_COMMUNITY)
Admission: EM | Admit: 2014-10-19 | Discharge: 2014-10-19 | Disposition: A | Discharge status: Home / Self Care | Payer: Medicaid Other | Source: Home / Self Care | Attending: Emergency Medicine | Admitting: Emergency Medicine

## 2014-10-19 ENCOUNTER — Encounter (HOSPITAL_COMMUNITY): Payer: Self-pay | Admitting: *Deleted

## 2014-10-19 DIAGNOSIS — J069 Acute upper respiratory infection, unspecified: Secondary | ICD-10-CM

## 2014-10-19 DIAGNOSIS — R062 Wheezing: Secondary | ICD-10-CM | POA: Diagnosis not present

## 2014-10-19 DIAGNOSIS — R05 Cough: Secondary | ICD-10-CM

## 2014-10-19 DIAGNOSIS — R059 Cough, unspecified: Secondary | ICD-10-CM

## 2014-10-19 DIAGNOSIS — B9789 Other viral agents as the cause of diseases classified elsewhere: Principal | ICD-10-CM

## 2014-10-19 HISTORY — DX: Type 2 diabetes mellitus without complications: E11.9

## 2014-10-19 MED ORDER — ALBUTEROL SULFATE HFA 108 (90 BASE) MCG/ACT IN AERS: 2.0000 | INHALATION_SPRAY | RESPIRATORY_TRACT | Status: DC | PRN

## 2014-10-19 MED ORDER — IPRATROPIUM-ALBUTEROL 0.5-2.5 (3) MG/3ML IN SOLN
RESPIRATORY_TRACT | Status: AC
  Filled 2014-10-19: qty 3

## 2014-10-19 MED ORDER — IPRATROPIUM-ALBUTEROL 0.5-2.5 (3) MG/3ML IN SOLN
3.0000 mL | Freq: Once | RESPIRATORY_TRACT | Status: AC
  Administered 2014-10-19: 3 mL via RESPIRATORY_TRACT

## 2014-10-19 MED ORDER — PREDNISONE 50 MG PO TABS: ORAL_TABLET | ORAL | Status: DC

## 2014-10-19 MED ORDER — BENZONATATE 100 MG PO CAPS: 100.0000 mg | ORAL_CAPSULE | Freq: Two times a day (BID) | ORAL | Status: DC | PRN

## 2014-10-19 NOTE — ED Notes (Signed)
Patient [facility] at [facility]  Xray notified and told to call us when she is ready to be picked up.

## 2014-10-19 NOTE — ED Notes (Signed)
C/o sore throat onset Thursday.  Then got cough, runny nose,  Nose hurts on the inside, headache, wheezing andchest tightness.  This AM had nausea and diarrhea x 1. Getting cold and hot sweats

## 2014-10-19 NOTE — ED Notes (Signed)
Pt.'s teenage daughter was laughing and wet on herself and Mom does not want to go to xray and come back.  She said she would f/u with her doctor. I asked her to talk with the doctor and took her back into room 1. 10/19/2014

## 2014-10-19 NOTE — ED Provider Notes (Signed)
CSN: [id number]     Arrival date & time 10/19/14  1500 History   First MD Initiated Contact with Patient 10/19/14 1558     Chief Complaint  Patient presents with  . URI   (Consider location/radiation/quality/duration/timing/severity/associated sxs/prior Treatment) HPI  She is a 33 year old woman here for evaluation of upper respiratory symptoms. Her symptoms started on Thursday with a scratchy throat. She then developed cough, headache, sinus pressure, nasal congestion, rhinorrhea, chest tightness, wheezing. She reports subjective fevers and chills. She had one episode of nausea and some loose stool today.  Past Medical History  Diagnosis Date  . Pseudotumor cerebri syndrome 2005    shunt placed- and legally blind  . Hyperlipidemia   . Hydradenitis   . Legally blind   . Chronic leg pain   . Chronic back pain   . Sciatica    Past Surgical History  Procedure Laterality Date  . Csf shunt      2 revisions   Family History  Problem Relation Age of Onset  . Diabetes Mother   . Hypertension Mother   . Diabetes Father    History  Substance Use Topics  . Smoking status: Current Every Day Smoker -- 0.50 packs/day    Types: Cigarettes  . Smokeless tobacco: Not on file     Comment: in process of quitting  . Alcohol Use: No   OB History    No data available     Review of Systems  Constitutional: Positive for fever (subjective) and chills.  HENT: Positive for congestion, rhinorrhea, sinus pressure and sore throat. Negative for ear pain.   Respiratory: Positive for cough, chest tightness and wheezing.   Gastrointestinal: Positive for nausea and diarrhea. Negative for abdominal pain.  Musculoskeletal: Positive for myalgias.  Neurological: Positive for headaches.    Allergies  Review of patient's allergies indicates no known allergies.  Home Medications   Prior to Admission medications   Medication Sig Start Date End Date Taking? Authorizing Provider  hydrocortisone  cream-nystatin cream-zinc oxide Apply 1 application topically 3 (three) times daily. Patient not taking: Reported on 10/11/2014 12/30/13   Renee A Kuneff, DO  ibuprofen (ADVIL,MOTRIN) 600 MG tablet Take 600 mg by mouth every 6 (six) hours as needed for mild pain or moderate pain.    Historical Provider, MD  metFORMIN (GLUCOPHAGE) 1000 MG tablet Take 1 tablet (1,000 mg total) by mouth 2 (two) times daily with a meal. 04/16/14   Renee A Kuneff, DO   BP 108/78 mmHg  Pulse 98  Temp(Src) 99.3 F (37.4 C) (Oral)  Resp 16  SpO2 95%  LMP 09/08/2014 (Approximate) Physical Exam  Constitutional: She is oriented to person, place, and time. She appears well-developed and well-nourished. No distress.  HENT:  Head: Normocephalic and atraumatic.  Neck: Neck supple.  Cardiovascular: Normal rate, regular rhythm and normal heart sounds.   No murmur heard. Pulmonary/Chest: Effort normal. No respiratory distress. She has wheezes. She has no rales. She exhibits no tenderness.  Neurological: She is alert and oriented to person, place, and time.    ED Course  Procedures (including critical care time) Labs Review Labs Reviewed - No data to display  Imaging Review No results found.   MDM  No diagnosis found. We'll treat with a DuoNeb and get a chest x-ray.  Breath sounds are much improved after DuoNeb. She has declined a chest x-ray.  Will treat for viral URI with wheezing with albuterol and prednisone.  Tessalon provided to use as needed for  cough. She will follow-up with her PCP later this week.    Charm RingsErin J Mery Guadalupe, MD 10/19/14 [id number]

## 2014-10-19 NOTE — Discharge Instructions (Signed)
Take prednisone 1 pill daily for 5 days. Use albuterol as needed for wheezing. Use tessalon as needed for cough.  Follow up with PCP by the end of the week.

## 2014-11-25 ENCOUNTER — Ambulatory Visit (INDEPENDENT_AMBULATORY_CARE_PROVIDER_SITE_OTHER): Payer: Medicaid Other | Admitting: Podiatry

## 2014-11-25 ENCOUNTER — Encounter: Payer: Self-pay | Admitting: Podiatry

## 2014-11-25 VITALS — BP 110/75 | HR 83 | Resp 18

## 2014-11-25 DIAGNOSIS — B351 Tinea unguium: Secondary | ICD-10-CM

## 2014-11-25 DIAGNOSIS — M79676 Pain in unspecified toe(s): Secondary | ICD-10-CM

## 2014-11-25 DIAGNOSIS — L600 Ingrowing nail: Secondary | ICD-10-CM

## 2014-11-25 NOTE — Progress Notes (Signed)
   Subjective:    Patient ID: Monica Huang, female    DOB: 1981-03-22, 33 y.o.   MRN: [id number]  HPI  33 year old female, who is legally blind diabetic, presents the office today for painful elongated toenails. She states that her diabetes is controlled with her blood sugar under 120. She denies any history of ulceration or any tingling or numbness or any claudication symptoms. She states of the big toenails become ingrown and painful. She denies any signs or symptoms of infection to the area. She presents the office with a family member. No other complaints at this time.  Review of Systems  All other systems reviewed and are negative.      Objective:   Physical Exam AAO 3, NAD DP/PT pulses palpable bilaterally, CRT less than 3 seconds Protective sensation intact with Simms Weinstein monofilament, vibratory sensation intact, Achilles tendon reflex intact. Nails hypertrophic, dystrophic, elongated, brittle, discolored 10. There is no swelling erythema or drainage from around the nail sites. There is incurvation along the medial aspects of bilateral hallux nails without any signs of infection. No open lesions or pre-ulcerative lesions. No pain with calf compression, swelling, warmth, erythema. No areas of pinpoint bony tenderness or pain with vibratory sensation. MMT 5/5, ROM WNL       Assessment & Plan:   33 year old female with symptomatically onychomycosis/ingrown toenails. -Treatment options were discussed with the patient including alternatives, risks, complications. -Nail sharply debrided 10 without complications. Discussed with the patient that if the hallux nails remaining painful or worsen would likely need a partial nail avulsion performed. Continue to monitor area. -Discussed the importance of daily foot inspection having 71 look at her feet on a regular basis. -Follow-up in 3 months or sooner should any palms arise. In the meantime, call the office with any questions,  concerns, change in symptoms.

## 2014-11-25 NOTE — Patient Instructions (Signed)
Diabetes and Foot Care Diabetes may cause you to have problems because of poor blood supply (circulation) to your feet and legs. This may cause the skin on your feet to become thinner, break easier, and heal more slowly. Your skin may become dry, and the skin may peel and crack. You may also have nerve damage in your legs and feet causing decreased feeling in them. You may not notice minor injuries to your feet that could lead to infections or more serious problems. Taking care of your feet is one of the most important things you can do for yourself.  HOME CARE INSTRUCTIONS  Wear shoes at all times, even in the house. Do not go barefoot. Bare feet are easily injured.  Check your feet daily for blisters, cuts, and redness. If you cannot see the bottom of your feet, use a mirror or ask someone for help.  Wash your feet with warm water (do not use hot water) and mild soap. Then pat your feet and the areas between your toes until they are completely dry. Do not soak your feet as this can dry your skin.  Apply a moisturizing lotion or petroleum jelly (that does not contain alcohol and is unscented) to the skin on your feet and to dry, brittle toenails. Do not apply lotion between your toes.  Trim your toenails straight across. Do not dig under them or around the cuticle. File the edges of your nails with an emery board or nail file.  Do not cut corns or calluses or try to remove them with medicine.  Wear clean socks or stockings every day. Make sure they are not too tight. Do not wear knee-high stockings since they may decrease blood flow to your legs.  Wear shoes that fit properly and have enough cushioning. To break in new shoes, wear them for just a few hours a day. This prevents you from injuring your feet. Always look in your shoes before you put them on to be sure there are no objects inside.  Do not cross your legs. This may decrease the blood flow to your feet.  If you find a minor scrape,  cut, or break in the skin on your feet, keep it and the skin around it clean and dry. These areas may be cleansed with mild soap and water. Do not cleanse the area with peroxide, alcohol, or iodine.  When you remove an adhesive bandage, be sure not to damage the skin around it.  If you have a wound, look at it several times a day to make sure it is healing.  Do not use heating pads or hot water bottles. They may burn your skin. If you have lost feeling in your feet or legs, you may not know it is happening until it is too late.  Make sure your health care provider performs a complete foot exam at least annually or more often if you have foot problems. Report any cuts, sores, or bruises to your health care provider immediately. SEEK MEDICAL CARE IF:   You have an injury that is not healing.  You have cuts or breaks in the skin.  You have an ingrown nail.  You notice redness on your legs or feet.  You feel burning or tingling in your legs or feet.  You have pain or cramps in your legs and feet.  Your legs or feet are numb.  Your feet always feel cold. SEEK IMMEDIATE MEDICAL CARE IF:   There is increasing redness,   swelling, or pain in or around a wound.  There is a red line that goes up your leg.  Pus is coming from a wound.  You develop a fever or as directed by your health care provider.  You notice a bad smell coming from an ulcer or wound. Document Released: 11/10/2000 Document Revised: 07/16/2013 Document Reviewed: 04/22/2013 ExitCare Patient Information 2015 [employer]. This information is not intended to replace advice given to you by your health care provider. Make sure you discuss any questions you have with your health care provider.  

## 2015-01-14 ENCOUNTER — Other Ambulatory Visit: Payer: Self-pay | Admitting: Family Medicine

## 2015-02-02 ENCOUNTER — Telehealth: Payer: Self-pay | Admitting: Family Medicine

## 2015-02-02 ENCOUNTER — Ambulatory Visit: Payer: Medicaid Other | Admitting: Family Medicine

## 2015-02-02 DIAGNOSIS — H3530 Unspecified macular degeneration: Secondary | ICD-10-CM

## 2015-02-02 NOTE — Telephone Encounter (Signed)
Wanted to ask dr Claiborne Billingskuneff about a [facility]

## 2015-02-03 NOTE — Telephone Encounter (Signed)
I will place [facility]. It may take some time to get this coordinated. Thanks.

## 2015-02-11 ENCOUNTER — Ambulatory Visit (INDEPENDENT_AMBULATORY_CARE_PROVIDER_SITE_OTHER): Payer: Medicaid Other | Admitting: Family Medicine

## 2015-02-11 ENCOUNTER — Encounter: Payer: Self-pay | Admitting: Family Medicine

## 2015-02-11 VITALS — BP 124/82 | HR 93 | Temp 98.4°F | Ht 68.0 in | Wt 309.0 lb

## 2015-02-11 DIAGNOSIS — Z0000 Encounter for general adult medical examination without abnormal findings: Secondary | ICD-10-CM

## 2015-02-11 DIAGNOSIS — N644 Mastodynia: Secondary | ICD-10-CM | POA: Insufficient documentation

## 2015-02-11 DIAGNOSIS — E119 Type 2 diabetes mellitus without complications: Secondary | ICD-10-CM

## 2015-02-11 LAB — POCT GLYCOSYLATED HEMOGLOBIN (HGB A1C): HEMOGLOBIN A1C: 6

## 2015-02-11 NOTE — Assessment & Plan Note (Signed)
Flu shot declined today. Patient is going to make an appointment to have her Pap smear completed within the next 4 weeks.

## 2015-02-11 NOTE — Assessment & Plan Note (Addendum)
A1c today 6.0. Patient doing very well on metformin, tolerating it well. Referral for [facility] for her macular degeneration has been placed, for second opinion. Continue 150 minutes of exercise weekly. Continue to watch diet. She was encouraged to at least check her morning fasting blood glucose daily. Follow-up in 3 - 6 months

## 2015-02-11 NOTE — Progress Notes (Signed)
   Subjective:    Patient ID: Monica Huang, female    DOB: 10-07-1981, 34 y.o.   MRN: [id number]  HPI  Diabetes: She presents to [facility] clinic for diabetes follow-up. She states she's continues to take her metformin twice a day. She continues to watch her diet, she states she doesn't feel like she's lost any weight. She has been slacking on her exercise. She was running on the treadmill, but has since stopped. She has been seen by her ophthalmologist, and has known macular degeneration, a referral for [facility] has been placed for her. She denies any numbness or tingling in her fingers or toes. She denies any hyper or hypoglycemic events. He denies any nonhealing wounds in her lower extremities. She has not been tracking her sugars regularly and has been only taking about 3 times a week at different times during the day. He doesn't recall any high sugars.  Breast irritation: Patient states that she has noticed mild pain in her left breast. This has been a location of prior small abscesses. He denies any fevers, wounds or drainage from the site. She denies any erythema. She states only mild pain and she was able to take an over-the-counter medication to help with symptoms. Certain that her abscess is returning.  Health maintenance: Patient declines flu shot today. She is due for her Pap smear.  Current every day smoker  Past Medical History  Diagnosis Date  . Pseudotumor cerebri syndrome 2005    shunt placed- and legally blind  . Hyperlipidemia   . Hydradenitis   . Legally blind   . Chronic leg pain   . Chronic back pain   . Sciatica   . Diabetes mellitus without complication   . Macular degeneration    No Known Allergies   Review of Systems Per history of present illness    Objective:   Physical Exam BP 124/82 mmHg  Pulse 93  Temp(Src) 98.4 F (36.9 C) (Oral)  Ht 5\' 8"  (1.727 m)  Wt 309 lb (140.161 kg)  BMI 46.99 kg/m2 Gen: NAD. Nontoxic, African-American female,  legally blind, obese. HEENT: AT. [place]. Bilateral eyes without injections or icterus. MMM.  CV: RRR  Chest: CTAB, no wheeze or crackles Abd: Soft. Obese. NTND. BS present. No Masses palpated.  Ext: No erythema. No edema. +2/4 PT   Assessment & Plan:

## 2015-02-11 NOTE — Patient Instructions (Signed)
Diabetes and Exercise Exercising regularly is important. It is not just about losing weight. It has many health benefits, such as:  Improving your overall fitness, flexibility, and endurance.  Increasing your bone density.  Helping with weight control.  Decreasing your body fat.  Increasing your muscle strength.  Reducing stress and tension.  Improving your overall health. People with diabetes who exercise gain additional benefits because exercise:  Reduces appetite.  Improves the body's use of blood sugar (glucose).  Helps lower or control blood glucose.  Decreases blood pressure.  Helps control blood lipids (such as cholesterol and triglycerides).  Improves the body's use of the hormone insulin by:  Increasing the body's insulin sensitivity.  Reducing the body's insulin needs.  Decreases the risk for heart disease because exercising:  Lowers cholesterol and triglycerides levels.  Increases the levels of good cholesterol (such as high-density lipoproteins [HDL]) in the body.  Lowers blood glucose levels. YOUR ACTIVITY PLAN  Choose an activity that you enjoy and set realistic goals. Your health care provider or diabetes educator can help you make an activity plan that works for you. Exercise regularly as directed by your health care provider. This includes:  Performing resistance training twice a week such as push-ups, sit-ups, lifting weights, or using resistance bands.  Performing 150 minutes of cardio exercises each week such as walking, running, or playing sports.  Staying active and spending no more than 90 minutes at one time being inactive. Even short bursts of exercise are good for you. Three 10-minute sessions spread throughout the day are just as beneficial as a single 30-minute session. Some exercise ideas include:  Taking the dog for a walk.  Taking the stairs instead of the elevator.  Dancing to your favorite song.  Doing an exercise  video.  Doing your favorite exercise with a friend. RECOMMENDATIONS FOR EXERCISING WITH TYPE 1 OR TYPE 2 DIABETES   Check your blood glucose before exercising. If blood glucose levels are greater than 240 mg/dL, check for urine ketones. Do not exercise if ketones are present.  Avoid injecting insulin into areas of the body that are going to be exercised. For example, avoid injecting insulin into:  The arms when playing tennis.  The legs when jogging.  Keep a record of:  Food intake before and after you exercise.  Expected peak times of insulin action.  Blood glucose levels before and after you exercise.  The type and amount of exercise you have done.  Review your records with your health care provider. Your health care provider will help you to develop guidelines for adjusting food intake and insulin amounts before and after exercising.  If you take insulin or oral hypoglycemic agents, watch for signs and symptoms of hypoglycemia. They include:  Dizziness.  Shaking.  Sweating.  Chills.  Confusion.  Drink plenty of water while you exercise to prevent dehydration or heat stroke. Body water is lost during exercise and must be replaced.  Talk to your health care provider before starting an exercise program to make sure it is safe for you. Remember, almost any type of activity is better than none. Document Released: 02/03/2004 Document Revised: 03/30/2014 Document Reviewed: 04/22/2013 ExitCare Patient Information 2015 [employer]. This information is not intended to replace advice given to you by your health care provider. Make sure you discuss any questions you have with your health care provider.  

## 2015-02-11 NOTE — Assessment & Plan Note (Signed)
On exam no erythema, no soft tissue swelling no masses no drainage, will continue to monitor. Patients can make appointment to have her Pap smear completed in a few weeks and we will recheck at that time.

## 2015-02-12 NOTE — Progress Notes (Signed)
I was preceptor the day of this visit.   

## 2015-02-26 ENCOUNTER — Ambulatory Visit: Payer: Medicaid Other | Admitting: Podiatry

## 2015-03-09 ENCOUNTER — Encounter: Payer: Self-pay | Admitting: Family Medicine

## 2015-03-09 ENCOUNTER — Ambulatory Visit (INDEPENDENT_AMBULATORY_CARE_PROVIDER_SITE_OTHER): Payer: Medicaid Other | Admitting: Family Medicine

## 2015-03-09 ENCOUNTER — Other Ambulatory Visit (HOSPITAL_COMMUNITY)
Admission: RE | Admit: 2015-03-09 | Discharge: 2015-03-09 | Disposition: A | Discharge status: Home / Self Care | Payer: Medicaid Other | Source: Ambulatory Visit | Attending: Family Medicine | Admitting: Family Medicine

## 2015-03-09 VITALS — BP 140/76 | HR 94 | Temp 98.1°F | Ht 68.0 in | Wt 313.4 lb

## 2015-03-09 DIAGNOSIS — Z1151 Encounter for screening for human papillomavirus (HPV): Secondary | ICD-10-CM | POA: Insufficient documentation

## 2015-03-09 DIAGNOSIS — Z01419 Encounter for gynecological examination (general) (routine) without abnormal findings: Secondary | ICD-10-CM

## 2015-03-09 DIAGNOSIS — Z124 Encounter for screening for malignant neoplasm of cervix: Secondary | ICD-10-CM | POA: Diagnosis not present

## 2015-03-09 DIAGNOSIS — E669 Obesity, unspecified: Secondary | ICD-10-CM

## 2015-03-09 NOTE — Patient Instructions (Addendum)
I will call you with your Pap smear results once they become available.  If everything is normal, you will not need another Pap smear for another 5 years.  Continue to try to exercise 150 minutes a week and continue to watch her diet.  I will place a lab order for you to have your cholesterol checked, make a lab appointment to do this and be fasting.

## 2015-03-09 NOTE — Progress Notes (Signed)
   Subjective:    Patient ID: Monica Huang, female    DOB: 07/10/1981, 34 y.o.   MRN: [id number]  HPI  Well women exam:  Patient presents to family medicine today for a well woman exam. Her last Pap smear was in 2013 and was normal , outside of trichomonas. She is currently not sexually active, no new partners, has no concerns for sexually transmitted diseases.  She denies any vaginal discharge, or vaginal irritation. Her menstrual cycles approximately every 30 days, last 3 days and is moderate bleeding. She has no family history of colon or breast cancer. She has a history of diabetes, pseudotumor cerebri syndrome with macular degeneration and chronic hydradenitis.   CBC 10/10/2014: WNL CMP 10/11/2014: WNL  TSH 10/11/2014: 1.120  current every day smoker  Past Medical History  Diagnosis Date  . Pseudotumor cerebri syndrome 2005    shunt placed- and legally blind  . Hyperlipidemia   . Hydradenitis   . Legally blind   . Chronic leg pain   . Chronic back pain   . Sciatica   . Diabetes mellitus without complication   . Macular degeneration    No Known Allergies  Review of Systems Per HPI    Objective:   Physical Exam BP 140/76 mmHg  Pulse 94  Temp(Src) 98.1 F (36.7 C) (Oral)  Ht 5\' 8"  (1.727 m)  Wt 313 lb 6.4 oz (142.157 kg)  BMI 47.66 kg/m2 Gen: NAD. Well developed, well nourished, morbidly obese female. Legally blind.  HEENT: AT. [place]. Bilateral eyes without injections or icterus. MMM.  CV: RRR  Chest: CTAB, no wheeze or crackles Abd: Soft. Morbidly obese. NTND. BS present. No Masses palpated.  GYN:  Bilateral groin hydradentitis appreciated, with mild bleeding left groin area. External genitalia within normal limits.  Vaginal mucosa pink, moist, normal rugae.  Nonfriable cervix without lesions, no discharge or bleeding noted on speculum exam. No cervical motion tenderness. No adnexal masses bilaterally.      Assessment & Plan:

## 2015-03-10 ENCOUNTER — Telehealth: Payer: Self-pay | Admitting: Family Medicine

## 2015-03-10 ENCOUNTER — Other Ambulatory Visit: Payer: Self-pay | Admitting: Family Medicine

## 2015-03-10 DIAGNOSIS — Z01419 Encounter for gynecological examination (general) (routine) without abnormal findings: Secondary | ICD-10-CM | POA: Insufficient documentation

## 2015-03-10 NOTE — Telephone Encounter (Signed)
Pt stated she was at work.  She has an appt tomorrow at 8:30 and will bring the bottle with her. Tried called [facility] pharmacy; no answer. Clovis PuMartin, Tamika L, RN

## 2015-03-10 NOTE — Assessment & Plan Note (Signed)
PAP sent to day with HPV, if normal can extend pap to 5 years.  No indication for early mammogram or colon screening.  Pt will be called once results are available.

## 2015-03-10 NOTE — Progress Notes (Signed)
I was the preceptor on the day of this visit.   Destine Zirkle MD  

## 2015-03-10 NOTE — Telephone Encounter (Signed)
Please call Monica Huang and ask her to have someone read off the label of her test strips, especially if there is a series number to her glucose monitor etc. I am aware it is Prodigy, but can not seem to get this brand to come up at all. I will need as much information as possible to attempt to order these. [facility], is this something they can get for her? Can you call and find out if that is possible (she is legally blind and needs this talking meter). If not we will need to ask her for a back up pharmacy to call these into. Thanks.

## 2015-03-11 ENCOUNTER — Other Ambulatory Visit: Payer: Self-pay | Admitting: Family Medicine

## 2015-03-11 ENCOUNTER — Telehealth: Payer: Self-pay | Admitting: *Deleted

## 2015-03-11 ENCOUNTER — Telehealth: Payer: Self-pay | Admitting: Family Medicine

## 2015-03-11 ENCOUNTER — Other Ambulatory Visit: Payer: Medicaid Other

## 2015-03-11 DIAGNOSIS — E119 Type 2 diabetes mellitus without complications: Secondary | ICD-10-CM

## 2015-03-11 LAB — LIPID PANEL
CHOLESTEROL: 147 mg/dL (ref 0–200)
HDL: 45 mg/dL — AB (ref >=46)
LDL Cholesterol: 81 mg/dL (ref 0–99)
TRIGLYCERIDES: 106 mg/dL (ref <150)
Total CHOL/HDL Ratio: 3.3 Ratio
VLDL: 21 mg/dL (ref 0–40)

## 2015-03-11 LAB — CYTOLOGY - PAP

## 2015-03-11 MED ORDER — LANCETS 30G MISC: Status: DC

## 2015-03-11 MED ORDER — GLUCOSE BLOOD VI STRP
ORAL_STRIP | Status: DC
Start: 2015-03-11 — End: 2015-03-11

## 2015-03-11 MED ORDER — GLUCOSE BLOOD VI STRP: ORAL_STRIP | Status: DC

## 2015-03-11 MED ORDER — GLUCOSE BLOOD VI STRP
ORAL_STRIP | Status: DC
Start: 2015-03-11 — End: 2015-12-21

## 2015-03-11 NOTE — Telephone Encounter (Signed)
Please call Monica Huang, her cholesterol panel looked good. Make sure she makes a appointment in June with me for f/u. Thanks.

## 2015-03-11 NOTE — Telephone Encounter (Signed)
Called in Herndon[employer]digy test strips and lancets as requested

## 2015-03-11 NOTE — Telephone Encounter (Signed)
Please call patient, her Pap smear was normal and the HPV was negative. That means her next Pap smear will not be due for another 5 years. Thank you

## 2015-03-11 NOTE — Telephone Encounter (Signed)
Informed pt of below. Zimmerman Rumple, [name]  

## 2015-03-11 NOTE — Telephone Encounter (Signed)
Pt called back and stated she would just keep using lancets and test strips that she has from other meter until she could afford to get new strips.  Clovis PuMartin, Madhav Mohon L, RN

## 2015-03-11 NOTE — Telephone Encounter (Signed)
Called [facility] regarding Prodigy Auto Code test strips and lancets.  It will cost pt $30 for 50 test strips and less than $10 for lancets.  They will have to order them because they don't carry them in the store.  Pt was informed of the cost and that pharmacy will have to order.  Pt advised that she could contact one of the mail order diabetic testing supply companies to see if it would be cheaper.  Clovis PuMartin, Cid Agena L, RN

## 2015-03-11 NOTE — Progress Notes (Signed)
[facility] phlebotomist drew:  LIPID

## 2015-03-12 NOTE — Telephone Encounter (Signed)
Spoke with patient and informed her of below. Patient will call back in May to schedule an appointment

## 2015-03-19 ENCOUNTER — Ambulatory Visit: Payer: Medicaid Other | Admitting: Podiatry

## 2015-04-27 ENCOUNTER — Telehealth: Payer: Self-pay | Admitting: Family Medicine

## 2015-04-27 NOTE — Telephone Encounter (Signed)
Pt called because Monica Huang now would like to take the BP medication that Monica Huang discussed with Dr. Claiborne BillingsKuneff. Monica Huang is willing to take this now and would like [facility] [address]. jw

## 2015-04-28 ENCOUNTER — Telehealth: Payer: Self-pay | Admitting: Family Medicine

## 2015-04-28 NOTE — Telephone Encounter (Signed)
Pt calling about starting blood pressure medication. We talked about this briefly at her last appointment but only IF her BP was elevated again, on her June appointment. She was asked to make a f/u in June with me for this and her DM. She is due for a1c after June 17th. Please encourage her to schedule an appointment with me if possible. If her BP is still elevated then, we will start a medication (she may not need any if it is not elevated). Thanks.

## 2015-04-28 NOTE — Telephone Encounter (Addendum)
Pt has an appt 05/10/15 with PCP.  BP 136/76 yesterday and today 122/76.  Pt still have complaints of dizziness.  Pt told to keep a record of BP for follow with PCP.   Clovis PuMartin, Tamika L, RN

## 2015-05-10 ENCOUNTER — Ambulatory Visit (INDEPENDENT_AMBULATORY_CARE_PROVIDER_SITE_OTHER): Payer: Medicaid Other | Admitting: Family Medicine

## 2015-05-10 ENCOUNTER — Encounter: Payer: Self-pay | Admitting: Family Medicine

## 2015-05-10 ENCOUNTER — Other Ambulatory Visit: Payer: Self-pay | Admitting: Family Medicine

## 2015-05-10 VITALS — BP 143/83 | HR 89 | Temp 98.0°F | Ht 68.75 in | Wt 315.6 lb

## 2015-05-10 DIAGNOSIS — I10 Essential (primary) hypertension: Secondary | ICD-10-CM

## 2015-05-10 DIAGNOSIS — R42 Dizziness and giddiness: Secondary | ICD-10-CM

## 2015-05-10 DIAGNOSIS — E119 Type 2 diabetes mellitus without complications: Secondary | ICD-10-CM | POA: Diagnosis not present

## 2015-05-10 LAB — POCT GLYCOSYLATED HEMOGLOBIN (HGB A1C): Hemoglobin A1C: 6.3

## 2015-05-10 MED ORDER — HYDROCHLOROTHIAZIDE 12.5 MG PO TABS
12.5000 mg | ORAL_TABLET | Freq: Every day | ORAL | Status: DC
Start: 2015-05-10 — End: 2015-07-12

## 2015-05-10 NOTE — Assessment & Plan Note (Signed)
Monica Huang is a 34 y.o. African-American female presents today with with dizziness. - Review of EKG from November is without QT prolongation, consider discussed possible etiology of dizziness. - Uncertain etiology of dizziness, orthostatics today are negative - Repeat A1c today is 6.3, continue metformin. No reports of hypoglycemia by fasting glucose. - Blood pressure is mildly elevated again today, and home blood pressures have remained mildly elevated. Will start HCTZ 12.5 mg daily and have a follow-up in one week. - CBC, CMP, TSH collected today for workup for dizziness - CT head without contrast ordered today, patient has history of mechanical CSF shunt, has not been interrogated in 6-7 years could be possible reason for mild dizziness. She has follow-up with [facility] on June 30.

## 2015-05-10 NOTE — Progress Notes (Signed)
   Subjective:    Patient ID: Monica Huang, female    DOB: 1981-10-11, 34 y.o.   MRN: [id number]  HPI   Dizziness: Patient still complains of dizziness, she states majority symptoms are when she goes from a sitting to a standing position. She states it is starting to happen when she is sitting now as well. Patient does have a history of borderline elevated blood pressures. She has been taking her blood pressures at home and they range anywhere from 118-140 systolic and 60-85 diastolic. Her blood sugars are within range, last A1c was 6.0. She is on metformin for diabetes control. Patient does have a mechanical ventricular shunt. She does admit to mild headache on occasions. She denies any blood loss, heavy periods or bloody stools. She states that she drinks a "ton" of water daily. She reports the last time she's followed up with her neurologist at [facility] for her mechanical CSF shunt was about 6-7 years ago.  Past Medical History  Diagnosis Date  . Pseudotumor cerebri syndrome 2005    shunt placed- and legally blind  . Hyperlipidemia   . Hydradenitis   . Legally blind   . Chronic leg pain   . Chronic back pain   . Sciatica   . Diabetes mellitus without complication   . Macular degeneration    No Known Allergies Past Surgical History  Procedure Laterality Date  . Csf shunt      2 revisions    Review of Systems Per HPI    Objective:   Physical Exam BP 143/83 mmHg  Pulse 91  Temp(Src) 98 F (36.7 C) (Oral)  Ht 5' 8.75" (1.746 m)  Wt 315 lb 9.6 oz (143.155 kg)  BMI 46.96 kg/m2  LMP 04/21/2015 Gen: NAD. Nontoxic in appearance, well-developed, well-nourished, morbidly obese African-American female. Legally blind.  HEENT: AT. [place]. Bilateral eyes without injections or icterus. MMM. CV: RRR  Chest: CTAB, no wheeze or crackles Ext: No erythema. No edema.  Orthostatics negative: Laying down 138/88, pulse 89, sitting 140/86 pulse 87, standing 144/87 heart rate 90     Assessment &  Plan:  Monica Huang is a 34 y.o. African-American female presents today with with dizziness. - Review of EKG from November is without QT prolongation, consider discussed possible etiology of dizziness. - Uncertain etiology of dizziness, orthostatics today are negative - Repeat A1c today is 6.3, continue metformin. No reports of hypoglycemia by fasting glucose. - Blood pressure is mildly elevated again today, and home blood pressures have remained mildly elevated. Will start HCTZ 12.5 mg daily and have a follow-up in one week. - CBC, CMP, TSH collected today for workup for dizziness - CT head without contrast ordered today, patient has history of mechanical CSF shunt, has not been interrogated in 6-7 years could be possible reason for mild dizziness. She has follow-up with [facility] on June 30.

## 2015-05-10 NOTE — Assessment & Plan Note (Signed)
Monica Huang is a 33 y.o. African-American female presents today with with dizziness. - Review of EKG from November is without QT prolongation, consider discussed possible etiology of dizziness. - Uncertain etiology of dizziness, orthostatics today are negative - Repeat A1c today is 6.3, continue metformin. No reports of hypoglycemia by fasting glucose. - Blood pressure is mildly elevated again today, and home blood pressures have remained mildly elevated. Will start HCTZ 12.5 mg daily and have a follow-up in one week. - CBC, CMP, TSH collected today for workup for dizziness - CT head without contrast ordered today, patient has history of mechanical CSF shunt, has not been interrogated in 6-7 years could be possible reason for mild dizziness. She has follow-up with [facility] on June 30. 

## 2015-05-10 NOTE — Patient Instructions (Signed)
I have started you on HCTZ 12.5 mg today start taking this tomorrow morning. Continue to stay well-hydrated, monitoring your blood pressure. The staff will call you tomorrow to set up your CT of your head, I will want to see you in one week for follow-up.

## 2015-05-11 ENCOUNTER — Telehealth: Payer: Self-pay | Admitting: Family Medicine

## 2015-05-11 ENCOUNTER — Encounter: Payer: Self-pay | Admitting: *Deleted

## 2015-05-11 LAB — COMPREHENSIVE METABOLIC PANEL
ALT: 20 U/L (ref 0–35)
AST: 12 U/L (ref 0–37)
Albumin: 4.1 g/dL (ref 3.5–5.2)
Alkaline Phosphatase: 54 U/L (ref 39–117)
BUN: 9 mg/dL (ref 6–23)
CHLORIDE: 99 meq/L (ref 96–112)
CO2: 23 meq/L (ref 19–32)
Calcium: 9.3 mg/dL (ref 8.4–10.5)
Creat: 0.69 mg/dL (ref 0.50–1.10)
Glucose, Bld: 80 mg/dL (ref 70–99)
Potassium: 4 mEq/L (ref 3.5–5.3)
SODIUM: 137 meq/L (ref 135–145)
TOTAL PROTEIN: 7.5 g/dL (ref 6.0–8.3)
Total Bilirubin: 0.3 mg/dL (ref 0.2–1.2)

## 2015-05-11 LAB — CBC WITH DIFFERENTIAL/PLATELET
Basophils Absolute: 0.1 10*3/uL (ref 0.0–0.1)
Basophils Relative: 1 % (ref 0–1)
Eosinophils Absolute: 0.2 10*3/uL (ref 0.0–0.7)
Eosinophils Relative: 2 % (ref 0–5)
HEMATOCRIT: 41.7 % (ref 36.0–46.0)
HEMOGLOBIN: 13.7 g/dL (ref 12.0–15.0)
LYMPHS ABS: 4.5 10*3/uL — AB (ref 0.7–4.0)
LYMPHS PCT: 48 % — AB (ref 12–46)
MCH: 31.4 pg (ref 26.0–34.0)
MCHC: 32.9 g/dL (ref 30.0–36.0)
MCV: 95.6 fL (ref 78.0–100.0)
MONO ABS: 0.6 10*3/uL (ref 0.1–1.0)
MPV: 9.7 fL (ref 8.6–12.4)
Monocytes Relative: 6 % (ref 3–12)
NEUTROS ABS: 4 10*3/uL (ref 1.7–7.7)
Neutrophils Relative %: 43 % (ref 43–77)
Platelets: 349 10*3/uL (ref 150–400)
RBC: 4.36 MIL/uL (ref 3.87–5.11)
RDW: 13.7 % (ref 11.5–15.5)
WBC: 9.4 10*3/uL (ref 4.0–10.5)

## 2015-05-11 LAB — TSH: TSH: 2.314 u[IU]/mL (ref 0.350–4.500)

## 2015-05-11 NOTE — Progress Notes (Signed)
I was available as preceptor to resident for this patient's office visit.  

## 2015-05-11 NOTE — Telephone Encounter (Signed)
Please call pt, her lab work from yesterday was normal. Thanks.

## 2015-05-11 NOTE — Telephone Encounter (Signed)
Letter mailed... Jala Dundon Dawn  

## 2015-05-12 ENCOUNTER — Ambulatory Visit (HOSPITAL_COMMUNITY): Payer: Medicaid Other

## 2015-05-14 ENCOUNTER — Ambulatory Visit (HOSPITAL_COMMUNITY)
Admission: RE | Admit: 2015-05-14 | Discharge: 2015-05-14 | Disposition: A | Discharge status: Home / Self Care | Payer: Medicaid Other | Source: Ambulatory Visit | Attending: Family Medicine | Admitting: Family Medicine

## 2015-05-14 DIAGNOSIS — R51 Headache: Secondary | ICD-10-CM | POA: Diagnosis not present

## 2015-05-14 DIAGNOSIS — R42 Dizziness and giddiness: Secondary | ICD-10-CM | POA: Insufficient documentation

## 2015-05-14 DIAGNOSIS — Z982 Presence of cerebrospinal fluid drainage device: Secondary | ICD-10-CM | POA: Insufficient documentation

## 2015-05-14 IMAGING — CT CT HEAD W/O CM
1 series · 16 of 30 positions shown, 20 images · non-contrast
Comparison: Head CT scan [DATE].

CLINICAL DATA: Dizziness which is worst when rising from a seated
position. Intermittent headaches. Ventriculostomy shunt catheter.

EXAM:
CT HEAD WITHOUT CONTRAST
TECHNIQUE: Contiguous axial images were obtained from the base of the skull
through the vertex without intravenous contrast.

[Series 2: head 5.0 h30s · axial · 0.46mm/px · z∈[-150,-10]mm · 16 of 32 slices shown, 20 images]
[im 2/32  brain]
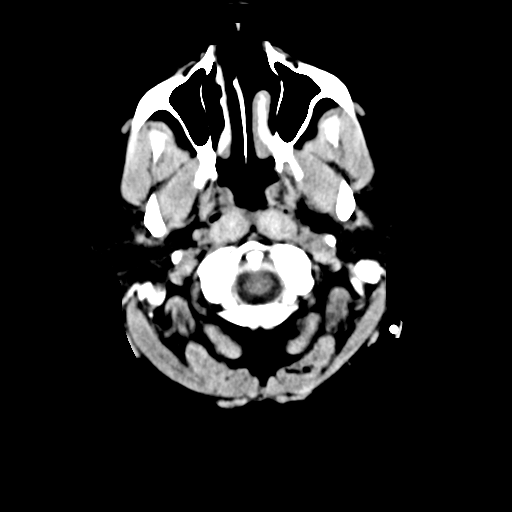
[im 2/32  bone]
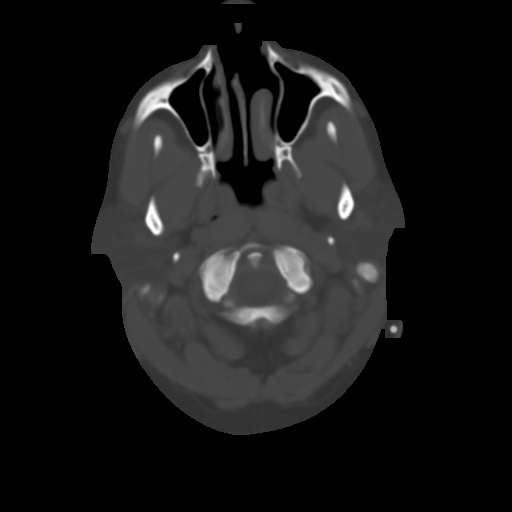
[im 4/32  brain]
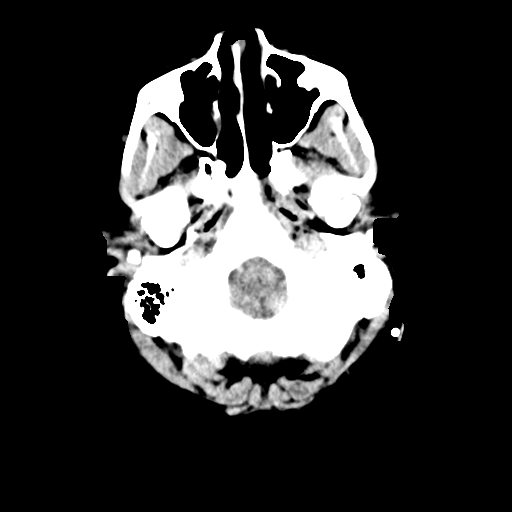
[im 6/32  brain]
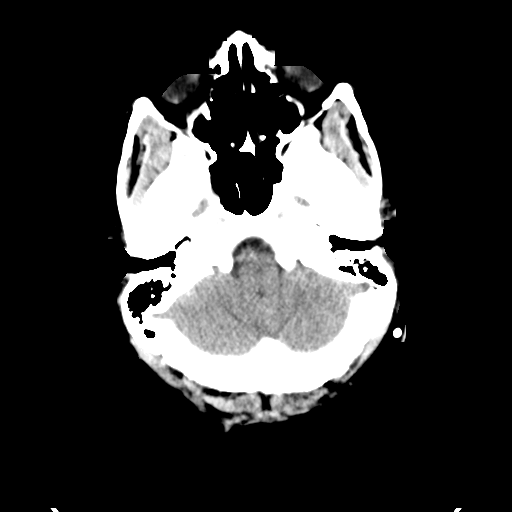
[im 8/32  brain]
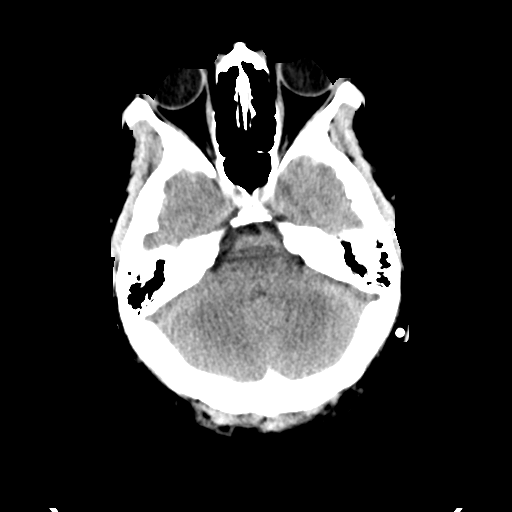
[im 9/32  brain]
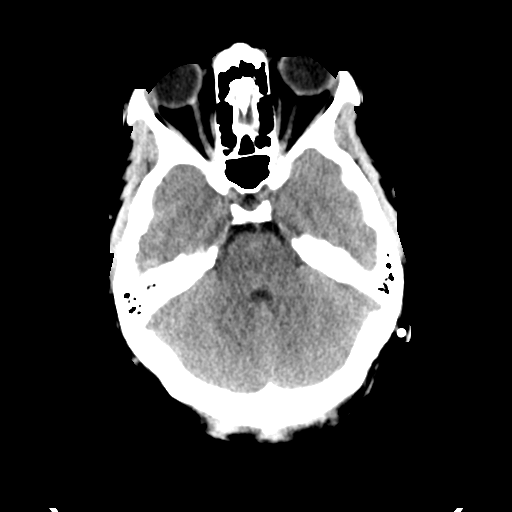
[im 9/32  bone]
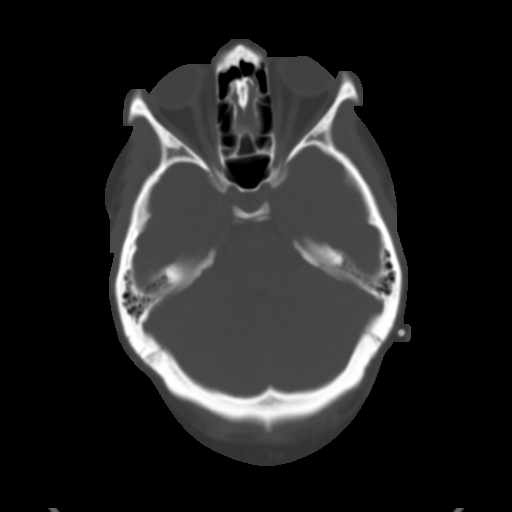
[im 11/32  brain]
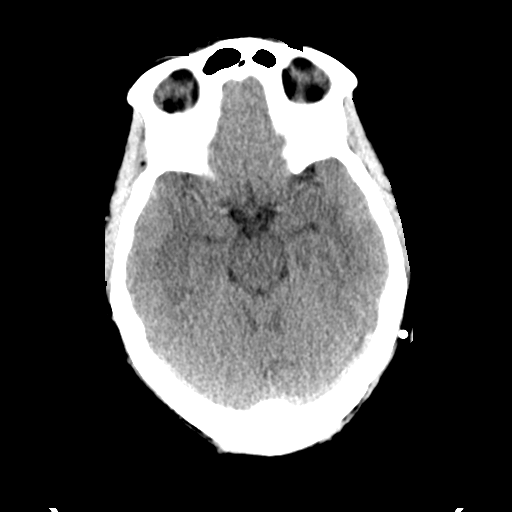
[im 13/32  brain]
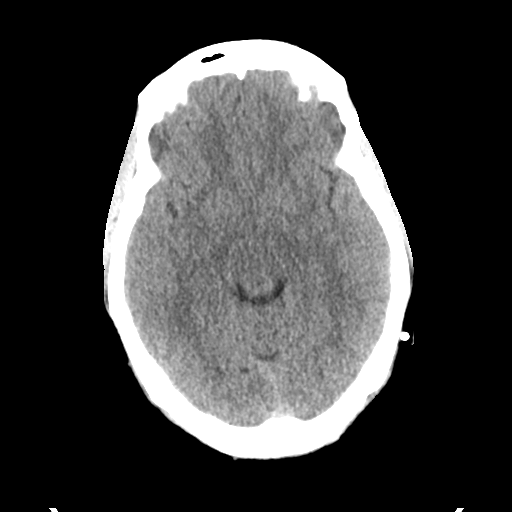
[im 15/32  brain]
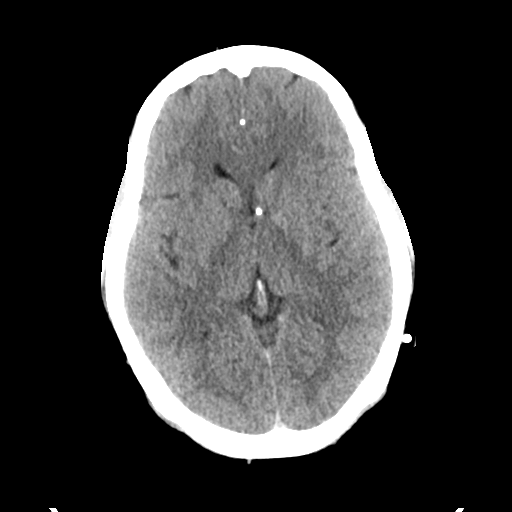
[im 17/32  brain]
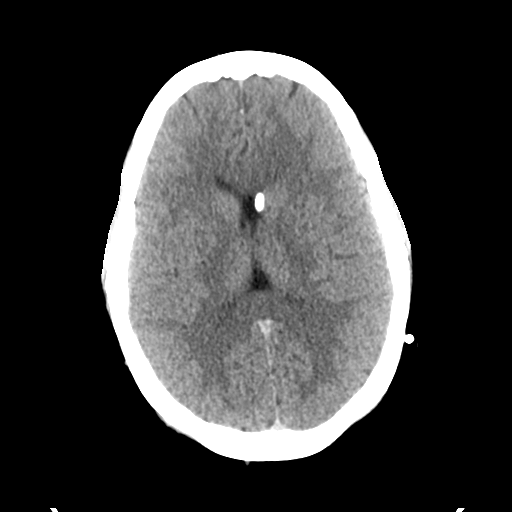
[im 17/32  bone]
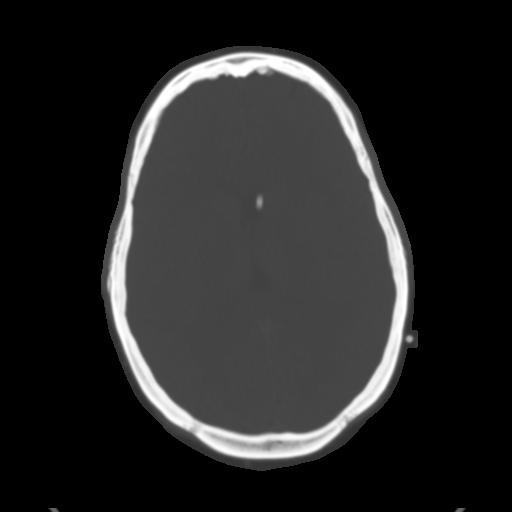
[im 19/32  brain]
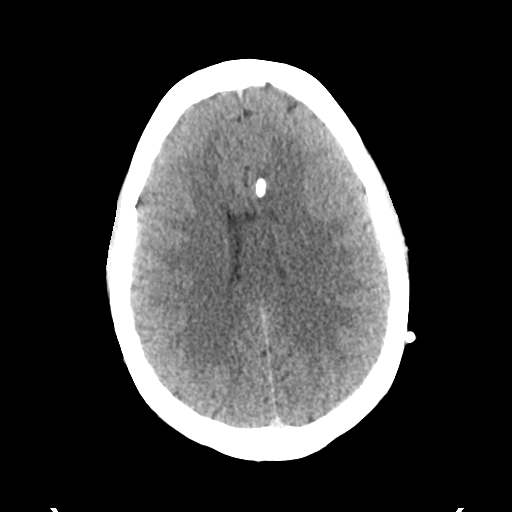
[im 21/32  brain]
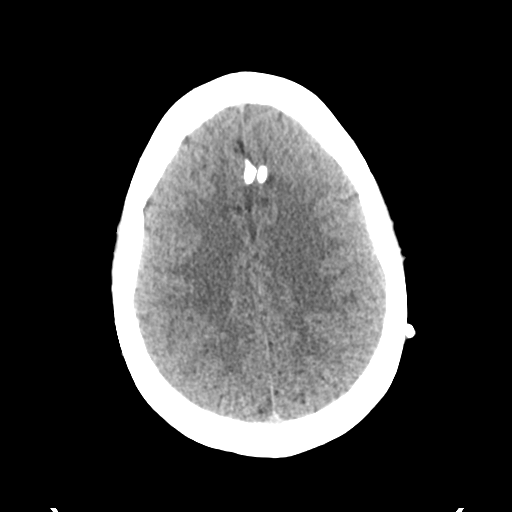
[im 23/32  brain]
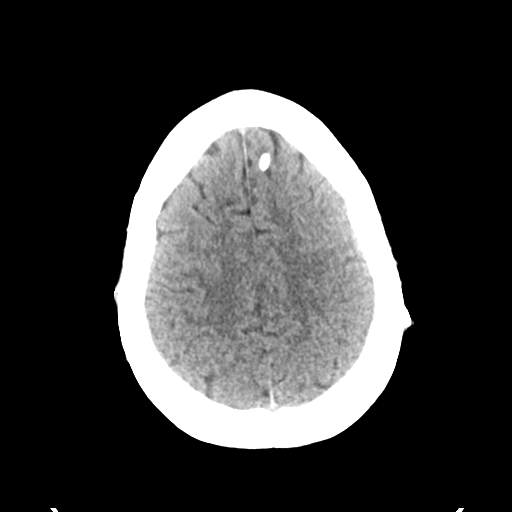
[im 24/32  brain]
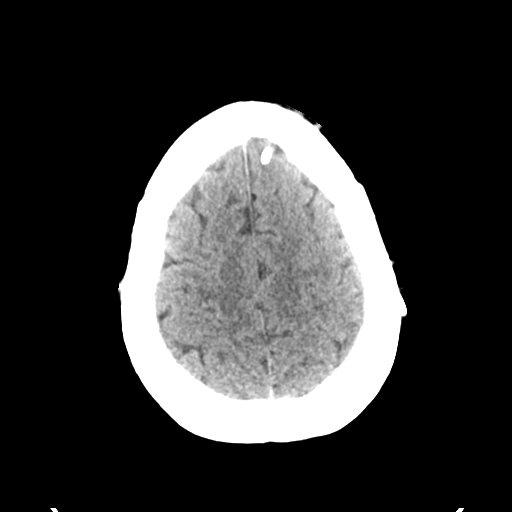
[im 24/32  bone]
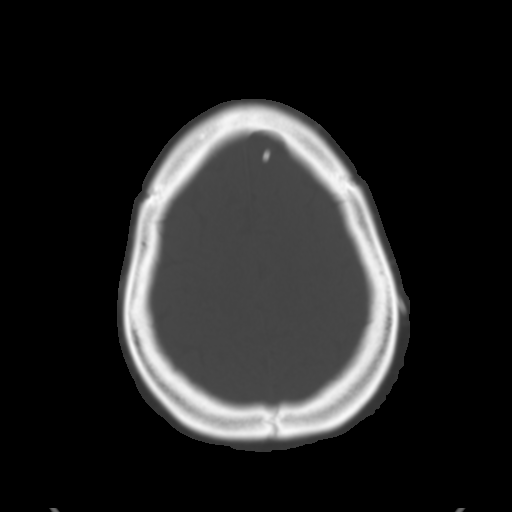
[im 26/32  brain]
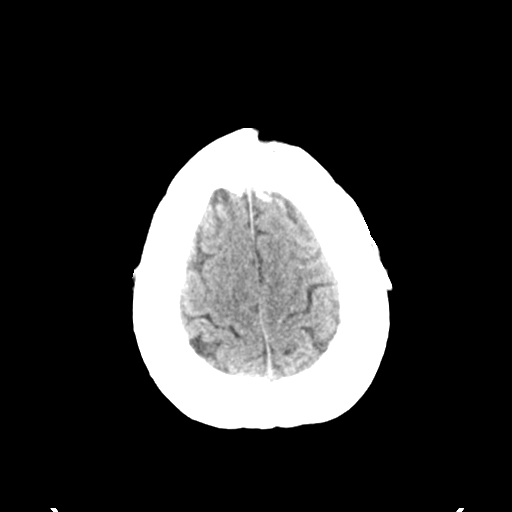
[im 28/32  brain]
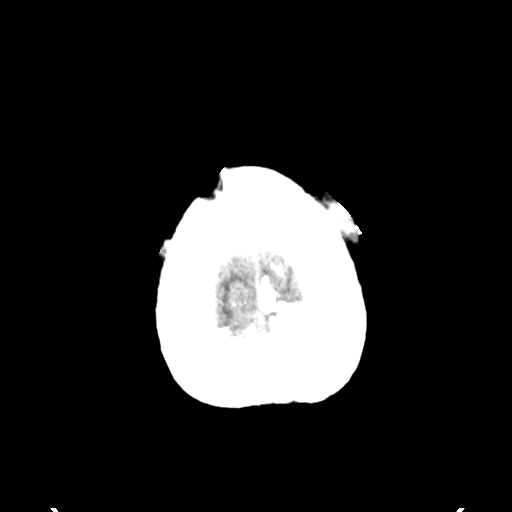
[im 30/32  brain]
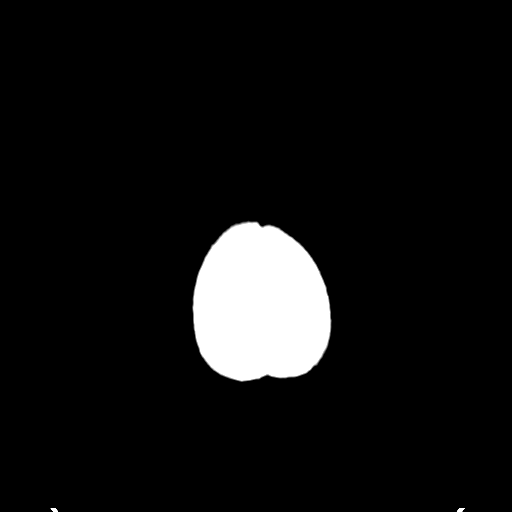

[16 of 30 positions shown; findings below may reference images not displayed]

FINDINGS: The patient has a left frontal approach ventriculostomy shunt
catheter the tip of the foramen of TARLA. Cavum septum pellucidum is
noted. The ventricles are well shunted. No evidence of acute
intracranial abnormality including hemorrhage, infarct, midline
shift, mass, mass effect or abnormal extra-axial fluid collection is
identified imaged paranasal sinuses and mastoid air cells are clear.
There is no fracture. Defect in the right frontal bone is consistent
with prior shunt.
IMPRESSION: No acute abnormality. Ventriculostomy shunt catheter in place.
Negative for hydrocephalus.

## 2015-05-17 ENCOUNTER — Encounter: Payer: Self-pay | Admitting: Family Medicine

## 2015-05-17 ENCOUNTER — Telehealth: Payer: Self-pay | Admitting: Family Medicine

## 2015-05-17 NOTE — Telephone Encounter (Signed)
Called patient to discuss her CT head results (below), which were normal. She had an appointment with her neurologist at the end of the month and she was encouraged to follow-up with them and let them know she had imaging completed. I will send a copy of results to her home as well.

## 2015-05-24 ENCOUNTER — Encounter: Payer: Self-pay | Admitting: Family Medicine

## 2015-05-24 ENCOUNTER — Ambulatory Visit (INDEPENDENT_AMBULATORY_CARE_PROVIDER_SITE_OTHER): Payer: Medicaid Other | Admitting: Family Medicine

## 2015-05-24 VITALS — BP 128/84 | HR 89 | Temp 98.0°F | Ht 69.0 in | Wt 314.1 lb

## 2015-05-24 DIAGNOSIS — J309 Allergic rhinitis, unspecified: Secondary | ICD-10-CM | POA: Insufficient documentation

## 2015-05-24 DIAGNOSIS — J302 Other seasonal allergic rhinitis: Secondary | ICD-10-CM | POA: Diagnosis present

## 2015-05-24 DIAGNOSIS — R42 Dizziness and giddiness: Secondary | ICD-10-CM | POA: Diagnosis not present

## 2015-05-24 MED ORDER — FLUTICASONE PROPIONATE 50 MCG/ACT NA SUSP: 2.0000 | Freq: Every day | NASAL | Status: DC

## 2015-05-24 NOTE — Assessment & Plan Note (Signed)
Monica Huang is  A 34 y.o. African-American female with continued dizziness, significant history of hypertension, diabetes, legally blind, ventricular communicating shunt. - CT head was normal, hemoglobin 13.7, electrolytes normal, creatinine 0.69, liver function normal, TSH normal, A1c 6.3. No History of hypoglycemia, blood sugars in range. - blood pressure is normal today after starting HCTZ 12.5 daily. - Patient does have symptoms of allergic rhinitis we'll start Flonase today. - Patient encouraged follow-up with her neurologist on Thursday. - Vestibular rehabilitation referral placed today. - Patient encouraged to continue hydration, by history and exam she appears well hydrated. - Could consider UDS with prior history of cocaine use, and THC. Although dizziness seems to mainly be caused with standing, despite normal orthostatics. - Follow-up as needed.

## 2015-05-24 NOTE — Progress Notes (Signed)
Subjective:    Patient ID: Monica Huang, female    DOB: 04-21-1981, 34 y.o.   MRN: [id number]  HPI  Dizziness: Dizziness started approximately the beginning of April, and patient complains of mostly upon standing. She had experienced dizziness once when sitting, that time last longest which was approximately 3 minutes. Usually the dizziness resolves in less than a minute. Have it resolved, she stands still puts her head down, this allows the dizziness the past and then she is able to walk is normal. A she is legally blind, with CSF ventricular shunt. Patient has a neurology follow-up in 3 days. CT head here was normal. CBC was normal, with the exception of mildly elevated lymphocytes. CMP was normal, electrolytes were normal. TSH was normal. A1c was 6.3, patient denies any hypoglycemic events in blood sugars are never below 90. She does have a past medical history of cocaine and THC use, but she states she has not used cocaine in many years that was only when she was a young girl. She sees the 12.5 was started last office visit, and blood pressure is in normal range today. She reports that she has had less episodes of dizziness since starting the HCTZ. She reports 3-5 episodes of dizziness a week, that usually last less than a minute. She denies any falls. She reports she drinks approximately 04-02-15 ounce bottles of water daily.   Current every day smoker  Past Medical History  Diagnosis Date  . Pseudotumor cerebri syndrome 2005    shunt placed- and legally blind  . Hyperlipidemia   . Hydradenitis   . Legally blind   . Chronic leg pain   . Chronic back pain   . Sciatica   . Diabetes mellitus without complication   . Macular degeneration    No Known Allergies Past Surgical History  Procedure Laterality Date  . Csf shunt      2 revisions    Review of Systems Per HPI    Objective:   Physical Exam BP 128/84 mmHg  Pulse 89  Temp(Src) 98 F (36.7 C) (Oral)  Ht 5\' 9"  (1.753 m)   Wt 314 lb 1.6 oz (142.475 kg)  BMI 46.36 kg/m2  LMP 04/21/2015 Gen: NAD. Nontoxic in appearance, well-developed, well-nourished, morbidly obese African-American female, legally blind, walks with a walking stick. HEENT: AT. [place]. Bilateral TM visualized and normal, but dull in appearance. Bilateral eyes without injections or icterus. MMM. Bilateral nares with mild to moderate erythema, large septal perforation present, no swelling. Throat without erythema or exudates.  CV: RRR no murmur appreciated Neuro: Normal gait (baseline), with assistance and walking stick to to be legally blind. PERLA. EOMi. Alert. Oriented. No focal deficits. She did become dizzy upon standing, stood still with head down for approximately 30 seconds and dizziness resolved. Psych: Normal affect, dress and demeanor. Normal speech.     Assessment & Plan:  Monica Huang is  A 34 y.o. African-American female with continued dizziness, significant history of hypertension, diabetes, legally blind, ventricular communicating shunt. - CT head was normal, hemoglobin 13.7, electrolytes normal, creatinine 0.69, liver function normal, TSH normal, A1c 6.3. No History of hypoglycemia, blood sugars in range. - blood pressure is normal today after starting HCTZ 12.5 daily. - Patient does have symptoms of allergic rhinitis we'll start Flonase today. - Patient encouraged follow-up with her neurologist on Thursday. - Vestibular rehabilitation referral placed today. - Patient encouraged to continue hydration, by history and exam she appears well hydrated. - Could consider UDS  with prior history of cocaine use, and THC. Although dizziness seems to mainly be caused with standing, despite normal orthostatics. - Follow-up as needed.

## 2015-05-24 NOTE — Patient Instructions (Signed)
We are going to start Flonase, you can use 1-2 sprays per nostril daily. Please follow-up with your neurologist on the 30th as planned. Continue taking the HCTZ for blood pressure.

## 2015-05-25 NOTE — Progress Notes (Signed)
I was the preceptor for this visit. 

## 2015-06-09 ENCOUNTER — Other Ambulatory Visit: Payer: Self-pay | Admitting: Family Medicine

## 2015-07-02 ENCOUNTER — Ambulatory Visit: Payer: Medicaid Other | Attending: Family Medicine | Admitting: Rehabilitative and Restorative Service Providers"

## 2015-07-12 ENCOUNTER — Other Ambulatory Visit: Payer: Self-pay | Admitting: *Deleted

## 2015-07-12 DIAGNOSIS — I10 Essential (primary) hypertension: Secondary | ICD-10-CM

## 2015-07-12 MED ORDER — HYDROCHLOROTHIAZIDE 12.5 MG PO TABS: 12.5000 mg | ORAL_TABLET | Freq: Every day | ORAL | Status: DC

## 2015-07-12 NOTE — Telephone Encounter (Signed)
Pt called and needs a refill on her BP medication called in. jw °

## 2015-07-16 ENCOUNTER — Ambulatory Visit: Payer: Medicaid Other | Admitting: Physical Therapy

## 2015-07-16 ENCOUNTER — Ambulatory Visit: Payer: Medicaid Other | Admitting: Rehabilitative and Restorative Service Providers"

## 2015-08-08 ENCOUNTER — Other Ambulatory Visit: Payer: Self-pay | Admitting: Family Medicine

## 2015-08-25 ENCOUNTER — Ambulatory Visit: Payer: Medicaid Other | Admitting: Family Medicine

## 2015-08-30 ENCOUNTER — Ambulatory Visit: Payer: Medicaid Other | Admitting: Family Medicine

## 2015-09-10 ENCOUNTER — Ambulatory Visit (INDEPENDENT_AMBULATORY_CARE_PROVIDER_SITE_OTHER): Payer: Medicaid Other | Admitting: Family Medicine

## 2015-09-10 VITALS — BP 128/78 | HR 94 | Temp 98.6°F | Wt 302.0 lb

## 2015-09-10 DIAGNOSIS — L732 Hidradenitis suppurativa: Secondary | ICD-10-CM | POA: Diagnosis not present

## 2015-09-10 DIAGNOSIS — E119 Type 2 diabetes mellitus without complications: Secondary | ICD-10-CM

## 2015-09-10 LAB — POCT GLYCOSYLATED HEMOGLOBIN (HGB A1C): Hemoglobin A1C: 6.1

## 2015-09-10 MED ORDER — SULFAMETHOXAZOLE-TRIMETHOPRIM 400-80 MG PO TABS: 1.0000 | ORAL_TABLET | Freq: Two times a day (BID) | ORAL | Status: DC

## 2015-09-10 NOTE — Patient Instructions (Signed)
Thank you so much for coming to visit me today! You are doing a great job controlling your diabetes! Your A1C was 6.1 today, improved from 6.3 at your last visit. Follow up in 6 months for an A1C check. Please continue to work on smoking cessation and if you would like to schedule a meeting with our pharmacist to discuss this, let me know. Please schedule an appointment at our dermatology clinic for a biopsy of the lesion on your left thigh.  Thanks again! Dr. Caroleen Hammanumley

## 2015-09-11 ENCOUNTER — Other Ambulatory Visit: Payer: Self-pay | Admitting: Family Medicine

## 2015-09-12 NOTE — Assessment & Plan Note (Signed)
-   A1C improved to 6.1 today from 6.3 - Continue metformin - Referral to Podiatry placed - Continue exercise - Follow up in 6months for A1C check

## 2015-09-12 NOTE — Progress Notes (Signed)
Subjective:     Patient ID: Monica Huang, female   DOB: 08-24-1981, 34 y.o.   MRN: [id number]  HPI Monica Huang is a 34yo female presenting today for diabetes follow up. - Reports history of hidradenitis. Currently has lesion between breasts and under left breast. Requests short course of Bactrim, which she states normally resolves these.  - Denies fever - Reports CBG are well controlled - Currently prescribed Metformin 1000mg  BID - Requests referral to podiatry - Last seen by ophthalmology a few weeks ago - Exercises by walking on treadmill 3330min/day  - Last A1C 6.3 on 05/10/15 - Last BMP normal in 04/2015 - Last Lipid panel normal in 02/2015 - Last CBC normal in 04/2015 - Last TSH normal in 04/2015 - Last Pap Smear in 02/2015  Review of Systems Per HPI    Objective:   Physical Exam  Constitutional: She appears well-developed and well-nourished. No distress.  HENT:  Head: Normocephalic and atraumatic.  Cardiovascular: Normal rate and regular rhythm.  Exam reveals no gallop and no friction rub.   No murmur heard. Pulmonary/Chest: Effort normal. No respiratory distress. She has no wheezes. She has no rales.  Abdominal: Soft. Bowel sounds are normal. She exhibits no distension. There is no tenderness. There is no rebound.  Musculoskeletal: She exhibits no edema.  Neurological: She is alert.  No decreased sensation of feet bilaterally  Skin:  No foot ulcers noted, boil noted between breasts and under left breast  Psychiatric: She has a normal mood and affect. Her behavior is normal.       Assessment and Plan:     Diabetes type 2, controlled - A1C improved to 6.1 today from 6.3 - Continue metformin - Referral to Podiatry placed - Continue exercise - Follow up in 6months for A1C check  Hidradenitis - Lesion noted between breasts and under left breast - 10day course of bactrim given

## 2015-09-12 NOTE — Assessment & Plan Note (Signed)
-   Lesion noted between breasts and under left breast - 10day course of bactrim given

## 2015-09-26 ENCOUNTER — Other Ambulatory Visit: Payer: Self-pay | Admitting: Family Medicine

## 2015-10-04 DIAGNOSIS — H47293 Other optic atrophy, bilateral: Secondary | ICD-10-CM | POA: Insufficient documentation

## 2015-10-18 ENCOUNTER — Encounter: Payer: Self-pay | Admitting: Podiatry

## 2015-10-18 ENCOUNTER — Ambulatory Visit (INDEPENDENT_AMBULATORY_CARE_PROVIDER_SITE_OTHER): Payer: Medicaid Other | Admitting: Podiatry

## 2015-10-18 DIAGNOSIS — M79676 Pain in unspecified toe(s): Secondary | ICD-10-CM

## 2015-10-18 DIAGNOSIS — B351 Tinea unguium: Secondary | ICD-10-CM | POA: Diagnosis not present

## 2015-10-18 DIAGNOSIS — L600 Ingrowing nail: Secondary | ICD-10-CM

## 2015-10-18 NOTE — Progress Notes (Signed)
Patient ID: Monica Huang, female   DOB: 1981/05/25, 34 y.o.   MRN: [id number]  Subjective: 34 y.o. returns the office today for painful, elongated, thickened toenails which she cannot trim themself. Denies any redness or drainage around the nails. Denies any acute changes since last appointment and no new complaints today. Denies any systemic complaints such as fevers, chills, nausea, vomiting.   Objective: AAO 3, NAD; blind  DP/PT pulses palpable, CRT less than 3 seconds Protective sensation intact with Simms Weinstein monofilament, Achilles tendon reflex intact.  Nails hypertrophic, dystrophic, elongated, brittle, discolored 10. There is incurvation of the medial and lateral borders of the hallux toenails. There is tenderness overlying the nails 1-5 bilaterally. There is no surrounding erythema or drainage along the nail sites. No open lesions or pre-ulcerative lesions are identified. No other areas of tenderness bilateral lower extremities. No overlying edema, erythema, increased warmth. No pain with calf compression, swelling, warmth, erythema.  Assessment: Patient presents with symptomatic onychomycosis  Plan: -Treatment options including alternatives, risks, complications were discussed -Nails sharply debrided 10 without complication/bleeding. -Discussed daily foot inspection. If there are any changes, to call the office immediately.  -Follow-up in 3 months or sooner if any problems are to arise. In the meantime, encouraged to call the office with any questions, concerns, changes symptoms.  Ovid CurdMatthew Lamir Racca, DPM

## 2015-12-21 ENCOUNTER — Encounter (HOSPITAL_COMMUNITY): Payer: Self-pay | Admitting: Emergency Medicine

## 2015-12-21 ENCOUNTER — Emergency Department (HOSPITAL_COMMUNITY)
Admission: EM | Admit: 2015-12-21 | Discharge: 2015-12-21 | Disposition: A | Discharge status: Home / Self Care | Payer: Medicaid Other | Attending: Emergency Medicine | Admitting: Emergency Medicine

## 2015-12-21 DIAGNOSIS — Z872 Personal history of diseases of the skin and subcutaneous tissue: Secondary | ICD-10-CM | POA: Insufficient documentation

## 2015-12-21 DIAGNOSIS — Z3202 Encounter for pregnancy test, result negative: Secondary | ICD-10-CM | POA: Insufficient documentation

## 2015-12-21 DIAGNOSIS — M5430 Sciatica, unspecified side: Secondary | ICD-10-CM | POA: Diagnosis not present

## 2015-12-21 DIAGNOSIS — G8929 Other chronic pain: Secondary | ICD-10-CM | POA: Diagnosis not present

## 2015-12-21 DIAGNOSIS — R55 Syncope and collapse: Secondary | ICD-10-CM | POA: Insufficient documentation

## 2015-12-21 DIAGNOSIS — H548 Legal blindness, as defined in USA: Secondary | ICD-10-CM | POA: Diagnosis not present

## 2015-12-21 DIAGNOSIS — E119 Type 2 diabetes mellitus without complications: Secondary | ICD-10-CM | POA: Diagnosis not present

## 2015-12-21 DIAGNOSIS — Z79899 Other long term (current) drug therapy: Secondary | ICD-10-CM | POA: Insufficient documentation

## 2015-12-21 DIAGNOSIS — F17210 Nicotine dependence, cigarettes, uncomplicated: Secondary | ICD-10-CM | POA: Insufficient documentation

## 2015-12-21 LAB — CBC WITH DIFFERENTIAL/PLATELET
Basophils Absolute: 0.1 10*3/uL (ref 0.0–0.1)
Basophils Relative: 1 %
EOS PCT: 1 %
Eosinophils Absolute: 0.2 10*3/uL (ref 0.0–0.7)
HCT: 42.7 % (ref 36.0–46.0)
Hemoglobin: 14.6 g/dL (ref 12.0–15.0)
LYMPHS ABS: 5.5 10*3/uL — AB (ref 0.7–4.0)
LYMPHS PCT: 51 %
MCH: 32.7 pg (ref 26.0–34.0)
MCHC: 34.2 g/dL (ref 30.0–36.0)
MCV: 95.7 fL (ref 78.0–100.0)
MONO ABS: 0.6 10*3/uL (ref 0.1–1.0)
Monocytes Relative: 6 %
Neutro Abs: 4.3 10*3/uL (ref 1.7–7.7)
Neutrophils Relative %: 41 %
PLATELETS: 361 10*3/uL (ref 150–400)
RBC: 4.46 MIL/uL (ref 3.87–5.11)
RDW: 13.7 % (ref 11.5–15.5)
WBC: 10.7 10*3/uL — ABNORMAL HIGH (ref 4.0–10.5)

## 2015-12-21 LAB — BASIC METABOLIC PANEL
Anion gap: 12 (ref 5–15)
BUN: 11 mg/dL (ref 6–20)
CO2: 24 mmol/L (ref 22–32)
Calcium: 9.3 mg/dL (ref 8.9–10.3)
Chloride: 102 mmol/L (ref 101–111)
Creatinine, Ser: 0.85 mg/dL (ref 0.44–1.00)
GFR calc Af Amer: >60 mL/min (ref >60)
GLUCOSE: 84 mg/dL (ref 65–99)
POTASSIUM: 3.8 mmol/L (ref 3.5–5.1)
Sodium: 138 mmol/L (ref 135–145)

## 2015-12-21 LAB — CBG MONITORING, ED: Glucose-Capillary: 90 mg/dL (ref 65–99)

## 2015-12-21 LAB — I-STAT BETA HCG BLOOD, ED (MC, WL, AP ONLY): I-stat hCG, quantitative: <5 m[IU]/mL (ref <5)

## 2015-12-21 MED ORDER — SODIUM CHLORIDE 0.9 % IV BOLUS (SEPSIS)
1000.0000 mL | Freq: Once | INTRAVENOUS | Status: AC
  Administered 2015-12-21: 1000 mL via INTRAVENOUS

## 2015-12-21 NOTE — ED Notes (Signed)
Pt to ER via [facility] from [facility] - ems was called out for near syncopal, pt denies LOC, pt reports she stood up and felt hot and dizzy, pt lowered herself to the ground. A/o x4. VS - 120/90, 79, CBG 96, 100%. Pt reports 2 menstrual cycles this month that have been heavier than in the past. Pt denies pain.

## 2015-12-21 NOTE — Discharge Instructions (Signed)

## 2015-12-21 NOTE — ED Notes (Signed)
Dr. kohut at the bedside.  

## 2015-12-22 ENCOUNTER — Telehealth: Payer: Self-pay | Admitting: Family Medicine

## 2015-12-22 NOTE — ED Provider Notes (Signed)
CSN: [id number]     Arrival date & time 12/21/15  1654 History   First MD Initiated Contact with Patient 12/21/15 1706     Chief Complaint  Patient presents with  . Loss of Consciousness     (Consider location/radiation/quality/duration/timing/severity/associated sxs/prior Treatment) HPI   35 year old female with no with a past medical history of blindness and pseudotumor who comes in with a presyncopal episode. Patient stood up felt hot and lightheaded and had an episode of near syncope.  She crouched down, came back to baseline now feels fine.  She has had heavy menstrual periods recently.    Past Medical History  Diagnosis Date  . Pseudotumor cerebri syndrome 2005    shunt placed- and legally blind  . Hyperlipidemia   . Hydradenitis   . Legally blind   . Chronic leg pain   . Chronic back pain   . Sciatica   . Diabetes mellitus without complication (HCC)   . Macular degeneration    Past Surgical History  Procedure Laterality Date  . Csf shunt      2 revisions   Family History  Problem Relation Age of Onset  . Diabetes Mother   . Hypertension Mother   . Diabetes Father    Social History  Substance Use Topics  . Smoking status: Current Every Day Smoker -- 0.50 packs/day    Types: Cigarettes  . Smokeless tobacco: None     Comment: in process of quitting  . Alcohol Use: No   OB History    No data available     Review of Systems  Constitutional: Negative for fever and chills.  HENT: Negative for nosebleeds.   Eyes: Negative for visual disturbance.  Respiratory: Negative for cough and shortness of breath.   Cardiovascular: Negative for chest pain.  Gastrointestinal: Negative for nausea, vomiting, abdominal pain, diarrhea and constipation.  Genitourinary: Negative for dysuria.  Skin: Negative for rash.  Neurological: Negative for weakness.  All other systems reviewed and are negative.     Allergies  Review of patient's allergies indicates no known  allergies.  Home Medications   Prior to Admission medications   Medication Sig Start Date End Date Taking? Authorizing Provider  hydrochlorothiazide (HYDRODIURIL) 12.5 MG tablet TAKE 1 TABLET (12.5 MG TOTAL) BY MOUTH DAILY. 09/27/15  Yes [place] N Rumley, DO  ibuprofen (ADVIL,MOTRIN) 200 MG tablet Take 200 mg by mouth every 6 (six) hours as needed for mild pain or moderate pain.   Yes Historical Provider, MD  metFORMIN (GLUCOPHAGE) 1000 MG tablet TAKE 1 TABLET BY MOUTH TWICE A DAY WITH A MEAL 01/14/15  Yes Renee A Kuneff, DO  fluticasone (FLONASE) 50 MCG/ACT nasal spray Place 2 sprays into both nostrils daily. Patient not taking: Reported on 12/21/2015 05/24/15   Renee A Kuneff, DO  metFORMIN (GLUCOPHAGE) 1000 MG tablet TAKE 1 TABLET BY MOUTH TWICE A DAY WITH A MEAL Patient not taking: Reported on 12/21/2015 09/13/15   [place] N Rumley, DO  sulfamethoxazole-trimethoprim (BACTRIM,SEPTRA) 400-80 MG tablet Take 1 tablet by mouth 2 (two) times daily. Patient not taking: Reported on 12/21/2015 09/10/15   [place] N Rumley, DO   BP 113/90 mmHg  Pulse 80  Temp(Src) 98.2 F (36.8 C) (Oral)  Resp 27  SpO2 100%  LMP 12/21/2015 Physical Exam  Constitutional: She is oriented to person, place, and time. No distress.  HENT:  Head: Normocephalic and atraumatic.  Neck: Normal range of motion. Neck supple.  Cardiovascular: Normal rate, normal heart sounds and intact distal  pulses.   Pulmonary/Chest: No respiratory distress.  Abdominal: Soft. There is no tenderness.  Musculoskeletal: Normal range of motion.  Neurological: She is alert and oriented to person, place, and time. She has normal strength. No sensory deficit. Coordination normal.  Skin: No rash noted. She is not diaphoretic.  Psychiatric: She has a normal mood and affect.    ED Course  Procedures (including critical care time) Labs Review Labs Reviewed  CBC WITH DIFFERENTIAL/PLATELET - Abnormal; Notable for the following:    WBC 10.7 (*)     Lymphs Abs 5.5 (*)    All other components within normal limits  BASIC METABOLIC PANEL  CBG MONITORING, ED  I-STAT BETA HCG BLOOD, ED ([facility], [facility], [facility] ONLY)    Imaging Review No results found. I have personally reviewed and evaluated these images and lab results as part of my medical decision-making.   EKG Interpretation   Date/Time:  Tuesday December 21 2015 17:29:30 EST Ventricular Rate:  83 PR Interval:  233 QRS Duration: 91 QT Interval:  378 QTC Calculation: 444 R Axis:   -8 Text Interpretation:  Sinus rhythm Prolonged PR interval Probable left  atrial enlargement Confirmed by Juleen China  MD, STEPHEN ([id number]) on 12/21/2015  6:43:28 PM      MDM   Final diagnoses:  Syncope, near    35 year old female with no with a past medical history of blindness and pseudotumor who comes in with a presyncopal episode. Exam as above unremarkable. EKG without WPW, long QT or Brugada she does have first-degree AV block.he does heavy menstrual periods we'll obtain CBC, BMP, U peg.  CBC, BMP, upreg unremarkable. Given patient's age and lack of risk factors, doubt cardiogenic causes of her syncope. Her syncope also sounds vagal by history.  At this point using the [place] syncope rules, feel safe for d/c with outpatient f/u  I have discussed the results, Dx and Tx plan with the pt. They expressed understanding and agree with the plan and were told to return to ED with any worsening of condition or concern.    Disposition: Discharge  Condition: Good  Discharge Medication List as of 12/21/2015  7:09 PM      Follow Up: [facility] EMERGENCY DEPARTMENT [address] [id number] mc [place] [address] [phone]  As needed, If symptoms worsen   Pt seen in conjunction with Dr. Carmon Ginsberg, MD 12/22/15 0865  Raeford Razor, MD 12/29/15 1001

## 2015-12-22 NOTE — Telephone Encounter (Signed)
Pt calling and states that she was recently in the hospital and would like to know if PCP can access her EKG results and determine whether or not she needs to see a specialist. Monica Huang, ASA

## 2015-12-22 NOTE — Telephone Encounter (Signed)
Will forward to Dr. Caroleen Hamman to look at EKG. Breyanna Valera,CMA

## 2015-12-28 NOTE — Telephone Encounter (Signed)
Does not require Cardiology referral at this time.

## 2015-12-29 ENCOUNTER — Encounter: Payer: Self-pay | Admitting: Family Medicine

## 2015-12-29 ENCOUNTER — Ambulatory Visit (INDEPENDENT_AMBULATORY_CARE_PROVIDER_SITE_OTHER): Payer: Medicaid Other | Admitting: Family Medicine

## 2015-12-29 VITALS — BP 135/83 | HR 97 | Temp 98.1°F | Ht 69.0 in | Wt 295.0 lb

## 2015-12-29 DIAGNOSIS — R55 Syncope and collapse: Secondary | ICD-10-CM

## 2015-12-29 NOTE — Telephone Encounter (Signed)
LM for pt. Monica Huang, CMA

## 2015-12-29 NOTE — Progress Notes (Signed)
   HPI  CC: "passed out" follow up Follow up for some episodes of dizziness. Was at work, had some dizziness, leaned forward and passed out. Denies head trauma. This has happened in the past w/o full syncope. Can effect her for majority of some days. Might occur 10 days out of month; has been going on for years. ALWAYS happens when standing. Does not seem to exacerbate w/ head turning.  Typically occurs while at work. Endorses some diaphoresis, and anxiety w/ these sxs. No N/V.  No CP, SOB, HA. Has had feelings of increased heart rate (can last ~2-3 minutes) no association w/ her dizziness.   Review of Systems   See HPI for ROS. All other systems reviewed and are negative.  Objective: BP 135/83 mmHg  Pulse 97  Temp(Src) 98.1 F (36.7 C) (Oral)  Ht  (1.753 m)  Wt 295 lb (133.811 kg)  BMI 43.54 kg/m2  LMP 12/21/2015 Gen: NAD, alert, cooperative, pleasant, morbidly obese, visually impaired. HEENT: NCAT, no JVD, no bruit CV: RRR, no murmur, S1 and S2 wnl, peripheral pulses equal bilaterally Resp: CTAB, no wheezes, non-labored Ext: No edema, warm Neuro: Alert and oriented, Speech clear, CNII w/ baseline deficits, CNIII-XII intact  Assessment and plan:  Syncope and collapse Patient is here for follow-up from a syncopal event from last week. Etiology currently unknown. Differential includes vasovagal, autonomic dysfunction secondary to diabetes, carotid sinus hypersensitivity, hypoglycemia, hypotension, and arrhythmia. No evidence of hypotensive events. Patient is not on any medications that may produce hypoglycemia. Patient's symptoms appear to be relatively chronic in nature. This would make carotid sinus hypersensitivity and vasovagal response most likely. However, EKG showed evidence of atrial enlargement with a left axis deviation. Atrial hypertrophy can increase the risk of arrhythmias. - We will refer to cardiology. - Strong consideration for Holter monitor and/or  echocardiogram    Orders Placed This Encounter  Procedures  . Ambulatory referral to Cardiology    Referral Priority:  Routine    Referral Type:  Consultation    Referral Reason:  Specialty Services Required    Requested Specialty:  Cardiology    Number of Visits Requested:  1    Kathee Delton, MD,MS,  PGY2 12/29/2015 5:46 PM

## 2015-12-29 NOTE — Assessment & Plan Note (Signed)
Patient is here for follow-up from a syncopal event from last week. Etiology currently unknown. Differential includes vasovagal, autonomic dysfunction secondary to diabetes, carotid sinus hypersensitivity, hypoglycemia, hypotension, and arrhythmia. No evidence of hypotensive events. Patient is not on any medications that may produce hypoglycemia. Patient's symptoms appear to be relatively chronic in nature. This would make carotid sinus hypersensitivity and vasovagal response most likely. However, EKG showed evidence of atrial enlargement with a left axis deviation. Atrial hypertrophy can increase the risk of arrhythmias. - We will refer to cardiology. - Strong consideration for Holter monitor and/or echocardiogram

## 2015-12-29 NOTE — Patient Instructions (Signed)
It was a pleasure seeing you today in our clinic. Today we discussed  Your fainting spell. Here is the treatment plan we have discussed and agreed upon together:   -  I placed a referral to cardiology. I would like to have you evaluated for possible heart arrhythmia. You will be contacted  In the next few days to arrange an appointment with the cardiologist. -  Continue taking your blood pressure medicine at this time. -  He have any repeated symptoms or fainting spells do not hesitate to report to the nearest emergency department.  - If you have any other questions please contact our office

## 2015-12-31 ENCOUNTER — Ambulatory Visit (INDEPENDENT_AMBULATORY_CARE_PROVIDER_SITE_OTHER): Payer: Medicaid Other | Admitting: Cardiovascular Disease

## 2015-12-31 ENCOUNTER — Encounter: Payer: Self-pay | Admitting: Cardiovascular Disease

## 2015-12-31 VITALS — BP 128/90 | HR 82 | Ht 69.0 in | Wt 297.7 lb

## 2015-12-31 DIAGNOSIS — R42 Dizziness and giddiness: Secondary | ICD-10-CM | POA: Diagnosis not present

## 2015-12-31 DIAGNOSIS — Z720 Tobacco use: Secondary | ICD-10-CM | POA: Diagnosis not present

## 2015-12-31 DIAGNOSIS — I10 Essential (primary) hypertension: Secondary | ICD-10-CM

## 2015-12-31 NOTE — Patient Instructions (Signed)
Medication Instructions:  Your physician recommends that you continue on your current medications as directed. Please refer to the Current Medication list given to you today.   Labwork: none  Testing/Procedures: none  Follow-Up: Follow up with Dr. Berry as needed.   Any Other Special Instructions Will Be Listed Below (If Applicable).     If you need a refill on your cardiac medications before your next appointment, please call your pharmacy.   

## 2015-12-31 NOTE — Assessment & Plan Note (Signed)
History of hypertension blood pressure measured today at 120/90. She is on hydrochlorothiazide. Continue current medications

## 2015-12-31 NOTE — Assessment & Plan Note (Signed)
History of tobacco abuse currently smoking one half pack per day for last 20 years interested in stopping smoking

## 2015-12-31 NOTE — Assessment & Plan Note (Signed)
Monica Huang is referred by [facility] outpatient [facility] for evaluation of dizziness. She has a history of non-insulin requiring diabetes. She is legally line from pseudotumor cerebri. She has no other cardiac risk factors other than tobacco abuse. She denies chest pain or shortness of breath. EKG shows left atrial enlargement. Her dizziness occurs when she stands up especially after sitting down for long periods of time  Probably exacerbated by her blindness. I have no suspicion of tachycardia or bradycardia arrhythmias. I do not think she needs an event monitor or a 2-D echocardiogram.

## 2015-12-31 NOTE — Progress Notes (Signed)
12/31/2015 Monica Huang   1981-06-15  [id number]  Primary Physician Garry Heater, DO Primary Cardiologist: Runell Gess MD Monica Huang   HPI:  Monica Huang is a 35 year old morbidly overweight single African-American female who is legally blind secondary to pseudotumor cerebri. She was referred for evaluation of presyncope. She has a history of hypertension on diuretics and non-insulin requiring diabetes. She does smoke one half pack per day. She's never had a heart attack or stroke and denies chest pain or shortness of breath. 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 She sits the majority of the day. She says she feels dizzy when she gets up. Cardiac exam is benign. EKG shows sinus rhythm with left atrial enlargement.   Current Outpatient Prescriptions  Medication Sig Dispense Refill  . hydrochlorothiazide (HYDRODIURIL) 12.5 MG tablet TAKE 1 TABLET (12.5 MG TOTAL) BY MOUTH DAILY. 90 tablet 3  . ibuprofen (ADVIL,MOTRIN) 200 MG tablet Take 200 mg by mouth every 6 (six) hours as needed for mild pain or moderate pain.    . metFORMIN (GLUCOPHAGE) 1000 MG tablet TAKE 1 TABLET BY MOUTH TWICE A DAY WITH A MEAL 60 tablet 6   No current facility-administered medications for this visit.    No Known Allergies  Social History   Social History  . Marital Status: Single    Spouse Name: N/A  . Number of Children: N/A  . Years of Education: N/A   Occupational History  . Not on file.   Social History Main Topics  . Smoking status: Current Every Day Smoker -- 0.50 packs/day    Types: Cigarettes  . Smokeless tobacco: Not on file     Comment: in process of quitting  . Alcohol Use: No  . Drug Use: No     Comment: marijuana -- bag per day.   Marland Kitchen Sexual Activity: Yes    Birth Control/ Protection: None     Comment: 1 partners   Other Topics Concern  . Not on file   Social History Narrative   On disability since 2005 due to vision loss from psuedotumor  cerebri.   Did not finish highschool-- completed the 9th grade.    Walking 2 x week for .            Review of Systems: General: negative for chills, fever, night sweats or weight changes.  Cardiovascular: negative for chest pain, dyspnea on exertion, edema, orthopnea, palpitations, paroxysmal nocturnal dyspnea or shortness of breath Dermatological: negative for rash Respiratory: negative for cough or wheezing Urologic: negative for hematuria Abdominal: negative for nausea, vomiting, diarrhea, bright red blood per rectum, melena, or hematemesis Neurologic: negative for visual changes, syncope, or dizziness All other systems reviewed and are otherwise negative except as noted above.    Blood pressure 128/90, pulse 82, height  (1.753 m), weight 297 lb 11.2 oz (135.036 kg), last menstrual period 12/21/2015.  General appearance: alert and no distress Neck: no adenopathy, no carotid bruit, no JVD, supple, symmetrical, trachea midline and thyroid not enlarged, symmetric, no tenderness/mass/nodules Lungs: clear to auscultation bilaterally Heart: regular rate and rhythm, S1, S2 normal, no murmur, click, rub or gallop Extremities: extremities normal, atraumatic, no cyanosis or edema  EKG not performed today  ASSESSMENT AND PLAN:   Tobacco abuse History of tobacco abuse currently smoking one half pack per day for last 20 years interested in stopping smoking  Dizziness  Monica Huang is referred by [facility] for evaluation of dizziness.  She has a history of non-insulin requiring diabetes. She is legally line from pseudotumor cerebri. She has no other cardiac risk factors other than tobacco abuse. She denies chest pain or shortness of breath. EKG shows left atrial enlargement. Her dizziness occurs when she stands up especially after sitting down for long periods of time  Probably exacerbated by her blindness. I have no suspicion of tachycardia or bradycardia  arrhythmias. I do not think she needs an event monitor or a 2-D echocardiogram.  Essential hypertension, benign History of hypertension blood pressure measured today at 120/90. She is on hydrochlorothiazide. Continue current medications      Runell Gess MD [facility], [facility] 12/31/2015 2:58 PM

## 2016-01-18 ENCOUNTER — Telehealth: Payer: Self-pay | Admitting: Family Medicine

## 2016-01-18 ENCOUNTER — Ambulatory Visit: Payer: Medicaid Other | Admitting: Podiatry

## 2016-01-18 NOTE — Telephone Encounter (Signed)
FMLA form dropped off to be completed.  Please call when ready to be picked up.

## 2016-01-18 NOTE — Telephone Encounter (Signed)
Form placed on PCP box for completion. Zimmerman Rumple, Moustapha Tooker D, CMA  

## 2016-01-20 NOTE — Telephone Encounter (Signed)
Will need appointment to be seen. Note that form dropped off at office on 01/18/16 and requests form to be returned by 01/13/16.

## 2016-01-26 NOTE — Telephone Encounter (Signed)
Has appt 01/28/16. Miata Culbreth, Maryjo Rochester, CMA

## 2016-01-28 ENCOUNTER — Ambulatory Visit (INDEPENDENT_AMBULATORY_CARE_PROVIDER_SITE_OTHER): Payer: Medicaid Other | Admitting: Family Medicine

## 2016-01-28 VITALS — BP 123/76 | HR 93 | Temp 98.1°F | Wt 296.0 lb

## 2016-01-28 DIAGNOSIS — R42 Dizziness and giddiness: Secondary | ICD-10-CM

## 2016-01-28 MED ORDER — CARBAMIDE PEROXIDE 6.5 % OT SOLN
5.0000 [drp] | Freq: Two times a day (BID) | OTIC | Status: DC
Start: 2016-01-28 — End: 2016-11-10

## 2016-01-28 MED ORDER — MECLIZINE HCL 25 MG PO TABS: 25.0000 mg | ORAL_TABLET | Freq: Three times a day (TID) | ORAL | Status: DC | PRN

## 2016-01-28 NOTE — Patient Instructions (Signed)
Thank you so much for coming to visit today! I will give you an ear drop to help with clogging of ears and a medicine to help with dizziness. I will refer you to physical therapy for vestibular rehab. You may look up videos of Dr. Mercy RidingFoster's Vertigo Maneuver or the Epley's Maneuver to do at home. You may repeat these several times a day. Return in one week if no improvement.  Thanks again! Dr. Caroleen Hammanumley

## 2016-01-31 ENCOUNTER — Telehealth: Payer: Self-pay | Admitting: *Deleted

## 2016-01-31 NOTE — Progress Notes (Signed)
Subjective:     Patient ID: Monica Huang, female   DOB: 08-05-1981, 35 y.o.   MRN: [id number]  HPI [name] is a 35yo female presenting today to follow up dizziness. - Dizziness significantly limits her ability to work throughout the day. Work has requested she fill out [employer]ML[employer] paperwork. - Occurs several times daily at work, usually about 10 days out of the month. Rarely on weekends. - Feels like the room is spinning, even though she admits she is blind - Usually happens when rising from sitting. Also happens occasionally while sitting if she turns her head. Occasionally even while in bed. - Notes occasional nausea, denies vomiting, denies ringing in ears, denies chest pain, denies shortness of breath - Evaluated by Cardiology on 12/31/15. Do not feel this is a cardiac etiology. - Smoker  Review of Systems Per HPI. Other systems negative.    Objective:   Physical Exam  Constitutional: She is oriented to person, place, and time. She appears well-developed and well-nourished. No distress.  HENT:  Head: Normocephalic and atraumatic.  Cerumen blocking view of right tympanic membrane, left tympanic membrane normal  Eyes:  Blindness. Unable to test EOM for nystagmus component completely as unable to follow light. Negative head jerk test. Monica Huang without nystagmus, but did report delayed reproduction of symptoms to left.  Cardiovascular: Normal rate and regular rhythm.  Exam reveals no gallop and no friction rub.   No murmur heard. Pulmonary/Chest: Effort normal. No respiratory distress. She has no wheezes.  Abdominal: Soft. She exhibits no distension. There is no tenderness.  Neurological: She is alert and oriented to person, place, and time. No cranial nerve deficit.  Skin: No rash noted.  Psychiatric: She has a normal mood and affect. Her behavior is normal.      Assessment and Plan:     Dizziness  - Several etiologies possible, including BPPV, inner ear dysfunction, anxiety -  Cardiology workup normal. - Orthostatics normal - Prescription for Meclizine given - Referral to Vestibular Rehab. Home exercises given for BPPV. - Prescription for Debrox given - Follow up in one week if no improvement

## 2016-01-31 NOTE — Telephone Encounter (Signed)
Left voice message for patient that FMLA forms are complete and ready for pick up.  Forms copied for scanning in patient's record.  Clovis PuMartin, Rachelann Enloe L, RN

## 2016-01-31 NOTE — Assessment & Plan Note (Signed)
-   Several etiologies possible, including BPPV, inner ear dysfunction, anxiety - Cardiology workup normal. - Orthostatics normal - Prescription for Meclizine given - Referral to Vestibular Rehab. Home exercises given for BPPV. - Prescription for Debrox given - Follow up in one week if no improvement

## 2016-02-02 ENCOUNTER — Telehealth: Payer: Self-pay | Admitting: Family Medicine

## 2016-02-02 NOTE — Telephone Encounter (Signed)
Patient requesting that FMLA forms that are left up front be faxed to her employer.  Please send to [phone] attn: Darden AmberBrenda Lane.

## 2016-02-02 NOTE — Telephone Encounter (Signed)
Pt informed that form has been faxed and placed up front for pt to pick up.Lamonte SakaiZimmerman Rumple, [name], [place]MA

## 2016-02-03 ENCOUNTER — Ambulatory Visit: Payer: Medicaid Other | Attending: Family Medicine | Admitting: Physical Therapy

## 2016-02-17 ENCOUNTER — Encounter: Payer: Self-pay | Admitting: Family Medicine

## 2016-02-17 ENCOUNTER — Ambulatory Visit (INDEPENDENT_AMBULATORY_CARE_PROVIDER_SITE_OTHER): Payer: Medicaid Other | Admitting: Family Medicine

## 2016-02-17 VITALS — BP 134/82 | HR 93 | Temp 98.0°F | Wt 293.0 lb

## 2016-02-17 DIAGNOSIS — E119 Type 2 diabetes mellitus without complications: Secondary | ICD-10-CM | POA: Diagnosis present

## 2016-02-17 DIAGNOSIS — R42 Dizziness and giddiness: Secondary | ICD-10-CM | POA: Diagnosis not present

## 2016-02-17 LAB — POCT GLYCOSYLATED HEMOGLOBIN (HGB A1C): Hemoglobin A1C: 5.4

## 2016-02-17 NOTE — Patient Instructions (Addendum)
Thank you so much for coming to visit us today! - You may consider stopping your Metformin. Continue to check your blood sugars and if they are consistently above 110-120 consider restarting. - Follow up in 6months for A1C check - Contact vestibular physical therapy and set up an appointment. [phone]  Thanks again! Dr. Caroleen Hammanumley

## 2016-02-19 MED ORDER — METFORMIN HCL 1000 MG PO TABS: 1000.0000 mg | ORAL_TABLET | Freq: Every day | ORAL | Status: DC

## 2016-02-19 NOTE — Assessment & Plan Note (Signed)
-   A1C 5.4 today - Discussed discontinuation of Metformin. Unsure if she wants to discontinue or not. Discussed that she may have trial off of Metformin if she wishes. To check blood sugar daily and restart if CBGs trending upward. To call office and let us know she is discontinuing/restarting/etc. - Continue to work on diet and exercise - Follow up in 6months

## 2016-02-19 NOTE — Assessment & Plan Note (Signed)
-   Encouraged Vestibular Rehab. Contact information given so she can call concerning appointment.

## 2016-02-19 NOTE — Progress Notes (Signed)
Subjective:     Patient ID: Monica Huang, female   DOB: August 18, 1981, 35 y.o.   MRN: [id number]  HPI Mrs. Monica Glassmanhelps is a 35yo female presenting today for follow up of diabetes. - Last A1C 6.1 - Reports average blood sugars 80s-90s, highest is in 110s but rarely happens - Checks her blood sugar daily - Has self-decreased Metformin to 1000mg  daily - Has seen ophthalmology in last year. Note history of blindness. - Checks her blood sugar daily - States dizziness similar to last visit. Has not been able to make it to vestibular rehab.  - Denies numbness, chest pain, shortness of breath  Review of Systems Per HPI. Other systems negative.    Objective:   Physical Exam  Constitutional: She appears well-developed and well-nourished. No distress.  HENT:  Head: Normocephalic and atraumatic.  Mouth/Throat: No oropharyngeal exudate.  Cardiovascular: Normal rate.  Exam reveals no gallop and no friction rub.   No murmur heard. Pedal pulses palpable  Pulmonary/Chest: Effort normal. No respiratory distress. She has no wheezes.  Abdominal: Soft. She exhibits no distension. There is no tenderness.  Musculoskeletal: She exhibits no edema.  Neurological:  Monofilament testing revealed sensation intact along feet bilaterally  Skin:  No ulcers noted on feet  Psychiatric: She has a normal mood and affect. Her behavior is normal.       Assessment and Plan:     Diabetes type 2, controlled - A1C 5.4 today - Discussed discontinuation of Metformin. Unsure if she wants to discontinue or not. Discussed that she may have trial off of Metformin if she wishes. To check blood sugar daily and restart if CBGs trending upward. To call office and let us know she is discontinuing/restarting/etc. - Continue to work on diet and exercise - Follow up in 6months  Dizziness  - Encouraged Vestibular Rehab. Contact information given so she can call concerning appointment.

## 2016-03-13 ENCOUNTER — Telehealth: Payer: Self-pay | Admitting: Family Medicine

## 2016-03-13 NOTE — Telephone Encounter (Signed)
Will need to schedule appointment to be seen and evaluated.

## 2016-03-13 NOTE — Telephone Encounter (Signed)
Patient called to ask provider to send in a prescription for depression.

## 2016-03-15 NOTE — Telephone Encounter (Signed)
LM for pt to call the office and make an appt to be seen. Monica Huang, CMA

## 2016-03-16 ENCOUNTER — Encounter: Payer: Self-pay | Admitting: Family Medicine

## 2016-03-16 ENCOUNTER — Ambulatory Visit (INDEPENDENT_AMBULATORY_CARE_PROVIDER_SITE_OTHER): Payer: Medicaid Other | Admitting: Family Medicine

## 2016-03-16 VITALS — BP 126/74 | HR 75 | Temp 98.2°F | Ht 69.0 in | Wt 293.7 lb

## 2016-03-16 DIAGNOSIS — F329 Major depressive disorder, single episode, unspecified: Secondary | ICD-10-CM | POA: Insufficient documentation

## 2016-03-16 DIAGNOSIS — F321 Major depressive disorder, single episode, moderate: Secondary | ICD-10-CM

## 2016-03-16 MED ORDER — CITALOPRAM HYDROBROMIDE 20 MG PO TABS: 20.0000 mg | ORAL_TABLET | Freq: Every day | ORAL | Status: DC

## 2016-03-16 NOTE — Assessment & Plan Note (Signed)
-   Start SSRI: counseled about SEs, expected course and duration of therapy. - Follow up in 2 weeks with PCP to monitor safety, efficacy and tolerability.  - Pt declines counselor/psychotherapy at this time

## 2016-03-16 NOTE — Patient Instructions (Signed)
Taking the medicine as directed and not missing any doses is one of the best things you can do to treat your depression.  Here are some things to keep in mind:  1) Side effects (stomach upset, some increased anxiety) may happen before you notice a benefit.  These side effects typically go away over time. 2) Changes to your dose of medicine or a change in medication all together is sometimes necessary 3) Most people need to be on medication at least 6-12 months 4) Many people will notice an improvement within two weeks but the full effect of the medication can take up to 4-6 weeks 5) Stopping the medication when you start feeling better often results in a return of symptoms 6) If you start having thoughts of hurting yourself or others after starting this medicine, please call me at [phone] immediately.   

## 2016-03-16 NOTE — Progress Notes (Signed)
Subjective: Monica Huang is a 35 y.o. female presenting for depression.   She reports feeling "down," "don't want to do anything" for the past 3 days, along with decreased appetite. She's missed work this week due to this, which is unusual as she normally likes going to work at [employer] because her coworkers are fun to be around and also blind "like me". She formerly enjoyed hanging out with her sisters in the past but no longer sees enjoyment in this. These symptoms are beginning in the setting of her boyfriend (since 2006) distancing himself. She suspects infidelity.   She's having "crazy thoughts." She has intermittent thoughts of harming herself but reports she'd never follow up with this, has no plan or past self-injurious behavior. These thoughts started when she lost her eyesight in late 2005 (complication of pseudotumor cerebri). She denies current suicidal or homicidal ideation.   She denies a history of specific traumatic event. Denies a history of substance abuse. Uses marijuana rarely. Pt reports no diagnoses, hospitalizations or other treatment for depression. Older sisters on depression pills.   Sleep disturbances Too much  Interest loss   Guilt   Energy Unchanged  Cognition/Concentration Problems "All the time," not new  Appetite Change Decreased  Psychomotor Agitation None  Suicide Denies current ideation.   Review of Systems: As above - 1/2ppd smoker, does not desire to quit at this time.  Objective:  BP 126/74 mmHg  Pulse 75  Temp(Src) 98.2 F (36.8 C) (Oral)  Ht 5\' 9"  (1.753 m)  Wt 293 lb 11.2 oz (133.221 kg)  BMI 43.35 kg/m2  LMP 02/18/2016  Gen: Obese, well-appearing 35 y.o. female in NAD Neuro: Alert and oriented, blind with lateral strabismus.  Psych: She is neatly groomed and appropriately dressed. She's cooperative and attentive with normal tone, rate and rhythm of speech. Mood is depressed with broad affect. Thought process is logical and goal  directed. Does not appear to be responding to any internal stimuli. Able to maintain train of thought and concentrate on the questions.  PHQ-9: 17; Somewhat difficult MDQ: Negative  Wt Readings from Last 3 Encounters:  03/16/16 293 lb 11.2 oz (133.221 kg)  02/17/16 293 lb (132.904 kg)  01/28/16 296 lb (134.265 kg)   Lab Results  Component Value Date   TSH 2.314 05/10/2015   Assessment & Plan:     Monica MiresCrystal Huang is a 35 y.o. female here for depression.    Major depressive disorder, single episode - Start SSRI: counseled about SEs, expected course and duration of therapy. - Follow up in 2 weeks with PCP to monitor safety, efficacy and tolerability.  - Pt declines counselor/psychotherapy at this time

## 2016-03-29 NOTE — Telephone Encounter (Signed)
Pt had an appointment for this on 03/16/2016. Lamonte SakaiZimmerman Rumple, Micaiah Remillard D, [place]

## 2016-04-25 ENCOUNTER — Ambulatory Visit: Payer: Medicaid Other | Admitting: Family Medicine

## 2016-05-18 ENCOUNTER — Encounter: Payer: Self-pay | Admitting: Family Medicine

## 2016-05-18 ENCOUNTER — Ambulatory Visit (INDEPENDENT_AMBULATORY_CARE_PROVIDER_SITE_OTHER): Payer: Medicaid Other | Admitting: Family Medicine

## 2016-05-18 VITALS — BP 135/89 | HR 113 | Temp 98.1°F | Wt 278.0 lb

## 2016-05-18 DIAGNOSIS — H918X1 Other specified hearing loss, right ear: Secondary | ICD-10-CM

## 2016-05-18 DIAGNOSIS — H6121 Impacted cerumen, right ear: Secondary | ICD-10-CM

## 2016-05-18 NOTE — Progress Notes (Signed)
   Subjective:    Patient ID: Monica Huang, female    DOB: Nov 08, 1981, 35 y.o.   MRN: [id number]  HPI  Patient presents for Same Day Appointment  CC: right ear full  # Right ear concern:  Woke up this morning and felt like she was not able to hear, like a cup was over her ear.   Denies any pain  Has had wax issues in the past  She is right handed and does use qtips in the ear canal ROS: no runny nose, sore throat, cough  Social Hx: current smoker  Review of Systems   See HPI for ROS.   Past medical history, surgical, family, and social history reviewed and updated in the EMR as appropriate.  Objective:  BP 135/89 mmHg  Pulse 113  Temp(Src) 98.1 F (36.7 C) (Oral)  Wt 278 lb (126.1 kg)  LMP 05/08/2016 (Approximate) Vitals and nursing note reviewed  General: no apparent distress  Ears: right ear canal is completely occluded with dried ear wax. Left ear canal is clear and TM is normal.   Right ear impaction was removed with irrigation  Assessment & Plan:  1. Hearing loss of right ear due to cerumen impaction Cleared in clinic with resolution of symptom/return of hearing. Counseled on proper ear hygiene, avoid qtips inside the ear canal. Follow up as needed.   Return if symptoms worsen or fail to improve.

## 2016-05-18 NOTE — Patient Instructions (Signed)
The right ear canal was full of wax.  DO NOT USE THE QTIPS INSIDE THE EAR CANAL! You are pushing the wax further into the ear canal where it cannot drain naturally.   If you irrigate/wash your ear with the stream from a shower head when you take a shower for 10-20 seconds per ear, this is usually enough to help wash out the wash so the buildup doesn't happen. You can use the QTIP only on the outside of the ear.

## 2016-06-09 ENCOUNTER — Ambulatory Visit (INDEPENDENT_AMBULATORY_CARE_PROVIDER_SITE_OTHER): Payer: Medicaid Other | Admitting: Family Medicine

## 2016-06-09 ENCOUNTER — Encounter: Payer: Self-pay | Admitting: Family Medicine

## 2016-06-09 VITALS — BP 135/91 | HR 87 | Temp 98.1°F | Ht 69.0 in | Wt 282.8 lb

## 2016-06-09 DIAGNOSIS — E119 Type 2 diabetes mellitus without complications: Secondary | ICD-10-CM

## 2016-06-09 DIAGNOSIS — F329 Major depressive disorder, single episode, unspecified: Secondary | ICD-10-CM

## 2016-06-09 DIAGNOSIS — F32A Depression, unspecified: Secondary | ICD-10-CM

## 2016-06-09 DIAGNOSIS — F418 Other specified anxiety disorders: Secondary | ICD-10-CM | POA: Diagnosis not present

## 2016-06-09 DIAGNOSIS — F419 Anxiety disorder, unspecified: Secondary | ICD-10-CM

## 2016-06-09 LAB — POCT GLYCOSYLATED HEMOGLOBIN (HGB A1C): HEMOGLOBIN A1C: 5.6

## 2016-06-09 MED ORDER — BUPROPION HCL ER (XL) 150 MG PO TB24: 150.0000 mg | ORAL_TABLET | Freq: Every day | ORAL | Status: DC

## 2016-06-09 NOTE — Progress Notes (Signed)
Subjective:     Patient ID: Monica Huang, female   DOB: December 08, 1980, 35 y.o.   MRN: [id number]  HPI Monica Huang is a 35yo female presenting today for worsening depression and anxiety. - Notes worsening of symptoms, including restlessness, decreased sleep, decreased appetite, decreased energy, anhedonia - Does report she has occasional thoughts to hurt herself, but does not have a plan and states she could never do it. Denies these thoughts today. - Was started on Celexa in 02/2016. She self discontinued this one month ago because she didn't think it was helping. Also reports shaking after she took it. - Sister also present in room. Reports [name] is not herself and has seemed more down and depressed.  - Amenable to meeting with [facility] - Does not exercise  # Diabetes: - Requests A1C check - Has not been eating as well since - Stopped Metformin  - Smoker  Review of Systems Per HPI. Other systems negative.    Objective:   Physical Exam  Constitutional: She appears well-developed and well-nourished. No distress.  Cardiovascular: Normal rate and regular rhythm.   No murmur heard. Pedal pulses palpable  Pulmonary/Chest: Effort normal. No respiratory distress. She has no wheezes.  Musculoskeletal: She exhibits no edema.  Neurological:  Sensation intact on feet bilaterally  Skin:  No ulcers noted on feet bilaterally  Psychiatric:  Flat affect      Assessment and Plan:     Anxiety and depression - PHQ 9 Score of 23, up from 17 on 4/20 - GAD7 Score of 21(Nearly Every Day for Each) - Bupropion initiated. Titrate up at next visit. Hopeful that it will help with tobacco cessation as well. - Agreeable to [facility]. Dr. Pascal LuxKane to discuss what this program can offer. Appointment scheduled with Monica Huang scheduled 7/18 at 10am. - Follow up in 3-4 weeks.  Diabetes type 2, controlled - Check A1C today

## 2016-06-09 NOTE — Patient Instructions (Signed)
Thank you so much for coming to visit today! I am so sorry you are feeling so poorly. Please start taking Bupropion once a day. Please return in 3-4 weeks and we can discuss increasing the dose if needed. Please let us know if you have any adverse effects.  You are scheduled to see [name] in integrative care on Tuesday 7/18 at 10am. Please call [phone] if needed. Please call if you need to change the time of the appointment.   We will check your A1C today. You will be contacted with the results.  Dr. Caroleen Hammanumley

## 2016-06-09 NOTE — Progress Notes (Signed)
Reason for consultation:  Brief introduction to integrated care.  Patient had to leave today but reports she is interested in seeing a Visual merchandiserBehavioral Health Consultant in the future.    Issues discussed:  Initiating a new medication for depression and anxiety.  May need to get a better handle on the anxiety in Integrated [facility].  Was prescribed Celexa in April.  Said it made her shake and she stopped taking it.  Did not follow up at the time despite feeling worse.    Identified goals:  Scheduled her an appointment for this coming Tuesday for Integrated [facility].  She says she will start taking this medicine.  Asked her to call if she has difficulty tolerating.  Provided information about IC in AVS.

## 2016-06-10 DIAGNOSIS — F419 Anxiety disorder, unspecified: Secondary | ICD-10-CM

## 2016-06-10 DIAGNOSIS — F329 Major depressive disorder, single episode, unspecified: Secondary | ICD-10-CM | POA: Insufficient documentation

## 2016-06-10 DIAGNOSIS — F32A Depression, unspecified: Secondary | ICD-10-CM | POA: Insufficient documentation

## 2016-06-10 NOTE — Assessment & Plan Note (Signed)
Check A1C today

## 2016-06-10 NOTE — Assessment & Plan Note (Addendum)
-   PHQ 9 Score of 23, up from 17 on 4/20 - GAD7 Score of 21(Nearly Every Day for Each) - Bupropion initiated. Titrate up at next visit. Hopeful that it will help with tobacco cessation as well. - Agreeable to [facility]. Dr. Pascal LuxKane to discuss what this program can offer. Appointment scheduled with [name] scheduled 7/18 at 10am. - Follow up in 3-4 weeks.

## 2016-06-13 ENCOUNTER — Ambulatory Visit: Payer: Medicaid Other

## 2016-06-13 ENCOUNTER — Telehealth: Payer: Self-pay

## 2016-06-13 NOTE — Telephone Encounter (Signed)
Called patient after missed appointment with behavioral health today. Patient stated that she meant to call to reschedule for Thursday. 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 I told patient that I would have someone call her to schedule an appointment for Thursday.

## 2016-06-13 NOTE — Telephone Encounter (Signed)
Called patient to schedule appt with behavioral health on Thursday. Patient would like to come in at [facility]9am on Thursday 7/20.

## 2016-06-15 ENCOUNTER — Ambulatory Visit: Payer: Medicaid Other

## 2016-06-15 NOTE — Telephone Encounter (Signed)
Patient did not show for her appointment today.  Will await follow-up from her for further scheduling of IC services.  Also - would recommend using motivational interviewing strategies to get a sense of importance and confidence in treating her mental health issues.

## 2016-07-05 ENCOUNTER — Ambulatory Visit (INDEPENDENT_AMBULATORY_CARE_PROVIDER_SITE_OTHER): Payer: Medicaid Other | Admitting: Student

## 2016-07-05 ENCOUNTER — Encounter: Payer: Self-pay | Admitting: Student

## 2016-07-05 ENCOUNTER — Other Ambulatory Visit (HOSPITAL_COMMUNITY)
Admission: RE | Admit: 2016-07-05 | Discharge: 2016-07-05 | Disposition: A | Discharge status: Home / Self Care | Payer: Medicaid Other | Source: Ambulatory Visit | Attending: Family Medicine | Admitting: Family Medicine

## 2016-07-05 VITALS — BP 137/76 | HR 55 | Temp 98.5°F | Ht 69.0 in | Wt 279.8 lb

## 2016-07-05 DIAGNOSIS — A599 Trichomoniasis, unspecified: Secondary | ICD-10-CM

## 2016-07-05 DIAGNOSIS — Z113 Encounter for screening for infections with a predominantly sexual mode of transmission: Secondary | ICD-10-CM | POA: Insufficient documentation

## 2016-07-05 DIAGNOSIS — N898 Other specified noninflammatory disorders of vagina: Secondary | ICD-10-CM | POA: Diagnosis present

## 2016-07-05 LAB — POCT WET PREP (WET MOUNT): CLUE CELLS WET PREP WHIFF POC: NEGATIVE

## 2016-07-05 NOTE — Progress Notes (Signed)
   Subjective:    Patient ID: Monica Huang is a 35 y.o. old female.  CC: Vaginal discharge  HPI #Vaginal discharge: This has been going on for 2 days. Reports having similar vaginal discharge in the past. She thinks her vaginal discharge is due to her diabetes. She says she was taken off any diabetic medication because it A1c is good. She reports having similar vaginal discharge in the past. She denies vulvar itching. She denies vaginal bleeding, dysuria, fever or chills or frequent urination.   Patient is sexually active with her partner.   PMH: reviewed  Review of Systems Per HPI Objective:   Vitals:   07/05/16 1639  BP: 137/76  Pulse: (!) 55  Temp: 98.5 F (36.9 C)  TempSrc: Oral  Weight: 126.9 kg (279 lb 12.8 oz)  Height: 5\' 9"  (1.753 m)    GEN: appears well, no apparent distress. HEENT:  CVS:  no edema RESP: no increased work of breathing GI: soft, non-tender,non-distended, +BS GU: No perineal no vulvar lesion. No apparent discharge or vaginal bleeding.  Speculum exam: Vaginal walls and cervix visualized and appeared normal. No abnormal-looking discharge or vaginal bleeding. No apparent vaginal wall or cervical lesion PSYCH: appropriate mood and affect     Assessment & Plan:  Vaginal discharge Wet prep positive for trichomoniasis. GC/CT negative. Called and discussed the result an treatment with patient. Advised her that her partner also need to be treated even if he doesn't have symptoms as males are usually asymptomatic. Sent prescription for flagyl 2 gm stat to her pharmacy.

## 2016-07-05 NOTE — Assessment & Plan Note (Addendum)
Wet prep positive for trichomoniasis. GC/CT negative. Called and discussed the result an treatment with patient. Advised her that her partner also need to be treated even if he doesn't have symptoms as males are usually asymptomatic. Sent prescription for flagyl 2 gm stat to her pharmacy.

## 2016-07-05 NOTE — Patient Instructions (Signed)
It was great seeing you today! We have addressed the following issues today  1. Vaginal discharge: we at testing you for infection. If the result comes back positive I'll give you a call and send the prescription to your pharmacy    If we did any lab work today, and the results require attention, either me or my nurse will get in touch with you. If everything is normal, you will get a letter in mail. If you don't hear from us in two weeks, please give us a call. Otherwise, I look forward to talking with you again at our next visit. If you have any questions or concerns before then, please call the clinic at [phone].  Please bring all your medications to every doctors visit   Sign up for My Chart to have easy access to your labs results, and communication with your Primary care physician.    Please check-out at the front desk before leaving the clinic.   Take Care,

## 2016-07-07 ENCOUNTER — Telehealth: Payer: Self-pay | Admitting: Family Medicine

## 2016-07-07 LAB — CERVICOVAGINAL ANCILLARY ONLY
CHLAMYDIA, DNA PROBE: NEGATIVE
NEISSERIA GONORRHEA: NEGATIVE

## 2016-07-07 MED ORDER — METRONIDAZOLE 500 MG PO TABS: 2000.0000 mg | ORAL_TABLET | Freq: Once | ORAL | 0 refills | Status: AC

## 2016-07-07 NOTE — Telephone Encounter (Signed)
Would like results from Aug 9 lab work

## 2016-07-07 NOTE — Telephone Encounter (Signed)
Sending to PCP as well as doctor who saw pt and ordered labs. Lamonte SakaiZimmerman Rumple, [name], [place]

## 2016-07-24 ENCOUNTER — Ambulatory Visit: Payer: Medicaid Other | Admitting: Student

## 2016-07-25 ENCOUNTER — Ambulatory Visit (INDEPENDENT_AMBULATORY_CARE_PROVIDER_SITE_OTHER): Payer: Medicaid Other | Admitting: Family Medicine

## 2016-07-25 ENCOUNTER — Encounter: Payer: Self-pay | Admitting: Family Medicine

## 2016-07-25 VITALS — BP 129/84 | HR 90 | Wt 280.0 lb

## 2016-07-25 DIAGNOSIS — R1030 Lower abdominal pain, unspecified: Secondary | ICD-10-CM

## 2016-07-25 DIAGNOSIS — N760 Acute vaginitis: Secondary | ICD-10-CM

## 2016-07-25 LAB — POCT URINE PREGNANCY: Preg Test, Ur: NEGATIVE

## 2016-07-25 LAB — POCT WET PREP (WET MOUNT)
CLUE CELLS WET PREP WHIFF POC: POSITIVE
Trichomonas Wet Prep HPF POC: ABSENT

## 2016-07-25 LAB — POCT UA - MICROSCOPIC ONLY

## 2016-07-25 LAB — POCT URINALYSIS DIPSTICK
Glucose, UA: NEGATIVE
LEUKOCYTES UA: NEGATIVE
NITRITE UA: NEGATIVE
PH UA: 6.5
PROTEIN UA: NEGATIVE
Spec Grav, UA: 1.02
Urobilinogen, UA: 1

## 2016-07-25 MED ORDER — METRONIDAZOLE 500 MG PO TABS: 500.0000 mg | ORAL_TABLET | Freq: Two times a day (BID) | ORAL | 0 refills | Status: AC

## 2016-07-25 NOTE — Patient Instructions (Signed)
Vaginitis Vaginitis is an inflammation of the vagina. It can happen when the normal bacteria and yeast in the vagina grow too much. There are different types. Treatment will depend on the type you have. HOME CARE  Take all medicines as told by your doctor.  Keep your vagina area clean and dry. Avoid soap. Rinse the area with water.  Avoid washing and cleaning out the vagina (douching).  Do not use tampons or have sex (intercourse) until your treatment is done.  Wipe from front to back after going to the restroom.  Wear cotton underwear.  Avoid wearing underwear while you sleep until your vaginitis is gone.  Avoid tight pants. Avoid underwear or nylons without a cotton panel.  Take off wet clothing (such as a bathing suit) as soon as you can.  Use mild, unscented products. Avoid fabric softeners and scented:  Feminine sprays.  Laundry detergents.  Tampons.  Soaps or bubble baths.  Practice safe sex and use condoms. GET HELP RIGHT AWAY IF:   You have belly (abdominal) pain.  You have a fever or lasting symptoms for more than 2-3 days.  You have a fever and your symptoms suddenly get worse. MAKE SURE YOU:   Understand these instructions.  Will watch this condition.  Will get help right away if you are not doing well or get worse.   This information is not intended to replace advice given to you by your health care provider. Make sure you discuss any questions you have with your health care provider.   Document Released: 02/09/2009 Document Revised: 08/07/2012 Document Reviewed: 04/25/2012 Elsevier Interactive Patient Education 2016 Elsevier Inc.  

## 2016-07-25 NOTE — Progress Notes (Signed)
Subjective:     Patient ID: Monica Huang, female   DOB: 1981-10-30, 35 y.o.   MRN: [id number]  Abdominal Pain  This is a new problem. The current episode started in the past 7 days (Pain is suprapubic on and off for 1 wk. Currently asymptomatic). The onset quality is undetermined. The problem occurs intermittently. The most recent episode lasted 7 days. The problem has been waxing and waning. The pain is located in the suprapubic region. The pain is at a severity of 0/10 (But 7/10 whenever she has pain). The pain is moderate. The quality of the pain is dull. The abdominal pain does not radiate. Pertinent negatives include no anorexia, constipation, diarrhea, dysuria, fever, frequency, hematochezia, melena, nausea or vomiting. Nothing aggravates the pain. The pain is relieved by nothing. Treatments tried: Recently treated for Trichomonas. She wants to make sure this is not what is causing her pain. There is no history of abdominal surgery. Recent trichomonas. She completed treatment. LMP 07/16/16  Vaginal Discharge  The patient's primary symptoms include vaginal discharge. This is a recurrent (Recently completed treatment for trichomonas) problem. Associated symptoms include abdominal pain. Pertinent negatives include no anorexia, constipation, diarrhea, dysuria, fever, frequency, nausea or vomiting. Vaginal discharge characteristics: She is legally blind and can not tell the color of her urine. Vaginal bleeding amount: Does not think she has bleeding. Nothing aggravates the symptoms. Treatments tried: S/P treatment for trichomonas. The treatment provided mild relief. She is sexually active (Not been sexually active with her partner since she got treatment for trichomonas. She is uncertain if her partner got treated or tested for STD). It is unknown whether or not her partner has an STD. She uses nothing for contraception. Her menstrual history has been regular. There is no history of an abdominal surgery or a  Cesarean section. (Recent trichomonas. She completed treatment. LMP 07/16/16)     Review of Systems  Constitutional: Negative for fever.  Gastrointestinal: Positive for abdominal pain. Negative for anorexia, constipation, diarrhea, hematochezia, melena, nausea and vomiting.  Genitourinary: Positive for vaginal discharge. Negative for dysuria and frequency.  All other systems reviewed and are negative.      Objective:   Physical Exam  Constitutional: She appears well-developed. No distress.  Cardiovascular: Normal rate, regular rhythm and normal heart sounds.   No murmur heard. Pulmonary/Chest: Effort normal and breath sounds normal. No respiratory distress. She has no wheezes.  Abdominal: Soft. Bowel sounds are normal. She exhibits no distension and no mass. There is no tenderness.  Nursing note and vitals reviewed.      Urinalysis    Component Value Date/Time   BILIRUBINUR small 07/25/2016 0948   PROTEINUR neg 07/25/2016 0948   UROBILINOGEN 1.0 07/25/2016 0948   NITRITE neg 07/25/2016 0948   LEUKOCYTESUR Negative 07/25/2016 0948     Assessment:     Suprapubic pain Vaginitis    Plan:     1. Abdominal exam benign today.     Upreg neg. UA not suggestive of infection.     She has Ketone in urine which might be suggestive of dehydration.     Patient called and advised to keep self well hydrated and follow up with PCP for recheck.    Tylenol as needed for pain.     If this persist to obtain U/S.  2. Vaginitis: Wet prep positive for BV.     Script sent to pharm for treatment.     Patient is aware.

## 2016-09-17 NOTE — Progress Notes (Deleted)
Subjective:     Patient ID: Monica Huang, female   DOB: 1981-08-13, 35 y.o.   MRN: [id number]  HPI Monica Huang is a 35yo female presenting today for abdominal pain and irregular periods. - Last seen in 06/2016 for abdominal pain. Reported suprapubic pain. Also noted vaginal discharge at this visit. Benign abdominal exam noted with plan if pain did not improve to obtain US.  Recently completed treated for BV and Trichomonas.  - Last pap smear 02/2015 with negative cytology, negative HPV.   Review of Systems     Objective:   Physical Exam     Assessment:     ***    Plan:     ***

## 2016-09-18 ENCOUNTER — Ambulatory Visit: Payer: Medicaid Other | Admitting: Family Medicine

## 2016-09-25 ENCOUNTER — Encounter: Payer: Self-pay | Admitting: Internal Medicine

## 2016-09-25 ENCOUNTER — Ambulatory Visit (INDEPENDENT_AMBULATORY_CARE_PROVIDER_SITE_OTHER): Payer: Medicaid Other | Admitting: Internal Medicine

## 2016-09-25 VITALS — BP 134/92 | HR 68 | Temp 98.2°F | Ht 69.0 in | Wt 278.4 lb

## 2016-09-25 DIAGNOSIS — R109 Unspecified abdominal pain: Secondary | ICD-10-CM

## 2016-09-25 DIAGNOSIS — Z7251 High risk heterosexual behavior: Secondary | ICD-10-CM | POA: Diagnosis present

## 2016-09-25 LAB — POCT URINE PREGNANCY: Preg Test, Ur: NEGATIVE

## 2016-09-25 NOTE — Patient Instructions (Addendum)
It was nice meeting you today Ms. Monica Glassmanhelps!  For your cramps, you can take Tylenol or ibuprofen as written on the bottle.   If your pain begins to worsen and is not relieved by these medications, please call the office or go to the emergency room.   If you have any questions or concerns, please feel free to call the clinic.   Be well,  Dr. Natale MilchLancaster

## 2016-09-25 NOTE — Assessment & Plan Note (Signed)
Most consistent with menstrual cramping, as began when patient's period started today. Less concern for pregnancy as patient reports three periods this month, however will obtain pregnancy test, as patient with recent unprotected sexual intercourse. Discussed with patient that if she is not on birth control and is not using condoms, she is at risk of getting pregnancy and contracting STDs. No tenderness or abnormalities on abdominal exam.  - Ibuprofen or Tylenol for pain - Will f/u on urine pregnancy (lab closed for the day but will follow up first thing tomorrow morning and alert patient if positive)

## 2016-09-25 NOTE — Progress Notes (Signed)
   Subjective:    Patient ID: Monica Huang, female    DOB: [birth year], 35 y.o.   MRN: [id number]  HPI  Patient presents for same day appointment for abdominal cramping.  Abdominal cramps Began this morning after her period started. Describes pain as cramping and more irritating than painful. Has been intermittent throughout the day. Patient normally does not experience cramping with periods. Denies vomiting, endorses some nausea earlier this morning. Denies abdominal pain, diarrhea. Denies dysuria, hematuria, increased urinary frequency. Denies vaginal discharge. Denies fevers, chills. Patient is sexually active. She is not on birth control and does not use condoms. Last intercourse yesterday. Patient is not concerned about STD exposure. Patient reports that this is her third period this month. Says that the first two lasted for only three days.  Patient has not tried anything to help with her symptoms. Cramps are generalized, not in one particular location.   Smoking history reviewed. Of note, patient is legally blind.  Review of Systems See HPI.     Objective:   Physical Exam  Constitutional: She is oriented to person, place, and time.  Overweight female in NAD  HENT:  Head: Normocephalic and atraumatic.  Pulmonary/Chest: Effort normal. No respiratory distress.  Abdominal: Soft. Bowel sounds are normal. She exhibits no distension and no mass. There is no tenderness. There is no rebound and no guarding.  Obese  Neurological: She is alert and oriented to person, place, and time.  Psychiatric: She has a normal mood and affect. Her behavior is normal.      Assessment & Plan:  Abdominal cramps Most consistent with menstrual cramping, as began when patient's period started today. Less concern for pregnancy as patient reports three periods this month, however will obtain pregnancy test, as patient with recent unprotected sexual intercourse. Discussed with patient that if she is not on  birth control and is not using condoms, she is at risk of getting pregnancy and contracting STDs. No tenderness or abnormalities on abdominal exam.  - Ibuprofen or Tylenol for pain - Will f/u on urine pregnancy (lab closed for the day but will follow up first thing tomorrow morning and alert patient if positive)   Tarri AbernethyAbigail J Lancaster, MD, MPH PGY-2 [facility] Pager [phone]

## 2016-10-13 ENCOUNTER — Encounter: Payer: Self-pay | Admitting: Internal Medicine

## 2016-10-13 ENCOUNTER — Ambulatory Visit (INDEPENDENT_AMBULATORY_CARE_PROVIDER_SITE_OTHER): Payer: Medicaid Other | Admitting: Internal Medicine

## 2016-10-13 DIAGNOSIS — L732 Hidradenitis suppurativa: Secondary | ICD-10-CM | POA: Diagnosis not present

## 2016-10-13 MED ORDER — SULFAMETHOXAZOLE-TRIMETHOPRIM 800-160 MG PO TABS: 1.0000 | ORAL_TABLET | Freq: Two times a day (BID) | ORAL | 0 refills | Status: DC

## 2016-10-13 NOTE — Assessment & Plan Note (Signed)
Long history of recurrent boils, however first episode in over a year. Lesion in R axilla does not appear infected. Unable to examine lesions in thigh, however patient with no reported signs of systemic infection. As patient has responded well to Bactrim in the past, will try 10d course of Bactrim again. Patient instructed to return if no improvement at end of antibiotics course.

## 2016-10-13 NOTE — Progress Notes (Signed)
   Subjective:    Patient ID: Monica Huang, female    DOB: 22-Dec-1980, 35 y.o.   MRN: [id number]  HPI  Patient presents to discuss boils on arms and legs.   Boils First noticed boil in R axilla earlier this week. Was initially painful, however is non-tender now. Has also noted appearance of boils on both inner thighs intermittently for the past month. Says that these are painful, especially when she is putting on pants. Denies drainage from any lesions. Denies fevers, nausea/vomiting. Has not tried anything to improve lesions. Patient has history of hidradenitis. She has been given Bactrim multiple times in the past with resolution of symptoms. Patient says this is her first episode of boils in a while.   Patient is legally blind.   Smoking status reviewed.   Review of Systems See HPI.     Objective:   Physical Exam  Constitutional: She appears well-developed and well-nourished. No distress.  HENT:  Head: Normocephalic and atraumatic.  Eyes:  Legally blind  Pulmonary/Chest: Effort normal. No respiratory distress.  Skin:  Patient politely refusing to change in to gown for examination of lesions on thighs today. Agreeable to having lesion in R axilla examined. ~2cmx1cm erythematous slightly fluctuant lesion present in R axilla. Non-tender to palpation. No drainage. No induration.   Psychiatric: She has a normal mood and affect. Her behavior is normal.      Assessment & Plan:  Hidradenitis Long history of recurrent boils, however first episode in over a year. Lesion in R axilla does not appear infected. Unable to examine lesions in thigh, however patient with no reported signs of systemic infection. As patient has responded well to Bactrim in the past, will try 10d course of Bactrim again. Patient instructed to return if no improvement at end of antibiotics course.   Tarri AbernethyAbigail J Zacharius Funari, MD, MPH PGY-2 [facility] Pager [phone]

## 2016-10-13 NOTE — Patient Instructions (Signed)
It was nice seeing you again today [name]!  Please begin taking Bactrim (antibiotic) twice a day for the next 10 days. If your boils have not improved by then, please schedule another appointment.   If you have any questions or concerns, please feel free to call the clinic.   Be well,  Dr. Natale MilchLancaster

## 2016-11-10 ENCOUNTER — Encounter: Payer: Self-pay | Admitting: Family Medicine

## 2016-11-10 ENCOUNTER — Ambulatory Visit (INDEPENDENT_AMBULATORY_CARE_PROVIDER_SITE_OTHER): Payer: Medicaid Other | Admitting: Family Medicine

## 2016-11-10 VITALS — BP 130/78 | HR 69 | Temp 98.4°F | Ht 69.0 in | Wt 276.8 lb

## 2016-11-10 DIAGNOSIS — E119 Type 2 diabetes mellitus without complications: Secondary | ICD-10-CM

## 2016-11-10 DIAGNOSIS — Z720 Tobacco use: Secondary | ICD-10-CM | POA: Diagnosis not present

## 2016-11-10 DIAGNOSIS — Z2821 Immunization not carried out because of patient refusal: Secondary | ICD-10-CM | POA: Diagnosis not present

## 2016-11-10 LAB — POCT GLYCOSYLATED HEMOGLOBIN (HGB A1C): HEMOGLOBIN A1C: 5.4

## 2016-11-10 NOTE — Progress Notes (Signed)
    Subjective: CC: DM2 HPI: Monica Huang is a 35 y.o. female presenting to clinic today for follow up. Concerns today include:  1. Diabetes:  High at home: 107  Low at home: 2186 Prescribed medications: Metformin 1000mg  qd.  Though has not been taking medication.  She is diet controlled. ROS: denies fever, chills, dizziness, LOC, polyuria, polydipsia, numbness or tingling in extremities or chest pain. Last eye exam: >1 year ago Last foot exam: 01/2016 Last A1c: 5.6 Nephropathy screen indicated?: yes Last flu, zoster and/or pneumovax: declines flu and pneumonia vaccines  Social History Reviewed: active smoker 1/2ppd [place].  She is contemplative.  Notes she cancelled previous appt with Dr Raymondo BandKoval. Rosezena SensorFamHx and MedHx reviewed.  Please see EMR. Health Maintenance: HIV screen, DM eye exam, urine microalbumin Flu Vaccine due: yes Pneumonia Vaccine due: yes, patient w/ DM2 and needs PPSV 23  ROS: Per HPI  Objective: Office vital signs reviewed. BP 130/78   Pulse 69   Temp 98.4 F (36.9 C) (Oral)   Ht 5\' 9"  (1.753 m)   Wt 276 lb 12.8 oz (125.6 kg)   LMP 10/18/2016 (Approximate)   SpO2 98%   BMI 40.88 kg/m   Physical Examination:  General: Awake, alert, obese, No acute distress HEENT: Normal    Eyes: decreased vision, uses cane, sclera white Cardio: regular rate and rhythm, S1S2 heard, no murmurs appreciated Pulm: clear to auscultation bilaterally, no wheezes, rhonchi or rales; normal work of breathing on room air  Results for orders placed or performed in visit on 11/10/16 (from the past 24 hour(s))  HgB A1c     Status: None   Collection Time: 11/10/16  3:16 PM  Result Value Ref Range   Hemoglobin A1C 5.4    Assessment/ Plan: 35 y.o. female   1. Controlled type 2 diabetes mellitus without complication, without long-term current use of insulin (HCC) - Continue low carb diet.  Encouraged exercise - Patient to schedule DM eye exam.  Handout with providers given - HgB  A1c - Microalbumin, urine  2. Tobacco abuse. Contemplative - Smoking cessation recommended - Recommend f/u with Dr Raymondo BandKoval  3. Vaccination refused by patient - PNA and flu vaccines declined - Counseling provided  Follow up with PCP as needed.   Raliegh IpAshly M Gottschalk, DO PGY-3, [facility]

## 2016-11-10 NOTE — Patient Instructions (Addendum)
It was a pleasure seeing you today, Monica Huang.  I'd like you to schedule an appointment for a diabetic eye exam.  You should not need a referral for this, but please call me or Dr Caroleen Hammanumley if you have difficulty scheduling.  I will contact you will the results of your lab.  If anything is abnormal, I will call you.  Otherwise, expect a copy to be mailed to you.  Plan to follow up with Dr Caroleen Hammanumley as needed for routine care.  I recommend that you schedule an appointment with Dr Raymondo BandKoval for smoking cessation.  Please feel free to call our office at [phone] if any questions or concerns arise.  Warm Regards, Ashly M. Nadine CountsGottschalk, DO

## 2016-11-11 LAB — MICROALBUMIN, URINE: MICROALB UR: 0.9 mg/dL

## 2016-12-01 ENCOUNTER — Ambulatory Visit (INDEPENDENT_AMBULATORY_CARE_PROVIDER_SITE_OTHER): Payer: Medicaid Other | Admitting: Internal Medicine

## 2016-12-01 ENCOUNTER — Encounter: Payer: Self-pay | Admitting: Internal Medicine

## 2016-12-01 VITALS — BP 136/82 | HR 83 | Temp 98.0°F | Ht 69.0 in | Wt 276.0 lb

## 2016-12-01 DIAGNOSIS — M545 Low back pain, unspecified: Secondary | ICD-10-CM

## 2016-12-01 DIAGNOSIS — M79604 Pain in right leg: Secondary | ICD-10-CM

## 2016-12-01 MED ORDER — GABAPENTIN 100 MG PO CAPS: 100.0000 mg | ORAL_CAPSULE | Freq: Three times a day (TID) | ORAL | 3 refills | Status: DC

## 2016-12-01 MED ORDER — CYCLOBENZAPRINE HCL 10 MG PO TABS: 10.0000 mg | ORAL_TABLET | Freq: Three times a day (TID) | ORAL | 0 refills | Status: DC | PRN

## 2016-12-01 MED ORDER — MELOXICAM 15 MG PO TABS: 15.0000 mg | ORAL_TABLET | Freq: Every day | ORAL | 0 refills | Status: DC

## 2016-12-01 NOTE — Progress Notes (Signed)
   [facility] Monica CharsAsiyah Iwalani Templeton, MD Phone: [phone]  Reason For Visit: SDA for back and leg pain   # BACK PAIN/Sciatic Pain   Back pain for about two weeks ago. Has had similar back pain about 2 years. Sometimes back pain radiates to right leg and causes numbness.   Lower back pain, intermittent  Patient has tried Ibuprofen, Alieve, Tylenol. Has helped some with pain  Pain radiates down the right leg  History of trauma or injury: No hx of trauma  Prior history of similar pain: yes History of cancer: No Weak immune system:  No  History of IV drug use: No History of steroid use: No  Symptoms Incontinence of bowel or bladder: no Numbness of leg: right - has had this in past  Fever: no Weakness: No Rest or Night pain: indicates having pain that wakes her up at night  Weight Loss:  no Rash: no  Patient believes the pain is from sciatic might be causing their pain.  ROS see HPI Smoking Status - smokes about a half a pack daily   Objective: BP 136/82 (BP Location: Left Arm, Patient Position: Sitting, Cuff Size: Large)   Pulse 83   Temp 98 F (36.7 C) (Oral)   Ht 5\' 9"  (1.753 m)   Wt 276 lb (125.2 kg)   LMP 11/22/2016   SpO2 96%   BMI 40.76 kg/m  Gen: NAD, alert, cooperative with exam MSK:no abnormalities noted on inspection, paraspinal muscles tenderness of the lumbar back on palpation, pain more significant with flexion than extension, negative straight leg test, normal strength L2-S1, no changes in sensation, 1+ reflexes bilaterally Skin: dry, intact, no rashes or lesions   Assessment/Plan: See problem based a/p Low back pain radiating to right leg Sciatic back pain, chronic/intermittent in nature  - Mobic for two weeks, no other NSAIDs  - gabapentin daily for numbness  - flexeril as needed for break through pain  - back exercises/walking daily - Follow up in 6 weeks

## 2016-12-01 NOTE — Patient Instructions (Addendum)
  I want you to take Mobic once daily for two weeks as well as gabapentin 3 times daily. For break through back pain, you can use the flexeril as well. You can follow up in 1 month if no improvement of back pain.  Back Pain, Adult Introduction Back pain is very common. The pain often gets better over time. The cause of back pain is usually not dangerous. Most people can learn to manage their back pain on their own. Follow these instructions at home: Watch your back pain for any changes. The following actions may help to lessen any pain you are feeling:  Stay active. Start with short walks on flat ground if you can. Try to walk farther each day.  Exercise regularly as told by your doctor. Exercise helps your back heal faster. It also helps avoid future injury by keeping your muscles strong and flexible.  Do not sit, drive, or stand in one place for more than 30 minutes.  Do not stay in bed. Resting more than 1-2 days can slow down your recovery.  Be careful when you bend or lift an object. Use good form when lifting:  Bend at your knees.  Keep the object close to your body.  Do not twist.  Sleep on a firm mattress. Lie on your side, and bend your knees. If you lie on your back, put a pillow under your knees.  Take medicines only as told by your doctor.  Put ice on the injured area.  Put ice in a plastic bag.  Place a towel between your skin and the bag.  Leave the ice on for 20 minutes, 2-3 times a day for the first 2-3 days. After that, you can switch between ice and heat packs.  Avoid feeling anxious or stressed. Find good ways to deal with stress, such as exercise.  Maintain a healthy weight. Extra weight puts stress on your back. Contact a doctor if:  You have pain that does not go away with rest or medicine.  You have worsening pain that goes down into your legs or buttocks.  You have pain that does not get better in one week.  You have pain at night.  You lose  weight.  You have a fever or chills. Get help right away if:  You cannot control when you poop (bowel movement) or pee (urinate).  Your arms or legs feel weak.  Your arms or legs lose feeling (numbness).  You feel sick to your stomach (nauseous) or throw up (vomit).  You have belly (abdominal) pain.  You feel like you may pass out (faint). This information is not intended to replace advice given to you by your health care provider. Make sure you discuss any questions you have with your health care provider. Document Released: 05/01/2008 Document Revised: 04/20/2016 Document Reviewed: 03/17/2014  2017 Elsevier

## 2016-12-04 NOTE — Assessment & Plan Note (Addendum)
Sciatic back pain, chronic/intermittent in nature  - Mobic for two weeks, no other NSAIDs  - gabapentin daily for numbness  - flexeril as needed for break through pain  - back exercises/walking daily - Follow up in 6 weeks

## 2017-01-09 ENCOUNTER — Ambulatory Visit (INDEPENDENT_AMBULATORY_CARE_PROVIDER_SITE_OTHER): Payer: Medicaid Other | Admitting: Family Medicine

## 2017-01-09 ENCOUNTER — Encounter: Payer: Self-pay | Admitting: Family Medicine

## 2017-01-09 VITALS — BP 122/85 | HR 80 | Temp 97.6°F | Ht 69.0 in | Wt 274.8 lb

## 2017-01-09 DIAGNOSIS — Z30011 Encounter for initial prescription of contraceptive pills: Secondary | ICD-10-CM

## 2017-01-09 DIAGNOSIS — L732 Hidradenitis suppurativa: Secondary | ICD-10-CM

## 2017-01-09 MED ORDER — DOXYCYCLINE HYCLATE 100 MG PO TABS: 100.0000 mg | ORAL_TABLET | Freq: Two times a day (BID) | ORAL | 0 refills | Status: DC

## 2017-01-09 MED ORDER — VARENICLINE TARTRATE 1 MG PO TABS: ORAL_TABLET | ORAL | 0 refills | Status: DC

## 2017-01-09 MED ORDER — VARENICLINE TARTRATE 0.5 MG PO TABS: ORAL_TABLET | ORAL | 0 refills | Status: DC

## 2017-01-09 MED ORDER — NORGESTIM-ETH ESTRAD TRIPHASIC 0.18/0.215/0.25 MG-35 MCG PO TABS: 1.0000 | ORAL_TABLET | Freq: Every day | ORAL | 5 refills | Status: DC

## 2017-01-09 NOTE — Patient Instructions (Signed)
Thank you so much for coming to visit today! We will treat your Hidradenitis with Doxycycline for 10 days. Please let us know if no improvement. I have also referred you to Dermatology. Please let us know if you do not hear from them soon.  I have sent a prescription for Ortho-Tri-Cyclen to the pharmacist. Please take this around the same time every day. Please note this medication can increase your risk for high blood pressure and blood clots, especially if you continue to smoke. I have also sent a prescription for Chantix to the pharmacy--there should be two prescriptions waiting for you. You should take 0.5mg  once a day for 3 days, 0.5mg  twice a day for 4 days, and then start 1mg  twice a day for 11 more weeks.  Please return in 9-10 weeks to determine if this medications should be tapered down or continued. You may also schedule an appointment with Dr. Raymondo BandKoval if you wish to further discuss tobacco cessation.  Dr. Caroleen Hammanumley

## 2017-01-10 NOTE — Progress Notes (Signed)
Subjective:     Patient ID: Monica Huang, female   DOB: 1981-02-03, 36 y.o.   MRN: [id number]  HPI Monica Huang is a 36yo female presenting today for Hidradenitis suppurativa follow up and to discuss birth control. # Hidradenitis: Long history of Hidradenitis noted with occasional flares. Last flare in 09/2016. Reports she normally receives Bactrim for her flares and this worked well initially, but didn't seem to work as well during the last treatment in 09/2016. Denies any lesions in axilla. States currently has flare over left groin with increased warmth, denies drainage. Denies fever. Current flare started a few weeks ago. Recently completed course of Amoxicillin for dental work, last dose this morning. Reports it has been many years since she was evaluated by Dermatology.  # Contraception: Wishes to initiate oral contraception. Has a friend on Ortho-Tri-Cyclen and she would like a prescription. Has considered other options, including Nexplanon, Depo, and IUD, and does not wish to pursue these methods. Denies history of migraines. Denies personal or family history of blood clots. Denies history of hypertension. Does smoke 8-10 cigarettes per day, but is motivated to quit and would like a prescription for Chantix.  Review of Systems Per HPI    Objective:   Physical Exam  Constitutional: She appears well-developed and well-nourished. No distress.  Cardiovascular: Normal rate and regular rhythm.   No murmur heard. Pulmonary/Chest: Effort normal. No respiratory distress. She has no wheezes.  Abdominal: Soft. She exhibits no distension. There is no tenderness.  Skin:  Significant scarring and tracking along groin bilaterally and under pannus, slightly increased warmth and mild drainage from track noted, no fluctuance.  Psychiatric: She has a normal mood and affect. Her behavior is normal.      Assessment and Plan:     1. Hidradenitis suppurativa [place] Stage 3. Prescription for Doxycycline  given. Referral placed to Dermatology given advanced stage.  2. Encounter for initial prescription of contraceptive pills Different methods discussed. Prescription for Ortho-Tri-Cyclen given. Increased risk of blood clots discussed, especially with smoking. Patient promises to quit cold-turkey. Prescription for Chantix given. Encouraged to follow up with Dr. Raymondo BandKoval for further tobacco cessation counseling. Refills only given through 06/2017--will not be refilled further unless she stops smoking. Follow up in one month for next appointment concerning diabetes management.

## 2017-01-25 ENCOUNTER — Ambulatory Visit (INDEPENDENT_AMBULATORY_CARE_PROVIDER_SITE_OTHER): Payer: Medicaid Other | Admitting: Student

## 2017-01-25 ENCOUNTER — Encounter: Payer: Self-pay | Admitting: Student

## 2017-01-25 ENCOUNTER — Other Ambulatory Visit (HOSPITAL_COMMUNITY)
Admission: RE | Admit: 2017-01-25 | Discharge: 2017-01-25 | Disposition: A | Discharge status: Home / Self Care | Payer: Medicaid Other | Source: Ambulatory Visit | Attending: Family Medicine | Admitting: Family Medicine

## 2017-01-25 VITALS — BP 122/70 | HR 84 | Temp 98.3°F | Ht 69.0 in | Wt 271.0 lb

## 2017-01-25 DIAGNOSIS — Z113 Encounter for screening for infections with a predominantly sexual mode of transmission: Secondary | ICD-10-CM | POA: Insufficient documentation

## 2017-01-25 DIAGNOSIS — N898 Other specified noninflammatory disorders of vagina: Secondary | ICD-10-CM

## 2017-01-25 DIAGNOSIS — L298 Other pruritus: Secondary | ICD-10-CM | POA: Diagnosis not present

## 2017-01-25 DIAGNOSIS — L732 Hidradenitis suppurativa: Secondary | ICD-10-CM

## 2017-01-25 LAB — POCT WET PREP (WET MOUNT)
Clue Cells Wet Prep Whiff POC: POSITIVE
Trichomonas Wet Prep HPF POC: ABSENT

## 2017-01-25 MED ORDER — METRONIDAZOLE 500 MG PO TABS: 500.0000 mg | ORAL_TABLET | Freq: Two times a day (BID) | ORAL | 0 refills | Status: DC

## 2017-01-25 MED ORDER — FLUCONAZOLE 150 MG PO TABS: 150.0000 mg | ORAL_TABLET | Freq: Once | ORAL | 0 refills | Status: AC

## 2017-01-25 NOTE — Assessment & Plan Note (Signed)
Improving after doxycycline course - will follow with dermatology per PCP

## 2017-01-25 NOTE — Patient Instructions (Signed)
Follow up as needed Please take Diflucan for Yeast infection You will be called about any other abnormal results If you have questions or concerns, call the officer at [phone]

## 2017-01-25 NOTE — Progress Notes (Signed)
   Subjective:    Patient ID: Monica Huang, female    DOB: 01-24-1981, 36 y.o.   MRN: [id number]   CC: vaginal itching  HPI: 36 y/o F presents with vaginal itching after starting doxycycline for pelvic hydradenitis  Vaginal itching - no found odor, no vaginal irritation, no dysuria or abdominal pain - she is sexually active with one female partner  Hydradenitis - boils have largely improved after starting the doxycycline on 01/09/2017 - no fevers  Smoking status reviewed 0.5 ppd Review of Systems  Per HPI, else denies, chest pain, shortness of breath, abdominal pain,    Objective:  BP 122/70   Pulse 84   Temp 98.3 F (36.8 C) (Oral)   Ht 5\' 9"  (1.753 m)   Wt 271 lb (122.9 kg)   LMP  (LMP Unknown)   SpO2 100%   BMI 40.02 kg/m  Vitals and nursing note reviewed  General: NAD Cardiac: RRR, Respiratory: CTAB, normal effort Abdomen: obese, soft, nontender,. Skin: warm and dry, no rashes noted Neuro: alert and oriented, no focal deficits   Pelvic: bilateral inguinal creases and inner thighs with scarred lesions and pitting, one draining lesion on right inner thigh, else normal EGBUS, vaginal canal with gross amount of white clumping discharge, normal cervix with no CMT, normal mobile uterus, normal adnexa with no masses, no adnexal tenderness     Assessment & Plan:    Hidradenitis Improving after doxycycline course - will follow with dermatology per PCP  Vaginal discharge Wet prep and GC/CT collected - wet prep + for BV and yeast, will treat both    Alyssa A. Kennon RoundsHaney MD, MS Family Medicine Resident PGY-3 Pager [phone]

## 2017-01-25 NOTE — Assessment & Plan Note (Signed)
Wet prep and GC/CT collected - wet prep + for BV and yeast, will treat both

## 2017-01-29 ENCOUNTER — Telehealth: Payer: Self-pay | Admitting: Student

## 2017-01-29 ENCOUNTER — Ambulatory Visit: Payer: Medicaid Other | Admitting: Family Medicine

## 2017-01-29 LAB — CERVICOVAGINAL ANCILLARY ONLY
Chlamydia: NEGATIVE
NEISSERIA GONORRHEA: NEGATIVE

## 2017-01-29 NOTE — Telephone Encounter (Signed)
Please call the patient and inform her that her GC/Ct tests were negative

## 2017-01-30 NOTE — Telephone Encounter (Signed)
Left message for pt to call back- please inform her her std test are negative.

## 2017-01-30 NOTE — Telephone Encounter (Signed)
Pt was advised. ep °

## 2017-02-06 ENCOUNTER — Telehealth: Payer: Self-pay | Admitting: Family Medicine

## 2017-02-06 NOTE — Telephone Encounter (Signed)
Pt is calling because she is waiting for a referral to see a dermatologist about her skin issues and the medication that she has been prescribed in the past is not working. Please call her with details and also what else she might be able to do in the mean time. jw

## 2017-02-09 ENCOUNTER — Ambulatory Visit: Payer: Medicaid Other | Admitting: Family Medicine

## 2017-02-09 NOTE — Telephone Encounter (Signed)
Referral to Dermatology made 01/09/17. It appears that [facility] was attempting to contact her. She may contact them at [phone].

## 2017-02-12 NOTE — Telephone Encounter (Signed)
LVM for pt to call back to inform her of below.  If pt calls back please give her the information in previous message. Monica Huang, [name], [place]

## 2017-02-19 ENCOUNTER — Encounter (HOSPITAL_COMMUNITY): Payer: Self-pay | Admitting: Emergency Medicine

## 2017-02-19 ENCOUNTER — Other Ambulatory Visit: Payer: Self-pay

## 2017-02-19 ENCOUNTER — Emergency Department (HOSPITAL_COMMUNITY)
Admission: EM | Admit: 2017-02-19 | Discharge: 2017-02-19 | Disposition: A | Discharge status: Home / Self Care | Payer: Medicaid Other | Attending: Emergency Medicine | Admitting: Emergency Medicine

## 2017-02-19 DIAGNOSIS — R200 Anesthesia of skin: Secondary | ICD-10-CM | POA: Diagnosis present

## 2017-02-19 DIAGNOSIS — F17210 Nicotine dependence, cigarettes, uncomplicated: Secondary | ICD-10-CM | POA: Diagnosis not present

## 2017-02-19 DIAGNOSIS — E119 Type 2 diabetes mellitus without complications: Secondary | ICD-10-CM | POA: Diagnosis not present

## 2017-02-19 DIAGNOSIS — R202 Paresthesia of skin: Secondary | ICD-10-CM | POA: Diagnosis not present

## 2017-02-19 DIAGNOSIS — R42 Dizziness and giddiness: Secondary | ICD-10-CM | POA: Diagnosis not present

## 2017-02-19 NOTE — Discharge Instructions (Signed)
Please read attached information. If you experience any new or worsening signs or symptoms please return to the emergency room for evaluation. Please follow-up with your primary care provider or specialist as discussed.  °

## 2017-02-19 NOTE — ED Triage Notes (Signed)
Per pt, pt has been having a fluttering sensation in her chest for 3-4 days.  Today she felt burning in her stomach and chest, along with left hand numbness.  Pt also states she is feeling dizzy. Pt is a&o x 4.

## 2017-02-19 NOTE — ED Provider Notes (Signed)
[facility]-EMERGENCY DEPT Provider Note   CSN: [id number] Arrival date & time: 02/19/17  1124   History   Chief Complaint Chief Complaint  Patient presents with  . Numbness    HPI Monica Huang is a 36 y.o. female.  HPI    36 year old female presents today with complaints of vertigo.  Patient has a significant past medical history of the same, reports she has been suffering from this for several years.  She notes that the symptoms come out of the blue and lasted several minutes and resolve on their own.  She has been seen by cardiology, primary care for this, prescribed meclizine with no improvement in her symptoms.  Patient notes that she was sitting down at her workstation today when she developed decreased sensation in her left hand with associated vertigo.  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  She notes the vertigo felt like previous episodes, she was able to get up from her desk and go to the bathroom.  She notes at that time she had a burning sensation in her abdomen and chest.  She was instructed to follow-up in the emergency room by her employer and she was unable to can any working.  She notes the symptoms dissipated prior to arrival to the emergency room.  Patient also notes that she has had intermittent fluttering over the last several days.  Notes since her arrival to the emergency room she has had no continued symptoms including dizziness, numbness, weakness, chest pain or shortness of breath.  Patient has been eating and drinking with no significant changes.    Past Medical History:  Diagnosis Date  . Chronic back pain   . Chronic leg pain   . Diabetes mellitus without complication (HCC)   . Dizziness   . Hydradenitis   . Hyperlipidemia   . Legally blind   . Macular degeneration   . Pseudotumor cerebri syndrome 2005   shunt placed- and legally blind  . Sciatica     Patient Active Problem List   Diagnosis Date Noted  .  Abdominal cramps 09/25/2016  . Vaginal discharge 07/05/2016  . Anxiety and depression 06/10/2016  . Syncope and collapse 12/29/2015  . Allergic rhinitis 05/24/2015  . Essential hypertension, benign 05/10/2015  . Well female exam with routine gynecological exam 03/10/2015  . Breast pain, right 02/11/2015  . Dizziness  09/30/2014  . Diabetes type 2, controlled (HCC) 02/19/2014  . Tongue lesion 12/02/2013  . Mechanical complication-ventricular(CSF) communicating shunt (HCC) 03/05/2013  . Hidradenitis 10/15/2012  . Right leg numbness 08/02/2012  . Low back pain radiating to right leg 02/13/2012  . Tobacco abuse 01/03/2012  . Legally blind 01/03/2012  . Obesity 01/03/2012  . Healthcare maintenance 01/03/2012  . Marijuana smoker (HCC) 01/03/2012    Past Surgical History:  Procedure Laterality Date  . CSF SHUNT     2 revisions    OB History    No data available       Home Medications    Prior to Admission medications   Medication Sig Start Date End Date Taking? Authorizing Provider  cyclobenzaprine (FLEXERIL) 10 MG tablet Take 1 tablet (10 mg total) by mouth 3 (three) times daily as needed for muscle spasms. Patient taking differently: Take 10 mg by mouth 3 (three) times daily as needed for muscle spasms.  12/01/16  Yes Asiyah Mayra Reel, MD  gabapentin (NEURONTIN) 100 MG capsule Take 1 capsule (100 mg total) by mouth 3 (  three) times daily. 12/01/16  Yes Asiyah Mayra ReelZahra Mikell, MD  Norgestimate-Ethinyl Estradiol Triphasic (ORTHO TRI-CYCLEN, 28,) 0.18/0.215/0.25 MG-35 MCG tablet Take 1 tablet by mouth daily. 01/09/17  Yes [place] N Rumley, DO  doxycycline (VIBRA-TABS) 100 MG tablet Take 1 tablet (100 mg total) by mouth 2 (two) times daily. Patient not taking: Reported on 02/19/2017 01/09/17   Lora Havensaleigh N Rumley, DO  meloxicam (MOBIC) 15 MG tablet Take 1 tablet (15 mg total) by mouth daily. Patient not taking: Reported on 02/19/2017 12/01/16   Asiyah Mayra ReelZahra Mikell, MD  metroNIDAZOLE (FLAGYL)  500 MG tablet Take 1 tablet (500 mg total) by mouth 2 (two) times daily. Patient not taking: Reported on 02/19/2017 01/25/17   Bonney AidAlyssa A Haney, MD  varenicline (CHANTIX) 0.5 MG tablet Please take 1 tablet (0.5mg ) once a day for three days. Then take 1 tablet (0.5mg ) twice a day for four days. Patient not taking: Reported on 02/19/2017 01/09/17   Araceli Bouchealeigh N Rumley, DO  varenicline (CHANTIX) 1 MG tablet Please start after you have completed the course of Chantix 0.5mg  prescribed. Take one tablet (1mg ) twice a day for 11 weeks. Patient not taking: Reported on 02/19/2017 01/09/17   Araceli Bouchealeigh N Rumley, DO    Family History Family History  Problem Relation Age of Onset  . Diabetes Mother   . Hypertension Mother   . Diabetes Father     Social History Social History  Substance Use Topics  . Smoking status: Current Every Day Smoker    Packs/day: 0.50    Types: Cigarettes  . Smokeless tobacco: Never Used     Comment: in process of quitting  . Alcohol use No     Allergies   Patient has no known allergies.   Review of Systems Review of Systems   Physical Exam Updated Vital Signs BP 122/86   Pulse 78   Temp 98.1 F (36.7 C)   Resp 16   Ht 5\' 9"  (1.753 m)   Wt 122.5 kg   LMP 01/29/2017   SpO2 100%   BMI 39.87 kg/m   Physical Exam  Constitutional: She is oriented to person, place, and time. She appears well-developed and well-nourished.  HENT:  Head: Normocephalic and atraumatic.  Eyes: Conjunctivae are normal. Pupils are equal, round, and reactive to light. Right eye exhibits no discharge. Left eye exhibits no discharge. No scleral icterus.  Neck: Normal range of motion. No JVD present. No tracheal deviation present.  Pulmonary/Chest: Effort normal. No stridor.  Neurological: She is alert and oriented to person, place, and time. She has normal strength. No cranial nerve deficit or sensory deficit. Coordination normal. GCS eye subscore is 4. GCS verbal subscore is 5. GCS motor subscore  is 6.  Pt is blind. No nystagmus with EOM but difficult to truly assess with blindness. No reproduction of symptoms with head movement   Psychiatric: She has a normal mood and affect. Her behavior is normal. Judgment and thought content normal.  Nursing note and vitals reviewed.    ED Treatments / Results  Labs (all labs ordered are listed, but only abnormal results are displayed) Labs Reviewed - No data to display  EKG  EKG Interpretation None       Radiology No results found.  Procedures Procedures (including critical care time)  Medications Ordered in ED Medications - No data to display   Initial Impression / Assessment and Plan / ED Course  I have reviewed the triage vital signs and the nursing notes.  Pertinent labs &  imaging results that were available during my care of the patient were reviewed by me and considered in my medical decision making (see chart for details).       Final Clinical Impressions(s) / ED Diagnoses   Final diagnoses:  Paresthesia  Vertigo    Labs:   Imaging: EKG  Consults:  Therapeutics:  Discharge Meds:   Assessment/Plan:   36 year old female presents today with episode of vertigo.  Patient has a significant past medical history of the same with cardiology workup, internal medicine workup, with vestibular exercises.  Today's presentation is similar to previous with the addition of a paresthesia left fingers that promptly resolved without intervention.  Patient has been asymptomatic throughout her entire evaluation here, she has no other neurological deficits, I personally ambulated her down the hall with no dizziness shortness of breath palpitations or neurological deficits.  I have low suspicion for acute central cause of her vertigo as this is similar to previous.  Patient is stable for discharge and will follow up with primary care in the next 1-2 days for reassessment.  She is given strict return precautions, she verbalized  understanding and agreement to today's plan.    New Prescriptions New Prescriptions   No medications on file     Eyvonne Mechanic, PA-C 02/19/17 1321    Linwood Dibbles, MD 02/20/17 1301

## 2017-02-27 ENCOUNTER — Ambulatory Visit: Payer: Medicaid Other | Admitting: Student

## 2017-02-28 ENCOUNTER — Ambulatory Visit: Payer: Medicaid Other | Admitting: Internal Medicine

## 2017-03-02 ENCOUNTER — Encounter: Payer: Self-pay | Admitting: Family Medicine

## 2017-03-02 ENCOUNTER — Ambulatory Visit (INDEPENDENT_AMBULATORY_CARE_PROVIDER_SITE_OTHER): Payer: Medicaid Other | Admitting: Family Medicine

## 2017-03-02 VITALS — BP 112/74 | HR 75 | Temp 98.0°F | Ht 69.0 in | Wt 277.6 lb

## 2017-03-02 DIAGNOSIS — L732 Hidradenitis suppurativa: Secondary | ICD-10-CM

## 2017-03-02 DIAGNOSIS — E119 Type 2 diabetes mellitus without complications: Secondary | ICD-10-CM

## 2017-03-02 LAB — POCT GLYCOSYLATED HEMOGLOBIN (HGB A1C): HEMOGLOBIN A1C: 5.6

## 2017-03-02 MED ORDER — SULFAMETHOXAZOLE-TRIMETHOPRIM 800-160 MG PO TABS: 1.0000 | ORAL_TABLET | Freq: Two times a day (BID) | ORAL | 0 refills | Status: DC

## 2017-03-02 NOTE — Patient Instructions (Signed)
Thank you so much for coming to visit today! I have sent a prescription for Bactrim to the pharmacy to take twice daily for 7 days. The dermatologist you were referred to was [facility]. You can contact them at [phone]. Your A1C is 5.6 today.  Dr. Caroleen Hamman

## 2017-03-04 NOTE — Progress Notes (Signed)
Subjective:     Patient ID: Monica Huang, female   DOB: 17-Aug-1981, 36 y.o.   MRN: [id number]  HPI Monica Huang is a 36yo female presenting today for worsening flare of hidradenitis suppurativa.  Last seen for this complaint on 01/09/17. Since this visit notes boils in her bilateral groin has been worsening. Worse in right groin, but located bilaterally. Has been draining some in her right groin. Denies fever. Has completed course of Doxycycline, but has not noticed any improvement. Referred to Dermatology, but she has not heard from them since that time.  Review of Systems Per HPI    Objective:   Physical Exam  Constitutional: She appears well-developed and well-nourished. No distress.  Cardiovascular: Normal rate and regular rhythm.   No murmur heard. Pulmonary/Chest: Effort normal. No respiratory distress. She has no wheezes.  Skin:  Bilateral boils in groin, no drainage, no fluctuance. Hurley stage 3. No boils noted in axilla.      Assessment and Plan:     1. Hidradenitis suppurativa No improvement with Doxycycline. Prescription for Bactrim given due to prior improvement with this antibiotic. No fluctuance noted, so I&D not attempted today. Contact information for dermatology given--encouraged to follow up.

## 2017-03-09 ENCOUNTER — Ambulatory Visit: Payer: Medicaid Other | Admitting: Internal Medicine

## 2017-03-09 ENCOUNTER — Telehealth: Payer: Self-pay | Admitting: Family Medicine

## 2017-03-09 ENCOUNTER — Encounter: Payer: Self-pay | Admitting: Internal Medicine

## 2017-03-09 ENCOUNTER — Ambulatory Visit (INDEPENDENT_AMBULATORY_CARE_PROVIDER_SITE_OTHER): Payer: Medicaid Other | Admitting: Internal Medicine

## 2017-03-09 DIAGNOSIS — L732 Hidradenitis suppurativa: Secondary | ICD-10-CM | POA: Diagnosis present

## 2017-03-09 MED ORDER — IBUPROFEN 800 MG PO TABS: 800.0000 mg | ORAL_TABLET | Freq: Three times a day (TID) | ORAL | 0 refills | Status: DC | PRN

## 2017-03-09 MED ORDER — SULFAMETHOXAZOLE-TRIMETHOPRIM 800-160 MG PO TABS: 1.0000 | ORAL_TABLET | Freq: Two times a day (BID) | ORAL | 0 refills | Status: DC

## 2017-03-09 NOTE — Telephone Encounter (Signed)
Patient has been advised of this 

## 2017-03-09 NOTE — Telephone Encounter (Signed)
Pt called because when she went to get her appointment for dermatology they told that they are not taking any new medicaid patients at this time. jw

## 2017-03-09 NOTE — Telephone Encounter (Signed)
Received notice on 03/06/2017 that [facility] was not longer accepting Medicaid, effective February 1st, 2018. Patient's referral had been held until final decision had been made at [facility]. Patient never scheduled. Referral has now been sent to [facility] in [facility] as there are no other options in [place] at this time. Patient will contacted by Okey Dupre at [facility] to schedule appointment.

## 2017-03-09 NOTE — Telephone Encounter (Signed)
FMLA dept. Of labor form dropped off for at front desk for completion.  Verified that patient section of form has been completed.  Last DOS/WCC with PCP was 03/09/17.  Placed form in team folder to be completed by clinical staff.  Chari Manning

## 2017-03-09 NOTE — Patient Instructions (Addendum)
It was nice seeing you again today Ms. Lucado!  Please continue taking the Bactrim twice a day. You will complete a full ten day course. You can take ibuprofen for pain.   You will be called by Okey Dupre at [facility] in [place] on Monday for your dermatology appointment.   If you have any questions or concerns, please feel free to call the clinic.   Be well,  Dr. Natale Milch

## 2017-03-09 NOTE — Progress Notes (Signed)
   Subjective:   Patient: Monica Huang       Birthdate: 04-Aug-1981       MRN: [id number]      HPI  Monica Huang is a 36 y.o. female presenting for same day appointment for LE lesions.   Patient was seen by Dr. Caroleen Hamman on 04/06 for flare of hidradenitis suppurative. Current episode started on 2/13 and had been worsening. By the time she saw Dr. Caroleen Hamman, she had already completed a course of doxy without improvement. She was also referred to dermatology, but said she had not heard from them. Dr. Caroleen Hamman started patient on 7d Bactrim.  Today, patient says symptoms are worse than when she saw Dr. Caroleen Hamman. She says she is on day 5 of Bactrim now. The lesions of concern is in her R groin. She says it has now started draining spontaneously, but is still painful. Denies fevers, chills, abdominal pain, changes in appetite. Is generally doing well, other than the pain. She is keeping gauze over the draining area when possible.   Smoking status reviewed. Patient is current every day smoker.   Review of Systems See HPI.     Objective:  Physical Exam  Constitutional: She is oriented to person, place, and time and well-developed, well-nourished, and in no distress.  HENT:  Head: Normocephalic and atraumatic.  Eyes:  Legally blind.   Pulmonary/Chest: Effort normal. No respiratory distress.  Neurological: She is alert and oriented to person, place, and time.  Ambulating well with walking stick  Skin: Skin is warm and dry.  Large erythematous area in R inguinal fold with some fluctuance. Area of drainage noted however currently no drainage present. Tender to palpation. No additional lesions noted.   Psychiatric: Affect and judgment normal.      Assessment & Plan:  Hidradenitis Slightly improving with spontaneous drainage now, though still erythematous and painful. As spontaneously draining, will not I&D today. No systemic symptoms. Will continue Bactrim as patient is only on day 4, but will increase  to 10 day course from 7 day. Patient given prescription for additional three days of pills.  Spoke with Purvis Sheffield, who informed me that patient will be contacted by [facility] on 04/17 to schedule appointment with dermatology there. She should be able to be seen within next 7 days. Stressed to patient importance of keeping this appointment.  Strict return precautions given.    Tarri Abernethy, MD, MPH PGY-2 [facility] Pager [phone]

## 2017-03-13 NOTE — Assessment & Plan Note (Signed)
Slightly improving with spontaneous drainage now, though still erythematous and painful. As spontaneously draining, will not I&D today. No systemic symptoms. Will continue Bactrim as patient is only on day 4, but will increase to 10 day course from 7 day. Patient given prescription for additional three days of pills.  Spoke with Purvis Sheffield, who informed me that patient will be contacted by [facility] on 04/17 to schedule appointment with dermatology there. She should be able to be seen within next 7 days. Stressed to patient importance of keeping this appointment.  Strict return precautions given.

## 2017-03-13 NOTE — Telephone Encounter (Signed)
No clinical info needed to be completed on FMLA form.  Place form in Dr. Richarda Overlie box for completion.  Lamonte Sakai, Myldred Raju D, [place]

## 2017-03-14 NOTE — Telephone Encounter (Signed)
Please have patient schedule appointment 

## 2017-03-16 NOTE — Telephone Encounter (Signed)
Appt made for 03/19/2017. Sunday Spillers, CMA

## 2017-03-19 ENCOUNTER — Ambulatory Visit: Payer: Medicaid Other | Admitting: Family Medicine

## 2017-05-11 ENCOUNTER — Other Ambulatory Visit (HOSPITAL_COMMUNITY)
Admission: RE | Admit: 2017-05-11 | Discharge: 2017-05-11 | Disposition: A | Discharge status: Home / Self Care | Payer: Medicaid Other | Source: Ambulatory Visit | Attending: Family Medicine | Admitting: Family Medicine

## 2017-05-11 ENCOUNTER — Ambulatory Visit (INDEPENDENT_AMBULATORY_CARE_PROVIDER_SITE_OTHER): Payer: Medicaid Other | Admitting: Family Medicine

## 2017-05-11 ENCOUNTER — Encounter: Payer: Self-pay | Admitting: Student

## 2017-05-11 VITALS — BP 118/76 | HR 80 | Temp 98.3°F | Ht 68.0 in | Wt 270.0 lb

## 2017-05-11 DIAGNOSIS — N898 Other specified noninflammatory disorders of vagina: Secondary | ICD-10-CM | POA: Diagnosis not present

## 2017-05-11 LAB — POCT WET PREP (WET MOUNT)
Clue Cells Wet Prep Whiff POC: POSITIVE
TRICHOMONAS WET PREP HPF POC: ABSENT

## 2017-05-11 LAB — POCT URINALYSIS DIP (MANUAL ENTRY)
Bilirubin, UA: NEGATIVE
Blood, UA: NEGATIVE
Glucose, UA: NEGATIVE mg/dL
Ketones, POC UA: NEGATIVE mg/dL
LEUKOCYTES UA: NEGATIVE
NITRITE UA: NEGATIVE
PROTEIN UA: NEGATIVE mg/dL
Spec Grav, UA: 1.02 (ref 1.010–1.025)
UROBILINOGEN UA: 0.2 U/dL
pH, UA: 7 (ref 5.0–8.0)

## 2017-05-11 MED ORDER — FLUCONAZOLE 150 MG PO TABS: 150.0000 mg | ORAL_TABLET | Freq: Once | ORAL | 1 refills | Status: AC

## 2017-05-11 MED ORDER — METRONIDAZOLE 500 MG PO TABS: 500.0000 mg | ORAL_TABLET | Freq: Two times a day (BID) | ORAL | 0 refills | Status: DC

## 2017-05-11 NOTE — Assessment & Plan Note (Addendum)
Positive for BV and yeast. UA normal. Gonorrhea and chlamydia labs collected. -Flagyl 500 mg twice a day 7 days -Diflucan -Return precautions discussed

## 2017-05-11 NOTE — Patient Instructions (Signed)

## 2017-05-11 NOTE — Progress Notes (Signed)
Subjective:    Patient ID: Monica Huang , female   DOB: 1981-09-09 , 36 y.o..   MRN: [id number]  HPI  [name] is here for  Chief Complaint  Patient presents with  . poss. yeast infection   1.  VAGINAL DISCHARGE  Having vaginal discharge for 3 days. She thinks that maybe a boil drained because she has hyradenitis.  Medications tried: Takes bactrim every day  Discharge consistency: Can't tell- blind  Discharge color: can't tell blind Recent antibiotic use: On bactrim chronically, has been on for at least 1 year Sex in last month: Intercourse with the same partner. No protection. Wants to have a baby Possible STD exposure: Notknown   Symptoms Fever: No  Dysuria:No Vaginal bleeding: No  Abdomen or Pelvic pain: No  Back pain: No  Genital sores or ulcers:No  Rash: No  Pain during sex: No  Missed menstrual period: No, LMP was 5/26  Of note, recently had BV and yeast on wet prep on March 2018. Was treated with flagyl and diflucan.   ROS see HPI Smoking Status noted  Past Medical History: Patient Active Problem List   Diagnosis Date Noted  . Abdominal cramps 09/25/2016  . Vaginal discharge 07/05/2016  . Anxiety and depression 06/10/2016  . Syncope and collapse 12/29/2015  . Allergic rhinitis 05/24/2015  . Essential hypertension, benign 05/10/2015  . Well female exam with routine gynecological exam 03/10/2015  . Breast pain, right 02/11/2015  . Dizziness  09/30/2014  . Diabetes type 2, controlled (HCC) 02/19/2014  . Tongue lesion 12/02/2013  . Mechanical complication-ventricular(CSF) communicating shunt (HCC) 03/05/2013  . Hidradenitis 10/15/2012  . Right leg numbness 08/02/2012  . Low back pain radiating to right leg 02/13/2012  . Tobacco abuse 01/03/2012  . Legally blind 01/03/2012  . Obesity 01/03/2012  . Healthcare maintenance 01/03/2012  . Marijuana smoker 01/03/2012    Medications: reviewed   Social Hx:  reports that she has been smoking  Cigarettes.  She has been smoking about 0.50 packs per day. She has never used smokeless tobacco.   Objective:   BP 118/76 (BP Location: Right Arm, Patient Position: Sitting, Cuff Size: Large)   Pulse 80   Temp 98.3 F (36.8 C) (Oral)   Ht 5\' 8"  (1.727 m)   Wt 270 lb (122.5 kg)   LMP 05/02/2017   SpO2 99%   BMI 41.05 kg/m  Physical Exam  Gen: NAD, alert, cooperative with exam, well-appearing, morbidly obese HEENT: NCAT, PERRL, clear conjunctiva, oropharynx clear, supple neck GYN:  External genitalia within normal limits. Vaginal mucosa pink, moist, normal rugae. Nonfriable cervix without lesions, white creamy discharge or bleeding noted on speculum exam.  Bimanual exam revealed normal, nongravid uterus.  No cervical motion tenderness. No adnexal masses bilaterally.    Results for orders placed or performed in visit on 05/11/17  POCT Wet Prep [facility])  Result Value Ref Range   Source Wet Prep POC VAG    WBC, Wet Prep HPF POC 0-3    Bacteria Wet Prep HPF POC Many (A) Few   Clue Cells Wet Prep HPF POC Many (A) None   Clue Cells Wet Prep Whiff POC Positive Whiff    Yeast Wet Prep HPF POC Moderate    Trichomonas Wet Prep HPF POC Absent Absent  POCT urinalysis dipstick  Result Value Ref Range   Color, UA yellow yellow   Clarity, UA clear clear   Glucose, UA negative negative mg/dL   Bilirubin, UA negative negative  Ketones, POC UA negative negative mg/dL   Spec Grav, UA 8.2951.020 6.2131.010 - 1.025   Blood, UA negative negative   pH, UA 7.0 5.0 - 8.0   Protein Ur, POC negative negative mg/dL   Urobilinogen, UA 0.2 0.2 or 1.0 E.U./dL   Nitrite, UA Negative Negative   Leukocytes, UA Negative Negative     Assessment & Plan:  Vaginal discharge Positive for BV and yeast. UA normal. Gonorrhea and chlamydia labs collected. -Flagyl 500 mg twice a day 7 days -Diflucan -Return precautions discussed  Orders Placed This Encounter  Procedures  . POCT Wet Prep [employer]([facility] [place])  . POCT  urinalysis dipstick   Meds ordered this encounter  Medications  . metroNIDAZOLE (FLAGYL) 500 MG tablet    Sig: Take 1 tablet (500 mg total) by mouth 2 (two) times daily.    Dispense:  14 tablet    Refill:  0  . fluconazole (DIFLUCAN) 150 MG tablet    Sig: Take 1 tablet (150 mg total) by mouth once.    Dispense:  1 tablet    Refill:  1    Anders Simmondshristina Nakeesha Bowler, MD [facility], PGY-2

## 2017-05-14 LAB — CERVICOVAGINAL ANCILLARY ONLY
CHLAMYDIA, DNA PROBE: NEGATIVE
NEISSERIA GONORRHEA: NEGATIVE

## 2017-06-07 ENCOUNTER — Ambulatory Visit: Payer: Medicaid Other | Admitting: Student in an Organized Health Care Education/Training Program

## 2017-06-07 NOTE — Progress Notes (Deleted)
   CC: ***  HPI: Monica Huang is a 36 y.o. female with PMH significant for *** [facility] *** of *** duration.    Seen 6/15, diagnosed with BV and yeast infection. GC/Chla were negative at that time. Asked for  - Flagyl 500 BID x7d for BV - Diflucan for yeast    Overdue health maintenance ***  Review of Symptoms:  See HPI for ROS.   CC, SH/smoking status, and VS noted.  Objective: There were no vitals taken for this visit. GEN: NAD, alert, cooperative, and pleasant.*** EYE: no conjunctival injection, pupils equally round and reactive to light ENMT: normal tympanic light reflex, no nasal polyps,no rhinorrhea, no pharyngeal erythema or exudates NECK: full ROM, no thyromegaly RESPIRATORY: clear to auscultation bilaterally with no wheezes, rhonchi or rales, good effort CV: RRR, no m/r/g, no peripheral edema GI: soft, non-tender, non-distended, normoactive bowel sounds, no hepatosplenomegaly SKIN: warm and dry, no rashes or lesions NEURO: II-XII grossly intact, normal gait, peripheral sensation intact PSYCH: AAOx3, appropriate affect  Flu Vaccine: *** Tdap Vaccine: *** - every 1441yrs - (<3 lifetime doses or unknown): all wounds -- look up need for Tetanus IG - (>=3 lifetime doses): clean/minor wound if >8341yrs from previous; all other wounds if >2264yrs from previous Zoster Vaccine: *** (those >50yo, once) Pneumonia Vaccine: *** (those w/ risk factors) - (<6157yr) Both: Immunocompromised, cochlear implant, CSF leak, asplenic, sickle cell, Chronic Renal Failure - (<5157yr) PPSV-23 only: Heart dz, lung disease, DM, tobacco abuse, alcoholism, cirrhosis/liver disease. - (>2757yr): PPSV13 then PPSV23 in 6-12mths;  - (>6257yr): repeat PPSV23 once if pt received prior to 35yo and 364yrs have passed  Assessment and plan:  No problem-specific Assessment & Plan notes found for this encounter.   No orders of the defined types were placed in this encounter.   No orders of  the defined types were placed in this encounter.    Howard PouchLauren Leighana Neyman, MD,MS,  PGY2 06/07/2017 10:39 AM

## 2017-07-26 ENCOUNTER — Ambulatory Visit (HOSPITAL_COMMUNITY)
Admission: RE | Admit: 2017-07-26 | Discharge: 2017-07-26 | Disposition: A | Discharge status: Home / Self Care | Payer: Medicaid Other | Source: Ambulatory Visit | Attending: Family Medicine | Admitting: Family Medicine

## 2017-07-26 ENCOUNTER — Encounter: Payer: Self-pay | Admitting: Student in an Organized Health Care Education/Training Program

## 2017-07-26 ENCOUNTER — Ambulatory Visit (INDEPENDENT_AMBULATORY_CARE_PROVIDER_SITE_OTHER): Payer: Medicaid Other | Admitting: Student in an Organized Health Care Education/Training Program

## 2017-07-26 VITALS — BP 116/78 | HR 77 | Temp 98.1°F | Wt 267.0 lb

## 2017-07-26 DIAGNOSIS — R42 Dizziness and giddiness: Secondary | ICD-10-CM

## 2017-07-26 DIAGNOSIS — E119 Type 2 diabetes mellitus without complications: Secondary | ICD-10-CM | POA: Diagnosis not present

## 2017-07-26 DIAGNOSIS — I440 Atrioventricular block, first degree: Secondary | ICD-10-CM | POA: Insufficient documentation

## 2017-07-26 DIAGNOSIS — I498 Other specified cardiac arrhythmias: Secondary | ICD-10-CM | POA: Diagnosis not present

## 2017-07-26 LAB — POCT GLYCOSYLATED HEMOGLOBIN (HGB A1C): HEMOGLOBIN A1C: 5.4

## 2017-07-26 NOTE — Assessment & Plan Note (Signed)
-   well controlled, A1c 5.4 - patient prefers q7922m checks, would be reasonable to space to every 6 months given it is very well controlled with only diet and exercise - no intervention or change at this time

## 2017-07-26 NOTE — Assessment & Plan Note (Addendum)
Uncertain etiology. Hypoglycemia not likely as she is not on any antihyperglycemic agents. Orthostatics were negative. No obvious iatrogenic causes. Cardiac etiology considered given she does have a history of LOC which she states was a year ago. - Patient has been evaluated by cardiology in the past, however given her constellation of symptoms and EKG at that time she was not considered to need an event monitor.  - will check CBC to think of anemia, TSH - EKG today borderline, irregular bradycardia with dropped beats likely 2/2 PAC's, not thought to be consistent with heart block - encourage hydration - Given the duration of symptoms and that she does endorse losing consciousness one time, I will refer to cardiology for consideration for an event monitor

## 2017-07-26 NOTE — Progress Notes (Signed)
   CC: Dizziness  HPI: Monica Huang is a 36 y.o. female with PMH significant for blindness, well-controlled diabetes, and hidradinitis who [facility] today with dizziness.  Diabetes - under control with lifestyle changes - prefers to have A1c every 3 months despite having very good control - denies changes in vision, no increased urination, denies symptoms of neuropathy, no N/V/D/C, no chest pain or palpitations  Dizziness - described as light-headedness, feeling like she could pass out. - worse with standing, sometimes does come on while she is sitting - bothers her at least 2 times every day - notes her vision is worse with dizziness (patient has minimal vision at baseline) - Does not endorse feeling as though room is spinning - feels like light-headedness, like she could faint - did lose consciousness one time but this was a year ago per patient report - no palpitations, feels like heart beats slower when she feels dizzy  Review of Symptoms:  See HPI for ROS.   CC, SH/smoking status, and VS noted.  Objective: BP 116/78   Pulse 77   Temp 98.1 F (36.7 C) (Oral)   Wt 267 lb (121.1 kg)   LMP 07/20/2017   BMI 40.60 kg/m  GEN: NAD, alert, cooperative, and pleasant. RESPIRATORY: clear to auscultation bilaterally with no wheezes, rhonchi or rales, good effort CV: RRR, no m/r/g, no LE edema GI: soft, non-tender, non-distended, no hepatosplenomegaly SKIN: warm and dry, no rashes or lesions NEURO: II-XII grossly intact, normal gait, peripheral sensation intact PSYCH: AAOx3, appropriate affect  EKG: Bradycardia at 58 BMP, Irregular rhythm with dropped beats, may be secondary to PAC's. No mobitz block. Borderline EKG.  Assessment and plan:  Dizziness  Uncertain etiology. Hypoglycemia not likely as she is not on any antihyperglycemic agents. Orthostatics were negative. No obvious iatrogenic causes. Cardiac etiology considered given she does have a history of LOC which she  states was a year ago. - Patient has been evaluated by cardiology in the past, however given her constellation of symptoms and EKG at that time she was not considered to need an event monitor.  - will check CBC to think of anemia, TSH - EKG today borderline, irregular bradycardia with dropped beats likely 2/2 PAC's, not thought to be consistent with heart block - encourage hydration - Given the duration of symptoms and that she does endorse losing consciousness one time, I will refer to cardiology for consideration for an event monitor   Diabetes type 2, controlled - well controlled, A1c 5.4 - patient prefers q5376m checks, would be reasonable to space to every 6 months given it is very well controlled with only diet and exercise - no intervention or change at this time  Orders Placed This Encounter  Procedures  . TSH  . CBC  . Ambulatory referral to Cardiology    Referral Priority:   Routine    Referral Type:   Consultation    Referral Reason:   Specialty Services Required    Requested Specialty:   Cardiology    Number of Visits Requested:   1  . POCT HgB A1C (CPT 83036)  . EKG 12-Lead    Howard PouchLauren Leda Bellefe[place]ille, MD,MS,  PGY2 07/26/2017 5:19 PM

## 2017-07-26 NOTE — Patient Instructions (Signed)
It was a pleasure seeing you today in our clinic.   - We got labs. I will call you with the results - A consult was placed to cardiology at today's visit.  You will receive a call to schedule an appointment. If you do not receive a call within two weeks please call our office so we can place the consult again.  Our clinic's number is [phone]. Please call with questions or concerns about what we discussed today.  Be well, Dr. Mosetta PuttFeng

## 2017-07-27 ENCOUNTER — Encounter: Payer: Self-pay | Admitting: Student in an Organized Health Care Education/Training Program

## 2017-07-27 LAB — CBC
HEMATOCRIT: 40.1 % (ref 34.0–46.6)
Hemoglobin: 13.5 g/dL (ref 11.1–15.9)
MCH: 32.8 pg (ref 26.6–33.0)
MCHC: 33.7 g/dL (ref 31.5–35.7)
MCV: 98 fL — ABNORMAL HIGH (ref 79–97)
PLATELETS: 364 10*3/uL (ref 150–379)
RBC: 4.11 x10E6/uL (ref 3.77–5.28)
RDW: 13.9 % (ref 12.3–15.4)
WBC: 7.4 10*3/uL (ref 3.4–10.8)

## 2017-07-27 LAB — TSH: TSH: 2.24 u[IU]/mL (ref 0.450–4.500)

## 2017-08-22 ENCOUNTER — Encounter: Payer: Self-pay | Admitting: Cardiology

## 2017-08-30 ENCOUNTER — Encounter: Payer: Self-pay | Admitting: Cardiovascular Disease

## 2017-08-30 ENCOUNTER — Ambulatory Visit: Payer: Self-pay | Admitting: Cardiology

## 2017-08-30 ENCOUNTER — Ambulatory Visit (INDEPENDENT_AMBULATORY_CARE_PROVIDER_SITE_OTHER): Payer: Medicaid Other | Admitting: Cardiovascular Disease

## 2017-08-30 VITALS — BP 112/64 | HR 85 | Ht 68.0 in | Wt 274.6 lb

## 2017-08-30 DIAGNOSIS — R55 Syncope and collapse: Secondary | ICD-10-CM | POA: Diagnosis not present

## 2017-08-30 DIAGNOSIS — I10 Essential (primary) hypertension: Secondary | ICD-10-CM

## 2017-08-30 DIAGNOSIS — Z720 Tobacco use: Secondary | ICD-10-CM

## 2017-08-30 DIAGNOSIS — H5410 Blindness, one eye, low vision other eye, unspecified eyes: Secondary | ICD-10-CM

## 2017-08-30 NOTE — Patient Instructions (Addendum)
Medication Instructions:  Your physician recommends that you continue on your current medications as directed. Please refer to the Current Medication list given to you today.  Testing/Procedures: Your physician has requested that you have an echocardiogram. Echocardiography is a painless test that uses sound waves to create images of your heart. It provides your doctor with information about the size and shape of your heart and how well your heart's chambers and valves are working. This procedure takes approximately one hour. There are no restrictions for this procedure.  This will be done at our [facility] location:  [address]  Your physician has recommended that you wear a 48 hour holter monitor. Holter monitors are medical devices that record the heart's electrical activity. Doctors most often use these monitors to diagnose arrhythmias. Arrhythmias are problems with the speed or rhythm of the heartbeat. The monitor is a small, portable device. You can wear one while you do your normal daily activities. This is usually used to diagnose what is causing palpitations/syncope (passing out).  Follow-Up: AS NEEDED with Dr. Tresa Endo (unless test are abnormal)  Any Other Special Instructions Will Be Listed Below (If Applicable).   How to Use Compression Stockings Compression stockings are elastic socks that squeeze the legs. They help to increase blood flow to the legs, decrease swelling in the legs, and reduce the chance of developing blood clots in the lower legs. Compression stockings are often used by people who:  Are recovering from surgery.  Have poor circulation in their legs.  Are prone to getting blood clots in their legs.  Have varicose veins.  Sit or stay in bed for long periods of time.  How to use compression stockings Before you put on your compression stockings:  Make sure that they are the correct size. If you do not know your size, ask your health care  provider.  Make sure that they are clean, dry, and in good condition.  Check them for rips and tears. Do not put them on if they are ripped or torn.  Put your stockings on first thing in the morning, before you get out of bed. Keep them on for as long as your health care provider advises. When you are wearing your stockings:  Keep them as smooth as possible. Do not allow them to bunch up. It is especially important to prevent the stockings from bunching up around your toes or behind your knees.  Do not roll the stockings downward and leave them rolled down. This can decrease blood flow to your leg.  Change them right away if they become wet or dirty. 1.   When you take off your stockings, inspect your legs and feet. Anything that does not seem normal may require medical attention. Look for:  Open sores.  Red spots.  Swelling.  Information and tips  Do not stop wearing your compression stockings without talking to your health care provider first.  Wash your stockings every day with mild detergent in cold or warm water. Do not use bleach. Air-dry your stockings or dry them in a clothes dryer on low heat.  Replace your stockings every 3-6 months.  If skin moisturizing is part of your treatment plan, apply lotion or cream at night so that your skin will be dry when you put on the stockings in the morning. It is harder to put the stockings on when you have lotion on your legs or feet. Contact a health care provider if: Remove your  stockings and seek medical care if:  You have a feeling of pins and needles in your feet or legs.  You have any new changes in your skin.  You have skin lesions that are getting worse.  You have swelling or pain that is getting worse.  Get help right away if:  You have numbness or tingling in your lower legs that does not get better right after you take the stockings off.  Your toes or feet become cold and blue.  You develop open sores or red  spots on your legs that do not go away.  You see or feel a warm spot on your leg.  You have new swelling or soreness in your leg.  You are short of breath or you have chest pain for no reason.  You have a rapid or irregular heartbeat.  You feel light-headed or dizzy. This information is not intended to replace advice given to you by your health care provider. Make sure you discuss any questions you have with your health care provider. Document Released: 09/10/2009 Document Revised: 04/12/2016 Document Reviewed: 10/21/2014 Elsevier Interactive Patient Education  [employer].    If you need a refill on your cardiac medications before your next appointment, please call your pharmacy.

## 2017-08-30 NOTE — Progress Notes (Signed)
Cardiology Office Note    Date:  09/05/2017   ID:  Monica Huang, DOB 03-Nov-1981, MRN [id number]  PCP:  Everrett Coombe, MD  Cardiologist:  [name], MD   New evaluation for me referred by Monica Huang family Monica Huang by Dr.Feng for evaluation of dizziness and possible event monitor  History of Present Illness:  Monica Huang is a 36 y.o. female who has a history of blindness, diabetes mellitus, and hidradenitis who is referred through the courtesy of Dr. Annamaria Boots for further evaluation of dizziness and possible event monitor.  [name] is legally blind secondary to pseudotumor cerebri and is status post VP shunting.  In 2017.  She was seen by Dr. Gwenlyn Found for evaluation of presyncope.  She had a history of hypertension on diuretics as well as diabetes.  She was smoking 1 pack of cigarettes per day.  She denied chest pain or dyspnea.  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  She often felt dizzy when she would get up.  He did not feel her cardiac exam was significant and at that point did not recommend further cardiac testing.  The patient states that she has had recurrent episodes of dizziness, particularly if she sits for long periods of time.  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  She denies any recent episodes of syncope.  She denies any episodes of chest pressure.  She has noticed lightheadedness.  At times she notes her heart rate increases.  She denies any awareness of significant arrhythmia.  She denies chest pressure.  She continues to smoke one half pack per day.  [facility] and because of her continued recurrent symptomatology and an ECG which suggested irregular bradycardia with potential dropped beats, most likely due to PACs.  She was referred for cardiology reevaluation.  Past Medical History:  Diagnosis Date  . Chronic back pain   . Chronic leg pain   . Diabetes mellitus  without complication ([place])   . Dizziness   . Hydradenitis   . Hyperlipidemia   . Legally blind   . Macular degeneration   . Pseudotumor cerebri syndrome 2005   shunt placed- and legally blind  . Sciatica     Past Surgical History:  Procedure Laterality Date  . CSF SHUNT     2 revisions    Current Medications: Outpatient Medications Prior to Visit  Medication Sig Dispense Refill  . sulfamethoxazole-trimethoprim (BACTRIM DS,SEPTRA DS) 800-160 MG tablet Take 1 tablet by mouth 2 (two) times daily. 6 tablet 0  . gabapentin (NEURONTIN) 100 MG capsule Take 1 capsule (100 mg total) by mouth 3 (three) times daily. 90 capsule 3  . ibuprofen (ADVIL,MOTRIN) 800 MG tablet Take 1 tablet (800 mg total) by mouth every 8 (eight) hours as needed. 30 tablet 0  . varenicline (CHANTIX) 0.5 MG tablet Please take 1 tablet (0.44m) once a day for three days. Then take 1 tablet (0.5235m twice a day for four days. (Patient not taking: Reported on 02/19/2017) 11 tablet 0  . varenicline (CHANTIX) 1 MG tablet Please start after you have completed the course of Chantix 0.35m16mrescribed. Take one tablet (1mg18mwice a day for 11 weeks. (Patient not taking: Reported on 02/19/2017) 154 tablet 0   No facility-administered medications prior to visit.      Allergies:   Patient has no known allergies.   Social History   Social  History  . Marital status: Single    Spouse name: N/A  . Number of children: N/A  . Years of education: N/A   Social History Main Topics  . Smoking status: Current Every Day Smoker    Packs/day: 0.50    Types: Cigarettes  . Smokeless tobacco: Never Used     Comment: in process of quitting  . Alcohol use No  . Drug use: No     Comment: marijuana -- bag per day.   Marland Kitchen Sexual activity: Yes    Birth control/ protection: None     Comment: 1 partners   Other Topics Concern  . None   Social History Narrative   On disability since 2005 due to vision loss from psuedotumor cerebri.   Did  not finish highschool-- completed the 9th grade.    Walking 2 x week for 54mn.            Family History:  The patient's family history includes Diabetes in her father and mother; Hypertension in her mother.   ROS General: Negative; No fevers, chills, or night sweats;  HEENT: She is almost totally blind but can see, faint images.  hearing, sinus congestion, difficulty swallowing Pulmonary: Negative; No cough, wheezing, shortness of breath, hemoptysis Cardiovascular: Negative; No chest pain, presyncope, syncope, palpitations GI: Negative; No nausea, vomiting, diarrhea, or abdominal pain GU: Negative; No dysuria, hematuria, or difficulty voiding Musculoskeletal: Negative; no myalgias, joint pain, or weakness Hematologic/Oncology: Negative; no easy bruising, bleeding Endocrine: Negative; no heat/cold intolerance; no diabetes Neuro: History of pseudotumor cerebri Skin: Negative; No rashes or skin lesions Psychiatric: Negative; No behavioral problems, depression Sleep: Negative; No snoring, daytime sleepiness, hypersomnolence, bruxism, restless legs, hypnogognic hallucinations, no cataplexy Other comprehensive 14 point system review is negative.   PHYSICAL EXAM:   VS:  BP 112/64   Pulse 85   Ht '5\' 8"'  (1.727 m)   Wt 274 lb 9.6 oz (124.6 kg)   BMI 41.75 kg/m     Repeat blood pressure by me was 120/80 supine; 116/80 standing.  Wt Readings from Last 3 Encounters:  08/30/17 274 lb 9.6 oz (124.6 kg)  07/26/17 267 lb (121.1 kg)  05/11/17 270 lb (122.5 kg)    General: Alert, oriented, no distress. Morbidly obese Skin: normal turgor, no rashes, warm and dry HEENT: Normocephalic, atraumatic. Pupils equal round and reactive to light; sclera anicteric; extraocular muscles intact; Fundi Not well-visualized Nose without nasal septal hypertrophy Mouth/Parynx benign; Mallinpatti scale 3 Neck: No JVD, no carotid bruits; normal carotid upstroke Lungs: clear to ausculatation and percussion; no  wheezing or rales Chest wall: without tenderness to palpitation Heart: PMI not displaced, RRR, s1 s2 normal, 1/6 systolic murmur, no diastolic murmur, no rubs, gallops, thrills, or heaves Abdomen: Central adiposity soft, nontender; no hepatosplenomehaly, BS+; abdominal aorta nontender and not dilated by palpation. Back: no CVA tenderness Pulses 2+ Musculoskeletal: full range of motion, normal strength, no joint deformities Extremities: Trace ankle edema;no clubbing cyanosis,  Homan's sign negative  Neurologic: grossly nonfocal; Cranial nerves grossly wnl Psychologic: Normal mood and affect   Studies/Labs Reviewed:   EKG:  EKG is ordered today.  ECG (independently read by me): Normal sinus rhythm at 85 bpm.  First-degree AV block with PR interval at 282 ms..  No ectopy.  Possible left atrial enlargement.  Recent Labs: BMP Latest Ref Rng & Units 12/21/2015 05/10/2015 10/11/2014  Glucose 65 - 99 mg/dL 84 80 89  BUN 6 - 20 mg/dL '11 9 10  ' Creatinine 0.44 -  1.00 mg/dL 0.85 0.69 0.62  Sodium 135 - 145 mmol/L 138 137 141  Potassium 3.5 - 5.1 mmol/L 3.8 4.0 4.4  Chloride 101 - 111 mmol/L 102 99 107  CO2 22 - 32 mmol/L '24 23 22  ' Calcium 8.9 - 10.3 mg/dL 9.3 9.3 9.2     Hepatic Function Latest Ref Rng & Units 05/10/2015 01/01/2012  Total Protein 6.0 - 8.3 g/dL 7.5 6.9  Albumin 3.5 - 5.2 g/dL 4.1 4.1  AST 0 - 37 U/L 12 16  ALT 0 - 35 U/L 20 15  Alk Phosphatase 39 - 117 U/L 54 48  Total Bilirubin 0.2 - 1.2 mg/dL 0.3 0.4    CBC Latest Ref Rng & Units 07/26/2017 12/21/2015 05/10/2015  WBC 3.4 - 10.8 x10E3/uL 7.4 10.7(H) 9.4  Hemoglobin 11.1 - 15.9 g/dL 13.5 14.6 13.7  Hematocrit 34.0 - 46.6 % 40.1 42.7 41.7  Platelets 150 - 379 x10E3/uL 364 361 349   Lab Results  Component Value Date   MCV 98 (H) 07/26/2017   MCV 95.7 12/21/2015   MCV 95.6 05/10/2015   Lab Results  Component Value Date   TSH 2.240 07/26/2017   Lab Results  Component Value Date   HGBA1C 5.4 07/26/2017     BNP No  results found for: BNP  ProBNP No results found for: PROBNP   Lipid Panel     Component Value Date/Time   CHOL 147 03/11/2015 0841   TRIG 106 03/11/2015 0841   HDL 45 (L) 03/11/2015 0841   CHOLHDL 3.3 03/11/2015 0841   VLDL 21 03/11/2015 0841   LDLCALC 81 03/11/2015 0841   LDLDIRECT 100 (H) 01/01/2012 1155     RADIOLOGY: No results found.    ASSESSMENT:    1. Pre-syncope   2. Essential hypertension, benign   3. Tobacco abuse   4. Blindness and low vision   5. Morbid obesity [facility])      PLAN:  Ms. Waldo is a 36 year old African-American female who has a history of morbid obesity, hypertension, and has experienced episodes of dizziness and presyncope, particularly after periods of long sitting.  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  When she tries to stand she often gets dizzy.  She denies any chest tightness or pressure.  She admits to 1 episode remotely in the past several years ago where she may have blacked out for short duration.  On exam today, she is not orthostatic.  Her ECG reveals normal sinus rhythm in the 80s with first-degree AV block.  She has a short systolic murmur.  I am scheduling her for a 2-D echo Doppler study to assess systolic and diastolic function.  I have recommended support stockings which may help potential mild leg swelling with long duration of sitting.  Since she continued to have recurrent symptomatology, I will schedule her for a 48 hour Holter monitor to assess any potential for cardiac arrhythmia.  I discussed with her the importance of smoking cessation.  We discussed weight loss.  We discussed potential symptoms of obstructive sleep apnea particularly with her large body habitus and morbid obesity and thick neck.   Medication Adjustments/Labs and Tests Ordered: Current medicines are reviewed at length with the patient today.  Concerns regarding medicines are outlined above.   Medication changes, Labs and Tests ordered today are listed in the Patient Instructions below. Patient Instructions  Medication Instructions:  Your physician recommends that you continue on your  current medications as directed. Please refer to the Current Medication list given to you today.  Testing/Procedures: Your physician has requested that you have an echocardiogram. Echocardiography is a painless test that uses sound waves to create images of your heart. It provides your doctor with information about the size and shape of your heart and how well your heart's chambers and valves are working. This procedure takes approximately one hour. There are no restrictions for this procedure.  This will be done at our [facility] location:  [place] has recommended that you wear a 48 hour holter monitor. Holter monitors are medical devices that record the heart's electrical activity. Doctors most often use these monitors to diagnose arrhythmias. Arrhythmias are problems with the speed or rhythm of the heartbeat. The monitor is a small, portable device. You can wear one while you do your normal daily activities. This is usually used to diagnose what is causing palpitations/syncope (passing out).  Follow-Up: AS NEEDED with Dr. Claiborne Billings (unless test are abnormal)  Any Other Special Instructions Will Be Listed Below (If Applicable).   How to Use Compression Stockings Compression stockings are elastic socks that squeeze the legs. They help to increase blood flow to the legs, decrease swelling in the legs, and reduce the chance of developing blood clots in the lower legs. Compression stockings are often used by people who:  Are recovering from surgery.  Have poor circulation in their legs.  Are prone to getting blood clots in their legs.  Have varicose veins.  Sit or stay in bed for long periods of time.  How to use compression stockings Before you put on your  compression stockings:  Make sure that they are the correct size. If you do not know your size, ask your health care provider.  Make sure that they are clean, dry, and in good condition.  Check them for rips and tears. Do not put them on if they are ripped or torn.  Put your stockings on first thing in the morning, before you get out of bed. Keep them on for as long as your health care provider advises. When you are wearing your stockings:  Keep them as smooth as possible. Do not allow them to bunch up. It is especially important to prevent the stockings from bunching up around your toes or behind your knees.  Do not roll the stockings downward and leave them rolled down. This can decrease blood flow to your leg.  Change them right away if they become wet or dirty. 1.   When you take off your stockings, inspect your legs and feet. Anything that does not seem normal may require medical attention. Look for:  Open sores.  Red spots.  Swelling.  Information and tips  Do not stop wearing your compression stockings without talking to your health care provider first.  Wash your stockings every day with mild detergent in cold or warm water. Do not use bleach. Air-dry your stockings or dry them in a clothes dryer on low heat.  Replace your stockings every 3-6 months.  If skin moisturizing is part of your treatment plan, apply lotion or cream at night so that your skin will be dry when you put on the stockings in the morning. It is harder to put the stockings on when you have lotion on your legs or feet. Contact a health care provider if: Remove your stockings and seek medical care if:  You have a feeling  of pins and needles in your feet or legs.  You have any new changes in your skin.  You have skin lesions that are getting worse.  You have swelling or pain that is getting worse.  Get help right away if:  You have numbness or tingling in your lower legs that does not get better  right after you take the stockings off.  Your toes or feet become cold and blue.  You develop open sores or red spots on your legs that do not go away.  You see or feel a warm spot on your leg.  You have new swelling or soreness in your leg.  You are short of breath or you have chest pain for no reason.  You have a rapid or irregular heartbeat.  You feel light-headed or dizzy. This information is not intended to replace advice given to you by your health care provider. Make sure you discuss any questions you have with your health care provider. Document Released: 09/10/2009 Document Revised: 04/12/2016 Document Reviewed: 10/21/2014 Elsevier Interactive Patient Education  [employer].    If you need a refill on your cardiac medications before your next appointment, please call your pharmacy.      Signed, [name], MD  09/05/2017 7:38 PM    [place] [address] [place], [place], [place]  [address] Phone: [phone]

## 2017-09-03 ENCOUNTER — Telehealth: Payer: Self-pay | Admitting: Student in an Organized Health Care Education/Training Program

## 2017-09-03 NOTE — Telephone Encounter (Signed)
Pt called because she needs a letter from her pcp stating that it is okay for her to have oral surgery this week. Please call their office or fax them the information. Patient said that all the information is in her chart. jw

## 2017-09-05 NOTE — Telephone Encounter (Signed)
Given that patient is currently being evaluated by cardiology for a possible arrhythmia (she had a visit for a holter monitor earlier this month) and under ongoing evaluation, I think it would be appropriate for her to get cardiology to sign off on a dental procedure with anesthesia.  The patient should call and ask her cardiologist if moving forward with a dental procedure is appropriate at this time. Can you ask her to call them? Thank you.

## 2017-09-05 NOTE — Telephone Encounter (Signed)
Do you mind calling patient to get info for her oral surgeon's office and what type of procedure she having for this week? I don't see it in the chart.   Thank you!

## 2017-09-05 NOTE — Telephone Encounter (Signed)
Spoke to pt. She is having 8 teeth pulled and 1 tooth cut out. Pt is going to have Dr. Ocie Doyne do the surgery. His office is located at: [address], [place], [place] [address]. Sunday Spillers, CMA

## 2017-09-07 NOTE — Telephone Encounter (Signed)
Spoke to patient per Dr. Latanya Maudlin note and asked her if she would call Cardiologist to see if it was safe for her to proceed with dental procedure.  She told me that she had already spoken to the cardiologist and they decided to wait until after her heart issues are done before proceeding. She says they gave her a date of the 22nd.Glennie Hawk

## 2017-09-17 ENCOUNTER — Other Ambulatory Visit: Payer: Self-pay

## 2017-09-17 ENCOUNTER — Ambulatory Visit (INDEPENDENT_AMBULATORY_CARE_PROVIDER_SITE_OTHER): Payer: Medicaid Other

## 2017-09-17 ENCOUNTER — Ambulatory Visit (HOSPITAL_COMMUNITY): Payer: Medicaid Other | Attending: Cardiology

## 2017-09-17 DIAGNOSIS — I081 Rheumatic disorders of both mitral and tricuspid valves: Secondary | ICD-10-CM | POA: Diagnosis not present

## 2017-09-17 DIAGNOSIS — H547 Unspecified visual loss: Secondary | ICD-10-CM | POA: Diagnosis not present

## 2017-09-17 DIAGNOSIS — E669 Obesity, unspecified: Secondary | ICD-10-CM | POA: Insufficient documentation

## 2017-09-17 DIAGNOSIS — I10 Essential (primary) hypertension: Secondary | ICD-10-CM | POA: Diagnosis not present

## 2017-09-17 DIAGNOSIS — R55 Syncope and collapse: Secondary | ICD-10-CM

## 2017-09-17 DIAGNOSIS — Z720 Tobacco use: Secondary | ICD-10-CM | POA: Diagnosis not present

## 2017-09-17 DIAGNOSIS — E119 Type 2 diabetes mellitus without complications: Secondary | ICD-10-CM | POA: Insufficient documentation

## 2017-09-17 DIAGNOSIS — R42 Dizziness and giddiness: Secondary | ICD-10-CM | POA: Insufficient documentation

## 2017-10-01 ENCOUNTER — Telehealth: Payer: Self-pay | Admitting: Student in an Organized Health Care Education/Training Program

## 2017-10-01 NOTE — Telephone Encounter (Signed)
Would like the doctor to call her back with information on what needs to be done next.

## 2017-10-01 NOTE — Telephone Encounter (Signed)
Pt called and said she went to heart doctor a couple of weeks ago and have not heard anything back from them yet. She is waiting to have oral surgery, but they will not operate until she gets the okay from her PCP regarding her heart doctor results. Pt says she has called her heart doctor and they will not tell her anything about her results.

## 2017-10-02 ENCOUNTER — Telehealth: Payer: Self-pay | Admitting: Cardiovascular Disease

## 2017-10-02 NOTE — Telephone Encounter (Signed)
Pt states that she is waiting for clearance to go to dentist [name] for procedure, just a reminder

## 2017-10-02 NOTE — Telephone Encounter (Signed)
Monica Huang is calling to get her results from the Echo and heart monitor. Please call

## 2017-10-02 NOTE — Telephone Encounter (Signed)
ECHO result:Notes recorded by Lennette BihariKelly, Thomas A, MD on 09/30/2017 at 10:12 PM EST Normal systolic and diastolic function. Mild MR and TR Preliminary monitor result: Sinus Bradycardia / Sinus rhythm / Sinus Arrhythmia/ Sinus Tachycardia, with First Degree AV Block, and 2nd Degree AV Block. PVC's, and PAC's, atrial pairs. NO symptoms reported on diary. Pauses due to Sinus arrhythmia, and 2nd Degree AV Block. Artifact noted  Still waiting for MD reading for monitor result. Please advise

## 2017-10-02 NOTE — Telephone Encounter (Signed)
Spoke to patient, aware of echo results and aware that once monitor results are reviewed I will call with results and recommendations.    Also request to have dentist office fax our office a cardiac clearance request for review.  Patient aware and verbalized understanding.

## 2017-10-07 NOTE — Telephone Encounter (Signed)
Patient needs to follow up with cardiology for holter monitor results and whether it is safe to undergo dental surgery.

## 2017-10-09 NOTE — Telephone Encounter (Signed)
Pt informed. Erric Machnik T Neyla Gauntt, CMA  

## 2017-10-12 ENCOUNTER — Ambulatory Visit: Payer: Medicaid Other | Admitting: Family Medicine

## 2017-10-31 ENCOUNTER — Other Ambulatory Visit: Payer: Self-pay

## 2017-10-31 ENCOUNTER — Encounter (HOSPITAL_COMMUNITY): Payer: Self-pay

## 2017-10-31 NOTE — H&P (Signed)
HISTORY AND PHYSICAL  Huda Doroteo Glassmanhelps is a 36 y.o. female patient referred by general dentist for multiple dental extractions.  No diagnosis found.  Past Medical History:  Diagnosis Date  . Chronic back pain   . Chronic leg pain   . Diabetes mellitus without complication (HCC)   . Dizziness   . Hydradenitis   . Hyperlipidemia   . Legally blind   . Macular degeneration   . Pseudotumor cerebri syndrome 2005   shunt placed- and legally blind  . Sciatica     No current facility-administered medications for this encounter.    Current Outpatient Medications  Medication Sig Dispense Refill  . sulfamethoxazole-trimethoprim (BACTRIM DS,SEPTRA DS) 800-160 MG tablet Take 1 tablet by mouth 2 (two) times daily. Continuous for Hidradenitis suppurativa     No Known Allergies Active Problems:   * No active hospital problems. *  Vitals: There were no vitals taken for this visit. Lab results:No results found for this or any previous visit (from the past 24 hour(s)). Radiology Results: No results found. General appearance: alert, cooperative and moderately obese Head: Normocephalic, without obvious abnormality, atraumatic Eyes: negative Nose: Nares normal. Septum midline. Mucosa normal. No drainage or sinus tenderness. Throat: multiple carious teeth # 1, 7, 8, 9, 10, 23, 24, 25, 26. Pharynx clear no trismus. Neck: no adenopathy, supple, symmetrical, trachea midline and thyroid not enlarged, symmetric, no tenderness/mass/nodules Resp: clear to auscultation bilaterally Cardio: regular rate and rhythm, S1, S2 normal, no murmur, click, rub or gallop  Assessment: Nonrestorable teeth secondary to dental caries and periodontitis.  Plan: Multiple dental extractions, alveoloplasty. GA. Day surgery.   Ocie DoyneScott Deena Shaub 10/31/2017

## 2017-10-31 NOTE — Progress Notes (Signed)
PCP - Dr. Mosetta PuttFeng  Cardiologist - Dr. Doylene Canardalley  Chest x-ray - Denies  EKG - 08/30/17 (E)  Stress Test - Denies  ECHO - 09/17/17 (E)  Cardiac Cath - Denies  Sleep Study - No CPAP - None  LABS- 11/02/17 CBC,BMP, HA1C, HCG  HA1C- 12/7 Fasting Blood Sugar -  Checks Blood Sugar _1____ time a week  Pt denies having chest pain, sob, or fever during the phone interview. Pt advised to arrive to the Short Stay area at 7:30AM on 11/02/17. Pt advised to drink 1/2 cup of clear juice (apple or cranberry) if the BS is 70 and below. Pt also advised to notify the staff if the BS is above 220. All instructions explained to the pt, with a verbal understanding. The opportunity to ask questions was provided.

## 2017-11-01 ENCOUNTER — Telehealth: Payer: Self-pay | Admitting: Cardiovascular Disease

## 2017-11-01 MED ORDER — DEXTROSE 5 % IV SOLN
3.0000 g | INTRAVENOUS | Status: AC
  Administered 2017-11-02: 3 g via INTRAVENOUS
  Filled 2017-11-01: qty 3

## 2017-11-01 NOTE — Telephone Encounter (Signed)
   [facility] Pre-operative Risk Assessment    Request for surgical clearance:  1. What type of surgery is being performed? Multiple extraction with alveoloplasty   2. When is this surgery scheduled? 12/7   3. Are there any medications that need to be held prior to surgery and how long?NA   4. Practice name and name of physician performing surgery? Dr. Stefanie Libel    5. What is your office phone and fax number? P- [phone], Aldona Bar states they can see the Epic note, therefore do not need it faxed once reviewed.  6. Anesthesia type (None, local, MAC, general) ? NA   Tishia Maestre A Rechy Bost 11/01/2017, 2:12 PM  _________________________________________________________________   (provider comments below)

## 2017-11-01 NOTE — Telephone Encounter (Signed)
The Holter monitor was read The results should be in Epic.  The patient had predominant normal sinus rhythm with some episodes of sinus bradycardia and tachycardia.  There were 3 episodes of causes of 2.04, 2.08, and 2.36 seconds with a second-degree block.  There was an episode of Wenkebach second-degree block

## 2017-11-01 NOTE — Telephone Encounter (Signed)
Lelon MastSamantha made aware that clearance is in Epic.   Verbalized understanding

## 2017-11-01 NOTE — Progress Notes (Signed)
Anesthesia Chart Review:  Pt is a same day work up.   Pt is a 36 year old female scheduled for multiple extraction with alveoloplast on 11/02/2017 with [name], DDS  - [facility]ren [facility]  - Saw cardiologist [name], MD 08/30/17 for dizziness. Echo and Holter monitor ordered, results below. Dr. [name] cleared pt for surgery noting "in light of her recent Holter monitor, recommend intraoperative cardiac monitoring and postoperative telemetry while patient in recovery"  PMH includes:  DM, hyperlipidemia, pseudotumor cerebri syndrome, hidradenitis. Legally blind. Current smoker. BMI 42.5.  Medications reviewed  Labs will be obtained day of surgery.  - HbA1c was 5.4 on 07/26/17  Holter monitor 09/17/17: - The predominant rhythm is sinus rhythm with an average heart rate at 88 bpm.   - There were episodes of sinus bradycardia to a minimum of 41 and sinus tachycardia to a maximum of 144 bpm.   - There was an episode of type I Wenkebach second-degree AV block.   - There were 3 episodes of second-degree block with a ventricular pause over 2.0 seconds lasting 2.08, 2.04, and 2.36 seconds. - There were isolated PVCs with 7 bigeminal beats and some PACs with 2 atrial couplets.  The patient is not on any negative chronotropic medications.  Echo 09/17/17:  - Left ventricle: The cavity size was normal. Systolic function was normal. The estimated ejection fraction was in the range of 60% to 65%. Wall motion was normal; there were no regional wall motion abnormalities. Left ventricular diastolic function parameters were normal. - Aortic valve: Trileaflet; normal thickness leaflets. There was no regurgitation. - Aortic root: The aortic root was normal in size. - Mitral valve: There was mild regurgitation. - Right ventricle: The cavity size was normal. Wall thickness was normal. Systolic function was normal. - Tricuspid valve: There was mild regurgitation. -  Pulmonic valve: There was no regurgitation. - Pulmonary arteries: Systolic pressure was within the normal range. - Inferior vena cava: The vessel was normal in size. - Pericardium, extracardiac: There was no pericardial effusion. - Impressions: Mild mitral and tricuspid regurgitation, otherwise normal study.  If labs acceptable day of surgery, I anticipate pt can proceed as scheduled.   Monica Mastngela Raudel Bazen, FNP-BC [facility]hesiology Phone: [phone] 11/01/2017 2:48 PM

## 2017-11-01 NOTE — Telephone Encounter (Signed)
New message   Monica Huang verbalized that she is calling for the rn to see if the Holter monitor was read or not by provider

## 2017-11-01 NOTE — Telephone Encounter (Signed)
Spoke with samantha, the patient is scheduled for surgery tomorrow and they need the monitor read by 2 pm today to prevent the patient from being canceled. Will make dr [name] aware

## 2017-11-01 NOTE — Telephone Encounter (Signed)
Okay for surgery, but in light of her recent Holter monitor, recommend intraoperative cardiac monitoring and postoperative telemetry while patient in recovery.

## 2017-11-02 ENCOUNTER — Encounter (HOSPITAL_COMMUNITY): Payer: Self-pay | Admitting: Certified Registered"

## 2017-11-02 ENCOUNTER — Ambulatory Visit (HOSPITAL_COMMUNITY): Payer: Medicaid Other | Admitting: Emergency Medicine

## 2017-11-02 ENCOUNTER — Encounter (HOSPITAL_COMMUNITY)
Admission: RE | Disposition: A | Discharge status: Home / Self Care | Payer: Self-pay | Source: Ambulatory Visit | Attending: Oral Surgery

## 2017-11-02 ENCOUNTER — Ambulatory Visit (HOSPITAL_COMMUNITY)
Admission: RE | Admit: 2017-11-02 | Discharge: 2017-11-02 | Disposition: A | Discharge status: Home / Self Care | Payer: Medicaid Other | Source: Ambulatory Visit | Attending: Oral Surgery | Admitting: Oral Surgery

## 2017-11-02 DIAGNOSIS — K029 Dental caries, unspecified: Secondary | ICD-10-CM | POA: Insufficient documentation

## 2017-11-02 DIAGNOSIS — K0530 Chronic periodontitis, unspecified: Secondary | ICD-10-CM | POA: Diagnosis not present

## 2017-11-02 DIAGNOSIS — H548 Legal blindness, as defined in USA: Secondary | ICD-10-CM | POA: Insufficient documentation

## 2017-11-02 DIAGNOSIS — K0889 Other specified disorders of teeth and supporting structures: Secondary | ICD-10-CM | POA: Diagnosis present

## 2017-11-02 DIAGNOSIS — F17200 Nicotine dependence, unspecified, uncomplicated: Secondary | ICD-10-CM | POA: Insufficient documentation

## 2017-11-02 DIAGNOSIS — E119 Type 2 diabetes mellitus without complications: Secondary | ICD-10-CM | POA: Insufficient documentation

## 2017-11-02 DIAGNOSIS — Z6841 Body Mass Index (BMI) 40.0 and over, adult: Secondary | ICD-10-CM | POA: Diagnosis not present

## 2017-11-02 HISTORY — PX: MULTIPLE EXTRACTIONS WITH ALVEOLOPLASTY: SHX5342

## 2017-11-02 LAB — BASIC METABOLIC PANEL
Anion gap: 9 (ref 5–15)
BUN: 8 mg/dL (ref 6–20)
CHLORIDE: 106 mmol/L (ref 101–111)
CO2: 22 mmol/L (ref 22–32)
CREATININE: 0.9 mg/dL (ref 0.44–1.00)
Calcium: 8.6 mg/dL — ABNORMAL LOW (ref 8.9–10.3)
GFR calc Af Amer: >60 mL/min (ref >60)
GFR calc non Af Amer: >60 mL/min (ref >60)
Glucose, Bld: 81 mg/dL (ref 65–99)
Potassium: 4.5 mmol/L (ref 3.5–5.1)
Sodium: 137 mmol/L (ref 135–145)

## 2017-11-02 LAB — CBC
HCT: 37 % (ref 36.0–46.0)
Hemoglobin: 12.4 g/dL (ref 12.0–15.0)
MCH: 32.6 pg (ref 26.0–34.0)
MCHC: 33.5 g/dL (ref 30.0–36.0)
MCV: 97.4 fL (ref 78.0–100.0)
PLATELETS: 323 10*3/uL (ref 150–400)
RBC: 3.8 MIL/uL — ABNORMAL LOW (ref 3.87–5.11)
RDW: 13.2 % (ref 11.5–15.5)
WBC: 6 10*3/uL (ref 4.0–10.5)

## 2017-11-02 LAB — GLUCOSE, CAPILLARY
GLUCOSE-CAPILLARY: 73 mg/dL (ref 65–99)
GLUCOSE-CAPILLARY: 100 mg/dL — AB (ref 65–99)
Glucose-Capillary: 81 mg/dL (ref 65–99)
Glucose-Capillary: 75 mg/dL (ref 65–99)

## 2017-11-02 LAB — HCG, SERUM, QUALITATIVE: Preg, Serum: NEGATIVE

## 2017-11-02 SURGERY — MULTIPLE EXTRACTION WITH ALVEOLOPLASTY
Anesthesia: General | Laterality: Bilateral

## 2017-11-02 MED ORDER — FENTANYL CITRATE (PF) 250 MCG/5ML IJ SOLN
INTRAMUSCULAR | Status: AC
  Filled 2017-11-02: qty 5

## 2017-11-02 MED ORDER — SODIUM CHLORIDE 0.9 % IR SOLN
Status: DC | PRN
  Administered 2017-11-02: 1000 mL

## 2017-11-02 MED ORDER — ONDANSETRON HCL 4 MG/2ML IJ SOLN
INTRAMUSCULAR | Status: DC | PRN
  Administered 2017-11-02: 4 mg via INTRAVENOUS

## 2017-11-02 MED ORDER — FENTANYL CITRATE (PF) 100 MCG/2ML IJ SOLN
INTRAMUSCULAR | Status: DC | PRN
  Administered 2017-11-02 (×2): 50 ug via INTRAVENOUS
  Administered 2017-11-02: 100 ug via INTRAVENOUS

## 2017-11-02 MED ORDER — HYDROCODONE-ACETAMINOPHEN 5-325 MG PO TABS: 1.0000 | ORAL_TABLET | Freq: Four times a day (QID) | ORAL | 0 refills | Status: DC | PRN

## 2017-11-02 MED ORDER — 0.9 % SODIUM CHLORIDE (POUR BTL) OPTIME
TOPICAL | Status: DC | PRN
  Administered 2017-11-02: 1000 mL

## 2017-11-02 MED ORDER — MIDAZOLAM HCL 5 MG/5ML IJ SOLN
INTRAMUSCULAR | Status: DC | PRN
  Administered 2017-11-02: 2 mg via INTRAVENOUS

## 2017-11-02 MED ORDER — LIDOCAINE-EPINEPHRINE 2 %-1:100000 IJ SOLN
INTRAMUSCULAR | Status: AC
  Filled 2017-11-02: qty 1

## 2017-11-02 MED ORDER — SUCCINYLCHOLINE CHLORIDE 200 MG/10ML IV SOSY
PREFILLED_SYRINGE | INTRAVENOUS | Status: DC | PRN
  Administered 2017-11-02: 100 mg via INTRAVENOUS

## 2017-11-02 MED ORDER — LIDOCAINE 2% (20 MG/ML) 5 ML SYRINGE
INTRAMUSCULAR | Status: DC | PRN
  Administered 2017-11-02: 60 mg via INTRAVENOUS

## 2017-11-02 MED ORDER — OXYMETAZOLINE HCL 0.05 % NA SOLN
NASAL | Status: AC
  Filled 2017-11-02: qty 15

## 2017-11-02 MED ORDER — FENTANYL CITRATE (PF) 100 MCG/2ML IJ SOLN: 25.0000 ug | INTRAMUSCULAR | Status: DC | PRN

## 2017-11-02 MED ORDER — MIDAZOLAM HCL 2 MG/2ML IJ SOLN
INTRAMUSCULAR | Status: AC
  Filled 2017-11-02: qty 2

## 2017-11-02 MED ORDER — MEPERIDINE HCL 25 MG/ML IJ SOLN: 6.2500 mg | INTRAMUSCULAR | Status: DC | PRN

## 2017-11-02 MED ORDER — LACTATED RINGERS IV SOLN
INTRAVENOUS | Status: DC
  Administered 2017-11-02: 08:00:00 via INTRAVENOUS

## 2017-11-02 MED ORDER — DEXAMETHASONE SODIUM PHOSPHATE 10 MG/ML IJ SOLN
INTRAMUSCULAR | Status: DC | PRN
  Administered 2017-11-02: 5 mg via INTRAVENOUS

## 2017-11-02 MED ORDER — LIDOCAINE-EPINEPHRINE 2 %-1:100000 IJ SOLN
INTRAMUSCULAR | Status: DC | PRN
  Administered 2017-11-02: 14 mL via INTRADERMAL

## 2017-11-02 MED ORDER — ONDANSETRON HCL 4 MG/2ML IJ SOLN: 4.0000 mg | Freq: Once | INTRAMUSCULAR | Status: DC | PRN

## 2017-11-02 MED ORDER — PROPOFOL 10 MG/ML IV BOLUS
INTRAVENOUS | Status: DC | PRN
  Administered 2017-11-02: 150 mg via INTRAVENOUS
  Administered 2017-11-02: 50 mg via INTRAVENOUS

## 2017-11-02 SURGICAL SUPPLY — 34 items
BUR CROSS CUT FISSURE 1.6 (BURR) ×2 IMPLANT
BUR CROSS CUT FISSURE 1.6MM (BURR) ×1
BUR EGG ELITE 4.0 (BURR) ×2 IMPLANT
BUR EGG ELITE 4.0MM (BURR) ×1
CANISTER SUCT 3000ML PPV (MISCELLANEOUS) ×3 IMPLANT
CLOSURE WOUND 1/2 X4 (GAUZE/BANDAGES/DRESSINGS) ×1
COVER SURGICAL LIGHT HANDLE (MISCELLANEOUS) ×3 IMPLANT
CRADLE DONUT ADULT HEAD (MISCELLANEOUS) ×3 IMPLANT
DECANTER SPIKE VIAL GLASS SM (MISCELLANEOUS) ×3 IMPLANT
DRAPE U-SHAPE 76X120 STRL (DRAPES) ×3 IMPLANT
FLUID NSS /IRRIG 1000 ML XXX (MISCELLANEOUS) ×3 IMPLANT
GAUZE PACKING FOLDED 2  STR (GAUZE/BANDAGES/DRESSINGS) ×2
GAUZE PACKING FOLDED 2 STR (GAUZE/BANDAGES/DRESSINGS) ×1 IMPLANT
GLOVE BIO SURGEON STRL SZ 6.5 (GLOVE) ×2 IMPLANT
GLOVE BIO SURGEON STRL SZ7.5 (GLOVE) ×3 IMPLANT
GLOVE BIO SURGEONS STRL SZ 6.5 (GLOVE) ×1
GLOVE BIOGEL PI IND STRL 7.0 (GLOVE) IMPLANT
GLOVE BIOGEL PI INDICATOR 7.0 (GLOVE)
GOWN STRL REUS W/ TWL LRG LVL3 (GOWN DISPOSABLE) ×1 IMPLANT
GOWN STRL REUS W/ TWL XL LVL3 (GOWN DISPOSABLE) ×1 IMPLANT
GOWN STRL REUS W/TWL LRG LVL3 (GOWN DISPOSABLE) ×2
GOWN STRL REUS W/TWL XL LVL3 (GOWN DISPOSABLE) ×2
KIT BASIN OR (CUSTOM PROCEDURE TRAY) ×3 IMPLANT
KIT ROOM TURNOVER OR (KITS) ×3 IMPLANT
NEEDLE 22X1 1/2 (OR ONLY) (NEEDLE) ×6 IMPLANT
NEEDLE 27GAX1X1/2 (NEEDLE) IMPLANT
NS IRRIG 1000ML POUR BTL (IV SOLUTION) ×3 IMPLANT
PAD ARMBOARD 7.5X6 YLW CONV (MISCELLANEOUS) ×3 IMPLANT
STRIP CLOSURE SKIN 1/2X4 (GAUZE/BANDAGES/DRESSINGS) ×2 IMPLANT
SUT CHROMIC 3 0 PS 2 (SUTURE) ×6 IMPLANT
SYR CONTROL 10ML LL (SYRINGE) ×3 IMPLANT
TRAY ENT MC OR (CUSTOM PROCEDURE TRAY) ×3 IMPLANT
TUBING IRRIGATION (MISCELLANEOUS) ×3 IMPLANT
YANKAUER SUCT BULB TIP NO VENT (SUCTIONS) ×3 IMPLANT

## 2017-11-02 NOTE — Op Note (Signed)
11/02/2017  10:57 AM  PATIENT:  Katherina Miresrystal Smeal  36 y.o. female  PRE-OPERATIVE DIAGNOSIS:  NON RESTORABLE TEETH # 1, 7, 8, 9, 10, 23, 24, 25, 26, CHRONIC  PERIODONTITIS  POST-OPERATIVE DIAGNOSIS:  SAME  PROCEDURE:  Procedure(s): MULTIPLE EXTRACTION TEETH 1, 7, 8, 9, 10, 23, 24, 25, 26,   SURGEON:  Surgeon(s): Ocie DoyneJensen, Armina Galloway, DDS  ANESTHESIA:   local and general  EBL:  minimal  DRAINS: none   SPECIMEN:  No Specimen  COUNTS:  YES  PLAN OF CARE: Discharge to home after PACU  PATIENT DISPOSITION:  PACU - hemodynamically stable.   PROCEDURE DETAILS: Dictation # [id number]  Georgia LopesScott M. Anterio Scheel, DMD 11/02/2017 10:57 AM

## 2017-11-02 NOTE — Transfer of Care (Signed)
Immediate Anesthesia Transfer of Care Note  Patient: Monica Huang  Procedure(s) Performed: MULTIPLE EXTRACTION (Bilateral )  Patient Location: PACU  Anesthesia Type:General  Level of Consciousness: awake, alert  and oriented  Airway & Oxygen Therapy: Patient Spontanous Breathing and Patient connected to nasal cannula oxygen  Post-op Assessment: Report given to RN and Post -op Vital signs reviewed and stable  Post vital signs: Reviewed and stable  Last Vitals:  Vitals:   11/02/17 0742  BP: 114/71  Pulse: 61  Resp: 18  Temp: 36.6 C  SpO2: 99%    Last Pain:  Vitals:   11/02/17 0742  TempSrc: Oral      Patients Stated Pain Goal: 3 (11/02/17 0741)  Complications: No apparent anesthesia complications

## 2017-11-02 NOTE — Progress Notes (Signed)
CBG of 75 reported to Dr Tacy Duraddono, no order received.

## 2017-11-02 NOTE — Anesthesia Postprocedure Evaluation (Signed)
Anesthesia Post Note  Patient: OptometristCrystal Sahlin  Procedure(s) Performed: MULTIPLE EXTRACTION (Bilateral )     Patient location during evaluation: PACU Anesthesia Type: General Level of consciousness: awake and alert Pain management: pain level controlled Vital Signs Assessment: post-procedure vital signs reviewed and stable Respiratory status: spontaneous breathing, nonlabored ventilation, respiratory function stable and patient connected to nasal cannula oxygen Cardiovascular status: blood pressure returned to baseline and stable Postop Assessment: no apparent nausea or vomiting Anesthetic complications: no    Last Vitals:  Vitals:   11/02/17 1230 11/02/17 1245  BP: (!) 118/92 122/86  Pulse: 69 64  Resp:    Temp:  36.5 C  SpO2: 100% 100%    Last Pain:  Vitals:   11/02/17 1245  TempSrc:   PainSc: 0-No pain                 Borghild Thaker P Cornella Emmer

## 2017-11-02 NOTE — Anesthesia Procedure Notes (Signed)
Procedure Name: Intubation Date/Time: 11/02/2017 10:41 AM Performed by: Elliot DallyHuggins, Monquie Fulgham, CRNA Pre-anesthesia Checklist: Patient identified, Emergency Drugs available, Suction available and Patient being monitored Patient Re-evaluated:Patient Re-evaluated prior to induction Oxygen Delivery Method: Circle System Utilized Preoxygenation: Pre-oxygenation with 100% oxygen Induction Type: IV induction Ventilation: Mask ventilation without difficulty Laryngoscope Size: Miller and 2 Grade View: Grade I Nasal Tubes: Right, Nasal prep performed and Nasal Rae Tube size: 6.5 mm Number of attempts: 1 Airway Equipment and Method: Stylet and Oral airway Placement Confirmation: ETT inserted through vocal cords under direct vision,  positive ETCO2 and breath sounds checked- equal and bilateral Secured at: 25 cm Tube secured with: Tape Dental Injury: Teeth and Oropharynx as per pre-operative assessment

## 2017-11-02 NOTE — H&P (Signed)
H&P documentation  -History and Physical Reviewed  -Patient has been re-examined  -No change in the plan of care  Amrit Cress  

## 2017-11-02 NOTE — Anesthesia Preprocedure Evaluation (Signed)
Anesthesia Evaluation  Patient identified by MRN, date of birth, ID band Patient awake    Reviewed: Allergy & Precautions, NPO status , Patient's Chart, lab work & pertinent test results  Airway Mallampati: II  TM Distance: >3 FB Neck ROM: Full    Dental no notable dental hx.    Pulmonary neg pulmonary ROS, Current Smoker,    Pulmonary exam normal breath sounds clear to auscultation       Cardiovascular negative cardio ROS Normal cardiovascular exam Rhythm:Regular Rate:Normal     Neuro/Psych negative neurological ROS  negative psych ROS   GI/Hepatic negative GI ROS, Neg liver ROS,   Endo/Other  negative endocrine ROSdiabetes, Type 2Morbid obesity  Renal/GU negative Renal ROS  negative genitourinary   Musculoskeletal negative musculoskeletal ROS (+)   Abdominal   Peds negative pediatric ROS (+)  Hematology negative hematology ROS (+)   Anesthesia Other Findings Echo 11/18  Study Conclusions  - Left ventricle: The cavity size was normal. Systolic function was   normal. The estimated ejection fraction was in the range of 60%   to 65%. Wall motion was normal; there were no regional wall   motion abnormalities. Left ventricular diastolic function   parameters were normal. - Aortic valve: Trileaflet; normal thickness leaflets. There was no   regurgitation. - Aortic root: The aortic root was normal in size. - Mitral valve: There was mild regurgitation. - Right ventricle: The cavity size was normal. Wall thickness was   normal. Systolic function was normal. - Tricuspid valve: There was mild regurgitation. - Pulmonic valve: There was no regurgitation. - Pulmonary arteries: Systolic pressure was within the normal   range. - Inferior vena cava: The vessel was normal in size. - Pericardium, extracardiac: There was no pericardial effusion.  Impressions:  - Mild mitral and tricuspid regurgitation, otherwise  normal study.   Reproductive/Obstetrics negative OB ROS                             Anesthesia Physical Anesthesia Plan  ASA: III  Anesthesia Plan: General   Post-op Pain Management:    Induction: Intravenous  PONV Risk Score and Plan: 3 and Ondansetron, Treatment may vary due to age or medical condition and Midazolam  Airway Management Planned: Oral ETT  Additional Equipment:   Intra-op Plan:   Post-operative Plan: Extubation in OR  Informed Consent: I have reviewed the patients History and Physical, chart, labs and discussed the procedure including the risks, benefits and alternatives for the proposed anesthesia with the patient or authorized representative who has indicated his/her understanding and acceptance.     Plan Discussed with:   Anesthesia Plan Comments: (  )        Anesthesia Quick Evaluation

## 2017-11-03 ENCOUNTER — Encounter (HOSPITAL_COMMUNITY): Payer: Self-pay | Admitting: Oral Surgery

## 2017-11-04 NOTE — Op Note (Signed)
NAME:  Mawson, Keashia                   ACCOUNT NO.:  MEDICAL RECORD NO.:  [id number]  LOCATION:                                 FACILITY:  PHYSICIAN:  Georgia LopesScott M. Fraidy Mccarrick, M.D.       DATE OF BIRTH:  DATE OF PROCEDURE:  11/02/2017 DATE OF DISCHARGE:                              OPERATIVE REPORT   PREOPERATIVE DIAGNOSES:  Nonrestorable teeth numbers 1, 7, 8, 9, 10, 23, 24, 25, 26 and chronic periodontitis.  POSTOPERATIVE DIAGNOSES:  Nonrestorable teeth numbers 1, 7, 8, 9, 10, 23, 24, 25, 26 and chronic periodontitis.  PROCEDURE:  Extraction teeth numbers 1, 7, 8, 9, 10, 23, 24, 25, 26.  SURGEON:  Georgia LopesScott M. Lydell Moga, M.D.  ANESTHESIA:  General nasal intubation.  DESCRIPTION OF PROCEDURE:  The patient was taken to the operating room, placed on the table in supine position.  General anesthesia was administered and a nasal endotracheal tube was placed and secured.  The eyes were protected.  The patient was draped for surgery.  Time-out was performed.  The posterior pharynx was suctioned and a throat pack was placed.  A 2% lidocaine with 1:100,000 epinephrine was infiltrated in an inferior alveolar block on the right and left side and a buccal and palatal infiltration around teeth numbers 1, 7, 8, 9, 10; total of 14 mL was utilized.  A bite block was placed on the right side of the mouth and a sweetheart retractor was used to retract the tongue.  A 15 blade was used to make an incision around teeth numbers 23, 24, 25, 26.  The periosteum was reflected, and the teeth were elevated and removed with the Asch forceps.  The sockets were curetted and irrigated.  No suturing was necessary.  The 15 blade was then used to make a sulcular incision around teeth numbers 7, 8, 9, and 10.  The periosteum was reflected with a periosteal elevator, and the teeth were elevated with a 301 elevator and removed from the mouth with the upper #150 universal forceps.  The sockets were curetted, irrigated, and  no suturing was necessary.  Then, the bite-block was repositioned and a 15 blade was used to make an incision around tooth #1.  The periosteum was reflected with a periosteal elevator.  The tooth was elevated with a 301 elevator and removed from the mouth with the upper universal forceps.  The socket was curetted, irrigated, and no suturing was necessary.  Then, the oral cavity was inspected, irrigated, and suctioned.  The throat pack was removed.  The patient was left in care of anesthesia for awakening, extubation, transportation to recovery room with plans for discharge home.  ESTIMATED BLOOD LOSS:  Minimal.  COMPLICATIONS:  None.  SPECIMENS:  None.     Georgia LopesScott M. Ellianah Cordy, M.D.     SMJ/MEDQ  D:  11/02/2017  T:  11/02/2017  Job:  [id number]

## 2017-12-26 ENCOUNTER — Ambulatory Visit: Payer: Medicaid Other | Admitting: Student in an Organized Health Care Education/Training Program

## 2018-01-11 ENCOUNTER — Ambulatory Visit: Payer: Medicaid Other | Admitting: Student in an Organized Health Care Education/Training Program

## 2018-01-22 ENCOUNTER — Ambulatory Visit: Payer: Medicaid Other | Admitting: Student in an Organized Health Care Education/Training Program

## 2018-02-01 ENCOUNTER — Encounter: Payer: Self-pay | Admitting: Student in an Organized Health Care Education/Training Program

## 2018-02-01 ENCOUNTER — Other Ambulatory Visit: Payer: Self-pay

## 2018-02-01 ENCOUNTER — Ambulatory Visit: Payer: Medicaid Other | Admitting: Student in an Organized Health Care Education/Training Program

## 2018-02-01 DIAGNOSIS — M25561 Pain in right knee: Secondary | ICD-10-CM

## 2018-02-01 DIAGNOSIS — E119 Type 2 diabetes mellitus without complications: Secondary | ICD-10-CM

## 2018-02-01 DIAGNOSIS — L732 Hidradenitis suppurativa: Secondary | ICD-10-CM | POA: Diagnosis not present

## 2018-02-01 LAB — POCT GLYCOSYLATED HEMOGLOBIN (HGB A1C): Hemoglobin A1C: 5.5

## 2018-02-01 MED ORDER — SULFAMETHOXAZOLE-TRIMETHOPRIM 800-160 MG PO TABS: 1.0000 | ORAL_TABLET | Freq: Two times a day (BID) | ORAL | 0 refills | Status: DC

## 2018-02-01 MED ORDER — IBUPROFEN 600 MG PO TABS: 600.0000 mg | ORAL_TABLET | Freq: Three times a day (TID) | ORAL | 0 refills | Status: DC | PRN

## 2018-02-01 NOTE — Assessment & Plan Note (Signed)
Controlled with lifestyle. Last A1c was 5.4 when checked about 6 months ago. Last A1c over 6 was 08/2016. We can follow with A1c's every 6 months for patient's peace of mind as she is concerned this runs strongly in her family. - check A1c as patient leaves today, will give her results after the visit.

## 2018-02-01 NOTE — Patient Instructions (Signed)
It was a pleasure seeing you today in our clinic. Today we discussed diabetes, hydradenitis, and knee pain. Here is the treatment plan we have discussed and agreed upon together:  We drew blood work at today's visit. I will call or send you a letter with these results. If you do not hear from me within the next week, please give our office a call.  Our clinic's number is [phone]. Please call with questions or concerns about what we discussed today.  Be well, Dr. Mosetta PuttFeng

## 2018-02-01 NOTE — Progress Notes (Signed)
CC: R knee pain, Hidradenitis, T2DM  HPI: Monica Huang is a 37 y.o. female with PMH significant for blindness 2/2 pseudotumor cerebri, history of T2DM, and hidradenitis supportiva who presents to [facility] today for follow up of chronic issues.   Hydradenitis Patient reports that this is typically managed by dermatology, and she is on chronic Bactrim.  However she reports that she has been unable to get in with dermatology due to lack of transportation.  She reports that she has noticed hidradenitis lesions over her anterior chest wall and she feels that the odor suggests that they have been draining.  Been out of Bactrim for about 1 month.  She denies any fever, warmth or pain over the chest wall.  She reports that she anticipates having transportation this month, however asks if I can prescribe for her chronic Bactrim as a bridge to get her to that appointment.  Knee pain, Right Patient reports right knee pain that has been bothering her over the last week.  She reports increased activity with walking to work, and walking around on hard pavement at work.  She reports that pain is worse when she is going up and down stairs.  She was given 800 mg of Motrin by a colleague at work and noted improvement in the pain for a couple of days.  She denies any recent trauma, no falls, no fevers.  She denies warmth over the knee, however feels it may have been swollen.  T2DM Patient has a history of type 2 diabetes, diagnosed in 2015.  At that time she had an isolated elevated A1c of 13.4.  Subsequently she was able to bring down her A1c.  And she has been controlled with diet and exercise.  Her last A1c that was greater than 6 was back in October 2016.  Due to strong family history for diabetes she prefers to continue being tested every 6 months with serial A1c.  Her last A1c was completed in 06/2017 and this was 5.4.  She feels she may have gained weight recently, however is not experiencing polyuria polydipsia.   She is legally blind, however this is not a result of diabetes.  Review of Symptoms:  See HPI for ROS.   CC, SH/smoking status, and VS noted.  Objective: BP 118/74   Pulse 92   Temp 98 F (36.7 C) (Oral)   Ht 5\' 8"  (1.727 m)   Wt 285 lb 3.2 oz (129.4 kg)   LMP 01/21/2018 (Exact Date)   SpO2 99%   BMI 43.36 kg/m  GEN: NAD, alert, cooperative, and pleasant. EXT: R knee: no erythema, warmth or edema.  Right knee: Normal to inspection with no erythema or effusion or obvious bony abnormalities. Palpation normal with no warmth, joint line tenderness, or condyle tenderness. +mild patellar ttp ROM full in flexion and extension and lower leg rotation. Ligaments with solid consistent endpoints including ACL, PCL, LCL, MCL. Patellar glide without crepitus. Patellar and quadriceps tendons unremarkable. Hamstring and quadriceps strength is normal.  SKIN: +2-3 hidradenitis lesions over chest wall, none are draining, no surrounding warmth or erythema NEURO: II-XII grossly intact, normal gait, peripheral sensation intact PSYCH: AAOx3, appropriate affect  Assessment and plan:  Hidradenitis Hydradenitis over the chest noted on exam. Patient usually on maintenance Bactrim per derm. Refill bactrim while patient is getting an appointment with dermatology.   Diabetes type 2, controlled Controlled with lifestyle. Last A1c was 5.4 when checked about 6 months ago. Last A1c over 6 was 08/2016. We  can follow with A1c's every 6 months for patient's peace of mind as she is concerned this runs strongly in her family. - check A1c as patient leaves today, will give her results after the visit.  Knee pain, right Most consistent with acute anserine bursitis given recent increase in activity level. No evidence of infection on exam. No deformities on exam. - rest - heating pad - ibuprofen 600 mg TID PRN pain - red flags/reasons to return to care discussed - follow up in 3-4 weeks if symptoms fail to  resolve or worsen   Orders Placed This Encounter  Procedures  . HgB A1c    Meds ordered this encounter  Medications  . sulfamethoxazole-trimethoprim (BACTRIM DS,SEPTRA DS) 800-160 MG tablet    Sig: Take 1 tablet by mouth 2 (two) times daily. Continuous for Hidradenitis suppurativa    Dispense:  30 tablet    Refill:  0  . ibuprofen (ADVIL,MOTRIN) 600 MG tablet    Sig: Take 1 tablet (600 mg total) by mouth every 8 (eight) hours as needed.    Dispense:  20 tablet    Refill:  0     Howard PouchLauren Barrington Worley, MD,MS,  PGY2 02/01/2018 4:47 PM

## 2018-02-01 NOTE — Assessment & Plan Note (Addendum)
Most consistent with acute anserine bursitis given recent increase in activity level. No evidence of infection on exam. No deformities on exam. - rest - heating pad - ibuprofen 600 mg TID PRN pain - red flags/reasons to return to care discussed - follow up in 3-4 weeks if symptoms fail to resolve or worsen

## 2018-02-01 NOTE — Assessment & Plan Note (Signed)
Hydradenitis over the chest noted on exam. Patient usually on maintenance Bactrim per derm. Refill bactrim while patient is getting an appointment with dermatology.

## 2018-02-19 ENCOUNTER — Ambulatory Visit (INDEPENDENT_AMBULATORY_CARE_PROVIDER_SITE_OTHER): Payer: Medicaid Other | Admitting: Family Medicine

## 2018-02-19 VITALS — BP 140/80 | HR 93 | Temp 98.4°F

## 2018-02-19 DIAGNOSIS — L600 Ingrowing nail: Secondary | ICD-10-CM | POA: Diagnosis not present

## 2018-02-19 DIAGNOSIS — M7660 Achilles tendinitis, unspecified leg: Secondary | ICD-10-CM

## 2018-02-19 DIAGNOSIS — M6788 Other specified disorders of synovium and tendon, other site: Secondary | ICD-10-CM

## 2018-02-19 NOTE — Progress Notes (Signed)
    Subjective:    Patient ID: Monica Huang, female    DOB: 1980/12/26, 37 y.o.   MRN: [id number]   CC: right ankle pain  Bump on back of foot "by achilles tendon" that she noticed about 4-5 days ago. Walking and working has made it much worse in that time period. 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 She has been taking her usual ibuprofen 600 mg twice a day with little relief. She has not tried any ice or heat for this yet. Denies injury to area. Denies numbness/tingling in foot.   Smoking status reviewed- current smoker  Review of Systems- denies fevers, chills, wound on foot  Objective:  BP 140/80 (BP Location: Right Arm)   Pulse 93   Temp 98.4 F (36.9 C) (Oral)   LMP 01/21/2018 (Exact Date)   SpO2 100%  Vitals and nursing note reviewed  General: well nourished, in no acute distress Cardiac: RRR, clear S1 and S2, no murmurs, rubs, or gallops Respiratory: clear to auscultation bilaterally, no increased work of breathing Extremities: no edema or cyanosis. Posterior right ankle with tender protusion over distal end of achilles tendon. Appears erythematous compared to left. No swelling appreciated. Neurovascularly in tact distally Skin: warm and dry, no rashes noted Neuro: alert and oriented, no focal deficits   Assessment & Plan:    1. Achilles tendinosis Encouraged continued ibuprofen with increase in frequency to q8 hours instead of q12. Add ice. No heel lift available in clinic. Will refer to sports med for further eval, possibly needs nitro patches to area. She may also benefit from PT. Gave information about stretches to area. Discussed she needs to stop the repetitive motion at work, try other foot on sewing machine or take a break. Work note given. - Ambulatory referral to Sports Medicine  2. Ingrown toenail Patient would like to return to see foot doctor for feet care.  - Ambulatory referral to Podiatry  Return if symptoms worsen  or fail to improve.   Dolores Patty, DO Family Medicine Resident PGY-2

## 2018-02-19 NOTE — Patient Instructions (Signed)

## 2018-02-20 ENCOUNTER — Encounter: Payer: Self-pay | Admitting: Family Medicine

## 2018-02-20 ENCOUNTER — Ambulatory Visit: Payer: Medicaid Other | Admitting: Family Medicine

## 2018-02-20 DIAGNOSIS — M7661 Achilles tendinitis, right leg: Secondary | ICD-10-CM | POA: Diagnosis present

## 2018-02-20 NOTE — Progress Notes (Signed)
PCP and consultation requested by: Howard Pouch, MD  Subjective:   HPI: Patient is a 37 y.o. female here for right heel pain.  Patient reports she's had posterior right heel pain for about 1 week now. No acute injury or trauma. Pain level 7/10 and sharp. 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 Woke up with this and was really bad on Thursday. Has tried ibuprofen 600 tid with some benefit. No skin changes, numbness. Had a lot of swelling but this has improved.  Past Medical History:  Diagnosis Date  . Chronic back pain   . Chronic leg pain   . Diabetes mellitus without complication (HCC)    Type II  . Dizziness   . Hydradenitis   . Hyperlipidemia   . Legally blind   . Macular degeneration   . Pseudotumor cerebri syndrome 2005   shunt placed- and legally blind  . Sciatica     Current Outpatient Medications on File Prior to Visit  Medication Sig Dispense Refill  . HYDROcodone-acetaminophen (NORCO) 5-325 MG tablet Take 1-2 tablets by mouth every 6 (six) hours as needed for moderate pain. 30 tablet 0  . ibuprofen (ADVIL,MOTRIN) 600 MG tablet Take 1 tablet (600 mg total) by mouth every 8 (eight) hours as needed. 20 tablet 0  . sulfamethoxazole-trimethoprim (BACTRIM DS,SEPTRA DS) 800-160 MG tablet Take 1 tablet by mouth 2 (two) times daily. Continuous for Hidradenitis suppurativa 30 tablet 0   No current facility-administered medications on file prior to visit.     Past Surgical History:  Procedure Laterality Date  . CSF SHUNT     2 revisions  . MULTIPLE EXTRACTIONS WITH ALVEOLOPLASTY Bilateral 11/02/2017   Procedure: MULTIPLE EXTRACTION;  Surgeon: Ocie Doyne, DDS;  Location: [facility] OR;  Service: Oral Surgery;  Laterality: Bilateral;    No Known Allergies  Social History   Socioeconomic History  . Marital status: Single    Spouse name: Not on file  . Number of children: Not on file  . Years of education: Not on file  . Highest education level: Not on file   Occupational History  . Not on file  Social Needs  . Financial resource strain: Not on file  . Food insecurity:    Worry: Not on file    Inability: Not on file  . Transportation needs:    Medical: Not on file    Non-medical: Not on file  Tobacco Use  . Smoking status: Current Every Day Smoker    Packs/day: 0.50    Types: Cigarettes  . Smokeless tobacco: Never Used  . Tobacco comment: in process of quitting  Substance and Sexual Activity  . Alcohol use: No  . Drug use: No    Comment: marijuana -- bag per day.   Marland Kitchen Sexual activity: Yes    Birth control/protection: None    Comment: 1 partners  Lifestyle  . Physical activity:    Days per week: Not on file    Minutes per session: Not on file  . Stress: Not on file  Relationships  . Social connections:    Talks on phone: Not on file    Gets together: Not on file    Attends religious service: Not on file    Active member of club or organization: Not on file    Attends meetings of clubs or organizations: Not on file    Relationship status: Not on file  . Intimate partner violence:    Fear of current or ex partner: Not on  file    Emotionally abused: Not on file    Physically abused: Not on file    Forced sexual activity: Not on file  Other Topics Concern  . Not on file  Social History Narrative   On disability since 2005 due to vision loss from psuedotumor cerebri.   Did not finish highschool-- completed the 9th grade.    Walking 2 x week for 45min.           Family History  Problem Relation Age of Onset  . Diabetes Mother   . Hypertension Mother   . Diabetes Father     BP 105/68   Ht 5\' 8"  (1.727 m)   Wt 280 lb (127 kg)   LMP 01/21/2018 (Exact Date)   BMI 42.57 kg/m   Review of Systems: See HPI above.     Objective:  Physical Exam:  Gen: NAD, comfortable in exam room  Right foot/ankle: Small haglund's deformity.  No other gross deformity, swelling, ecchymoses FROM with 5/5 strength all directions  but pain with dorsiflexion and plantarflexion. TTP insertion of achilles on calcaneus.  No other tenderness. Negative ant drawer and talar tilt.   Negative calcaneal squeeze. Negative syndesmotic compression. Thompsons test negative. NV intact distally.  Left foot/ankle: No deformity. FROM with 5/5 strength. No tenderness to palpation. NVI distally.  Assessment & Plan:  1. Right insertional achilles tendinopathy - Icing, ibuprofen or aleve.  Heel lifts.  Shown home exercises to do as she improves and how to advance these.  Avoid uneven ground, hills.  F/u in 1 month.

## 2018-02-20 NOTE — Patient Instructions (Signed)
You have insertional achilles tendinitis. Ibuprofen 600mg  three times a day with food OR aleve 2 tabs twice a day with food for pain and inflammation. When you feel able (probably in about a week you can start the home exercises). Calf raises 3 sets of 10 on level ground once a day first. When these are easy, can do them one legged 3 sets of 10. Finally advance to doing them on a step. Can add heel walks, toe walks forward and backward as well Ice bucket 10-15 minutes at end of day - can ice 3-4 times a day. Avoid uneven ground, hills as much as possible. Heel lifts in shoes or shoes with a natural heel lift when up and walking around. Consider physical therapy, orthotics, nitro patches if not improving as expected. Follow up in 1 month.

## 2018-02-20 NOTE — Assessment & Plan Note (Signed)
Right insertional achilles tendinopathy - Icing, ibuprofen or aleve.  Heel lifts.  Shown home exercises to do as she improves and how to advance these.  Avoid uneven ground, hills.  F/u in 1 month.

## 2018-02-21 ENCOUNTER — Telehealth: Payer: Self-pay | Admitting: Hematology

## 2018-02-21 NOTE — Telephone Encounter (Signed)
02/21/2018 @ 2:35 pm, spoke with patient informing FMLA was successfully faxed to [employer] at [phone]. Patient requested to pick up a copy from front desk at cancer center next week. Form has been taken to Ms. Wilma.

## 2018-03-13 ENCOUNTER — Other Ambulatory Visit: Payer: Self-pay

## 2018-03-14 ENCOUNTER — Other Ambulatory Visit: Payer: Self-pay

## 2018-03-14 ENCOUNTER — Encounter: Payer: Self-pay | Admitting: Podiatry

## 2018-03-14 ENCOUNTER — Ambulatory Visit: Payer: Medicaid Other | Admitting: Podiatry

## 2018-03-14 DIAGNOSIS — L600 Ingrowing nail: Secondary | ICD-10-CM | POA: Diagnosis not present

## 2018-03-14 DIAGNOSIS — E1140 Type 2 diabetes mellitus with diabetic neuropathy, unspecified: Secondary | ICD-10-CM | POA: Diagnosis not present

## 2018-03-14 DIAGNOSIS — E1149 Type 2 diabetes mellitus with other diabetic neurological complication: Secondary | ICD-10-CM | POA: Diagnosis not present

## 2018-03-14 NOTE — Telephone Encounter (Signed)
Error, chantix has been denied by provider and no longer on med list.

## 2018-03-17 NOTE — Progress Notes (Signed)
Subjective:   Patient ID: Monica Huang, female   DOB: 37 y.o.   MRN: [id number]   HPI Obese female with long-term diabetic neuropathy-like changes with thick yellow brittle nailbeds and ingrown toenails in the big toes with pain across all nailbeds and sensitivity.  Patient does not smoke and likes to be active but cannot due to obesity and pain   Review of Systems  All other systems reviewed and are negative.       Objective:  Physical Exam  Constitutional: She appears well-developed and well-nourished.  Cardiovascular: Intact distal pulses.  Pulmonary/Chest: Effort normal.  Musculoskeletal: Normal range of motion.  Neurological: She is alert.  Skin: Skin is warm.  Nursing note and vitals reviewed.   Neurovascular status is diminished with diminished pulses noted but present and diminished sharp dull vibratory bilateral.  Patient is found to have thickened nailbeds 1-5 both feet that all get dystrophic and painful when palpated with incurvation of the hallux nails bilateral that are painful when palpated and make it hard for her to wear shoe gear     Assessment:  At risk diabetic with mycotic nail infection and incurvation of nail beds     Plan:  Discussed the importance of daily inspections especially by caregiver and debrided nailbeds today 1-5 with no iatrogenic bleeding and took out the corners of the big toes with no permanent procedure done today but possible in future

## 2018-03-20 ENCOUNTER — Encounter: Payer: Self-pay | Admitting: Family Medicine

## 2018-03-20 ENCOUNTER — Ambulatory Visit: Payer: Medicaid Other | Admitting: Family Medicine

## 2018-03-20 ENCOUNTER — Other Ambulatory Visit: Payer: Self-pay | Admitting: Student in an Organized Health Care Education/Training Program

## 2018-03-20 DIAGNOSIS — L732 Hidradenitis suppurativa: Secondary | ICD-10-CM

## 2018-03-20 DIAGNOSIS — M7661 Achilles tendinitis, right leg: Secondary | ICD-10-CM | POA: Diagnosis present

## 2018-03-20 MED ORDER — NITROGLYCERIN 0.2 MG/HR TD PT24: MEDICATED_PATCH | TRANSDERMAL | 1 refills | Status: DC

## 2018-03-20 NOTE — Patient Instructions (Signed)
Continue using the heel lifts. Advance your strengthening - use the red theraband and once a day do 3 sets of 10 exercise as shown. When this is easy you can start 2 legged calf raises that I showed you once a day 3 sets of 10. Start nitro patches - 1/4th patch to affected achilles, change daily. Consider orthotics, physical therapy, increased nitro dosage if not improving. Follow up in 6 weeks.

## 2018-03-24 ENCOUNTER — Encounter: Payer: Self-pay | Admitting: Family Medicine

## 2018-03-24 NOTE — Assessment & Plan Note (Signed)
continue with heel lifts.  Advance strengthening to red theraband an discussed heel raises when able to do these.  Start nitro patches (discussed risks of headache, skin irritation).  Consider orthotics, physical therapy, increased nitro dosage if not improving.  F/u in 6 weeks.

## 2018-03-24 NOTE — Progress Notes (Signed)
PCP and consultation requested by: Howard Pouch, MD  Subjective:   HPI: Patient is a 37 y.o. female here for right heel pain.  3/27: Patient reports she's had posterior right heel pain for about 1 week now. No acute injury or trauma. Pain level 7/10 and sharp. 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 Woke up with this and was really bad on Thursday. Has tried ibuprofen 600 tid with some benefit. No skin changes, numbness. Had a lot of swelling but this has improved.  4/24: Patient reports she hasn't noticed much improvement compared to last visit. Pain will come and go posterior right heel. Worse getting up after prolonged sitting. Not taking any medicine for this. Using heel lifts, doing some of the ankle exercises but primarily motion. No skin changes.  Past Medical History:  Diagnosis Date  . Chronic back pain   . Chronic leg pain   . Diabetes mellitus without complication (HCC)    Type II  . Dizziness   . Hydradenitis   . Hyperlipidemia   . Legally blind   . Macular degeneration   . Pseudotumor cerebri syndrome 2005   shunt placed- and legally blind  . Sciatica     Current Outpatient Medications on File Prior to Visit  Medication Sig Dispense Refill  . HYDROcodone-acetaminophen (NORCO) 5-325 MG tablet Take 1-2 tablets by mouth every 6 (six) hours as needed for moderate pain. (Patient not taking: Reported on 03/14/2018) 30 tablet 0  . ibuprofen (ADVIL,MOTRIN) 600 MG tablet Take 1 tablet (600 mg total) by mouth every 8 (eight) hours as needed. 20 tablet 0   No current facility-administered medications on file prior to visit.     Past Surgical History:  Procedure Laterality Date  . CSF SHUNT     2 revisions  . MULTIPLE EXTRACTIONS WITH ALVEOLOPLASTY Bilateral 11/02/2017   Procedure: MULTIPLE EXTRACTION;  Surgeon: Ocie Doyne, DDS;  Location: [facility] OR;  Service: Oral Surgery;  Laterality: Bilateral;    No Known Allergies  Social History    Socioeconomic History  . Marital status: Single    Spouse name: Not on file  . Number of children: Not on file  . Years of education: Not on file  . Highest education level: Not on file  Occupational History  . Not on file  Social Needs  . Financial resource strain: Not on file  . Food insecurity:    Worry: Not on file    Inability: Not on file  . Transportation needs:    Medical: Not on file    Non-medical: Not on file  Tobacco Use  . Smoking status: Current Every Day Smoker    Packs/day: 0.50    Types: Cigarettes  . Smokeless tobacco: Never Used  . Tobacco comment: in process of quitting  Substance and Sexual Activity  . Alcohol use: No  . Drug use: No    Comment: marijuana -- bag per day.   Marland Kitchen Sexual activity: Yes    Birth control/protection: None    Comment: 1 partners  Lifestyle  . Physical activity:    Days per week: Not on file    Minutes per session: Not on file  . Stress: Not on file  Relationships  . Social connections:    Talks on phone: Not on file    Gets together: Not on file    Attends religious service: Not on file    Active member of club or organization: Not on file    Attends meetings of  clubs or organizations: Not on file    Relationship status: Not on file  . Intimate partner violence:    Fear of current or ex partner: Not on file    Emotionally abused: Not on file    Physically abused: Not on file    Forced sexual activity: Not on file  Other Topics Concern  . Not on file  Social History Narrative   On disability since 2005 due to vision loss from psuedotumor cerebri.   Did not finish highschool-- completed the 9th grade.    Walking 2 x week for .           Family History  Problem Relation Age of Onset  . Diabetes Mother   . Hypertension Mother   . Diabetes Father     BP 126/84   Ht  (1.727 m)   Wt 280 lb (127 kg)   BMI 42.57 kg/m   Review of Systems: See HPI above.     Objective:  Physical Exam:  Gen: NAD,  comfortable in exam room  Right foot/ankle: Small haglund's deformity.  No other gross deformity, swelling, ecchymoses FROM with 5/5 strength, pain on plantarflexion and dorsiflexion. TTP insertion of achilles on calcaneus.  No other tenderness. Negative ant drawer and talar tilt.   Negative calcaneal squeeze. Thompsons test negative. NV intact distally.  Assessment & Plan:  1. Right insertional achilles tendinopathy - continue with heel lifts.  Advance strengthening to red theraband an discussed heel raises when able to do these.  Start nitro patches (discussed risks of headache, skin irritation).  Consider orthotics, physical therapy, increased nitro dosage if not improving.  F/u in 6 weeks.

## 2018-04-05 ENCOUNTER — Ambulatory Visit (INDEPENDENT_AMBULATORY_CARE_PROVIDER_SITE_OTHER): Payer: Medicaid Other | Admitting: Student in an Organized Health Care Education/Training Program

## 2018-04-05 ENCOUNTER — Encounter: Payer: Self-pay | Admitting: Student in an Organized Health Care Education/Training Program

## 2018-04-05 ENCOUNTER — Other Ambulatory Visit: Payer: Self-pay

## 2018-04-05 DIAGNOSIS — Z720 Tobacco use: Secondary | ICD-10-CM

## 2018-04-05 DIAGNOSIS — M25512 Pain in left shoulder: Secondary | ICD-10-CM | POA: Diagnosis not present

## 2018-04-05 MED ORDER — IBUPROFEN 800 MG PO TABS: 800.0000 mg | ORAL_TABLET | Freq: Three times a day (TID) | ORAL | 0 refills | Status: DC | PRN

## 2018-04-05 MED ORDER — VARENICLINE TARTRATE 0.5 MG X 11 & 1 MG X 42 PO MISC: ORAL | 0 refills | Status: DC

## 2018-04-05 MED ORDER — CYCLOBENZAPRINE HCL 5 MG PO TABS: 5.0000 mg | ORAL_TABLET | Freq: Every evening | ORAL | 0 refills | Status: DC | PRN

## 2018-04-05 NOTE — Assessment & Plan Note (Addendum)
Acute on chronic. Muscle spasm vs. 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  Dispense: 20 tablet; Refill: 0 - ibuprofen (ADVIL,MOTRIN) 800 MG tablet; Take 1 tablet (800 mg total) by mouth every 8 (eight) hours as needed for mild pain or moderate pain.  Dispense: 30 tablet; Refill: 0 - next steps: if she follows up with continued pain in one month after rest, can consider checking plain films

## 2018-04-05 NOTE — Progress Notes (Signed)
Subjective:    Monica Huang - 37 y.o. female MRN [id number]  Date of birth: 1980-12-01  HPI  Monica Huang is here for tobacco cessation counseling and L shoulder pain.  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 She has had some low-grade pain over the past year, however this has worsened acutely. She reports over the past few days she has had pain over the left deltoid which wakes her from sleep. - Pain radiates to the back of her neck - no numbness or paresthesias - pain has not resolved with ibuprofen/tylenol - she has not noticed any joint swelling, fevers, redness, warmth  Health Maintenance:  Health Maintenance Due  Topic Date Due  . PNEUMOCOCCAL POLYSACCHARIDE VACCINE (1) 07/18/1984  . HIV Screening  07/18/1997  . OPHTHALMOLOGY EXAM  06/03/2015  . FOOT EXAM  02/16/2017  . URINE MICROALBUMIN  11/10/2017  . PAP SMEAR  03/08/2018    -  reports that she has been smoking cigarettes.  She has been smoking about 0.50 packs per day. She has never used smokeless tobacco. - Review of Systems: Per HPI. - Past Medical History: Patient Active Problem List   Diagnosis Date Noted  . Shoulder pain, left 04/05/2018  . Anxiety and depression 06/10/2016  . Other optic atrophy, 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  . Essential hypertension, benign 05/10/2015  . Diabetes type 2, controlled (HCC) 02/19/2014  . Mechanical complication-ventricular(CSF) communicating shunt (HCC) 03/05/2013  . Hidradenitis 10/15/2012  . Tobacco abuse  01/03/2012  . Legally blind 01/03/2012  . Obesity 01/03/2012  . Marijuana smoker 01/03/2012   - Medications: reviewed and updated Current Outpatient Medications  Medication Sig Dispense Refill  . cyclobenzaprine (FLEXERIL) 5 MG tablet Take 1 tablet (5 mg total) by mouth at bedtime as needed for muscle spasms. 20 tablet 0  . HYDROcodone-acetaminophen (NORCO) 5-325 MG tablet Take 1-2 tablets by mouth every 6 (six) hours as needed for moderate pain. (Patient not taking: Reported on 03/14/2018) 30 tablet 0  . ibuprofen (ADVIL,MOTRIN) 800 MG tablet Take 1 tablet (800 mg total) by mouth every 8 (eight) hours as needed for mild pain or moderate pain. 30 tablet 0  . nitroGLYCERIN (NITRODUR - DOSED IN MG/24 HR) 0.2 mg/hr patch Apply 1/4th patch to affected achilles, change daily 30 patch 1  . sulfamethoxazole-trimethoprim (BACTRIM DS,SEPTRA DS) 800-160 MG tablet TAKE 1 TABLET BY MOUTH 2 (TWO) TIMES DAILY. CONTINUOUS FOR HIDRADENITIS SUPPURATIVA 30 tablet 0  . varenicline (CHANTIX STARTING MONTH PAK) 0.5 MG X 11 & 1 MG X 42 tablet Take one 0.5 mg tablet by mouth once daily for 3 days, then increase to one 0.5 mg tablet twice daily for 4 days, then increase to one 1 mg tablet twice daily. 53 tablet 0   No current facility-administered medications for this visit.     Review of Systems See HPI     Objective:   Physical Exam BP 130/64   Pulse 84   Temp  98.7 F (37.1 C) (Oral)   Ht  (1.727 m)   Wt 278 lb (126.1 kg)   LMP 03/07/2018 (Approximate)   BMI 42.27 kg/m  Gen: NAD, alert, cooperative with exam, well-appearing, blind CV: RRR, good S1/S2, no murmur, no edema, capillary refill brisk  Resp: CTABL, no wheezes, non-labored Abd: SNTND Skin: no rashes, normal turgor  Psych: good insight, alert and oriented Shoulder, left: +ttp over left deltoid, left trapezius. No evidence of bony deformity, asymmetry, or muscle atrophy; No tenderness over long head of biceps (bicipital groove). No TTP at  [facility] joint. Full active and passive range of motion (180 flex Elisabeth Most /150Abd /90ER /70IR), Thumb to T12 without significant tenderness. Strength 5/5 throughout. No abnormal scapular function observed. Sensation intact. Peripheral pulses intact.  Special Tests:   - Crossarm test: NEG   - Empty can: NEG   - Hawkins: NEG    Assessment & Plan:   Tobacco abuse Patient seems very motivated to quit. She reports goals of starting cessation by buying single cigarettes instead of packs. She reports to start by cutting out one of her work breaks during the day. - varenicline (CHANTIX STARTING MONTH PAK) 0.5 MG X 11 & 1 MG X 42 tablet; Take one 0.5 mg tablet by mouth once daily for 3 days, then increase to one 0.5 mg tablet twice daily for 4 days, then increase to one 1 mg tablet twice daily.  Dispense: 53 tablet; Refill: 0 - Tobacco quit line discussed - Refer to Dr. Raymondo Band for further resources and tobacco cessation counseling  Shoulder pain, left Acute on chronic. Muscle spasm vs. 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  Dispense: 20 tablet; Refill: 0 - ibuprofen (ADVIL,MOTRIN) 800 MG tablet; Take 1 tablet (800 mg total) by mouth every 8 (eight) hours as needed for mild pain or moderate pain.  Dispense: 30 tablet; Refill: 0 - next steps: if she follows up with continued pain in one month after rest, can consider checking plain films    Meds ordered this encounter  Medications  . varenicline (CHANTIX STARTING MONTH PAK) 0.5 MG X 11 & 1 MG X 42 tablet    Sig: Take one 0.5 mg tablet by mouth once daily for 3 days, then increase to one 0.5 mg tablet twice daily for 4 days, then increase to one 1 mg tablet twice daily.    Dispense:  53 tablet     Refill:  0  . cyclobenzaprine (FLEXERIL) 5 MG tablet    Sig: Take 1 tablet (5 mg total) by mouth at bedtime as needed for muscle spasms.    Dispense:  20 tablet    Refill:  0  . ibuprofen (ADVIL,MOTRIN) 800 MG tablet    Sig: Take 1 tablet (800 mg total) by mouth every 8 (eight) hours as needed for mild pain or moderate pain.    Dispense:  30 tablet    Refill:  0    Howard Pouch, MD,MS,  PGY2 04/08/2018 9:45 AM

## 2018-04-05 NOTE — Patient Instructions (Addendum)
It was a pleasure seeing you today in our clinic. Here is the treatment plan we have discussed and agreed upon together:  Tobacco: - please schedule a visit with Dr. Raymondo Band as you leave today - Chantax was sent to your pharmacy  Shoulder pain - please only use flexeril at night because it can make you sleepy.  - follow up in 1 month if symptoms worsen or fail to improve  Our clinic's number is [phone]. Please call with questions or concerns about what we discussed today.  Be well, Dr. Mosetta Putt  Sign up for My Chart to have easy access to your labs results, and communication with your primary care physician.

## 2018-04-05 NOTE — Assessment & Plan Note (Addendum)
Patient seems very motivated to quit. She reports goals of starting cessation by buying single cigarettes instead of packs. She reports to start by cutting out one of her work breaks during the day. - varenicline (CHANTIX STARTING MONTH PAK) 0.5 MG X 11 & 1 MG X 42 tablet; Take one 0.5 mg tablet by mouth once daily for 3 days, then increase to one 0.5 mg tablet twice daily for 4 days, then increase to one 1 mg tablet twice daily.  Dispense: 53 tablet; Refill: 0 - Tobacco quit line discussed - Refer to Dr. Raymondo Band for further resources and tobacco cessation counseling

## 2018-04-15 ENCOUNTER — Other Ambulatory Visit: Payer: Self-pay | Admitting: Student in an Organized Health Care Education/Training Program

## 2018-04-15 ENCOUNTER — Telehealth: Payer: Self-pay

## 2018-04-15 DIAGNOSIS — L732 Hidradenitis suppurativa: Secondary | ICD-10-CM

## 2018-04-15 MED ORDER — SULFAMETHOXAZOLE-TRIMETHOPRIM 800-160 MG PO TABS: 1.0000 | ORAL_TABLET | Freq: Two times a day (BID) | ORAL | 0 refills | Status: DC

## 2018-04-15 NOTE — Telephone Encounter (Signed)
Spoke with patient on the phone. Hidradinitis is usually managed by dermatology. She has had transportation issues getting to that office and asks me to refill her bactrim because she has draining boils at this time. We can refill it this time, but I stressed to her that she will need to schedule follow up with Derm and see them for refills in the future since they have been managing this chronic issue. She is agreeable to this plan.

## 2018-04-15 NOTE — Telephone Encounter (Signed)
Pt called nurse line requesting rx for bactrim sent to [facility] Pt call back [phone] Shawna Orleans, RN

## 2018-04-27 ENCOUNTER — Other Ambulatory Visit: Payer: Self-pay | Admitting: Student in an Organized Health Care Education/Training Program

## 2018-04-27 DIAGNOSIS — M25512 Pain in left shoulder: Secondary | ICD-10-CM

## 2018-06-01 ENCOUNTER — Other Ambulatory Visit: Payer: Self-pay | Admitting: Student in an Organized Health Care Education/Training Program

## 2018-06-01 DIAGNOSIS — Z720 Tobacco use: Secondary | ICD-10-CM

## 2018-08-05 ENCOUNTER — Other Ambulatory Visit: Payer: Self-pay

## 2018-08-05 ENCOUNTER — Encounter: Payer: Self-pay | Admitting: Student in an Organized Health Care Education/Training Program

## 2018-08-05 ENCOUNTER — Ambulatory Visit (INDEPENDENT_AMBULATORY_CARE_PROVIDER_SITE_OTHER): Payer: Medicaid Other | Admitting: Student in an Organized Health Care Education/Training Program

## 2018-08-05 VITALS — BP 128/92 | HR 93 | Temp 98.4°F | Ht 68.0 in | Wt 285.8 lb

## 2018-08-05 DIAGNOSIS — E119 Type 2 diabetes mellitus without complications: Secondary | ICD-10-CM

## 2018-08-05 DIAGNOSIS — N926 Irregular menstruation, unspecified: Secondary | ICD-10-CM | POA: Diagnosis not present

## 2018-08-05 LAB — POCT GLYCOSYLATED HEMOGLOBIN (HGB A1C): HbA1c, POC (controlled diabetic range): 5.1 % (ref 0.0–7.0)

## 2018-08-05 MED ORDER — MEDROXYPROGESTERONE ACETATE 10 MG PO TABS: 10.0000 mg | ORAL_TABLET | Freq: Every day | ORAL | 0 refills | Status: DC

## 2018-08-05 NOTE — Patient Instructions (Signed)
It was a pleasure seeing you today in our clinic.Here is the treatment plan we have discussed and agreed upon together:  A consult was placed to gynecology at today's visit.  You will receive a call to schedule an appointment. If you do not receive a call within two weeks please call our office so we can place the consult again.  Our clinic's number is [phone]. Please call with questions or concerns about what we discussed today.  Be well, Dr. Mosetta Putt

## 2018-08-05 NOTE — Progress Notes (Signed)
   CC: AUB  HPI: Monica Huang is a 37 y.o. female presenting for irregular menses and desired fertility.  Irregular menses - 3-4 days of bleeding 3-4 times per month. Bleeding is heavy like a normal period with gaps of no bleeding between. This irregularity has been going on for 2 years. Before the last two years, had normal cycles every month, however she was using contraception at that time. Patient reports that she desires a pregnancy. She denies palpitations, dyspnea, fatigue, or light headedness.  Diabetes - old diagnosis. A1c today is 5.1. Not on meds.   Review of Symptoms:  See HPI for ROS.   CC, SH/smoking status, and VS noted.  Objective: BP (!) 128/92   Pulse 93   Temp 98.4 F (36.9 C) (Oral)   Ht 5\' 8"  (1.727 m)   Wt 285 lb 12.8 oz (129.6 kg)   LMP 08/02/2018 (Exact Date)   SpO2 99%   BMI 43.46 kg/m  GEN: NAD, alert, cooperative, and pleasant. RESPIRATORY: clear to auscultation bilaterally with no wheezes, rhonchi or rales, good effort CV: RRR, no m/r/g GI: soft, non-tender, non-distended, no hepatosplenomegaly SKIN: warm and dry, no rashes or lesions NEURO: patient is blind, otherwise CN grossly intact PSYCH: AAOx3, appropriate affect  Assessment and plan:  1. Controlled type 2 diabetes mellitus without complication, without long-term current use of insulin (HCC) - HgB A1c normal - can consider recheck in 6 months  - no medication needed  2. Irregular menstrual bleeding - uncertain etiology however can try a course of provera to see if this helps stop irregular bleeding.  - no red flags for anemia - will not start OCPs to regulate cycle given desired fertility - medroxyPROGESTERone (PROVERA) 10 MG tablet; Take 1 tablet (10 mg total) by mouth daily.  Dispense: 10 tablet; Refill: 0 - because patient would like to have a baby and she is 68 years old with AUB, will not delay referral to gynecology - Ambulatory referral to Obstetrics / Gynecology   Orders  Placed This Encounter  Procedures  . Ambulatory referral to Obstetrics / Gynecology    Referral Priority:   Routine    Referral Type:   Consultation    Referral Reason:   Specialty Services Required    Requested Specialty:   Obstetrics and Gynecology    Number of Visits Requested:   1  . HgB A1c    Meds ordered this encounter  Medications  . medroxyPROGESTERone (PROVERA) 10 MG tablet    Sig: Take 1 tablet (10 mg total) by mouth daily.    Dispense:  10 tablet    Refill:  0     Howard Pouch, MD,MS,  PGY3 08/12/2018 8:42 AM

## 2018-08-21 ENCOUNTER — Other Ambulatory Visit: Payer: Self-pay

## 2018-08-21 ENCOUNTER — Other Ambulatory Visit (HOSPITAL_COMMUNITY)
Admission: RE | Admit: 2018-08-21 | Discharge: 2018-08-21 | Disposition: A | Discharge status: Home / Self Care | Payer: Medicaid Other | Source: Ambulatory Visit | Attending: Family Medicine | Admitting: Family Medicine

## 2018-08-21 ENCOUNTER — Ambulatory Visit: Payer: Medicaid Other | Admitting: Family Medicine

## 2018-08-21 VITALS — BP 140/88 | HR 67 | Temp 98.5°F | Ht 68.0 in | Wt 287.0 lb

## 2018-08-21 DIAGNOSIS — B9689 Other specified bacterial agents as the cause of diseases classified elsewhere: Secondary | ICD-10-CM

## 2018-08-21 DIAGNOSIS — N898 Other specified noninflammatory disorders of vagina: Secondary | ICD-10-CM

## 2018-08-21 DIAGNOSIS — N926 Irregular menstruation, unspecified: Secondary | ICD-10-CM | POA: Diagnosis not present

## 2018-08-21 DIAGNOSIS — B373 Candidiasis of vulva and vagina: Secondary | ICD-10-CM | POA: Diagnosis present

## 2018-08-21 DIAGNOSIS — B3731 Acute candidiasis of vulva and vagina: Secondary | ICD-10-CM

## 2018-08-21 DIAGNOSIS — N760 Acute vaginitis: Secondary | ICD-10-CM

## 2018-08-21 DIAGNOSIS — Z202 Contact with and (suspected) exposure to infections with a predominantly sexual mode of transmission: Secondary | ICD-10-CM | POA: Diagnosis not present

## 2018-08-21 LAB — POCT WET PREP (WET MOUNT)
Clue Cells Wet Prep Whiff POC: POSITIVE
Trichomonas Wet Prep HPF POC: ABSENT

## 2018-08-21 LAB — POCT URINE PREGNANCY: PREG TEST UR: NEGATIVE

## 2018-08-21 NOTE — Patient Instructions (Signed)
It was a pleasure to see you today! Thank you for choosing [facility] for your primary care. Monica Huang was seen for vaginal itching and discharge.   Today we tested for Yeast, Bacterial Vaginosis, HIV, Syphilis, and Pregnancy.   Once they come in, we will call you with the results and the appropriate treatment.   Best,  Thomes Dinning, MD, MS FAMILY MEDICINE RESIDENT - PGY2 08/21/2018 4:44 PM

## 2018-08-21 NOTE — Progress Notes (Signed)
    Subjective:  Monica MiresCrystal Huang is a 37 y.o. female who [facility] with a chief complaint of vaginal pain and itching.     HPI: Onset: Sudden Location/radiation: Vagina Duration: 3 days Character: itching, pain Aggrevating factors: Nothing Reliving factors: not done anything for it Timing: worse at night Severity: Getting  Associated symptoms: vaginal discharge,  Sexual history: Sexually active, no use of condoms   ROS:  Denies fevers, chills, abdominal pain, N/V, diarrhea, constipation, no chest pain, trouble breathing,     Objective:  Physical Exam: BP 140/88   Pulse 67   Temp 98.5 F (36.9 C) (Oral)   Ht 5\' 8"  (1.727 m)   Wt 287 lb (130.2 kg)   LMP 08/02/2018 (LMP Unknown)   SpO2 97%   BMI 43.64 kg/m   Gen: NAD, resting comfortably, legally blind GI: Soft, mild lower abdominal tenderness, Nondistended, no CVA tenderness GU: small inguinal boil on right inner thigh, no discharge on exam, no cervical motion tenderness, no adnexal masses MSK: No edema, cyanosis, or clubbing noted Skin: Warm, dry Psych: Normal affect and thought content  Results for orders placed or performed in visit on 08/21/18 (from the past 72 hour(s))  POCT Wet Prep Mellody Drown([place]et [place])     Status: Abnormal   Collection Time: 08/21/18  5:13 PM  Result Value Ref Range   Source Wet Prep POC vaginal    WBC, Wet Prep HPF POC 1-5    Bacteria Wet Prep HPF POC Many (A) Few   Clue Cells Wet Prep HPF POC Many (A) None   Clue Cells Wet Prep Whiff POC Positive Whiff    Yeast Wet Prep HPF POC None None   KOH Wet Prep POC None None   Trichomonas Wet Prep HPF POC Absent Absent  POCT urine pregnancy     Status: None   Collection Time: 08/21/18  5:13 PM  Result Value Ref Range   Preg Test, Ur Negative Negative     Assessment/Plan:  Vaginal discharge 3 days of vaginal itching and some abdominal vaginal pain.  Full STI work-up initiated along with wet prep and urine pregnancy.  Patient has  diabetes, he is infection most likely.  Results are pending.  -We will call patient with results and treat appropriately    Lab Orders     HIV antibody (with reflex)     RPR     POCT Wet Prep [facility]([place])     POCT urine pregnancy  No orders of the defined types were placed in this encounter.   Thomes DinningBrad Thompson, MD, MS FAMILY MEDICINE RESIDENT - PGY2 08/22/2018 9:13 AM

## 2018-08-21 NOTE — Assessment & Plan Note (Addendum)
3 days of vaginal itching and some abdominal vaginal pain.  Full STI work-up initiated along with wet prep and urine pregnancy.  Patient has diabetes, he is infection most likely.  Results are pending.  -We will call patient with results and treat appropriately

## 2018-08-22 ENCOUNTER — Telehealth: Payer: Self-pay

## 2018-08-22 ENCOUNTER — Encounter: Payer: Self-pay | Admitting: Family Medicine

## 2018-08-22 LAB — CERVICOVAGINAL ANCILLARY ONLY
CHLAMYDIA, DNA PROBE: NEGATIVE
Neisseria Gonorrhea: NEGATIVE

## 2018-08-22 MED ORDER — METRONIDAZOLE 500 MG PO TABS: 500.0000 mg | ORAL_TABLET | Freq: Two times a day (BID) | ORAL | 0 refills | Status: AC

## 2018-08-22 MED ORDER — FLUCONAZOLE 150 MG PO TABS: 150.0000 mg | ORAL_TABLET | Freq: Once | ORAL | 0 refills | Status: AC

## 2018-08-22 NOTE — Telephone Encounter (Signed)
Patient left message on nurse line stating she has not been informed of any results but medications have been sent to her pharmacy. She would like her results to know what she is being treated for. No notes on lab results.  Call back is [phone]  Ples Specter, RN [facility] RN)

## 2018-08-22 NOTE — Progress Notes (Signed)
Please call patient and inform her that her G/C is negative.

## 2018-08-22 NOTE — Telephone Encounter (Signed)
Pt called nurse line requesting her results. Informed pt of all labs and all questions answered.

## 2018-08-22 NOTE — Telephone Encounter (Signed)
It was send to [place] as a CC'd chart and those notes don't attach to the chart.  Heres what that note said : Patient tested positive for BV and yeast. Urine pregnancy was negative. Please call patient and inform her of results. Also inform her that I have sent an Rx for Flagyl for 7 days and Diflucan x1 for BV and Yeast, respectively. Thank you. , I haven't spoken to her yet.  Monica Huang,CMA

## 2018-09-10 ENCOUNTER — Ambulatory Visit: Payer: Medicaid Other | Admitting: Obstetrics and Gynecology

## 2018-09-27 ENCOUNTER — Ambulatory Visit: Payer: Medicaid Other | Admitting: Obstetrics and Gynecology

## 2019-02-03 ENCOUNTER — Encounter: Payer: Self-pay | Admitting: Family Medicine

## 2019-02-03 ENCOUNTER — Ambulatory Visit: Payer: Medicaid Other

## 2019-02-03 ENCOUNTER — Ambulatory Visit (INDEPENDENT_AMBULATORY_CARE_PROVIDER_SITE_OTHER): Payer: Medicaid Other | Admitting: Family Medicine

## 2019-02-03 ENCOUNTER — Ambulatory Visit: Payer: Medicaid Other | Admitting: Family Medicine

## 2019-02-03 ENCOUNTER — Other Ambulatory Visit: Payer: Self-pay

## 2019-02-03 DIAGNOSIS — G8929 Other chronic pain: Secondary | ICD-10-CM | POA: Diagnosis not present

## 2019-02-03 DIAGNOSIS — G5601 Carpal tunnel syndrome, right upper limb: Secondary | ICD-10-CM | POA: Diagnosis not present

## 2019-02-03 DIAGNOSIS — M25512 Pain in left shoulder: Secondary | ICD-10-CM

## 2019-02-03 DIAGNOSIS — E119 Type 2 diabetes mellitus without complications: Secondary | ICD-10-CM | POA: Diagnosis not present

## 2019-02-03 DIAGNOSIS — G5600 Carpal tunnel syndrome, unspecified upper limb: Secondary | ICD-10-CM | POA: Insufficient documentation

## 2019-02-03 LAB — POCT GLYCOSYLATED HEMOGLOBIN (HGB A1C): HEMOGLOBIN A1C: 5.4 % (ref 4.0–5.6)

## 2019-02-03 MED ORDER — CYCLOBENZAPRINE HCL 5 MG PO TABS: 5.0000 mg | ORAL_TABLET | Freq: Every evening | ORAL | 2 refills | Status: DC | PRN

## 2019-02-03 NOTE — Progress Notes (Signed)
Established Patient Office Visit  Subjective:  Patient ID: Monica Huang, female    DOB: 1981/07/30  Age: 38 y.o. MRN: [id number]  CC:  Chief Complaint  Patient presents with  . Shoulder Pain  . Hand Pain    HPI Monica Huang presents for left shoulder and right hand pain.  Issues: 1. Right hand pain for ~2 weeks.  Does have night pain.  Has trouble localizing where pain is.  Endorses dorsum, palmar, ulnar and radial sides.  Also endorses wrist.  Positive numbness, again in a poorly localizing distribution.  Works as a Geographical information systems officer. 2. Left shoulder pain.  Present for months.  Feels tightness in neck.  Has DDD in lumbar spine but no prior neck X rays.  States pain will at times shoot down to her left hand.   3. DM maybe.  Now diet controled.  Lost considerable weight.  Behind on several HPDP measures if we consider her diabetic.  Past Medical History:  Diagnosis Date  . Chronic back pain   . Chronic leg pain   . Diabetes mellitus without complication (HCC)    Type II  . Dizziness   . Hydradenitis   . Hyperlipidemia   . Legally blind   . Macular degeneration   . Pseudotumor cerebri syndrome 2005   shunt placed- and legally blind  . Sciatica     Past Surgical History:  Procedure Laterality Date  . CSF SHUNT     2 revisions  . MULTIPLE EXTRACTIONS WITH ALVEOLOPLASTY Bilateral 11/02/2017   Procedure: MULTIPLE EXTRACTION;  Surgeon: Ocie Doyne, DDS;  Location: [facility] OR;  Service: Oral Surgery;  Laterality: Bilateral;    Family History  Problem Relation Age of Onset  . Diabetes Mother   . Hypertension Mother   . Diabetes Father     Social History   Socioeconomic History  . Marital status: Single    Spouse name: Not on file  . Number of children: Not on file  . Years of education: Not on file  . Highest education level: Not on file  Occupational History  . Not on file  Social Needs  . Financial resource strain: Not on file  . Food insecurity:    Worry: Not on file    Inability: Not on file  . Transportation needs:    Medical: Not on file    Non-medical: Not on file  Tobacco Use  . Smoking status: Current Every Day Smoker    Packs/day: 0.50    Types: Cigarettes  . Smokeless tobacco: Never Used  Substance and Sexual Activity  . Alcohol use: No  . Drug use: No    Comment: marijuana -- bag per day.   Marland Kitchen Sexual activity: Yes    Birth control/protection: None    Comment: 1 partners  Lifestyle  . Physical activity:    Days per week: Not on file    Minutes per session: Not on file  . Stress: Not on file  Relationships  . Social connections:    Talks on phone: Not on file    Gets together: Not on file    Attends religious service: Not on file    Active member of club or organization: Not on file    Attends meetings of clubs or organizations: Not on file    Relationship status: Not on file  . Intimate partner violence:    Fear of current or ex partner: Not on file    Emotionally abused: Not on file  Physically abused: Not on file    Forced sexual activity: Not on file  Other Topics Concern  . Not on file  Social History Narrative   On disability since 2005 due to vision loss from psuedotumor cerebri.   Did not finish highschool-- completed the 9th grade.    Walking 2 x week for .           Outpatient Medications Prior to Visit  Medication Sig Dispense Refill  . HYDROcodone-acetaminophen (NORCO) 5-325 MG tablet Take 1-2 tablets by mouth every 6 (six) hours as needed for moderate pain. (Patient not taking: Reported on 03/14/2018) 30 tablet 0  . ibuprofen (ADVIL,MOTRIN) 800 MG tablet Take 1 tablet (800 mg total) by mouth every 8 (eight) hours as needed for mild pain or moderate pain. 30 tablet 0  . medroxyPROGESTERone (PROVERA) 10 MG tablet Take 1 tablet (10 mg total) by mouth daily. 10 tablet 0  . nitroGLYCERIN (NITRODUR - DOSED IN MG/24 HR) 0.2 mg/hr patch Apply 1/4th patch to affected achilles, change daily 30 patch 1  .  sulfamethoxazole-trimethoprim (BACTRIM DS,SEPTRA DS) 800-160 MG tablet Take 1 tablet by mouth 2 (two) times daily. Continuous for Hidradenitis suppurativa 30 tablet 0  . varenicline (CHANTIX STARTING MONTH PAK) 0.5 MG X 11 & 1 MG X 42 tablet Take one 0.5 mg tablet by mouth once daily for 3 days, then increase to one 0.5 mg tablet twice daily for 4 days, then increase to one 1 mg tablet twice daily. 53 tablet 0  . cyclobenzaprine (FLEXERIL) 5 MG tablet TAKE 1 TABLET (5 MG TOTAL) BY MOUTH AT BEDTIME AS NEEDED FOR MUSCLE SPASMS. 20 tablet 0   No facility-administered medications prior to visit.     No Known Allergies  ROS Review of Systems    Objective:    Physical Exam  BP 114/76   Pulse 85   Temp 98.7 F (37.1 C) (Oral)   Ht  (1.727 m)   Wt 278 lb 3.2 oz (126.2 kg)   LMP 01/28/2019 (Approximate)   SpO2 97%   BMI 42.30 kg/m  Wt Readings from Last 3 Encounters:  02/03/19 278 lb 3.2 oz (126.2 kg)  08/21/18 287 lb (130.2 kg)  08/05/18 285 lb 12.8 oz (129.6 kg)   Right hand.  Pain and numbness on flexion of wrist.  Good strength for both grip and opposition.  No thenar atrophy.  Left shoulder and Neck.  Poor posture with some neck exageration lordosis.  Positive tenderness over both trapezius.  Cannot lift left arm above shoulder due to pain.  Normal strength and reflexes of both upper ext.  Health Maintenance Due  Topic Date Due  . PNEUMOCOCCAL POLYSACCHARIDE VACCINE AGE 21-64 HIGH RISK  07/18/1984  . HIV Screening  07/18/1997  . OPHTHALMOLOGY EXAM  06/03/2015  . FOOT EXAM  02/16/2017  . URINE MICROALBUMIN  11/10/2017  . PAP SMEAR-Modifier  03/08/2018  . HEMOGLOBIN A1C  02/03/2019    There are no preventive care reminders to display for this patient.  Lab Results  Component Value Date   TSH 2.240 07/26/2017   Lab Results  Component Value Date   WBC 6.0 11/02/2017   HGB 12.4 11/02/2017   HCT 37.0 11/02/2017   MCV 97.4 11/02/2017   PLT 323 11/02/2017   Lab  Results  Component Value Date   NA 137 11/02/2017   K 4.5 11/02/2017   CO2 22 11/02/2017   GLUCOSE 81 11/02/2017   BUN 8 11/02/2017  CREATININE 0.90 11/02/2017   BILITOT 0.3 05/10/2015   ALKPHOS 54 05/10/2015   AST 12 05/10/2015   ALT 20 05/10/2015   PROT 7.5 05/10/2015   ALBUMIN 4.1 05/10/2015   CALCIUM 8.6 (L) 11/02/2017   ANIONGAP 9 11/02/2017   Lab Results  Component Value Date   CHOL 147 03/11/2015   Lab Results  Component Value Date   HDL 45 (L) 03/11/2015   Lab Results  Component Value Date   LDLCALC 81 03/11/2015   Lab Results  Component Value Date   TRIG 106 03/11/2015   Lab Results  Component Value Date   CHOLHDL 3.3 03/11/2015   Lab Results  Component Value Date   HGBA1C 5.4 02/03/2019      Assessment & Plan:   Problem List Items Addressed This Visit    Shoulder pain, left    May be cervical radiculopathy or rotator cuff syndrome.  Start with X rays.      Relevant Medications   cyclobenzaprine (FLEXERIL) 5 MG tablet   Other Relevant Orders   DG Cervical Spine Complete   DG Shoulder Left   Diabetes type 2, controlled (HCC)   Relevant Orders   POCT glycosylated hemoglobin (Hb A1C) (Completed)   Carpal tunnel syndrome   Relevant Medications   cyclobenzaprine (FLEXERIL) 5 MG tablet   Other Relevant Orders   TSH      Meds ordered this encounter  Medications  . cyclobenzaprine (FLEXERIL) 5 MG tablet    Sig: Take 1 tablet (5 mg total) by mouth at bedtime as needed for muscle spasms.    Dispense:  30 tablet    Refill:  2    DX Code Needed  .    Follow-up: No follow-ups on file.    Moses Manners, MD

## 2019-02-03 NOTE — Patient Instructions (Addendum)
I will call with blood and x ray results. I sent in a muscle relaxer for your neck and shoulder discomfort. Please make an appointment on the way out for Dr. Mosetta Putt in 2-3 weeks to see how this is going and what next steps are needed.

## 2019-02-03 NOTE — Assessment & Plan Note (Addendum)
May be cervical radiculopathy or rotator cuff syndrome.  Start with X rays.  Rx with flexeril which has helped in the past.

## 2019-02-03 NOTE — Assessment & Plan Note (Signed)
I am not sure she still deserves this diagnosis.  A1C now in non diabetic range off meds.  Will defer to PCP about Dx and HPDP measures

## 2019-02-03 NOTE — Assessment & Plan Note (Signed)
Clinically has right carpal tunnel but history is not a perfect fit.  Check TSH and Rx with cock up splint.  FU 2 weeks.

## 2019-02-04 LAB — TSH: TSH: 0.769 u[IU]/mL (ref 0.450–4.500)

## 2019-02-06 ENCOUNTER — Encounter: Payer: Self-pay | Admitting: Student in an Organized Health Care Education/Training Program

## 2019-02-06 DIAGNOSIS — R7303 Prediabetes: Secondary | ICD-10-CM | POA: Insufficient documentation

## 2019-02-13 ENCOUNTER — Telehealth: Payer: Self-pay | Admitting: Student in an Organized Health Care Education/Training Program

## 2019-02-13 NOTE — Telephone Encounter (Signed)
Due to ongoing concerns with social distancing in light of COVID-19 cases, I called this patient to offer options for their visit on 3/23. Their visit was listed for follow up/well woman visit.   I offered to discuss their care via phone and prescribe medications and order labs as needed, in order to spare them potential exposure to the community by coming to our office.  Patient was recently seen on 3/9 by Dr. Leveda Anna for her neck and shoulder pain as well as diabetes.  Diabetes continues to be well controlled and she does not even really fit the criteria for diabetes anymore based on her A1c.  Neck pain and shoulder pain, she was prescribed Flexeril and encouraged to get space.  She has not had the x-rays completed yet.  She reports that the Flexeril is improving her pain.  She reports that she uses the shoulder repeatedly at work and so it continues to be painful by the end of the day.  Plans to get her x-rays completed.  She is agreeable to postponing her well woman visit.  She will need a Pap smear at follow-up, her last PAP was completed in 2016 and was WNL.  She was encouraged to call the office in May to make a appointment.  I counseled the patient that we are here for them during this trying time. I asked that they reach out via phone with any concerns regarding these problems or others that may come up. They were particularly counseled to call with concerns for cough, SOB, or fever.   Routing to Preceptor in accordance with our procedure. This is a no charge visit.   Howard Pouch, MD

## 2019-02-17 ENCOUNTER — Encounter: Payer: Medicaid Other | Admitting: Student in an Organized Health Care Education/Training Program

## 2019-02-27 ENCOUNTER — Ambulatory Visit
Admission: RE | Admit: 2019-02-27 | Discharge: 2019-02-27 | Disposition: A | Discharge status: Home / Self Care | Payer: Medicaid Other | Source: Ambulatory Visit | Attending: Family Medicine | Admitting: Family Medicine

## 2019-02-27 ENCOUNTER — Other Ambulatory Visit: Payer: Self-pay

## 2019-02-27 DIAGNOSIS — M25512 Pain in left shoulder: Principal | ICD-10-CM

## 2019-02-27 DIAGNOSIS — G8929 Other chronic pain: Secondary | ICD-10-CM

## 2019-02-27 IMAGING — CR LEFT SHOULDER - 2+ VIEW
3 series · 3 of 3 positions shown · non-contrast
Comparison: None.

CLINICAL DATA: No known injury in neck or left shoulder, pain in
left side of neck with movement for 4-5 months, radiculopathy In
left hand, shunt placed TAZZ in [M4]

EXAM:
LEFT SHOULDER - 2+ VIEW

[w shoulder grashey left]
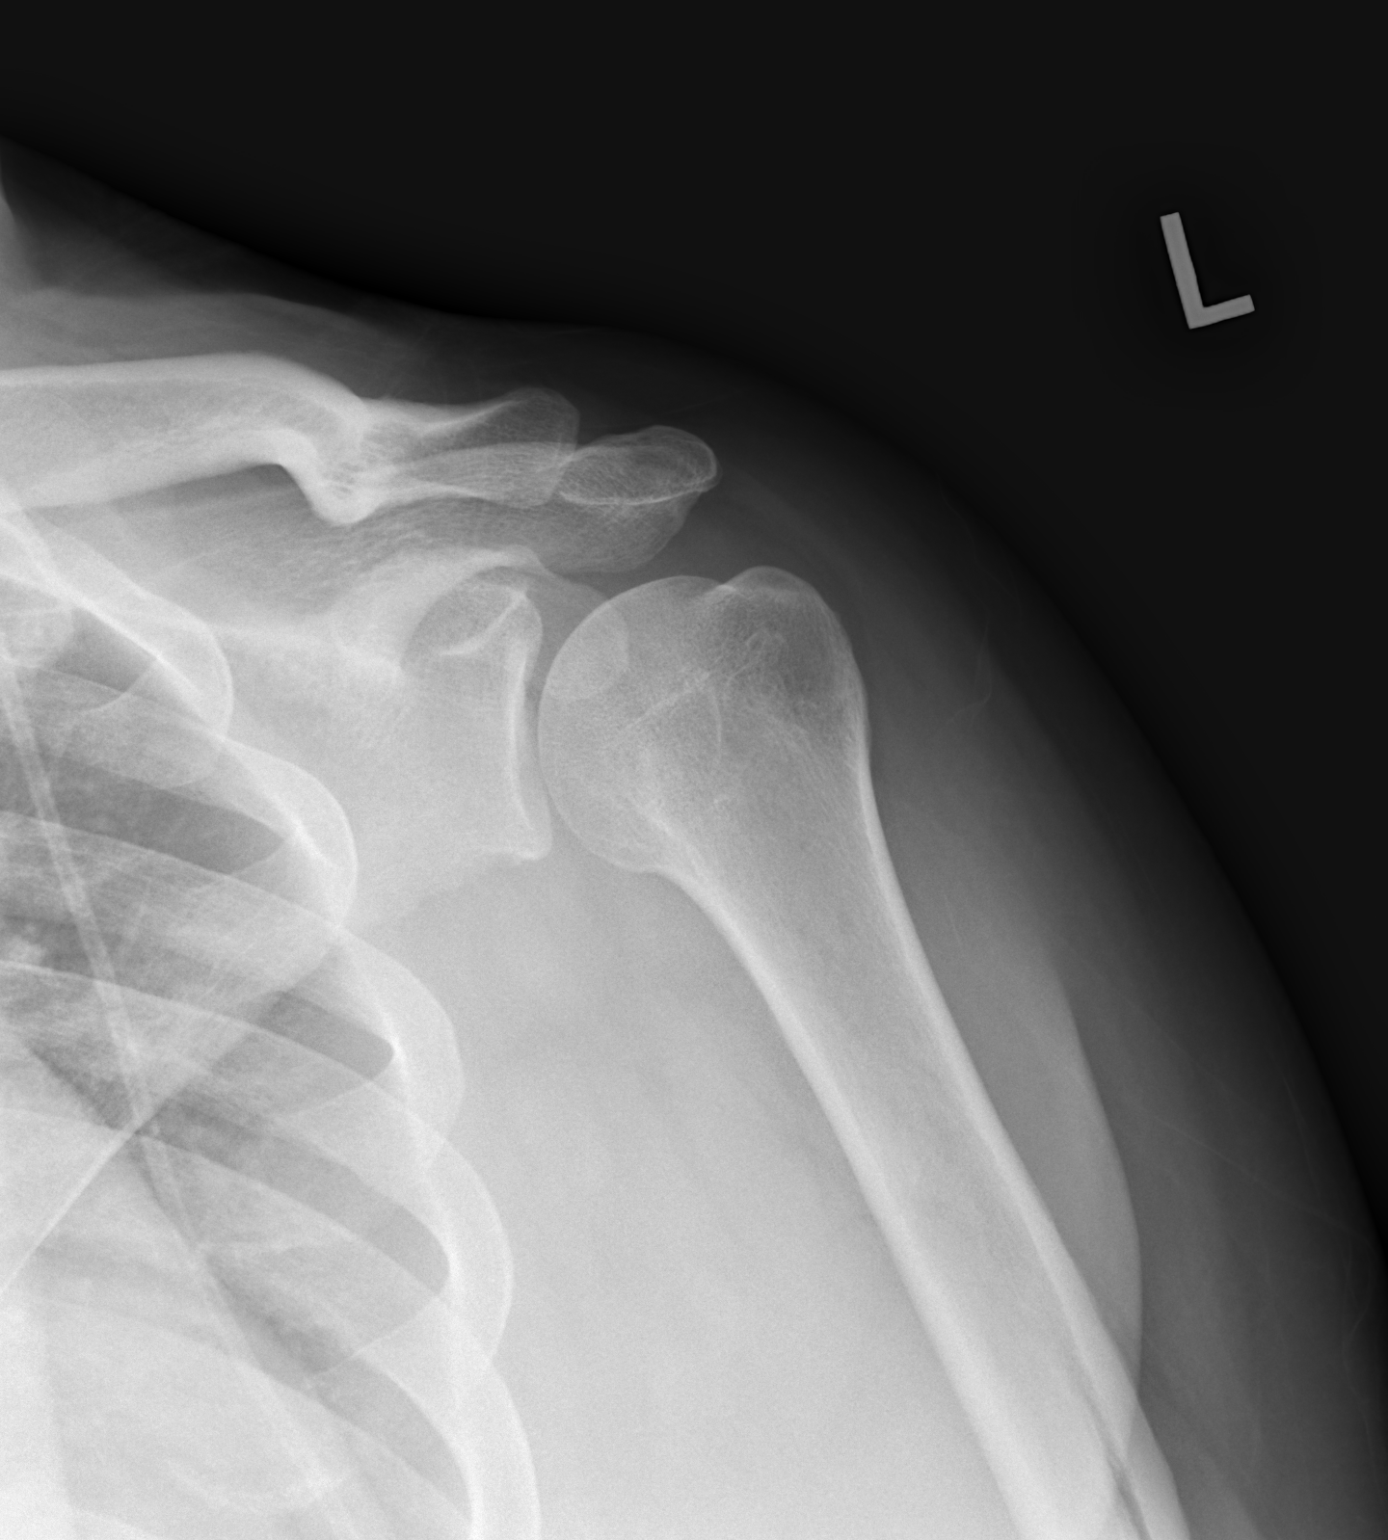

[w shoulder y-view left]
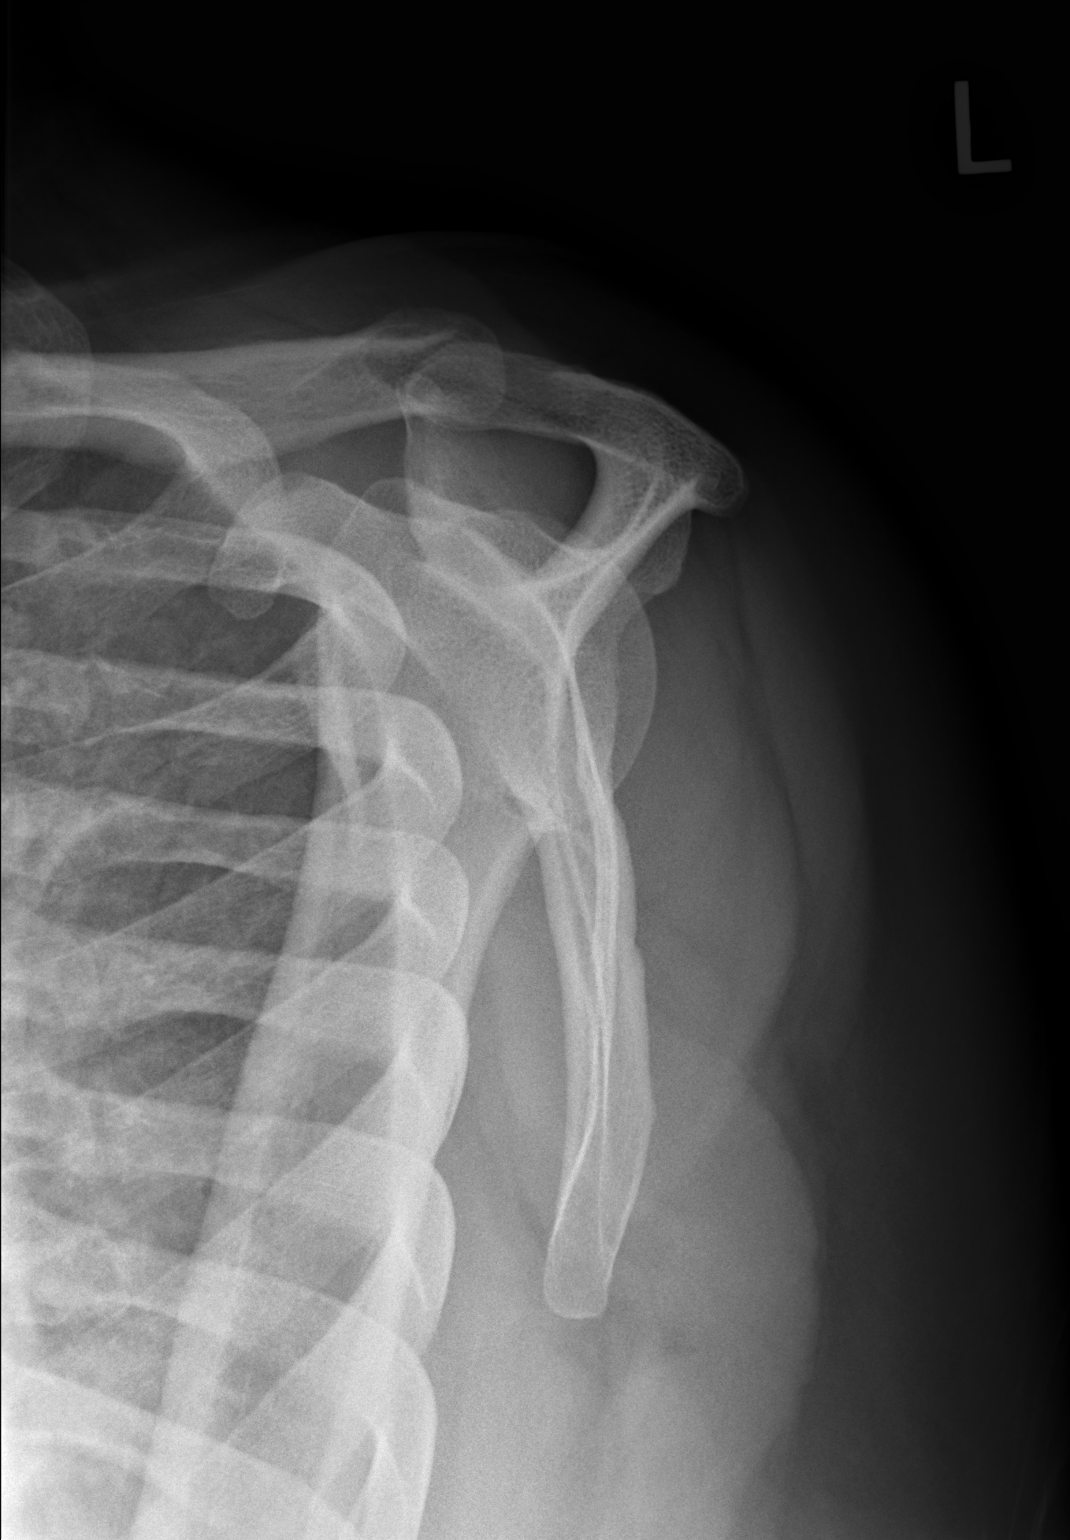

[w shoulder axillary left]
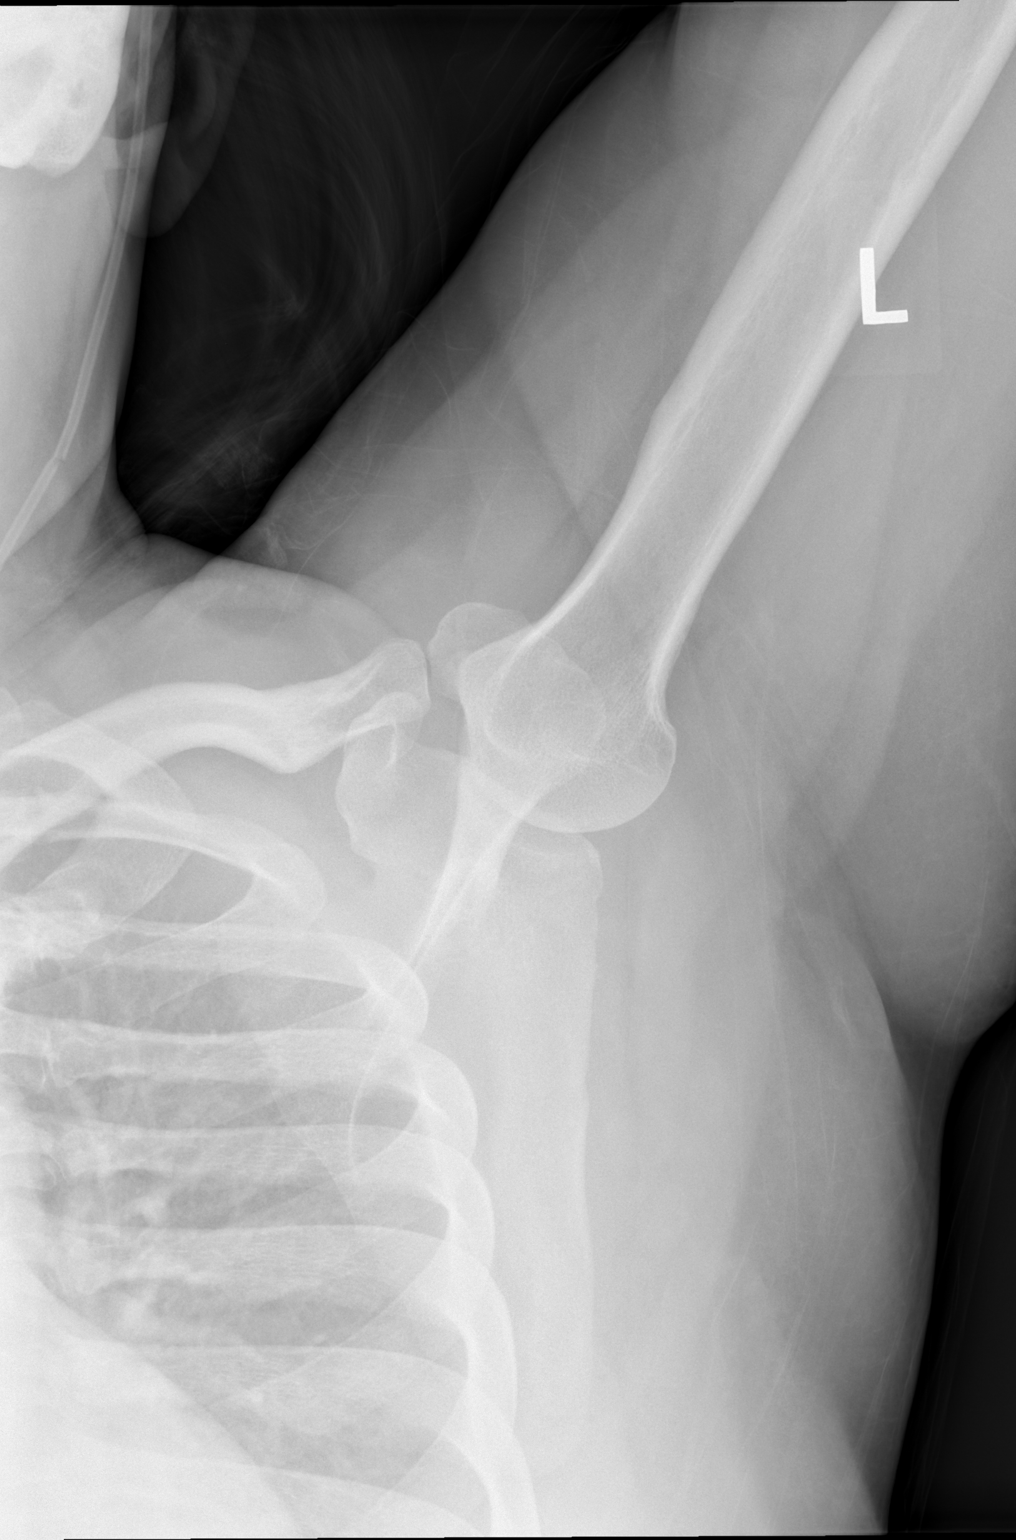

[3 of 3 positions shown; findings below may reference images not displayed]

FINDINGS: There is no evidence of fracture or dislocation. There is no
evidence of arthropathy or other focal bone abnormality. Soft
tissues are unremarkable.
IMPRESSION: Negative.

## 2019-02-27 IMAGING — CR CERVICAL SPINE - COMPLETE 4+ VIEW
5 series · 5 of 5 positions shown · non-contrast
Comparison: None.

CLINICAL DATA: No known injury in neck or left shoulder, pain in
left side of neck with movement for 4-5 months, radiculopathy In
left hand, shunt placed CUTBERTO in [M7]

EXAM:
CERVICAL SPINE - COMPLETE 4+ VIEW

[w cervical spine lat]
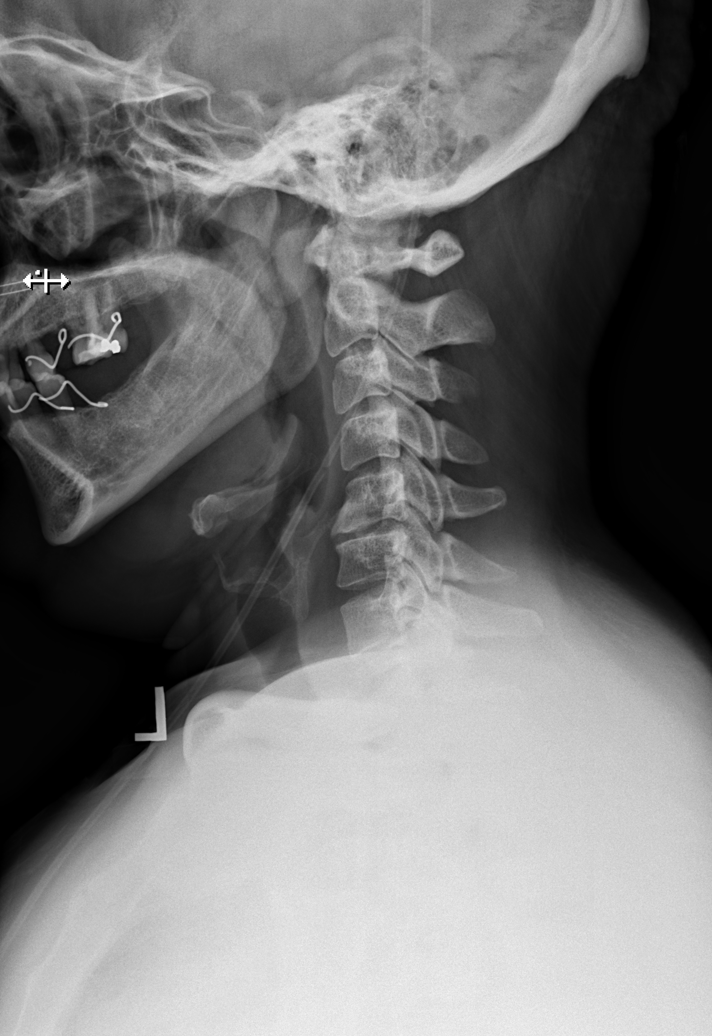

[w cervical spine ap_obl (1 of 2)]
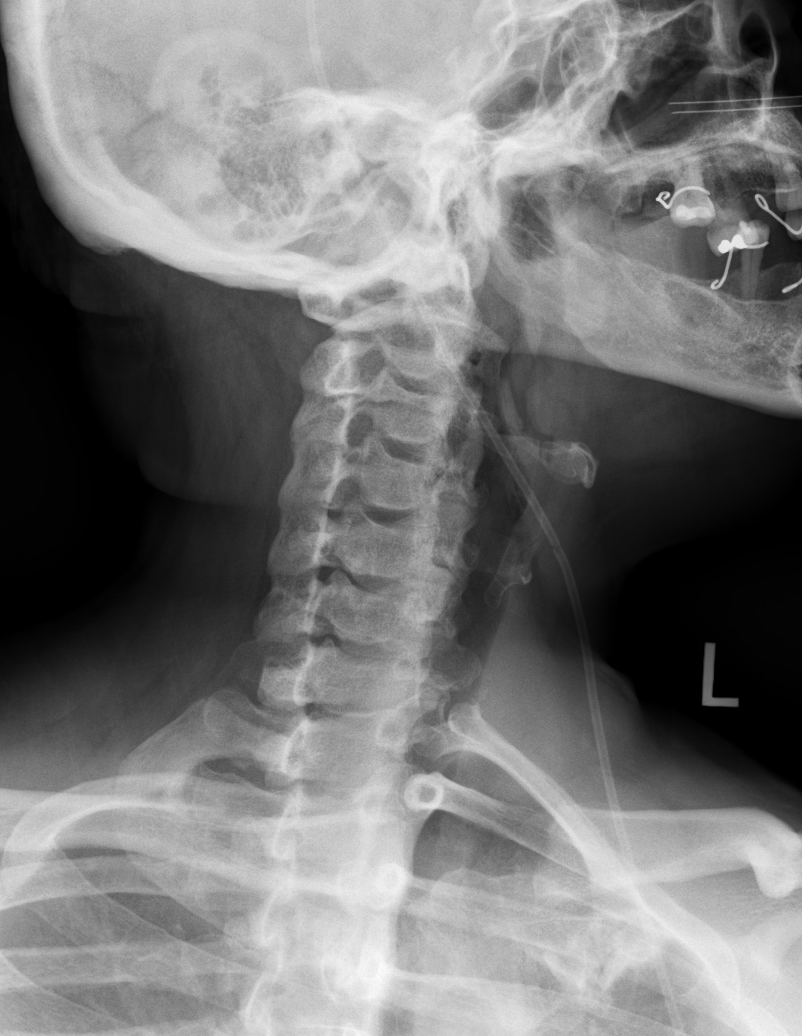

[w cervical spine ap_obl (2 of 2)]
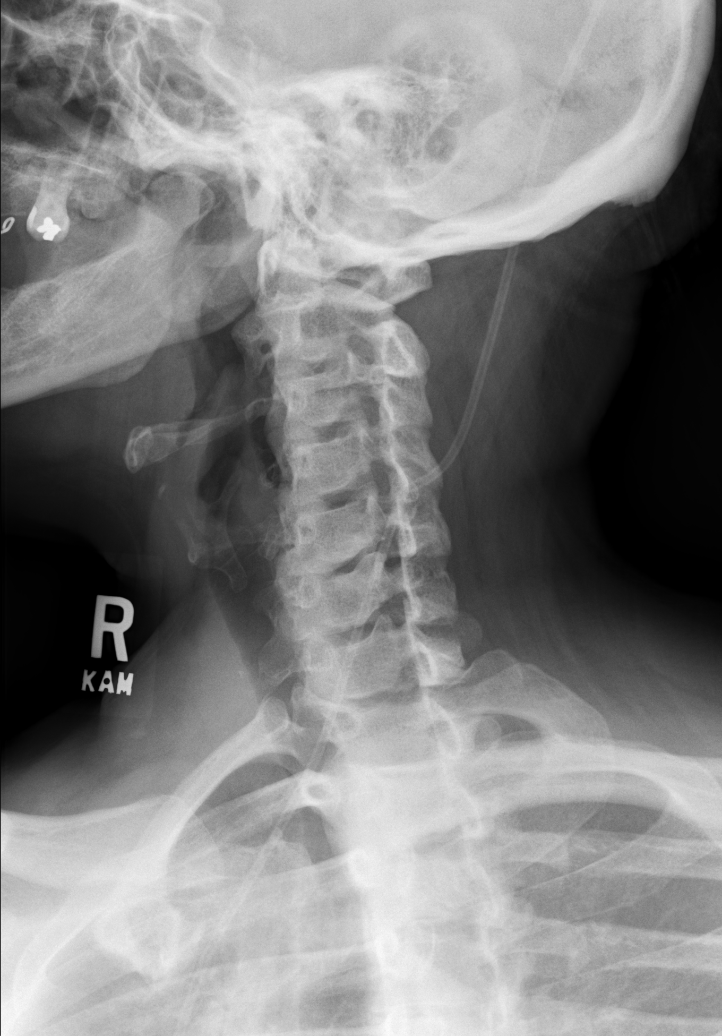

[w cervical spine ap]
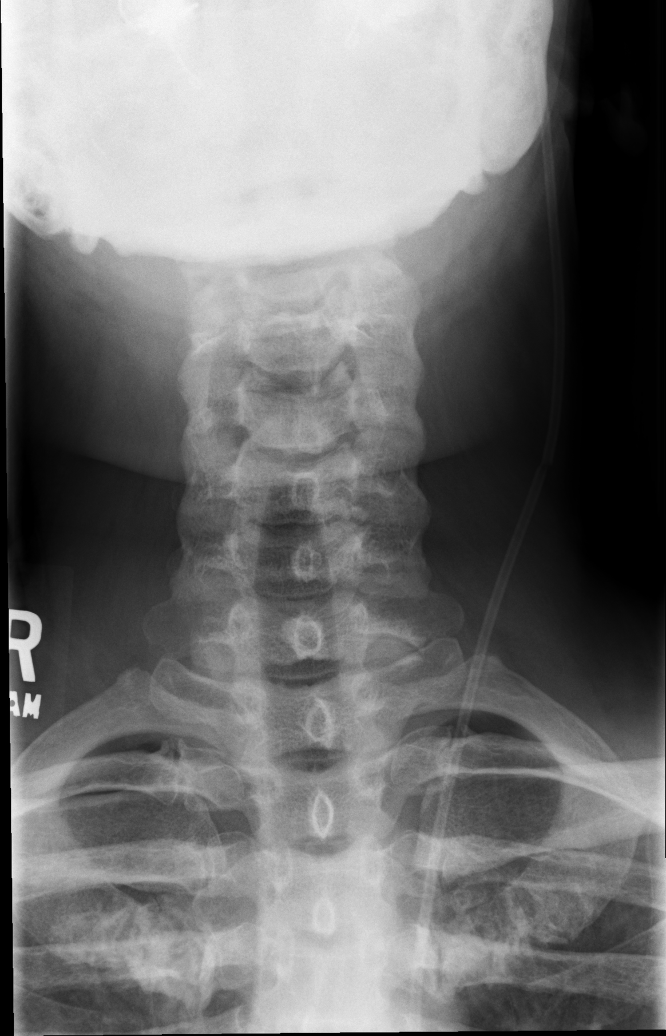

[w cervical spine odontoid]
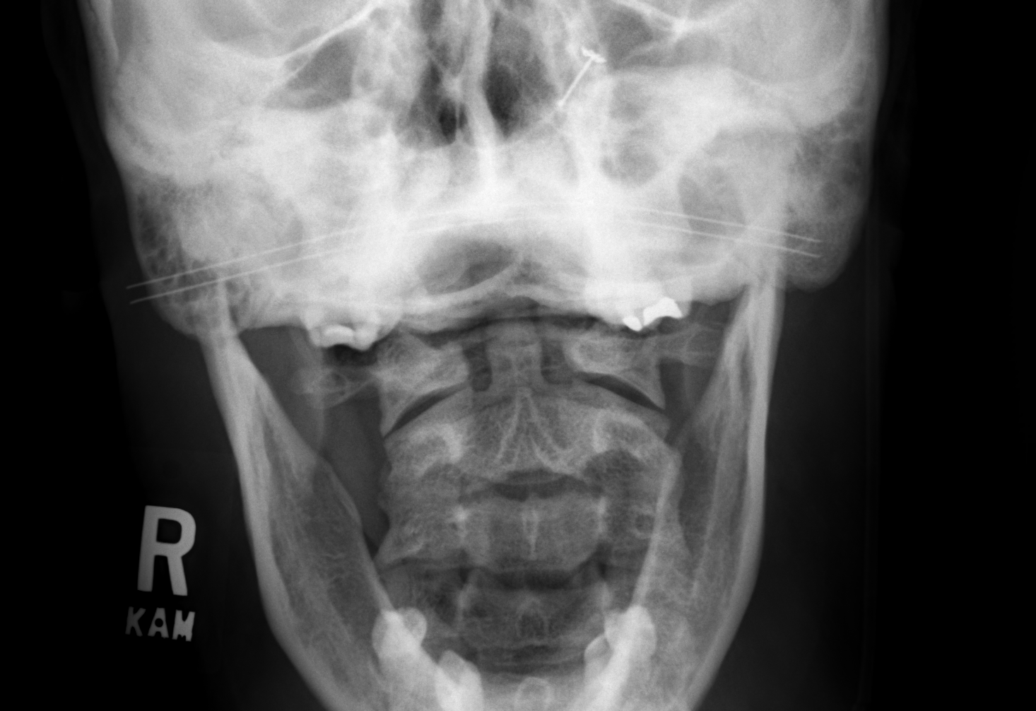

[5 of 5 positions shown; findings below may reference images not displayed]

FINDINGS: No fracture, bone lesion or spondylolisthesis.

Mild loss of disc height with anterior endplate spurring at C5-C6.
Remaining cervical discs are well preserved. Neural foramina widely
patent.

Soft tissues unremarkable.
IMPRESSION: 1. No fracture or acute finding.  No spondylolisthesis.
2. Mild disc degenerative changes at C5-C6.

## 2019-02-28 ENCOUNTER — Telehealth: Payer: Self-pay | Admitting: Student in an Organized Health Care Education/Training Program

## 2019-02-28 NOTE — Telephone Encounter (Signed)
Called for work restrictions - light duty x 2 weeks.  I feel this is appropriate.  Letter provided.

## 2019-02-28 NOTE — Telephone Encounter (Signed)
Patient had shoulder xray per Hensel, and was requesting form for work due to heavy lifting. Please give patient a call back.

## 2019-03-05 ENCOUNTER — Other Ambulatory Visit: Payer: Self-pay

## 2019-03-05 ENCOUNTER — Encounter (HOSPITAL_COMMUNITY): Payer: Self-pay | Admitting: Emergency Medicine

## 2019-03-05 ENCOUNTER — Ambulatory Visit (HOSPITAL_COMMUNITY)
Admission: EM | Admit: 2019-03-05 | Discharge: 2019-03-05 | Disposition: A | Discharge status: Home / Self Care | Payer: Medicaid Other | Attending: Family Medicine | Admitting: Family Medicine

## 2019-03-05 DIAGNOSIS — M25512 Pain in left shoulder: Secondary | ICD-10-CM | POA: Diagnosis not present

## 2019-03-05 MED ORDER — PREDNISONE 50 MG PO TABS: 50.0000 mg | ORAL_TABLET | Freq: Every day | ORAL | 0 refills | Status: AC

## 2019-03-05 NOTE — ED Provider Notes (Signed)
[facility]    CSN: [id number] Arrival date & time: 03/05/19  1033     History   Chief Complaint Chief Complaint  Patient presents with  . Arm Pain    HPI Monica Huang is a 38 y.o. female history of DM type II, hyperlipidemia, visually impaired, hypertension presenting today for evaluation of neck and left arm pain.  Patient states that since this morning she has had difficulty moving her left arm.  She associates this with increased pain that she feels in her neck.  She has pain in her neck chronically, and "pinched nerve".  She has been taking Flexeril for this.  She has not taken any other new medicines for her discomfort.  Denies any injury, fall or increase in activity of recently.  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  Denies leg pain or leg weakness.  Occasional tingling sensation, but denies numbness.  States her last A1c was 4 something.  HPI  Past Medical History:  Diagnosis Date  . Chronic back pain   . Chronic leg pain   . Diabetes mellitus without complication (HCC)    Type II  . Dizziness   . Hydradenitis   . Hyperlipidemia   . Legally blind   . Macular degeneration   . Pseudotumor cerebri syndrome 2005   shunt placed- and legally blind  . Sciatica     Patient Active Problem List   Diagnosis Date Noted  . Prediabetes 02/06/2019  . Carpal tunnel syndrome 02/03/2019  . Shoulder pain, left 04/05/2018  . Vaginal discharge 07/05/2016  . Anxiety and depression 06/10/2016  . Other optic atrophy, 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  . Essential hypertension, benign 05/10/2015  . Mechanical complication-ventricular(CSF) communicating shunt (HCC) 03/05/2013  . Hidradenitis 10/15/2012  . Tobacco abuse 01/03/2012  . Legally blind 01/03/2012  . Obesity 01/03/2012  . Marijuana smoker 01/03/2012    Past Surgical History:  Procedure Laterality Date  . CSF SHUNT     2 revisions  . MULTIPLE EXTRACTIONS WITH ALVEOLOPLASTY  Bilateral 11/02/2017   Procedure: MULTIPLE EXTRACTION;  Surgeon: Ocie DoyneJensen, Scott, DDS;  [facility];  Service: Oral Surgery;  Laterality: Bilateral;    OB History   No obstetric history on file.      Home Medications    Prior to Admission medications   Medication Sig Start Date End Date Taking? Authorizing Provider  cyclobenzaprine (FLEXERIL) 5 MG tablet Take 1 tablet (5 mg total) by mouth at bedtime as needed for muscle spasms. 02/03/19  Yes Hensel, Santiago BumpersWilliam A, MD  ibuprofen (ADVIL,MOTRIN) 800 MG tablet Take 1 tablet (800 mg total) by mouth every 8 (eight) hours as needed for mild pain or moderate pain. 04/05/18   Howard PouchFeng, Lauren, MD  medroxyPROGESTERone (PROVERA) 10 MG tablet Take 1 tablet (10 mg total) by mouth daily. 08/05/18   Howard PouchFeng, Lauren, MD  nitroGLYCERIN (NITRODUR - DOSED IN MG/24 HR) 0.2 mg/hr patch Apply 1/4th patch to affected achilles, change daily 03/20/18   Hudnall, Azucena FallenShane R, MD  predniSONE (DELTASONE) 50 MG tablet Take 1 tablet (50 mg total) by mouth daily for 5 days. With food 03/05/19 03/10/19  Wieters, Hallie C, PA-C  sulfamethoxazole-trimethoprim (BACTRIM DS,SEPTRA DS) 800-160 MG tablet Take 1 tablet by mouth 2 (two) times daily. Continuous for Hidradenitis suppurativa 04/15/18   Howard PouchFeng, Lauren, MD  varenicline (CHANTIX STARTING MONTH PAK) 0.5 MG X 11 & 1 MG X 42 tablet Take one 0.5 mg tablet by mouth once daily for 3 days,  then increase to one 0.5 mg tablet twice daily for 4 days, then increase to one 1 mg tablet twice daily. 04/05/18   Howard Pouch, MD    Family History Family History  Problem Relation Age of Onset  . Diabetes Mother   . Hypertension Mother   . Diabetes Father     Social History Social History   Tobacco Use  . Smoking status: Current Every Day Smoker    Packs/day: 0.50    Types: Cigarettes  . Smokeless tobacco: Never Used  Substance Use Topics  . Alcohol use: No  . Drug use: No    Comment: marijuana -- bag per day.      Allergies   Patient has no  known allergies.   Review of Systems Review of Systems  Constitutional: Negative for fatigue and fever.  Eyes: Negative for visual disturbance.  Respiratory: Negative for shortness of breath.   Cardiovascular: Negative for chest pain.  Gastrointestinal: Negative for abdominal pain, nausea and vomiting.  Musculoskeletal: Positive for arthralgias, myalgias and neck pain. Negative for joint swelling and neck stiffness.  Skin: Negative for color change, rash and wound.  Neurological: Negative for dizziness, weakness, light-headedness and headaches.     Physical Exam Triage Vital Signs ED Triage Vitals  Enc Vitals Group     BP 03/05/19 1054 114/78     Pulse Rate 03/05/19 1054 93     Resp 03/05/19 1054 16     Temp 03/05/19 1054 98.7 F (37.1 C)     Temp Source 03/05/19 1054 Oral     SpO2 03/05/19 1054 98 %     Weight --      Height --      Head Circumference --      Peak Flow --      Pain Score 03/05/19 1052 10     Pain Loc --      Pain Edu? --      Excl. in GC? --    No data found.  Updated Vital Signs BP 114/78 (BP Location: Right Arm)   Pulse 93   Temp 98.7 F (37.1 C) (Oral)   Resp 16   LMP 02/28/2019 (Approximate)   SpO2 98%   Visual Acuity Right Eye Distance:   Left Eye Distance:   Bilateral Distance:    Right Eye Near:   Left Eye Near:    Bilateral Near:     Physical Exam Vitals signs and nursing note reviewed.  Constitutional:      General: She is not in acute distress.    Appearance: She is well-developed.  HENT:     Head: Normocephalic and atraumatic.  Eyes:     Conjunctiva/sclera: Conjunctivae normal.  Neck:     Musculoskeletal: Neck supple.     Comments: Full active range of motion of neck Cardiovascular:     Rate and Rhythm: Normal rate and regular rhythm.     Heart sounds: No murmur.  Pulmonary:     Effort: Pulmonary effort is normal. No respiratory distress.     Breath sounds: Normal breath sounds.  Abdominal:     Palpations: Abdomen  is soft.     Tenderness: There is no abdominal tenderness.  Musculoskeletal:     Comments: Mild tenderness to palpation of lower cervical spine, increased tenderness throughout left paraspinal cervical musculature as well as left trapezius extending to shoulder.  Nontender over left clavicle, mild discomfort in [facility] joint, decreased active and passive range of motion of shoulder.  Skin:  General: Skin is warm and dry.  Neurological:     Mental Status: She is alert.      UC Treatments / Results  Labs (all labs ordered are listed, but only abnormal results are displayed) Labs Reviewed - No data to display  EKG None  Radiology No results found.  Procedures Procedures (including critical care time)  Medications Ordered in UC Medications - No data to display  Initial Impression / Assessment and Plan / UC Course  I have reviewed the triage vital signs and the nursing notes.  Pertinent labs & imaging results that were available during my care of the patient were reviewed by me and considered in my medical decision making (see chart for details).    No injury, do not suspect underlying bony abnormality. Patient with muscle tenderness throughout left trapezius, likely contributing to discomfort and difficulty moving the shoulder.  Patient does have history of diabetes, passive range of motion limited, but unclear if this is true limitation versus resistance from pain.  Discussed that if adhesive capsulitis with patient versus other inflammation/muscular strain cause.  Will treat with course of prednisone for the next 5 days followed by NSAIDs.  Continue Flexeril as needed.  Range of motion exercises provided.  Continue to monitor,Discussed strict return precautions. Patient verbalized understanding and is agreeable with plan.   Final Clinical Impressions(s) / UC Diagnoses   Final diagnoses:  Acute pain of left shoulder     Discharge Instructions     Continue flexeril as needed  Begin Prednisone daily for the next 5 days with food and in the morning if you are able- this will increase blood sugars temporarily After finishing prednisone you may take Tylenol [phone] and ibuprofen 600-800mg  together for continued anti-inflammatory use Please work on range of motion exercises attached in the back  Please follow-up if symptoms worsening, developing weakness in leg, dizziness, lightheadedness, chest pain, persistent symptoms   ED Prescriptions    Medication Sig Dispense Auth. Provider   predniSONE (DELTASONE) 50 MG tablet Take 1 tablet (50 mg total) by mouth daily for 5 days. With food 5 tablet Wieters, Ribera C, PA-C     Controlled Substance Prescriptions [place] Controlled Substance Registry consulted? Not Applicable   Lew Dawes, [place] 03/05/19 1135

## 2019-03-05 NOTE — Discharge Instructions (Signed)
Continue flexeril as needed Begin Prednisone daily for the next 5 days with food and in the morning if you are able- this will increase blood sugars temporarily After finishing prednisone you may take Tylenol [phone] and ibuprofen 600-800mg  together for continued anti-inflammatory use Please work on range of motion exercises attached in the back  Please follow-up if symptoms worsening, developing weakness in leg, dizziness, lightheadedness, chest pain, persistent symptoms

## 2019-03-05 NOTE — ED Triage Notes (Signed)
Pt states she was diagnosed with a pinched nerve in her neck.  She is having issues with left arm movement when waking up today.

## 2019-03-10 ENCOUNTER — Telehealth (INDEPENDENT_AMBULATORY_CARE_PROVIDER_SITE_OTHER): Payer: Medicaid Other | Admitting: Family Medicine

## 2019-03-10 ENCOUNTER — Other Ambulatory Visit: Payer: Self-pay

## 2019-03-10 DIAGNOSIS — M5412 Radiculopathy, cervical region: Secondary | ICD-10-CM | POA: Insufficient documentation

## 2019-03-10 NOTE — Progress Notes (Signed)
[facility] Telemedicine Visit  Patient consented to have visit conducted via telephone.  Encounter participants: Patient: Monica Huang  Provider: Unknown Jim  Others (if applicable): None  Chief Complaint: Left Shoulder Pain  HPI: Patient reports 4 to 5 months of left arm pain in her left neck and shoulder.  She states she "has a pinched nerve."  She states it is been using progressively worse.  Pain radiates down her arm.  She states that the pain is nagging always, but worsens at night.  She states that moving her arm makes it hurt more as well.  She notes occasional numbness and tingling down her arm as well as some right-sided weakness.  She states that she is still able to have almost full range of motion, but is mildly limited above her head due to pain.  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  She states that she was told to avoid this for 2 weeks per Dr. Cyndia Skeeters recommendations, but did not improve the pain.  She has been trying Flexeril every night, Tylenol, heating pad, icy hot patch without much improvement.  She was seen at urgent care on 4/8, because she states "I woke up that morning and I could not move my arm."  She denies that this is happened again.  She denies any right arm symptoms or left leg symptoms.  She has chronic right leg pain, but says that this is not the same.  She does not have any associated headaches or changes in vision.  At urgent care, patient was given a prescription for prednisone 50 mg x 5 days, which she did not fill.  Patient states "I do not do that, there are just things that I do not do, I do not take steroids and I do not take flu shots."  Patient states "I just want to be able to sleep, the pain is too bad and I cannot sleep."  ROS: Per HPI  Pertinent PMHx: Prediabetes, A1c 5.4 on 02/03/2019.  Exam:  Respiratory: Patient speaking in complete sentences  and no evidence of respiratory distress over the phone.  Assessment/Plan:  Cervical radiculitis C5-C6 narrowing on previous XR.  Doubt adhesive capsulitis given the patient does not have overt diabetes and reports good range of motion, limited only by pain.  Patient continues to have left arm pain.  Taking Flexeril with minimal improvement.  Discussed with patient that would also recommend prednisone burst and continue with Flexeril nightly.  Last A1c 5.4.  Patient agreed to this plan and stated that she would pick it up from the pharmacy today.  Patient also reports that she still has the exercises she received from urgent care. -Prednisone 50 mg x 5 days -Continue Flexeril nightly -Exercises daily when starts prednisone -Recommend heat -Return precautions given, patient voiced understanding    Time spent on phone with patient: 16 minutes

## 2019-03-10 NOTE — Assessment & Plan Note (Addendum)
C5-C6 narrowing on previous XR.  Doubt adhesive capsulitis given the patient does not have overt diabetes and reports good range of motion, limited only by pain.  Patient continues to have left arm pain.  Taking Flexeril with minimal improvement.  Discussed with patient that would also recommend prednisone burst and continue with Flexeril nightly.  Last A1c 5.4.  Patient agreed to this plan and stated that she would pick it up from the pharmacy today.  Patient also reports that she still has the exercises she received from urgent care. -Prednisone 50 mg x 5 days -Continue Flexeril nightly -Exercises daily when starts prednisone -Recommend heat -Return precautions given, patient voiced understanding

## 2019-04-06 IMAGING — CR LEFT KNEE 3 VIEWS
3 series · 3 of 3 positions shown · non-contrast
Comparison: None.

CLINICAL DATA: LEFT knee pain around patella.

EXAM:
LEFT KNEE 3 VIEWS

[w knee ap left]
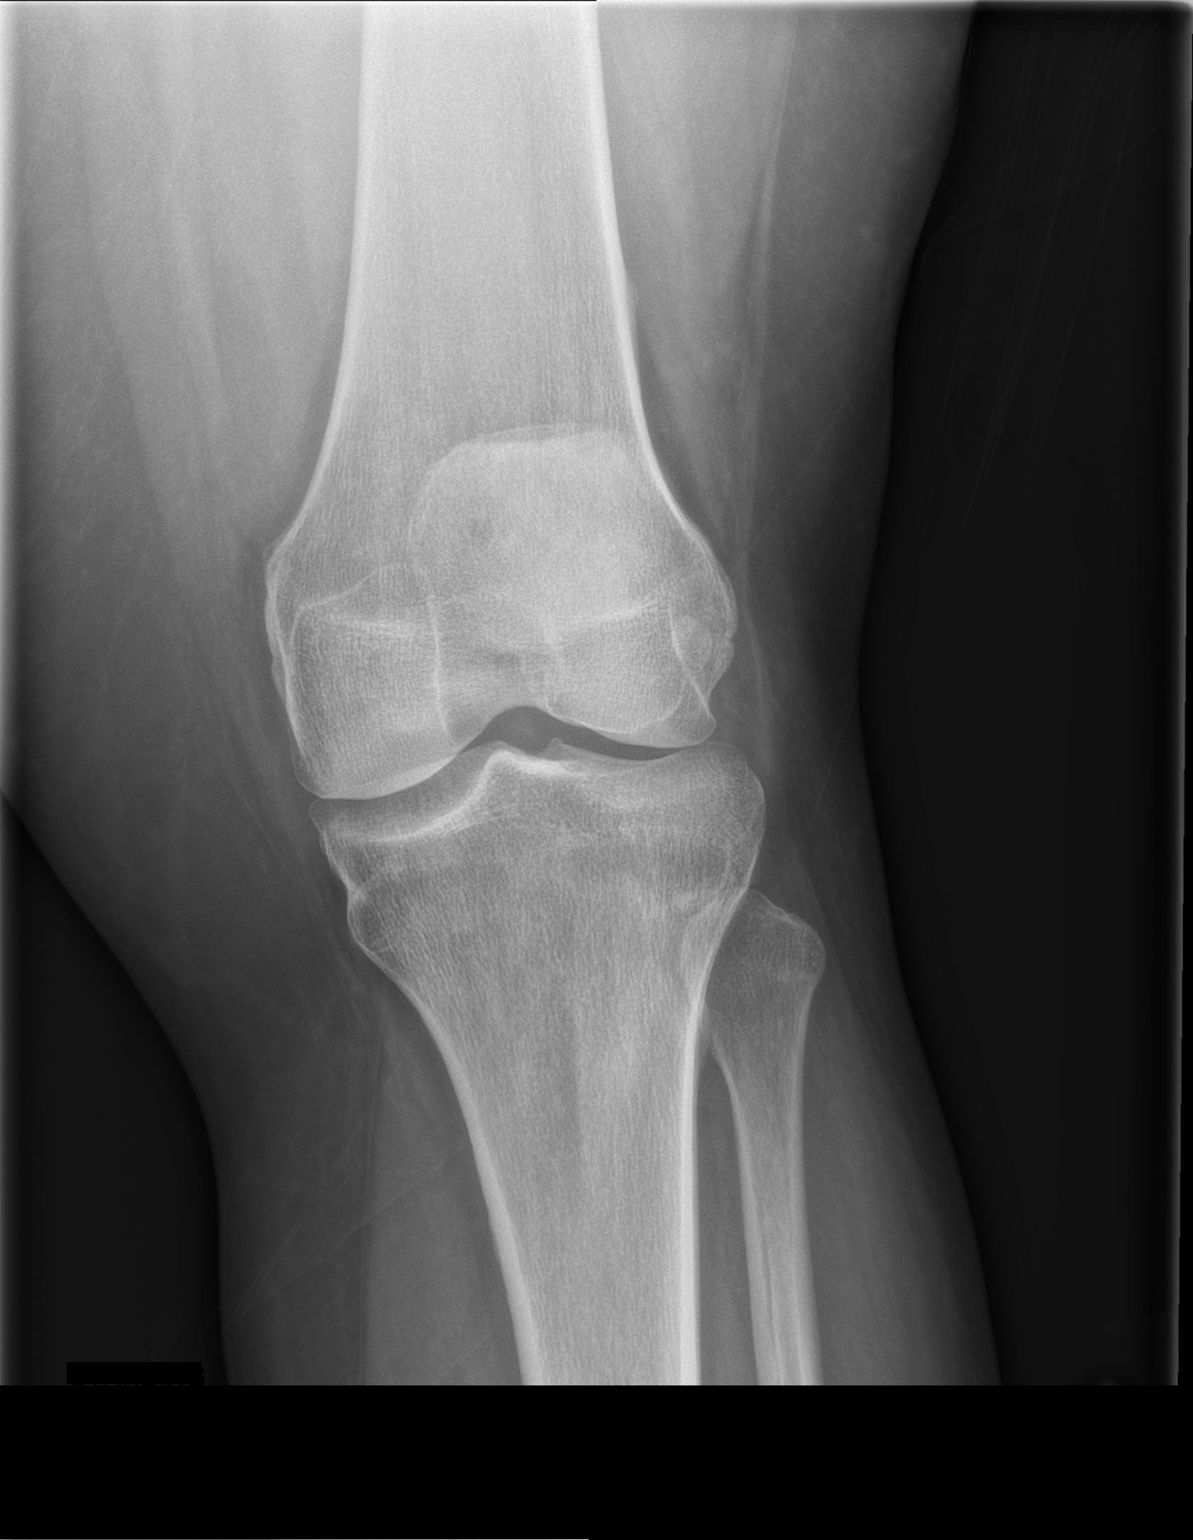

[w knee lat left]
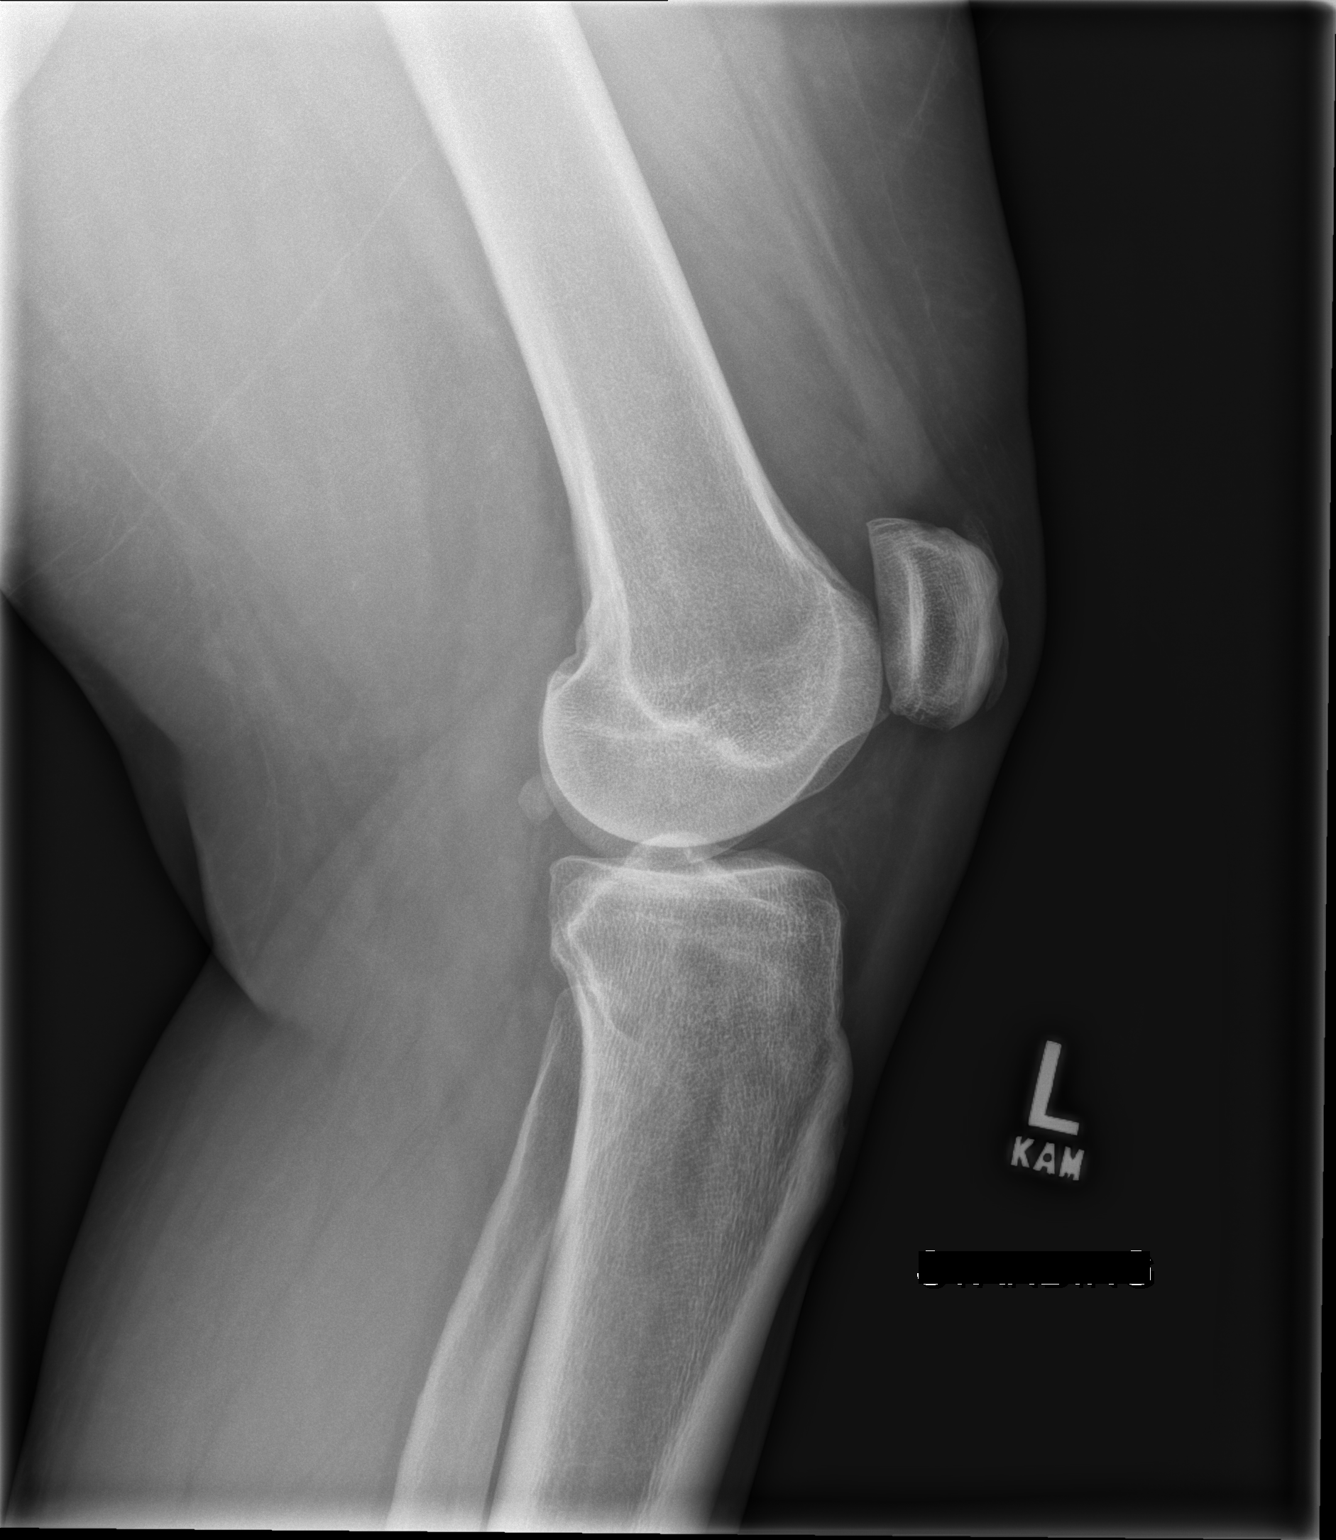

[x knee sunrise left]
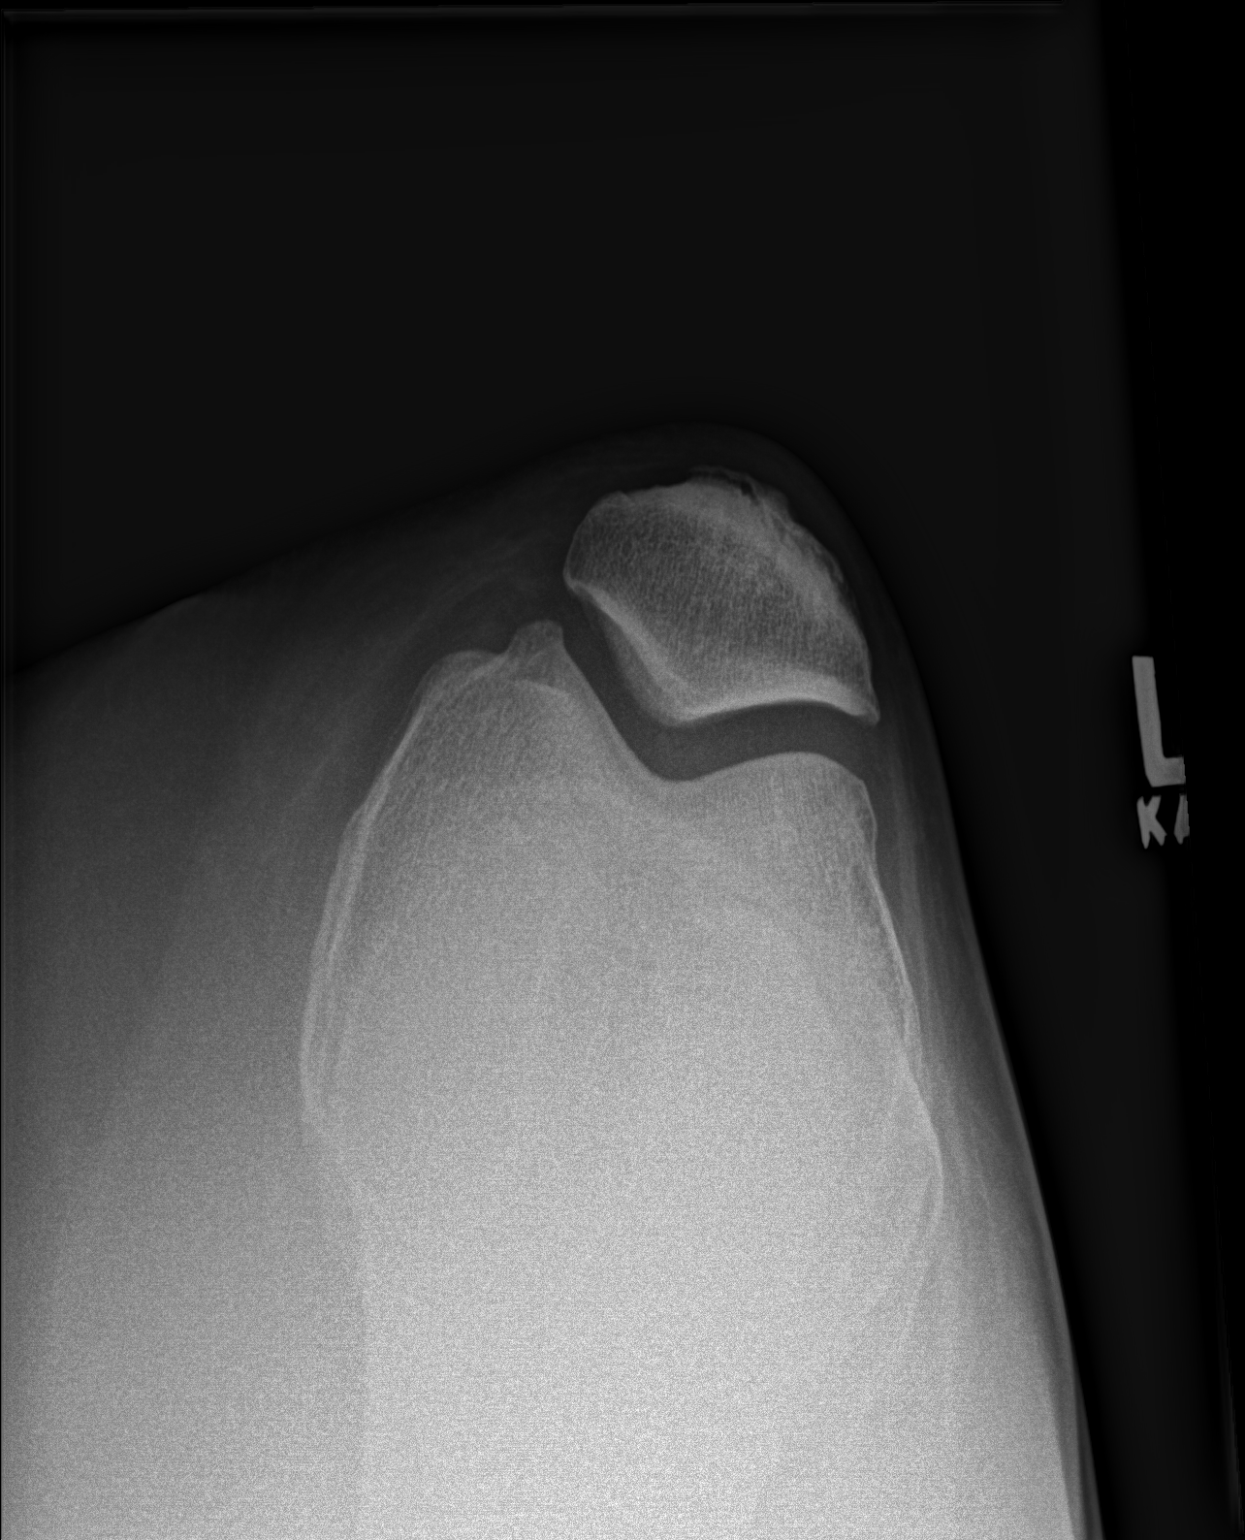

[3 of 3 positions shown; findings below may reference images not displayed]

FINDINGS: No evidence of fracture, dislocation, or joint effusion. No evidence
of arthropathy or other focal bone abnormality. Soft tissues are
unremarkable.
IMPRESSION: Negative.

## 2019-04-29 ENCOUNTER — Ambulatory Visit (INDEPENDENT_AMBULATORY_CARE_PROVIDER_SITE_OTHER): Payer: Medicaid Other | Admitting: Family Medicine

## 2019-04-29 ENCOUNTER — Other Ambulatory Visit: Payer: Self-pay

## 2019-04-29 ENCOUNTER — Other Ambulatory Visit (HOSPITAL_COMMUNITY)
Admission: RE | Admit: 2019-04-29 | Discharge: 2019-04-29 | Disposition: A | Discharge status: Home / Self Care | Payer: Medicaid Other | Source: Ambulatory Visit | Attending: Family Medicine | Admitting: Family Medicine

## 2019-04-29 ENCOUNTER — Encounter: Payer: Self-pay | Admitting: Family Medicine

## 2019-04-29 VITALS — BP 126/86 | HR 94

## 2019-04-29 DIAGNOSIS — Z124 Encounter for screening for malignant neoplasm of cervix: Secondary | ICD-10-CM

## 2019-04-29 DIAGNOSIS — Z7689 Persons encountering health services in other specified circumstances: Secondary | ICD-10-CM | POA: Insufficient documentation

## 2019-04-29 DIAGNOSIS — Z3200 Encounter for pregnancy test, result unknown: Secondary | ICD-10-CM | POA: Diagnosis not present

## 2019-04-29 DIAGNOSIS — R102 Pelvic and perineal pain: Secondary | ICD-10-CM

## 2019-04-29 DIAGNOSIS — N941 Unspecified dyspareunia: Secondary | ICD-10-CM | POA: Diagnosis not present

## 2019-04-29 LAB — POCT WET PREP (WET MOUNT)
Clue Cells Wet Prep Whiff POC: POSITIVE
Trichomonas Wet Prep HPF POC: ABSENT

## 2019-04-29 LAB — POCT URINE PREGNANCY: Preg Test, Ur: NEGATIVE

## 2019-04-29 NOTE — Progress Notes (Signed)
   CC: abdominal pain   HPI  Pain with intercourse - this has been going on 2 months. Sexually active with men, just one female partner. Hurts with complete penetration, not with insertion. No hx of STI that she knows of. No vaginal discharge. No fever. No worry about fidelity with partner. Denies hx of sexual abuse or trauma. Feels safe in her current relationship.   Her abd pain hurting without doing anything is worse x 1 week. First came on maybe February or march. She has pain in the bottom of her stomach. Feels like she hasn't had enough water. Doesn't hurt with urination, hurts with certain movements. She notices this in addition to above pain, but feels like intercourse is the worst. She doesn't think she has a hx of fibroids. No abdominal surgery - states her surgery was CSF shunt and repair but no lower abd surgery. BM are about once per day. She can't see the consistency. Has occasionally felt pain with straining. Hasn't tried any meds, tried ibu once or so but nothing really. Hasn't tried heat.   She has menses, states she had two in April and none in may. Sometimes irregular over the last year.   ROS: Denies CP, SOB, abdominal pain, dysuria, changes in BMs.   CC, SH/smoking status, and VS noted  Objective: BP 126/86   Pulse 94   LMP 04/21/2019 (Approximate)   SpO2 96%  Gen: NAD, alert, cooperative, and pleasant. HEENT: NCAT, EOMI, PERRL CV: RRR, no murmur Resp: CTAB, no wheezes, non-labored Abd: Soft, nondistended, mildly TTP in right lower quadrant without rebound, BS present, no guarding or organomegaly GU: Scarring from previous hidradenitis to bilateral inguinal areas, otherwise normal perineal skin, normal vaginal mucosa, cervix partially visualized, no CMT  Ext: No edema, warm Neuro: Alert and oriented, Speech clear, No gross deficits  Assessment and plan:  Pelvic and lower abdominal pain: We will work-up for PID or infection, but I suspect this may be fibroids or  endometriosis, especially as her dyspareunia is with deep penetration.  Will start with pelvic ultrasound.  We will call her with results.  Less likely bowel etiology as she is having normal bowel movements, no urinary symptoms.  Orders Placed This Encounter  Procedures  . US Pelvis Complete    Standing Status:   Future    Standing Expiration Date:   06/28/2020    Order Specific Question:   Reason for Exam (SYMPTOM  OR DIAGNOSIS REQUIRED)    Answer:   pelvic pain, including with intercourse    Order Specific Question:   Preferred imaging location?    Answer:   [facility]  . Microalbumin/Creatinine Ratio, Urine  . POCT urine pregnancy  . POCT Wet [facility])   Loni Muse, MD, PGY3 04/29/2019 11:30 AM

## 2019-04-30 LAB — CERVICOVAGINAL ANCILLARY ONLY
Chlamydia: NEGATIVE
Neisseria Gonorrhea: NEGATIVE

## 2019-05-01 LAB — MICROALBUMIN / CREATININE URINE RATIO
Creatinine, Urine: 241 mg/dL
Microalb/Creat Ratio: 1 mg/g creat (ref 0–29)
Microalbumin, Urine: 3 ug/mL

## 2019-05-02 LAB — CYTOLOGY - PAP
Adequacy: ABSENT
Diagnosis: NEGATIVE

## 2019-05-08 ENCOUNTER — Ambulatory Visit (HOSPITAL_COMMUNITY)
Admission: RE | Admit: 2019-05-08 | Discharge: 2019-05-08 | Disposition: A | Discharge status: Home / Self Care | Payer: Medicaid Other | Source: Ambulatory Visit | Attending: Family Medicine | Admitting: Family Medicine

## 2019-05-08 ENCOUNTER — Other Ambulatory Visit: Payer: Self-pay

## 2019-05-08 DIAGNOSIS — R102 Pelvic and perineal pain: Secondary | ICD-10-CM | POA: Diagnosis present

## 2019-05-08 DIAGNOSIS — N941 Unspecified dyspareunia: Secondary | ICD-10-CM | POA: Insufficient documentation

## 2019-05-08 IMAGING — US US PELVIS COMPLETE
1 series · 15 of 25 positions shown · non-contrast
Comparison: None

CLINICAL DATA: Pelvic pain, dyspareunia

EXAM:
TRANSABDOMINAL ULTRASOUND OF PELVIS
TECHNIQUE: Transabdominal ultrasound examination of the pelvis was performed
including evaluation of the uterus, ovaries, adnexal regions, and
pelvic cul-de-sac. Transvaginal imaging was not performed preop
threads.

[Series 1: us pelvis complete · 43 acquisitions, 15 frames shown]
[im 1/43]
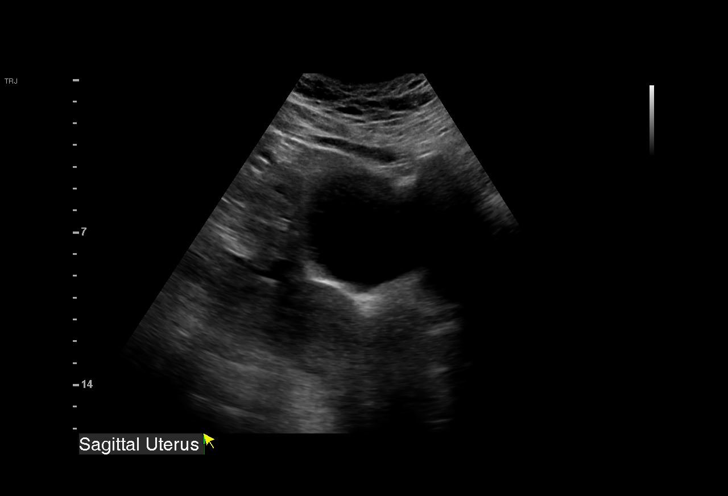
[im 4/43]
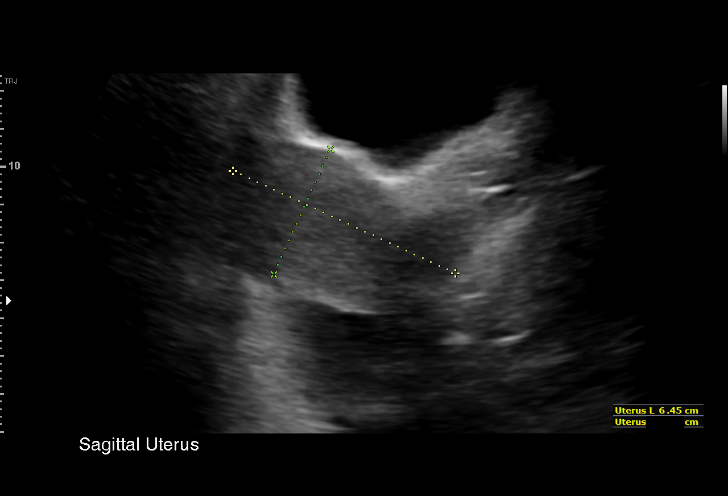
[im 8/43]
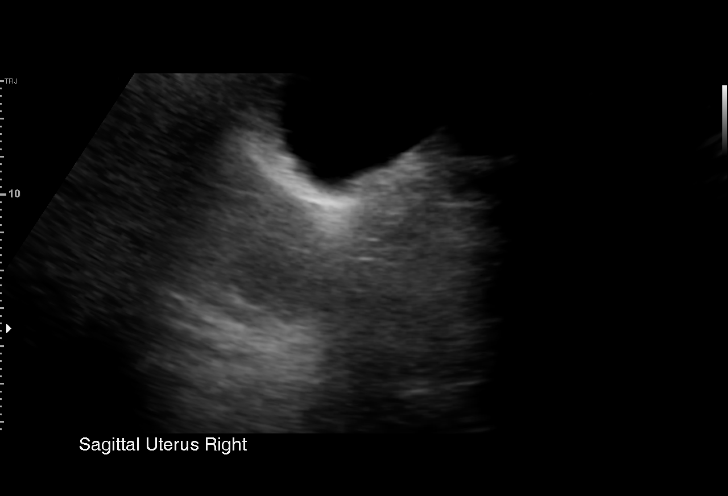
[im 9/43]
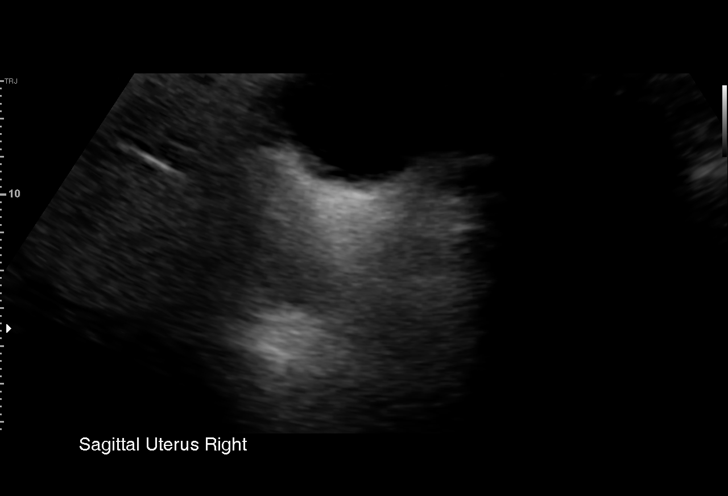
[im 13/43]
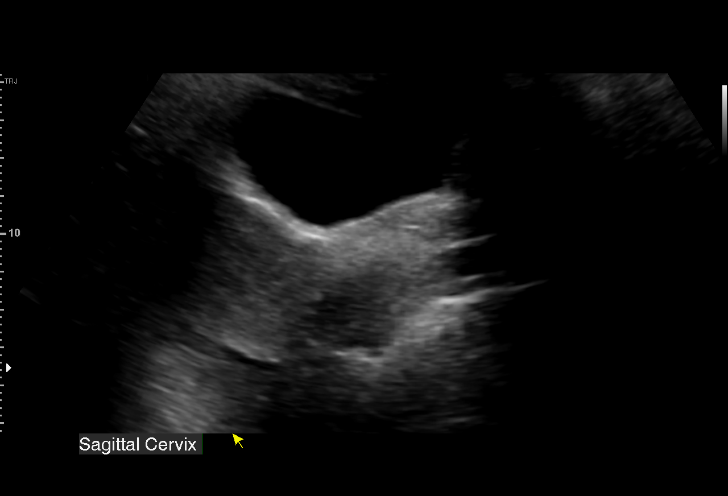
[im 16/43]
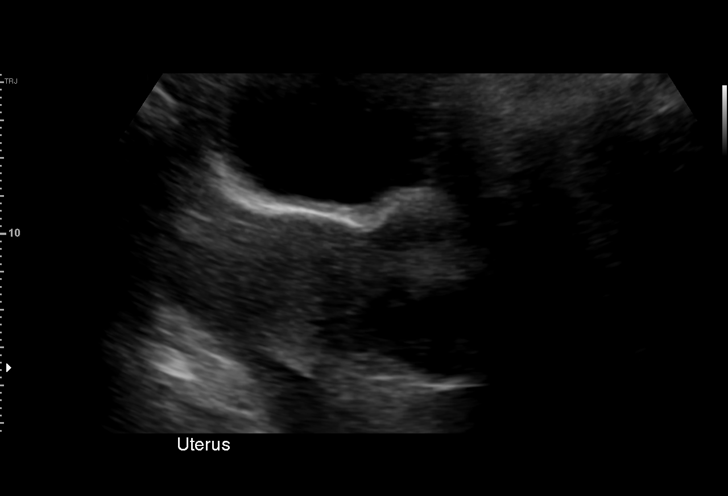
[im 18/43]
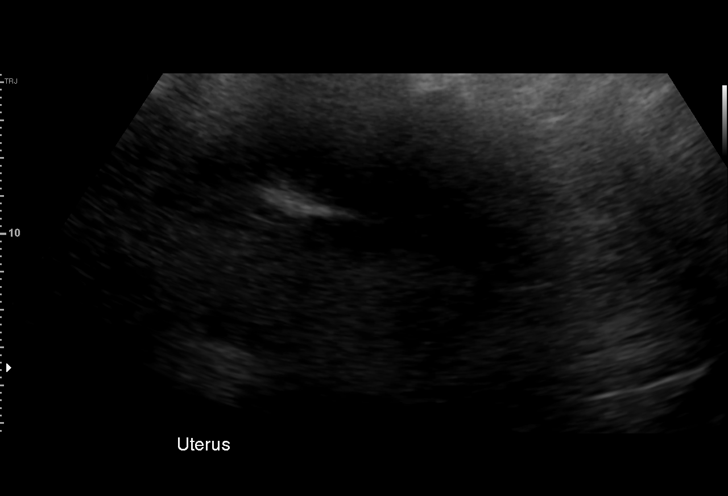
[im 22/43]
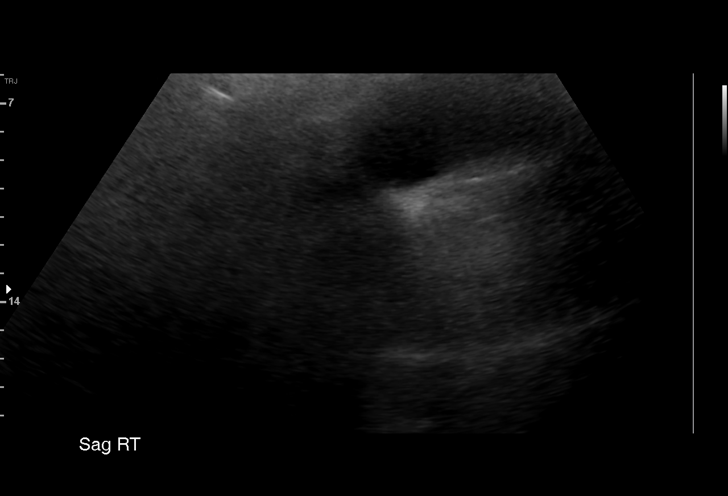
[im 25/43]
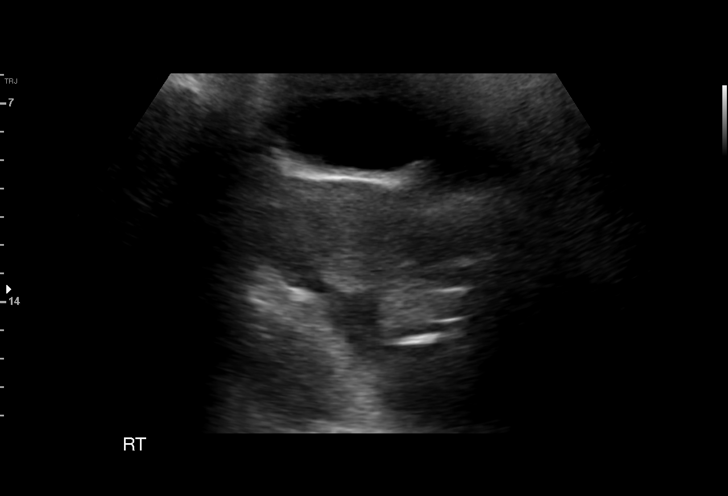
[im 27/43]
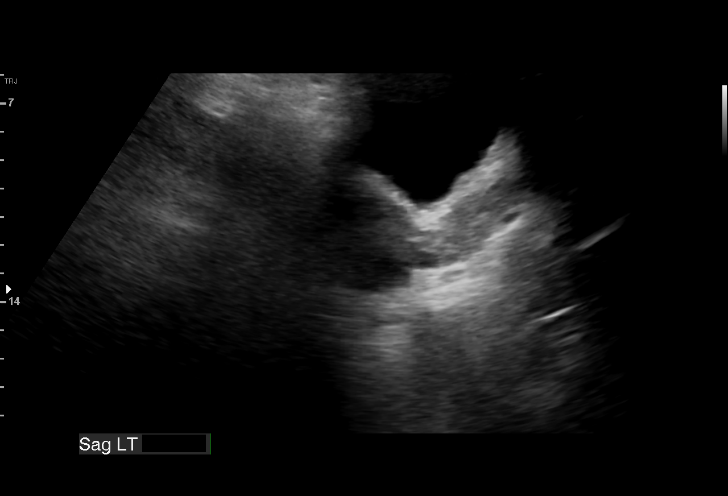
[im 30/43]
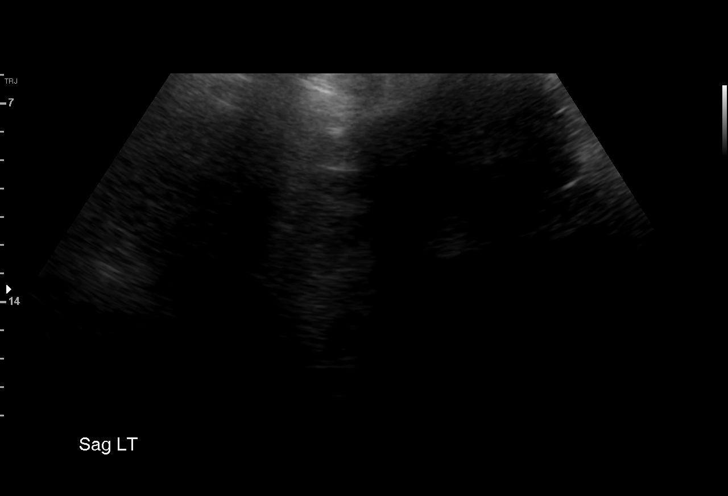
[im 34/43]
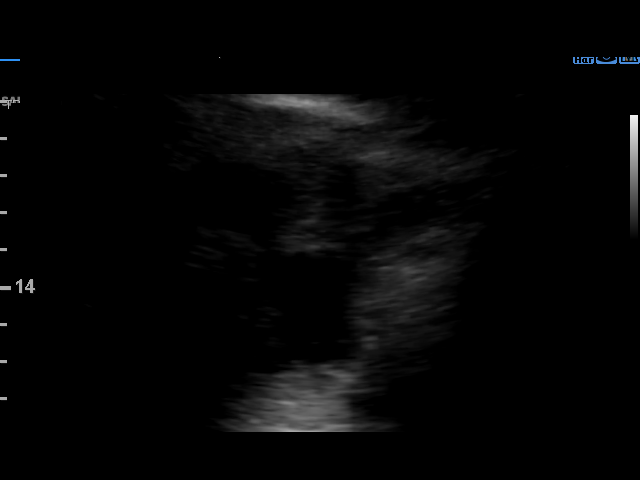
[im 36/43]
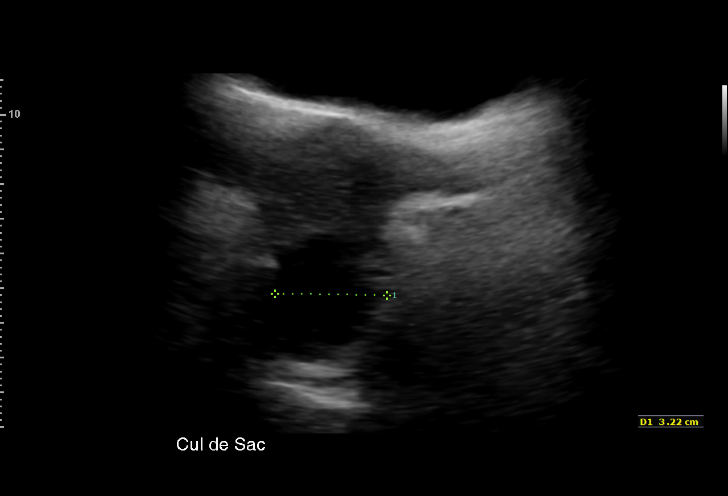
[im 39/43]
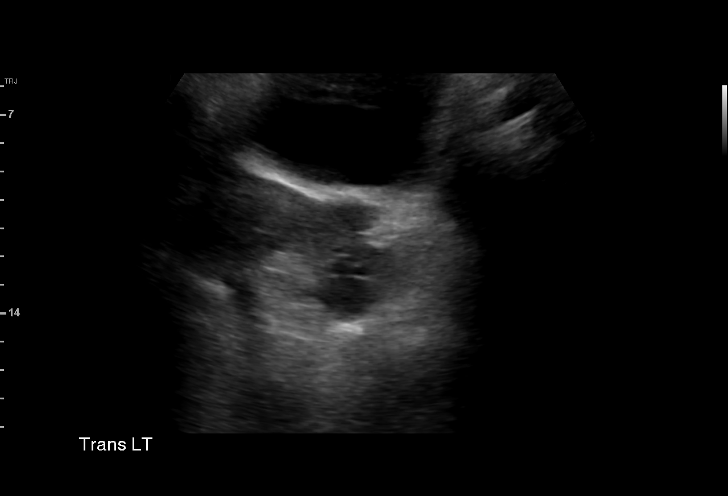
[im 43/43]
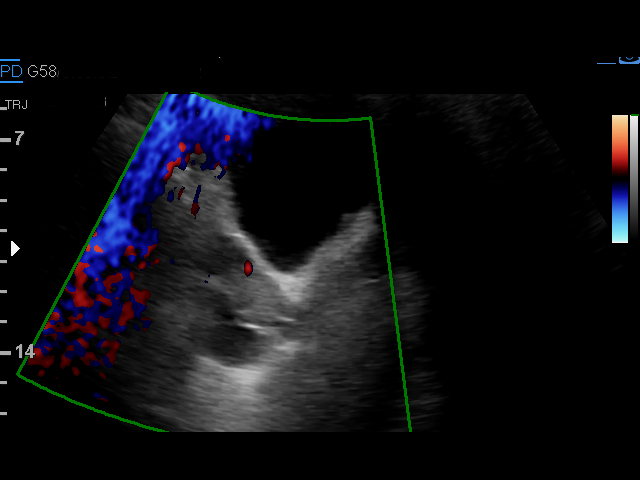

[15 of 25 positions shown; findings below may reference images not displayed]

FINDINGS: Uterus

Measurements: 6.5 x 3.6 x 5.2 cm = volume: 63 mL. Suboptimally
visualized at fundus due to incomplete acoustic window through
bladder. No definite uterine mass

Endometrium

Thickness: 5 mm.  No gross endometrial fluid or focal abnormality

Right ovary

Not visualized on either transabdominal or endovaginal imaging,
likely obscured by bowel

Left ovary

Not definitely visualized

Other findings: Questionable visualization of a complex cystic mass
versus LEFT ovary versus fluid and bowel loops in LEFT adnexa,
incompletely characterized.
IMPRESSION: No significant uterine or endometrial abnormalities.

Nonvisualization of RIGHT ovary with questionable visualization of a
LEFT ovary versus a complex cystic mass in the LEFT adnexa; further
characterization by transvaginal ultrasound imaging recommended to
exclude cystic ovarian neoplasm.

## 2019-05-09 ENCOUNTER — Telehealth: Payer: Self-pay | Admitting: Family Medicine

## 2019-05-09 DIAGNOSIS — R102 Pelvic and perineal pain: Secondary | ICD-10-CM

## 2019-05-09 DIAGNOSIS — N979 Female infertility, unspecified: Secondary | ICD-10-CM

## 2019-05-09 MED ORDER — METRONIDAZOLE 500 MG PO TABS: 2000.0000 mg | ORAL_TABLET | Freq: Once | ORAL | 0 refills | Status: AC

## 2019-05-09 NOTE — Telephone Encounter (Signed)
Patient calls back. Informed.

## 2019-05-09 NOTE — Telephone Encounter (Signed)
Tried to call patient but her voicemail was not set up .  Please inform her of ultrasound on Wed June 24 at 2:45pm.  She needs to drink 32 oz of water, 1 hour before appt and hold her bladder.  This will be at the [facility] on [address].  Tessie Ordaz,CMA

## 2019-05-09 NOTE — Telephone Encounter (Signed)
Called patient back - she does have some occasional vaginal odor. We will treat BV with flagyl 2g x 1 time. She knows that she may feel nauseous with this and will call if this happens.   We need f/u transvag US, team will help [place] schedule. She needs the latest possible appt in the day. Blue team will help schedule.   She brings up that she has had trouble getting pregnant, has been trying for almost 5 years. She didn't mention this the other day. She says she talked to someone here about it once. I will go ahead and place GYN referral for this.

## 2019-05-13 ENCOUNTER — Telehealth (INDEPENDENT_AMBULATORY_CARE_PROVIDER_SITE_OTHER): Payer: Medicaid Other | Admitting: Family Medicine

## 2019-05-13 ENCOUNTER — Other Ambulatory Visit: Payer: Self-pay

## 2019-05-13 DIAGNOSIS — R61 Generalized hyperhidrosis: Secondary | ICD-10-CM

## 2019-05-13 NOTE — Progress Notes (Signed)
Patient needs note in order to return to work. On 05/12/2019 patient was sweating profusely at work and felt overheated.  Patient states that her employer needs a note stating that she is fine and can return to work.  Note should be faxed to [phone] Attention: Lessie Dings.  Ozella Almond, [place]

## 2019-05-15 DIAGNOSIS — R61 Generalized hyperhidrosis: Secondary | ICD-10-CM | POA: Insufficient documentation

## 2019-05-15 NOTE — Assessment & Plan Note (Signed)
Patient with what sounds like a vasovagal reaction.  Likely secondary to dehydration after working in a hot [place] all day, and not hydrating well.  Also did not eat very much is possible she may become hypoglycemic as well.  Gave counseling on making sure that she is well-hydrated and that she takes snacks to work with her in order to not has happen again.  Stated that if this happens again, patient will need more detailed work-up as these symptoms can sometimes be related to an underlying cardiac issue. -Gave note saying okay to return to work -Gave recommendations on staying hydrated and eating before and snacking during work -If happens again will likely need more detailed work-up.

## 2019-05-15 NOTE — Progress Notes (Signed)
[place] Telemedicine Visit  Patient consented to have virtual visit. Method of visit: Telephone  Encounter participants: Patient: Monica Huang - located at home Provider: [facility]upe [facility] - [facility] at [facility]  Chief Complaint: Sweating, felt dizzy at work  HPI: 38 year old female who presents telephone visit.  States that she was at work on 05/12/2019.  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  She states that during a long shift she did not hydrate well, subsequently became a little dizzy, had to sit down for little while, and sweated profusely.  Says that since getting some liquids and eating a little bit she has felt fine since then.  She is interested in a return to work note.  States that she did not hydrate during her shift very well and did not eat very much before shift.  She has felt fine since then and has had no issues.  ROS: per HPI  Pertinent PMHx: None  Exam:  General: Very pleasant female, no acute distress over the phone Respiratory: Able to form clear and coherent sentences with no issues.  No respiratory stress. Psych: Very pleasant, parent thought process.  Assessment/Plan:  Diaphoresis Patient with what sounds like a vasovagal reaction.  Likely secondary to dehydration after working in a hot [place] all day, and not hydrating well.  Also did not eat very much is possible she may become hypoglycemic as well.  Gave counseling on making sure that she is well-hydrated and that she takes snacks to work with her in order to not has happen again.  Stated that if this happens again, patient will need more detailed work-up as these symptoms can sometimes be related to an underlying cardiac issue. -Gave note saying okay to return to work -Gave recommendations on staying hydrated and eating before and snacking during work -If happens again will likely need more detailed work-up.    Time spent during visit with patient: 8  minutes

## 2019-05-21 ENCOUNTER — Other Ambulatory Visit: Payer: Self-pay

## 2019-05-21 ENCOUNTER — Encounter (HOSPITAL_COMMUNITY): Payer: Self-pay

## 2019-05-21 ENCOUNTER — Ambulatory Visit (HOSPITAL_COMMUNITY)
Admission: RE | Admit: 2019-05-21 | Discharge: 2019-05-21 | Disposition: A | Discharge status: Home / Self Care | Payer: Medicaid Other | Source: Ambulatory Visit | Attending: Family Medicine | Admitting: Family Medicine

## 2019-05-21 DIAGNOSIS — R102 Pelvic and perineal pain: Secondary | ICD-10-CM

## 2019-05-21 IMAGING — US TRANSVAGINAL ULTRASOUND OF PELVIS
1 series · 15 of 25 positions shown · non-contrast
Comparison: Prior ultrasound from [DATE].

CLINICAL DATA: Follow-up examination of recent transabdominal
ultrasound with question of left adnexal abnormality.

EXAM:
ULTRASOUND PELVIS TRANSVAGINAL
TECHNIQUE: Transvaginal ultrasound examination of the pelvis was performed
including evaluation of the uterus, ovaries, adnexal regions, and
pelvic cul-de-sac.

[Series 2: transvaginal ultrasound of pelvis · 15 of 29 slices shown]
[im 1/29]
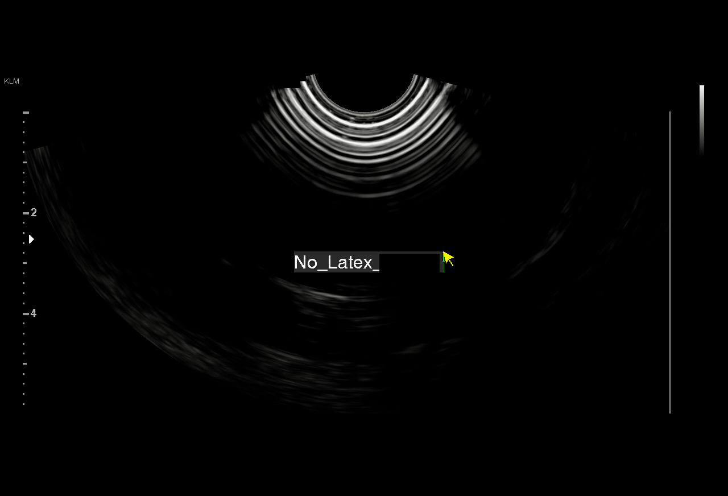
[im 3/29]
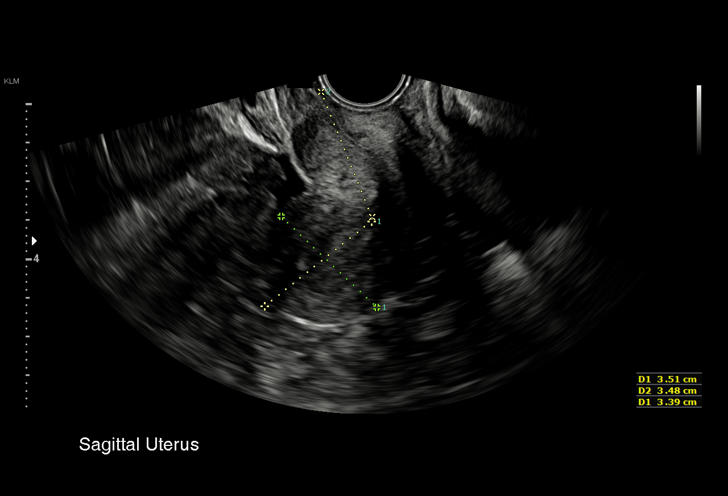
[im 5/29]
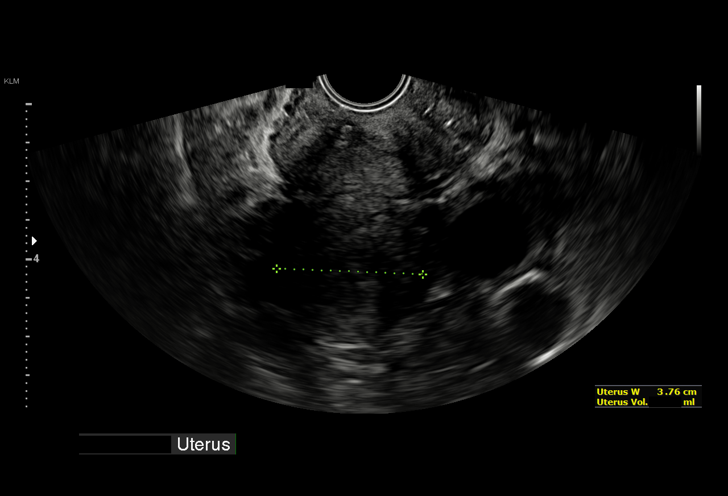
[im 6/29]
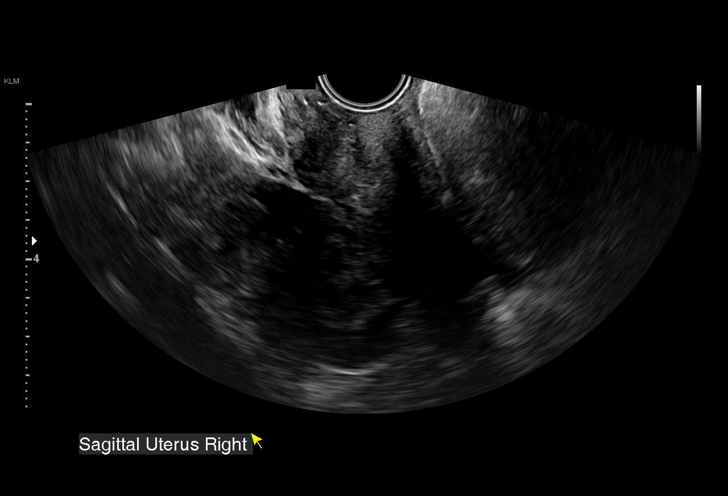
[im 9/29]
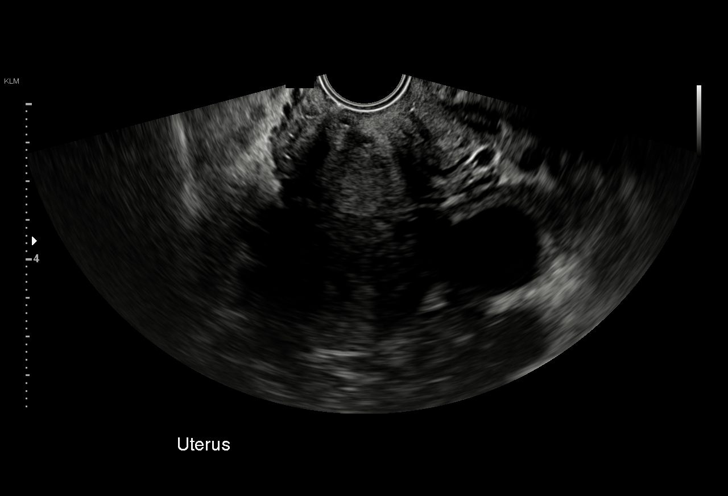
[im 11/29]
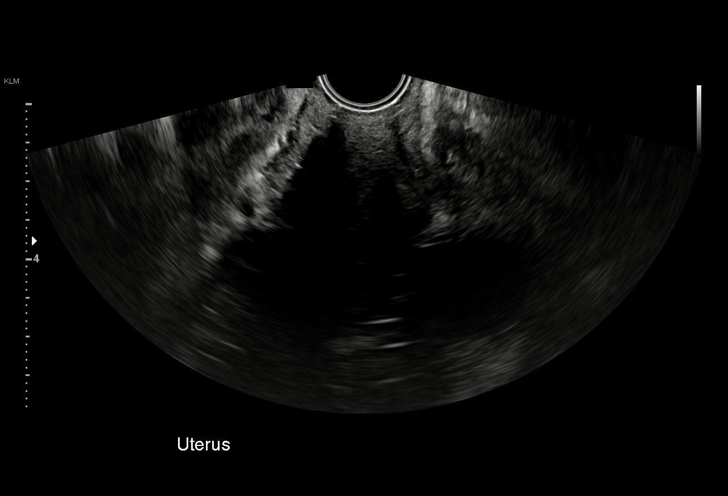
[im 12/29]
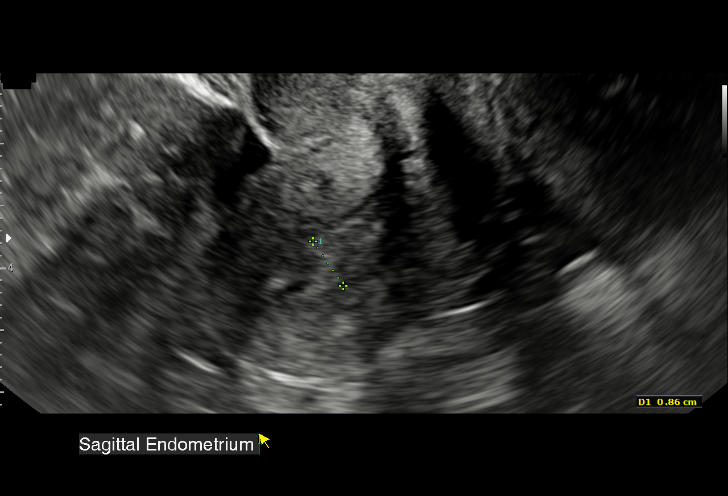
[im 15/29]
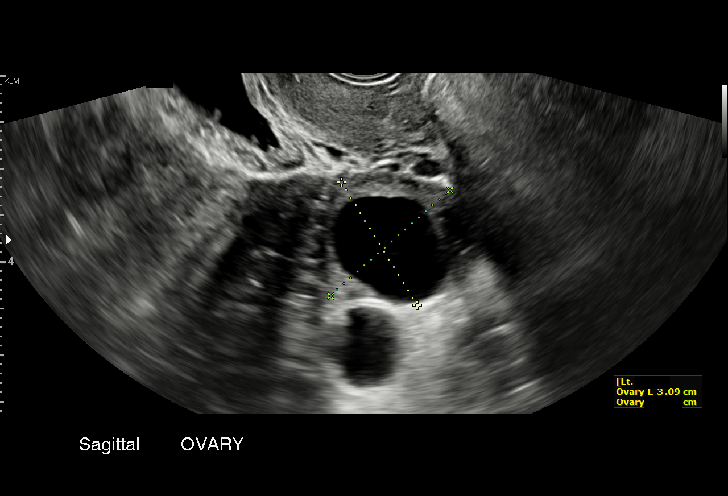
[im 17/29]
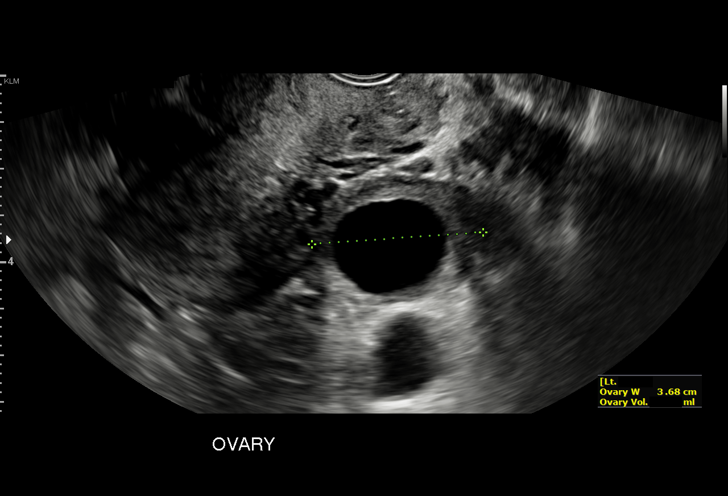
[im 18/29]
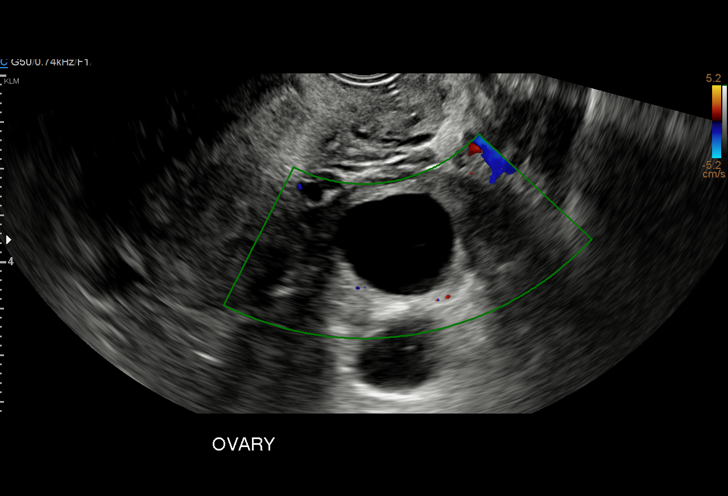
[im 20/29]
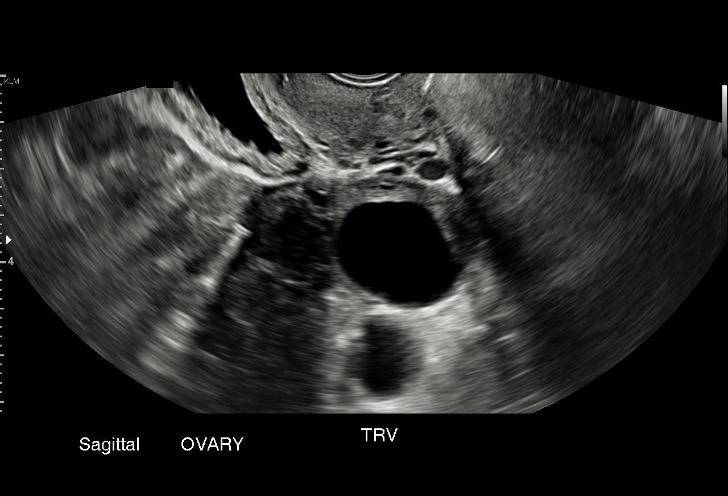
[im 23/29]
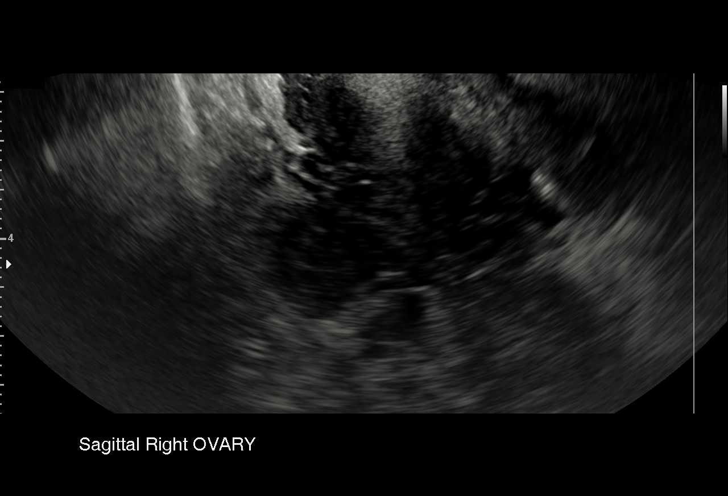
[im 24/29]
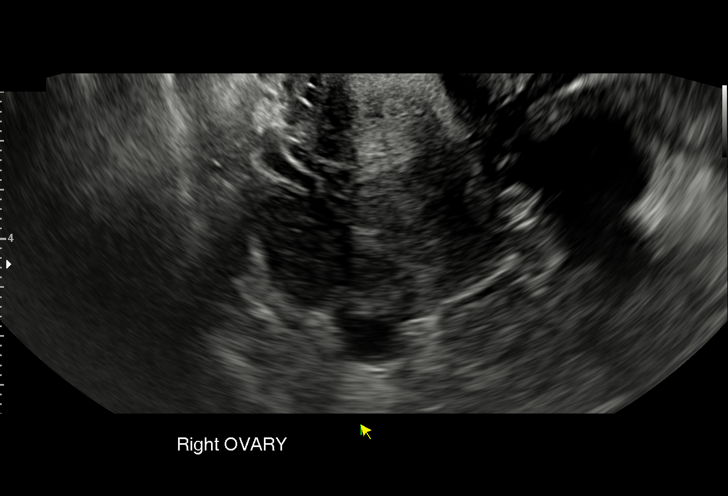
[im 26/29]
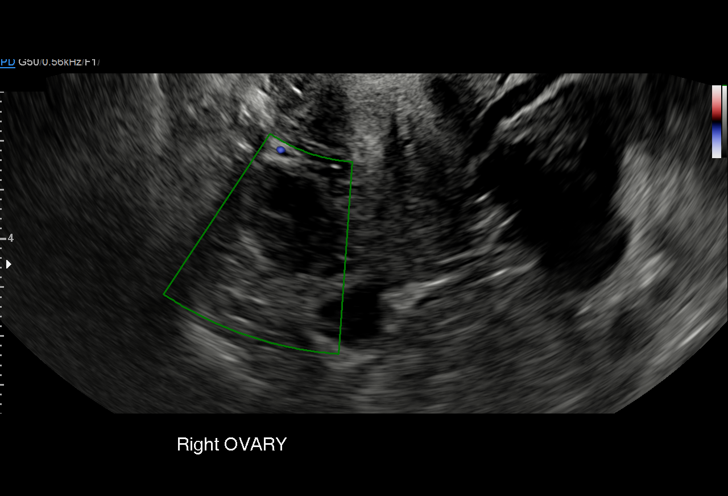
[im 29/29]
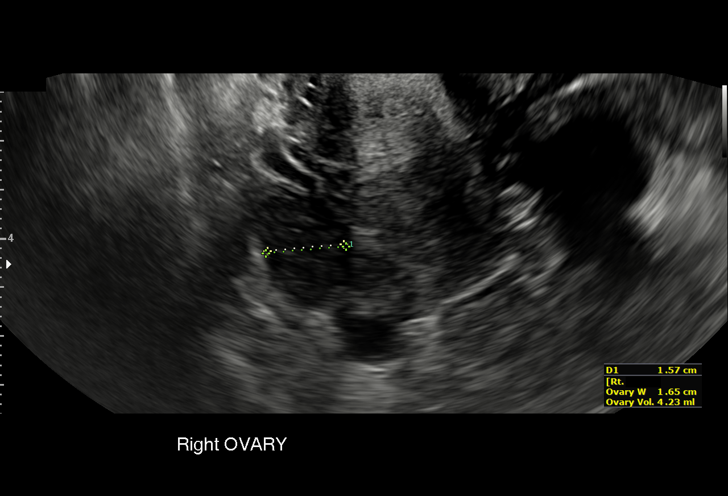

[15 of 25 positions shown; findings below may reference images not displayed]

FINDINGS: Uterus

Measurements: 6.0 x 3.3 x 3.8 cm = volume: 38.7 mL. No fibroids or
other mass visualized.

Endometrium

Thickness: 9 mm.  No focal abnormality visualized.

Right ovary

Measurements: 2.4 x 2.0 x 7.6 cm = volume: 4.2 mL. Normal
appearance/no adnexal mass.

Left ovary

Measurements: 3.1 x 3.4 x 3.7 cm = volume: 20.4 mL. Incidental note
made of a simple anechoic cyst measuring up to 2.7 cm, most
consistent with a normal physiologic follicular cyst/dominant
follicle. No other adnexal mass.

Other findings:  Trace free fluid within the pelvis.
IMPRESSION: 1. 2.7 cm simple left ovarian cyst, most consistent with a normal
physiologic follicular cyst/dominant follicle. Associated trace free
fluid within the pelvis.
2. Otherwise normal transvaginal ultrasound of the pelvis.
Specifically, the ovaries are normal in appearance with no
concerning adnexal mass.
3. Normal sonographic appearance of the uterus and endometrium.

## 2019-05-22 ENCOUNTER — Telehealth: Payer: Self-pay | Admitting: Family Medicine

## 2019-05-22 ENCOUNTER — Other Ambulatory Visit: Payer: Self-pay

## 2019-05-22 ENCOUNTER — Telehealth (INDEPENDENT_AMBULATORY_CARE_PROVIDER_SITE_OTHER): Payer: Medicaid Other | Admitting: Family Medicine

## 2019-05-22 DIAGNOSIS — R42 Dizziness and giddiness: Secondary | ICD-10-CM

## 2019-05-22 DIAGNOSIS — J069 Acute upper respiratory infection, unspecified: Secondary | ICD-10-CM | POA: Diagnosis not present

## 2019-05-22 NOTE — Progress Notes (Signed)
[place] Telemedicine Visit  Patient consented to have virtual visit. Method of visit: Telephone  Encounter participants: Patient: Monica Huang - located at work Provider: [facility] at [facility]  Chief Complaint: Sore throat  HPI: 38 year old female who presents as a telephone visit from work.  Patient states that she has been having sore throat for around 2 days.  States that she developed voice hoarseness this morning.  Patient has not tried anything to help her pain aside from some Chloraseptic spray.  Not taking Tylenol or anti-inflammatory medications.  Patient says that she has noted some increased congestion.  Patient has had mild dry cough.  No fevers.  Patient also says that she is been having intermittent dizziness.  Patient is blind at baseline has a very difficult time describing what she means by this.  She states this is been going on for a few months now and has been worse recently.  Patient says that she is "seeing trees" while at work.  ROS: per HPI  Pertinent PMHx: None  Exam:  General: No acute distress, with hoarseness noted Respiratory: No for clear coherent sentences with no respiratory distress.  No shortness of breath Psych: Appropriate, pleasant  Assessment/Plan:  Viral upper respiratory infection Given patient's time course and history of sore throat is likely due to a viral upper respiratory infection.  Centor criteria is 0, making strep throat very unlikely.  Discussed with patient that it could also be due to allergies given that she may have mucus drainage on the back of her throat.  Recommended hydration, rest, Tylenol, and very sparse ibuprofen if needed.  Also recommended picking up over-the-counter Cepacol lozenges to help with sore throat.  Gave patient excuse to be out of work on 6/25 and 6/26.  Faxed to [phone] Attention: Lessie Dings.  Discussed return precautions with patient, if he is not feeling any  better explained she likely needs to come in for an in person visit on 6/29  Dizziness Patient says that she is having dizziness but has very difficult time describing what this means.  Says is been going on for a few months.  Has had dizziness in the past felt to be consistent with cardiac etiology but she feels this is different this time.  Explained to patient that it would be very difficult for me to work this up over the phone and recommended she come in for an in person visit for dizziness.    Time spent during visit with patient: 19 minutes

## 2019-05-22 NOTE — Telephone Encounter (Signed)
Called patient to relay normal pelvic ultrasound results and follow-up prior pelvic exam.  No answer left voicemail to call back. you can let her know that the ultrasound was normal if she calls, she was worried about this.

## 2019-05-22 NOTE — Assessment & Plan Note (Signed)
Patient says that she is having dizziness but has very difficult time describing what this means.  Says is been going on for a few months.  Has had dizziness in the past felt to be consistent with cardiac etiology but she feels this is different this time.  Explained to patient that it would be very difficult for me to work this up over the phone and recommended she come in for an in person visit for dizziness.

## 2019-05-22 NOTE — Assessment & Plan Note (Signed)
Given patient's time course and history of sore throat is likely due to a viral upper respiratory infection.  Centor criteria is 0, making strep throat very unlikely.  Discussed with patient that it could also be due to allergies given that she may have mucus drainage on the back of her throat.  Recommended hydration, rest, Tylenol, and very sparse ibuprofen if needed.  Also recommended picking up over-the-counter Cepacol lozenges to help with sore throat.  Gave patient excuse to be out of work on 6/25 and 6/26.  Faxed to [phone] Attention: Lessie Dings.  Discussed return precautions with patient, if he is not feeling any better explained she likely needs to come in for an in person visit on 6/29

## 2019-05-26 ENCOUNTER — Encounter: Payer: Self-pay | Admitting: Family Medicine

## 2019-05-26 ENCOUNTER — Other Ambulatory Visit: Payer: Self-pay

## 2019-05-26 ENCOUNTER — Telehealth (INDEPENDENT_AMBULATORY_CARE_PROVIDER_SITE_OTHER): Payer: Medicaid Other | Admitting: Family Medicine

## 2019-05-26 DIAGNOSIS — B9789 Other viral agents as the cause of diseases classified elsewhere: Secondary | ICD-10-CM

## 2019-05-26 DIAGNOSIS — J069 Acute upper respiratory infection, unspecified: Secondary | ICD-10-CM

## 2019-05-26 NOTE — Progress Notes (Signed)
[place] Telemedicine Visit  Patient consented to have virtual visit. Method of visit: Telephone  Encounter participants: Patient: Monica Huang - located at home Provider: Ralene Ok - located at [facility] Others (if applicable): none  Chief Complaint: work return  HPI:  Dr. Kris Mouton took her out of work on 6/25 and she is still having stuffiness and some phlegm. Never had a fever. Sore throat was her main symptoms. Eating and drinking normally now. She feels a lot a better, doesn't feel as tired. Feels maybe 80% better. No hx of respiratory issues.   ROS: per HPI  Pertinent PMHx: pelvic pain   Exam:  Respiratory: speaks easily in long sentences via telephone   Assessment/Plan:  Viral URI: Based on her symptomatology, she is improving.  However, given the ongoing coronavirus pandemic, I have no good way of saying when she can return to work or whether the coronavirus was her etiology of illness without testing.  Referred for testing.  Wrote note and faxed to work to keep her out until test result returns.   At the conclusion of our visit, patient asks about her fertility referral. I spoke with our referral coordinator, she will work on this.   Time spent during visit with patient: 10 minutes  Ralene Ok, MD PGY 3

## 2019-05-27 ENCOUNTER — Telehealth: Payer: Self-pay | Admitting: *Deleted

## 2019-05-27 ENCOUNTER — Other Ambulatory Visit: Payer: Self-pay | Admitting: Internal Medicine

## 2019-05-27 ENCOUNTER — Other Ambulatory Visit: Payer: Self-pay

## 2019-05-27 DIAGNOSIS — Z20822 Contact with and (suspected) exposure to covid-19: Secondary | ICD-10-CM

## 2019-05-27 NOTE — Telephone Encounter (Signed)
-----   Message from Sela Hilding, MD sent at 05/26/2019  3:13 PM EDT ----- Please test this patient for [place], she works at [employer] for [employer] and there has been a recent outbreak. She is on day 5 of sneezing, cough, mucous production. She is Blind and her fiance drives her.  Dr. Lindell Noe

## 2019-05-27 NOTE — Telephone Encounter (Signed)
Pt scheduled for covid testing today @ 2:15 @ [facility]. Instructions given and order placed.

## 2019-06-02 ENCOUNTER — Telehealth: Payer: Self-pay | Admitting: Family Medicine

## 2019-06-02 LAB — NOVEL CORONAVIRUS, NAA: SARS-CoV-2, NAA: NOT DETECTED

## 2019-06-02 NOTE — Telephone Encounter (Signed)
Pt is calling to check on the status of her covid results. She is not able to return to work until she has the results.   Please call when results are available.

## 2019-06-02 NOTE — Telephone Encounter (Signed)
Pt is returning Dr. Erskine Speed- Lott's phone call. Please call back again with results.

## 2019-06-03 NOTE — Telephone Encounter (Signed)
Red team please print these out and place them up for her thank you.

## 2019-06-09 ENCOUNTER — Other Ambulatory Visit: Payer: Self-pay

## 2019-06-09 ENCOUNTER — Ambulatory Visit
Admission: RE | Admit: 2019-06-09 | Discharge: 2019-06-09 | Disposition: A | Discharge status: Home / Self Care | Payer: Medicaid Other | Source: Ambulatory Visit | Attending: Family Medicine | Admitting: Family Medicine

## 2019-06-09 ENCOUNTER — Ambulatory Visit (INDEPENDENT_AMBULATORY_CARE_PROVIDER_SITE_OTHER): Payer: Medicaid Other | Admitting: Family Medicine

## 2019-06-09 VITALS — BP 130/80 | HR 90

## 2019-06-09 DIAGNOSIS — M25562 Pain in left knee: Secondary | ICD-10-CM

## 2019-06-09 DIAGNOSIS — M79641 Pain in right hand: Secondary | ICD-10-CM

## 2019-06-09 IMAGING — CR BILATERAL KNEES STANDING - 1 VIEW
1 series · 1 of 1 positions shown · non-contrast
Comparison: None.

CLINICAL DATA: Left knee pain around patella.

EXAM:
BILATERAL KNEES STANDING - 1 VIEW

[w knees ap bilat]
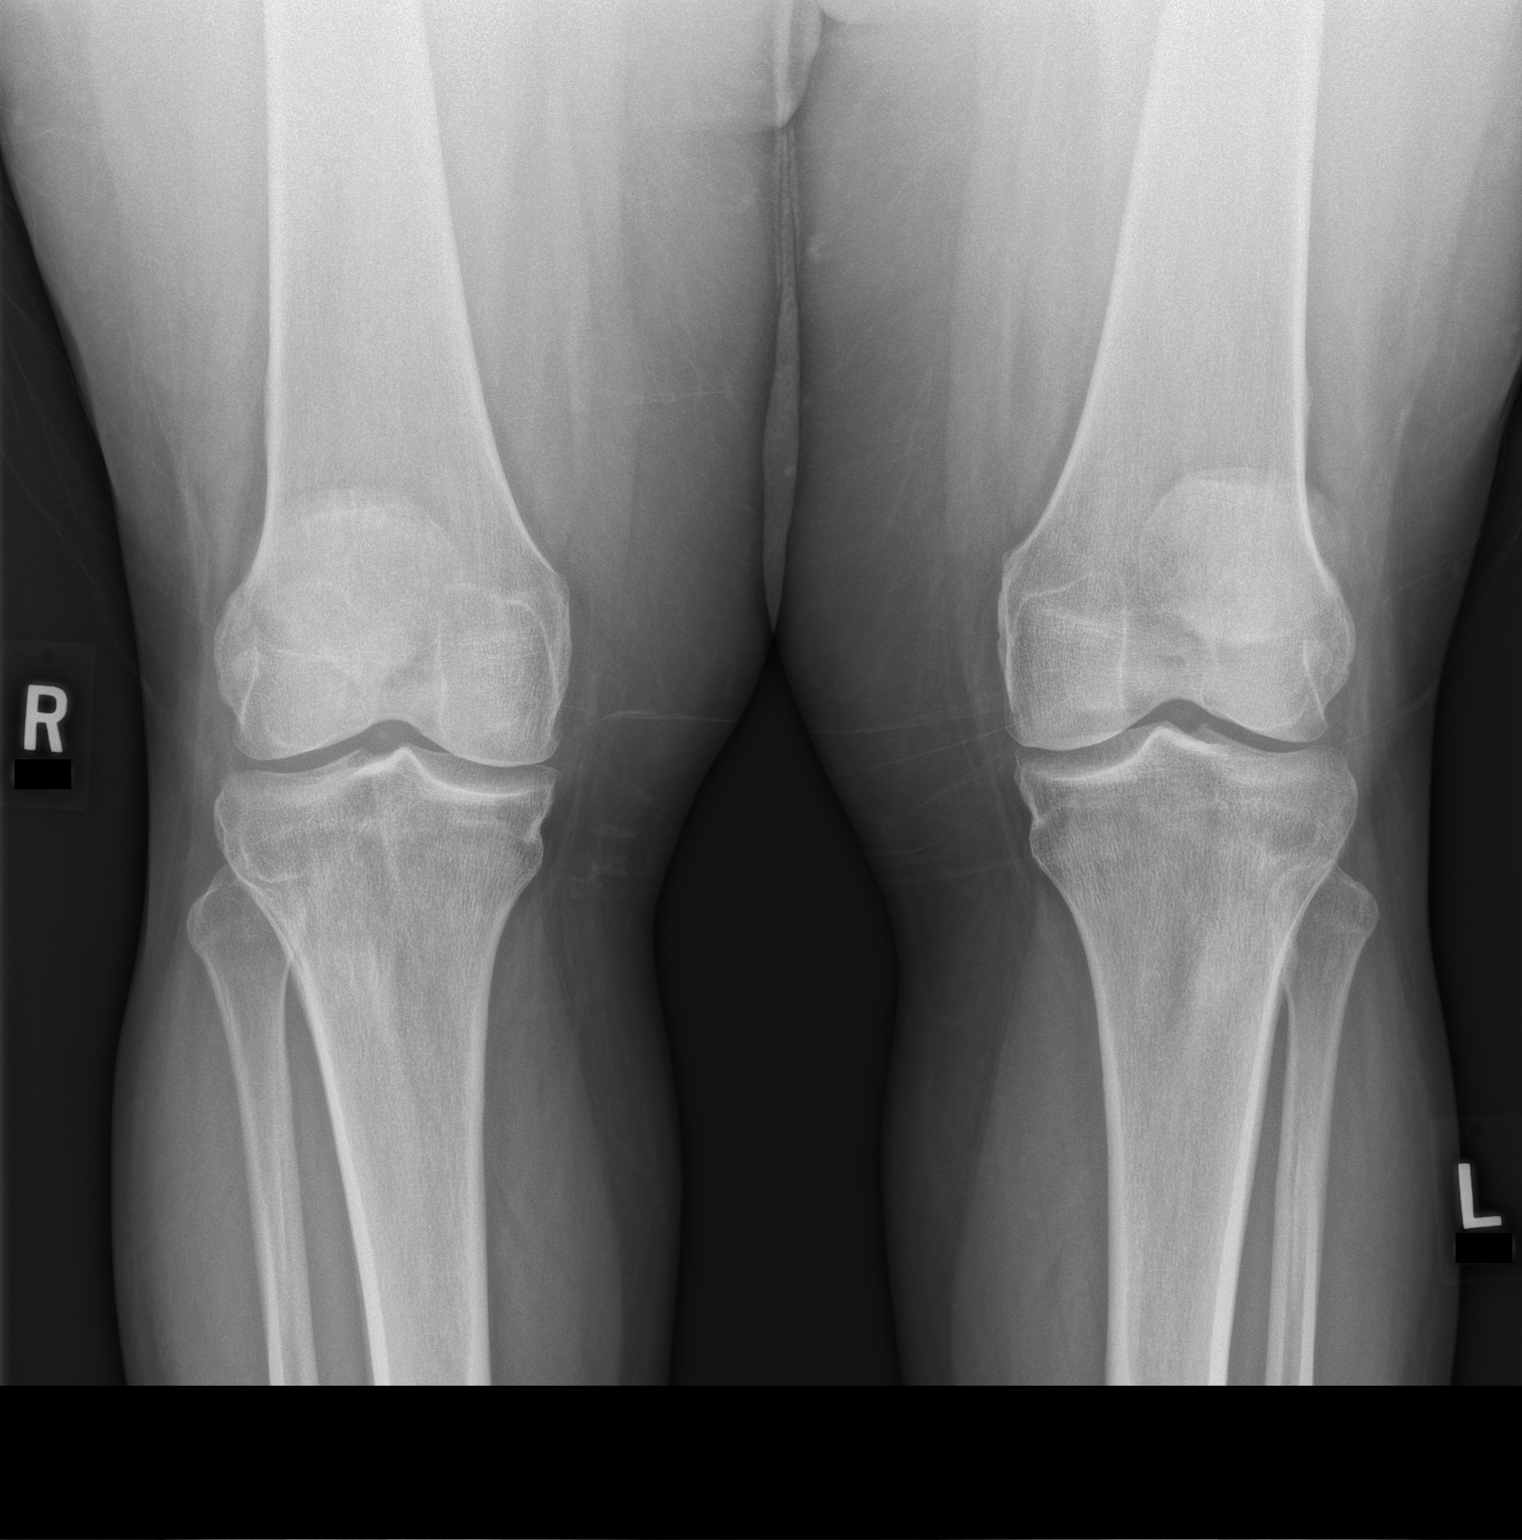

[1 of 1 positions shown; findings below may reference images not displayed]

FINDINGS: No evidence of fracture, dislocation, or joint effusion. No evidence
of arthropathy or other focal bone abnormality. Soft tissues are
unremarkable.
IMPRESSION: Negative.

## 2019-06-09 MED ORDER — MELOXICAM 15 MG PO TABS: 15.0000 mg | ORAL_TABLET | Freq: Every day | ORAL | 0 refills | Status: DC

## 2019-06-09 NOTE — Patient Instructions (Signed)
It was great balancing you in person today!  I am sorry you had so much trouble with your left knee.  Report ultrasound and did not see any obvious diagnoses for your symptoms.  The next step is to get a knee x-ray to better evaluate the bones of the knee.  You can walk-in to have this done at [place] on [facility].  I placed the order already.  In the meantime is a good idea to take meloxicam 15 mg daily for anti-inflammatory.  I would also continue to alternate ice and heat on that knee.  I think your right hand pain has to do with a little muscle strain of the muscles of the right hand.  The anti-inflammatory should help a lot with this as well.

## 2019-06-10 ENCOUNTER — Encounter: Payer: Self-pay | Admitting: Family Medicine

## 2019-06-10 DIAGNOSIS — M25562 Pain in left knee: Secondary | ICD-10-CM | POA: Insufficient documentation

## 2019-06-10 NOTE — Assessment & Plan Note (Signed)
Very odd presentation.  Has tremendous pain but no discernible traumatic injury to the area.  Limited ultrasound did not reveal any obvious pathology.  Does have notable swelling but no fluid wave can be appreciated.  Differential for pain is broad.  Got x-rays to rule out bony fracture and arthritis.  Bilateral x-rays were negative for any signs of these problems.  The tear many items in the differential are a soft tissue injury or perhaps a DVT.  Given the lack of fullness in the posterior knee I do not think a Baker's cyst is likely.  Given the urgent nature of diagnosis for a DVT we will get a left lower extremity Doppler to rule this out.  This also give [place] a better idea of her posterior knee anatomy.  If DVT is negative and patient does not improve with meloxicam, the next up likely be MRI to better evaluate.

## 2019-06-10 NOTE — Assessment & Plan Note (Signed)
Patient with mild tenderness to thenar muscle palpation.  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  Patient to rest when possible, use heat and ice.  Patient taking likely have her knee problem discussed above.  This will likely help as well.

## 2019-06-10 NOTE — Progress Notes (Signed)
HPI 38 year old African-American female who presents for left knee pain.  Patient states this pain started about a week ago.  There is no known trauma or any inciting event.  She woke up with the pain she says.  She says the pain is exquisite, saying that is not too bad at rest but if she tries to bend her knee or walk it is very very painful.  Pain is located primarily around her patella.  Besides putting no weight on it and no activity she is found nothing else that helps it.  She is tried no medications for this aside from some Tylenol which did not help.  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  Uses all sorts of different shoes for work. Patient has not had any similar knee pain.  Patient also presents with right hand pain.  States that she usually develops after work.  Has tried nothing for the pain.  I followed up with patient on the phone on 7/14.  He has endorsed some improvement with the meloxicam but still has a lot of trouble bending her knee.  CC: Left knee pain   ROS:   Review of Systems See HPI for ROS.   CC, SH/smoking status, and VS noted  Objective: BP 130/80   Pulse 90   LMP 05/12/2019 (Approximate)   SpO2 71%  Gen: 38 year old African female, in obvious pain from left knee, very pleasant HEENT: Blind at baseline CV: RRR, no murmur Resp: CTAB, no wheezes, non-labored Neuro: Alert and oriented, Speech clear, No gross deficits  Right hand Inspection: No asymmetry noted Palpation: Very mild tenderness to thenar muscles right hand Range of motion: Range of motion full intact Strength: 5/5 strength grip, lumbricals, finger abductors, adductors Neurovascular: Sensation intact, palpable radial ulnar pulse.  Left knee Inspection: Notable swelling and asymmetry from left knee to right knee Palpation: Very tender at bony prominence of patella.  No palpation posterior knee.  Very mild palpation patellar tendon and quadriceps tendon.  Very minor  fluid wave appreciated. Range of motion: Range of motion is intact, but patient exhibits very notable pain and slowed movement. Strength: 5/5 strength quadriceps and hamstrings Neurovascular: Sensation intact, skin warm and dry Special test: Negative McMurray, Lockman, anterior drawer, posterior drawer.  limited somewhat by pain  Ultrasound performed with assistance from Dr. Kathrynn Speed, sports medicine fellow.  No appreciable fluid collection appreciated.  No cortical interruption consistent with bony fracture.  No finding consistent with meniscal tear or ligamentous injury.  Assessment and plan:  Left knee pain Very odd presentation.  Has tremendous pain but no discernible traumatic injury to the area.  Limited ultrasound did not reveal any obvious pathology.  Does have notable swelling but no fluid wave can be appreciated.  Differential for pain is broad.  Got x-rays to rule out bony fracture and arthritis.  Bilateral x-rays were negative for any signs of these problems.  The tear many items in the differential are a soft tissue injury or perhaps a DVT.  Given the lack of fullness in the posterior knee I do not think a Baker's cyst is likely.  Given the urgent nature of diagnosis for a DVT we will get a left lower extremity Doppler to rule this out.  This also give [place] a better idea of her posterior knee anatomy.  If DVT is negative and patient does not improve with meloxicam, the next up likely be MRI to better evaluate.  Right hand  pain Patient with mild tenderness to thenar muscle palpation.  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  Patient to rest when possible, use heat and ice.  Patient taking likely have her knee problem discussed above.  This will likely help as well.   Orders Placed This Encounter  Procedures  . DG Knee AP/LAT W/Sun[place] Left    Standing Status:   Future    Number of Occurrences:   1    Standing Expiration Date:   08/09/2020    Order Specific  Question:   Reason for Exam (SYMPTOM  OR DIAGNOSIS REQUIRED)    Answer:   knee pain    Order Specific Question:   Is patient pregnant?    Answer:   No    Order Specific Question:   Preferred imaging location?    Answer:   [facility]    Order Specific Question:   Radiology Contrast Protocol - do NOT remove file path    Answer:   \\[email or web address]\Radiant\DXFluoroContrastProtocols.pdf  . DG Knee Bilateral Standing AP    Standing Status:   Future    Number of Occurrences:   1    Standing Expiration Date:   08/09/2020    Order Specific Question:   Reason for Exam (SYMPTOM  OR DIAGNOSIS REQUIRED)    Answer:   knee pain    Order Specific Question:   Is patient pregnant?    Answer:   No    Order Specific Question:   Preferred imaging location?    Answer:   [facility]    Order Specific Question:   Radiology Contrast Protocol - do NOT remove file path    Answer:   \\[email or web address]\Radiant\DXFluoroContrastProtocols.pdf    Meds ordered this encounter  Medications  . meloxicam (MOBIC) 15 MG tablet    Sig: Take 1 tablet (15 mg total) by mouth daily.    Dispense:  14 tablet    Refill:  0   Myrene BuddyJacob Orah Sonnen MD PGY-3 Family Medicine Resident  06/10/2019 4:11 PM

## 2019-06-12 ENCOUNTER — Other Ambulatory Visit: Payer: Self-pay | Admitting: Family Medicine

## 2019-06-12 NOTE — Progress Notes (Signed)
I placed an order for a dvt scan in the patient's last clinic visit. Can we get this scheduled for the patient? I do not think it is an emergency but would like it scheduled at first availability.  Guadalupe Dawn MD PGY-2 Family Medicine Resident

## 2019-06-12 NOTE — Addendum Note (Signed)
Addended by: Pauletta Browns on: 06/12/2019 10:42 AM   Modules accepted: Orders

## 2019-06-13 ENCOUNTER — Telehealth: Payer: Self-pay

## 2019-06-13 NOTE — Telephone Encounter (Signed)
Spoke to [place] at [facility] lab and she has already scheduled patient and spoken to her.  Monica Huang, [place]

## 2019-06-18 ENCOUNTER — Ambulatory Visit (HOSPITAL_COMMUNITY): Payer: Medicaid Other

## 2019-06-20 ENCOUNTER — Ambulatory Visit (HOSPITAL_COMMUNITY)
Admission: RE | Admit: 2019-06-20 | Discharge: 2019-06-20 | Disposition: A | Discharge status: Home / Self Care | Payer: Medicaid Other | Source: Ambulatory Visit | Attending: Family Medicine | Admitting: Family Medicine

## 2019-06-20 ENCOUNTER — Other Ambulatory Visit: Payer: Self-pay

## 2019-06-20 ENCOUNTER — Telehealth: Payer: Self-pay | Admitting: Family Medicine

## 2019-06-20 DIAGNOSIS — M25562 Pain in left knee: Secondary | ICD-10-CM | POA: Diagnosis present

## 2019-06-20 NOTE — Telephone Encounter (Signed)
Needs to give [place] a report on L. lower extremity venous duplex

## 2019-06-20 NOTE — Telephone Encounter (Signed)
Returned call to [facility]- negative LE doppler. Routing to ordering provider to discuss further with patient.   Dorris Singh, MD  Family Medicine Teaching Service

## 2019-06-20 NOTE — Progress Notes (Signed)
Left lower extremity venous duplex has been completed. Preliminary results can be found in CV Proc through chart review.  Results were given to Dr. Owens Shark.  06/20/19 2:30 PM Monica Huang RVT

## 2019-06-25 ENCOUNTER — Other Ambulatory Visit: Payer: Self-pay

## 2019-06-25 ENCOUNTER — Telehealth (INDEPENDENT_AMBULATORY_CARE_PROVIDER_SITE_OTHER): Payer: Medicaid Other | Admitting: Family Medicine

## 2019-06-25 ENCOUNTER — Telehealth: Payer: Self-pay | Admitting: Family Medicine

## 2019-06-25 ENCOUNTER — Encounter: Payer: Self-pay | Admitting: Family Medicine

## 2019-06-25 DIAGNOSIS — Z20828 Contact with and (suspected) exposure to other viral communicable diseases: Secondary | ICD-10-CM | POA: Insufficient documentation

## 2019-06-25 DIAGNOSIS — Z20822 Contact with and (suspected) exposure to covid-19: Secondary | ICD-10-CM | POA: Insufficient documentation

## 2019-06-25 NOTE — Telephone Encounter (Signed)
Pt called to give Dr.Mahoney a fax number of [phone] for her job.

## 2019-06-25 NOTE — Assessment & Plan Note (Addendum)
Pt had possible exposure to COVID-19 (+) person, was informed 7/21 - Pt called friend and COVID-19 test was negative, per pt - Pt may return to work after 10 days of exposure, symptom free for 24 hours - Letter sent to pt's employer that pt may return to work on 06/27/2019

## 2019-06-25 NOTE — Progress Notes (Signed)
[place] Telemedicine Visit  Patient consented to have virtual visit. Method of visit: Video  Encounter participants: Patient: Monica Huang - located at home  Provider: Gladys Damme - located at [facility] clinic   Chief Complaint: note for work s/p possible COVID-19 exposure  HPI:  Monica Huang is a 38 yo woman who presents today seeking consultation for return to work after possible COVID-19 exposure. On 06/17/2019 Monica Huang's friend called her to inform her that the friend had been exposed to a (+) COVID-19 case. The friend did not have sx at the time and COVID-19 status is unknown (Monica Huang will call to find out). Monica Huang was instructed to stay out of work for at least a week and needs a note to return to work. Monica Huang also had negative COVID-19 test in June, but that was prior to this exposure. Monica Huang has a runny nose, but this is likely her chronic allergic rhinitis and not out of her usual state. She denies HA, fever, chills, cough, sore throat, SOB, CP, n/v/d.  ROS: per HPI  Pertinent PMHx: blindness, HTN, CSF communicating shunt, hidradenitis  Exam:  Respiratory: normal respiratory effort observed via video  Assessment/Plan: Monica Huang is a 38 yo woman with possible COVID-19 exposure  Close Exposure to Covid-19 Virus Monica Huang had possible exposure to COVID-19 (+) person, was informed 7/21 - Monica Huang called friend and COVID-19 test was negative, per Monica Huang - Monica Huang may return to work after 10 days of exposure, symptom free for 24 hours - Letter sent to Monica Huang's employer that Monica Huang may return to work on 06/27/2019    Time spent during visit with patient: 11 minutes

## 2019-06-25 NOTE — Telephone Encounter (Signed)
Pt called and wanted to inform PCP that her friend's Covid results were negative. Please give pt a call back.

## 2019-06-25 NOTE — Progress Notes (Signed)
285lb bp- n/a/ t- no fever  [id number] - [place], [place] - [address]   Pt gave consent to telephone visit. Salvatore Marvel, CMA

## 2019-07-04 ENCOUNTER — Other Ambulatory Visit: Payer: Self-pay

## 2019-07-04 ENCOUNTER — Ambulatory Visit (INDEPENDENT_AMBULATORY_CARE_PROVIDER_SITE_OTHER): Payer: Medicaid Other | Admitting: Student in an Organized Health Care Education/Training Program

## 2019-07-04 VITALS — BP 122/88 | HR 89 | Wt 292.4 lb

## 2019-07-04 DIAGNOSIS — G629 Polyneuropathy, unspecified: Secondary | ICD-10-CM

## 2019-07-04 DIAGNOSIS — M542 Cervicalgia: Secondary | ICD-10-CM

## 2019-07-04 DIAGNOSIS — R7303 Prediabetes: Secondary | ICD-10-CM

## 2019-07-04 DIAGNOSIS — M25519 Pain in unspecified shoulder: Secondary | ICD-10-CM

## 2019-07-04 LAB — POCT GLYCOSYLATED HEMOGLOBIN (HGB A1C): HbA1c, POC (controlled diabetic range): 5.8 % (ref 0.0–7.0)

## 2019-07-04 NOTE — Patient Instructions (Signed)
It was a pleasure to see you today!  To summarize our discussion for this visit:  For your shoulder impingement, I am referring you to physical therapy and orthopedic surgery for a consultation.  I am also providing you with documentation encouraging use of gym facilities.   Some additional health maintenance measures we should update are: Health Maintenance Due  Topic Date Due  . PNEUMOCOCCAL POLYSACCHARIDE VACCINE AGE 37-64 HIGH RISK  07/18/1984  . HIV Screening  07/18/1997  . OPHTHALMOLOGY EXAM  06/03/2015  . FOOT EXAM  02/16/2017  . INFLUENZA VACCINE  06/28/2019  .     Call the clinic at [phone] if your symptoms worsen or you have any concerns.   Thank you for allowing me to take part in your care,  Dr. Doristine Mango

## 2019-07-04 NOTE — Progress Notes (Signed)
   Subjective:    Patient ID: Monica Huang, female    DOB: 12-18-1980, 38 y.o.   MRN: [id number]   CC: shoulder pain  HPI:  Patient presents today with left shoulder pain that began earlier this year.  She was seen in the ED and received x-ray of her shoulder and neck which showed arthritis per patient.  She was prescribed a muscle relaxant at that time which did not improve her symptoms.  She states that the pain is at all times throughout the day and has not noticed certain activities exacerbate the pain.  It can sometimes hurt when she is not moving at all.  She has not been exercising.  She has not gone to physical therapy.  She is seen at sports medicine for her knee pain and they recommended that she see her regular doctor for her shoulder.  Patient can point with 1 finger to the pain on the lateral deltoid.  She states that the pain radiates towards her cervical spine.  Denies any radiating numbness or tingling down her arm.  Has not had any weakness or dropping objects from her hand.  She is still able to have full range of motion with her shoulder, however, it is painful.  She has chronic tingling in her fingers and toes.  She thinks that this is due to her blood sugars as she took metformin in the past.  A1c today is 5.8.  Patient has gained weight since last measurement 01-2019.   Smoking status reviewed   ROS: pertinent noted in the HPI   Past medical history, surgical, family, and social history reviewed and updated in the EMR as appropriate.  Objective:  BP 122/88   Pulse 89   Wt 292 lb 6.4 oz (132.6 kg)   SpO2 100%   BMI 44.46 kg/m   Vitals and nursing note reviewed  General: NAD, pleasant, able to participate in exam Shoulder: Patient has full passive range of motion of left shoulder.  She has increased pain with adduction against resistance.  The pain is located in her left lateral deltoid.  The pain does not radiate down her arm.  There is tenderness to palpation over  the deltoid diffusely.  Extremities: no edema or cyanosis. Skin: warm and dry, no rashes noted Neuro: alert, no obvious focal deficits Psych: Normal affect and mood   Assessment & Plan:    Neck and shoulder pain Patient with point tenderness to palpation and with abduction.  Worsened with abduction against resistance. Prior x-ray showed no bony abnormalities to shoulder or neck.  Mild degenerative disc disease in C5-6. Referred patient to physical therapy. Referred to orthopedic surgery for consultation. Consider impingement in the joint of nerve or tissue-would likely require MRI for final diagnosis  Hemoglobin A1c 5.8 today which is increased from last.   Doristine Mango, [place] PGY-2

## 2019-07-04 NOTE — Assessment & Plan Note (Addendum)
Patient with point tenderness to palpation and with abduction.  Worsened with abduction against resistance. Prior x-ray showed no bony abnormalities to shoulder or neck.  Mild degenerative disc disease in C5-6. Does have history of cervical radiculitis and carpal tunnel syndrome Referred patient to physical therapy. Referred to orthopedic surgery for consultation. Consider impingement in the joint of nerve or tissue-would likely require MRI for final diagnosis

## 2019-07-18 ENCOUNTER — Ambulatory Visit (INDEPENDENT_AMBULATORY_CARE_PROVIDER_SITE_OTHER): Payer: Medicaid Other | Admitting: Orthopedic Surgery

## 2019-07-18 ENCOUNTER — Encounter: Payer: Self-pay | Admitting: Orthopedic Surgery

## 2019-07-18 DIAGNOSIS — M5412 Radiculopathy, cervical region: Secondary | ICD-10-CM | POA: Diagnosis not present

## 2019-07-20 ENCOUNTER — Encounter: Payer: Self-pay | Admitting: Orthopedic Surgery

## 2019-07-20 NOTE — Progress Notes (Signed)
Office Visit Note   Patient: Monica MiresCrystal Huang           Date of Birth: 12/28/1980           MRN: [id number] Visit Date: 07/18/2019 Requested by: Doreene ElandEniola, Kehinde T, MD [address] [place] [id number] PCP: Lavonda JumboAutry-Lott, Simone, DO  Subjective: Chief Complaint  Patient presents with   Neck - Pain    HPI: Monica Huang is a 38 y.o. female who presents to the office complaining of left shoulder and neck pain.  She localizes her pain to the neck, shoulder blade, left trap.  Patient states that her symptoms in 6 months ago, gradually.  She notes neck pain with radiation to her left shoulder and all the way down her arm into her fingertips.  She reports numbness and tingling of the index, middle, ring, little fingers on the palmar aspect.  Her symptoms wake her up at night.  Patient has noticed difficulty with fine motor skills, occasionally dropping small objects by accident.  She has tried Voltaren gel, ibuprofen, home exercise program without relief.  She works as a Designer, industrial/productsewing machine operator.  She has never had an MRI of her neck/shoulder, injections of her neck/shoulder, surgery of her neck/shoulder.  She does not take any blood thinners.  She has a history of visual impairment.              ROS:  All systems reviewed are negative as they relate to the chief complaint within the history of present illness.  Patient denies fevers or chills.  Assessment & Plan: Visit Diagnoses:  1. Radiculopathy, cervical region     Plan: Patient is a 38 year old female who presents with a six-month history of worsening left-sided neck pain with radicular symptoms.  I suspect that her symptoms are coming from disc pathology of the C-spine.  X-rays of the C-spine from 02/27/2019 were reviewed and revealed disc degenerative changes at C5-C6.  She has tried anti-inflammatories and home exercises over the past 6 months without improvement.  Recommended MRI of the C-spine to evaluate for left-sided  radiculopathy.  Patient agrees with the plan.  Plan for ESI's of the C-spine based on MRI results.  Patient is not taking any blood thinners.  Patient will follow after MRI to review results.  Follow-Up Instructions: No follow-ups on file.   Orders:  Orders Placed This Encounter  Procedures   MR Cervical Spine w/o contrast   No orders of the defined types were placed in this encounter.     Procedures: No procedures performed   Clinical Data: No additional findings.  Objective: Vital Signs: There were no vitals taken for this visit.  Physical Exam:  Constitutional: Patient appears well-developed HEENT:  Head: Normocephalic Eyes:EOM are normal Neck: Normal range of motion Cardiovascular: Normal rate Pulmonary/chest: Effort normal Neurologic: Patient is alert Skin: Skin is warm Psychiatric: Patient has normal mood and affect  Ortho Exam:  Left shoulder Exam Able to fully forward flex and abduct shoulder overhead No loss of ER relative to the other shoulder.  Good endpoint with ER No TTP over the bicipital groove Mild TTP over the [facility] joint Good subscapularis, supraspinatus, and infraspinatus strength Negative Hawkins impingement 5/5 grip strength, forearm pronation/supination, and bicep strength  Negative Spurling sign bilaterally TTP throughout the axial C-spine and paraspinal musculature Pain with cervical range of motion, especially when rotating to patient's left side Negative Hoffmann sign 4/5 motor strength of finger abduction   Specialty Comments:  No  specialty comments available.  Imaging: No results found.   PMFS History: Patient Active Problem List   Diagnosis Date Noted   Neck and shoulder pain 07/04/2019   Close Exposure to Covid-19 Virus 06/25/2019   Left knee pain 06/10/2019   Viral upper respiratory infection 05/22/2019   Cervical radiculitis 03/10/2019   Prediabetes 02/06/2019   Carpal tunnel syndrome 02/03/2019   Vaginal  discharge 07/05/2016   Anxiety and depression 06/10/2016   Other optic atrophy, 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   Essential hypertension, benign 05/10/2015   Dizziness 09/30/2014   Mechanical complication-ventricular(CSF) communicating shunt ([place]) 03/05/2013   Hidradenitis 10/15/2012   Right hand pain 02/13/2012   Tobacco abuse 01/03/2012   Legally blind 01/03/2012   Obesity 01/03/2012   Marijuana smoker 01/03/2012   Past Medical History:  Diagnosis Date   Chronic back pain    Chronic leg pain    Diabetes mellitus without complication (HCC)    Type II   Dizziness    Hydradenitis    Hyperlipidemia    Legally blind    Macular degeneration    Pseudotumor cerebri syndrome 2005   shunt placed- and legally blind   Sciatica     Family History  Problem Relation Age of Onset   Diabetes Mother    Hypertension Mother    Diabetes Father     Past Surgical History:  Procedure Laterality Date   CSF SHUNT     2 revisions   MULTIPLE EXTRACTIONS WITH ALVEOLOPLASTY Bilateral 11/02/2017   Procedure: MULTIPLE EXTRACTION;  Surgeon: Diona Browner, DDS;  Location: [facility];  Service: Oral Surgery;  Laterality: Bilateral;   Social History   Occupational History   Not on file  Tobacco Use   Smoking status: Current Every Day Smoker    Packs/day: 0.50    Types: Cigarettes   Smokeless tobacco: Never Used  Substance and Sexual Activity   Alcohol use: No   Drug use: No    Comment: marijuana -- bag per day.    Sexual activity: Yes    Birth control/protection: None    Comment: 1 partners

## 2019-07-21 ENCOUNTER — Encounter: Payer: Self-pay | Admitting: Orthopedic Surgery

## 2019-08-06 ENCOUNTER — Ambulatory Visit: Payer: Medicaid Other | Admitting: Orthopedic Surgery

## 2019-08-06 ENCOUNTER — Other Ambulatory Visit: Payer: Self-pay | Admitting: Family Medicine

## 2019-08-06 DIAGNOSIS — M25512 Pain in left shoulder: Secondary | ICD-10-CM

## 2019-08-06 DIAGNOSIS — G8929 Other chronic pain: Secondary | ICD-10-CM

## 2019-09-29 ENCOUNTER — Telehealth (INDEPENDENT_AMBULATORY_CARE_PROVIDER_SITE_OTHER): Payer: Medicaid Other | Admitting: Family Medicine

## 2019-09-29 ENCOUNTER — Other Ambulatory Visit: Payer: Self-pay

## 2019-09-29 ENCOUNTER — Telehealth: Payer: Self-pay | Admitting: Family Medicine

## 2019-09-29 DIAGNOSIS — Z20822 Contact with and (suspected) exposure to covid-19: Secondary | ICD-10-CM

## 2019-09-29 DIAGNOSIS — Z20828 Contact with and (suspected) exposure to other viral communicable diseases: Secondary | ICD-10-CM

## 2019-09-29 NOTE — Telephone Encounter (Signed)
Pt had a virtual appointment today with Dr. Grandville Silos and he asked her to call back with the best fax number to send the note for her employer.  The best fax number is [phone].

## 2019-09-29 NOTE — Telephone Encounter (Signed)
Wrote patient a work note. Will have staff fax it back as patient requested.

## 2019-09-29 NOTE — Telephone Encounter (Signed)
Letter printed and placed in to be fax pile.  Jazmin Hartsell,CMA

## 2019-09-29 NOTE — Progress Notes (Signed)
[facility] Visit  Patient consented to have virtual visit. Method of visit: Telephone  Encounter participants: Patient: Monica Huang - located at home Provider: Bonnita Hollow - located at office Others (if applicable): None  Chief Complaint: Upper respiratory infection  HPI:  Patient states that she has had upper respiratory tract infection since last Thursday.  Says that she has had cough, congestion, some wheezing, shortness of breath, and sore throat.  Patient was sent home from work.  Michela Pitcher that she has had sick contacts with family members that have had similar cold type symptoms.  Patient says that she is getting better with improved wheeze, no longer having shortness of breath, still has cold and congestion.  She is not taking anything for her symptoms.  Patient says that she has had no known contact with anyone with COVID-19.  She was tested 1 month ago and was negative.  Patient says that she would need a note to send to her work.  Discussed with patient that she likely should remain out of work until we have COVID-19 testing that is negative or she has complete resolution of symptoms. Patient that she would need a note faxed to her work.  Patient does not have access to MyChart and is legally blind saying that it be difficult for her to navigate to it. I request that patient call the office back with her fax number and then I will provide a note to send her work.  ROS: per HPI  Pertinent PMHx: Obesity, legally blind  Exam:  Respiratory: Patient speaking full sentences without issue, although she does sound raspy  Assessment/Plan: Viral URI, possible COVID-19 Patient has viral URI symptoms.  Sounds to be improving per history.  Discussed return precautions and going to the ED if she has worsening symptoms.  Patient will need a negative COVID-19 test before I feel that she is safe to go back to work.  Discussed self quarantining and testing.   Will provide note when patient calls back to the office with her office fax number.  Time spent during visit with patient: 11 minutes

## 2019-09-30 LAB — NOVEL CORONAVIRUS, NAA: SARS-CoV-2, NAA: NOT DETECTED

## 2019-10-01 ENCOUNTER — Telehealth: Payer: Self-pay | Admitting: Family Medicine

## 2019-10-01 NOTE — Telephone Encounter (Signed)
Patient called in and received her covid test result  °

## 2019-10-02 ENCOUNTER — Telehealth: Payer: Self-pay | Admitting: Family Medicine

## 2019-10-02 NOTE — Telephone Encounter (Signed)
Will forward to Dr. Janus Molder to write this letter for patient. Alara Daniel,CMA

## 2019-10-02 NOTE — Telephone Encounter (Signed)
Patient needs to get her Covid test results back as she needs them for her work.  Please call her as soon as possible at [phone].

## 2019-10-03 NOTE — Telephone Encounter (Signed)
Results printed for patient to pick up.

## 2019-10-21 ENCOUNTER — Other Ambulatory Visit: Payer: Self-pay

## 2019-10-21 ENCOUNTER — Ambulatory Visit (INDEPENDENT_AMBULATORY_CARE_PROVIDER_SITE_OTHER): Payer: Medicaid Other | Admitting: Family Medicine

## 2019-10-21 ENCOUNTER — Encounter: Payer: Self-pay | Admitting: Family Medicine

## 2019-10-21 VITALS — BP 112/76 | HR 96 | Wt 308.4 lb

## 2019-10-21 DIAGNOSIS — Z6841 Body Mass Index (BMI) 40.0 and over, adult: Secondary | ICD-10-CM | POA: Diagnosis not present

## 2019-10-21 DIAGNOSIS — M25561 Pain in right knee: Secondary | ICD-10-CM

## 2019-10-21 DIAGNOSIS — M25562 Pain in left knee: Secondary | ICD-10-CM

## 2019-10-21 DIAGNOSIS — G8929 Other chronic pain: Secondary | ICD-10-CM | POA: Diagnosis not present

## 2019-10-21 NOTE — Patient Instructions (Signed)
It was a pleasure to see you today! Thank you for choosing [facility] for your primary care. Monica Huang was seen for bilateral knee pain. Come back to the clinic to see Dr. Janus Molder in a month to tell her about your progress.  Today we talked about your bilateral knee pain, I think it is possible that this is something called runner's knee, it seems that you have crepitus or "creaking "in your knees when you extend them and based off your story I do not think this is an acute injury but rather a chronic problem.  I think the first step for you would be go to PT to work on strengthening your legs and joint muscles for stabilization.  I also think you should speak to the nutritionist about attempting to lose weight, if we cannot work on weight control with nutrition it might be time to consider speaking to a bariatric surgeon.  Please come back and see Dr. Anette Riedel in about a month.   Please bring all your medications to every doctors visit   Sign up for My Chart to have easy access to your labs results, and communication with your Primary care physician.     Please check-out at the front desk before leaving the clinic.     Best,  Dr. Sherene Sires FAMILY MEDICINE RESIDENT - PGY3 10/21/2019 9:14 AM

## 2019-10-23 DIAGNOSIS — G8929 Other chronic pain: Secondary | ICD-10-CM | POA: Insufficient documentation

## 2019-10-23 DIAGNOSIS — M25562 Pain in left knee: Secondary | ICD-10-CM | POA: Insufficient documentation

## 2019-10-23 NOTE — Assessment & Plan Note (Signed)
Patient with chronic pain of both knees.  No new significant traumatic injury.  Believe this is chronic degradation product by deconditioning and obesity.  We are referring to nutrition as well as physical therapy for strengthening exercises and hope that this combination will allow her to avoid bariatric surgery consultation.

## 2019-10-23 NOTE — Assessment & Plan Note (Signed)
We discussed this is a significant impact on her health including her chronic knee pain, she agrees to consultation with nutrition and will do a food diary for 3 days.  Goal is to avoid bariatric surgery consultation

## 2019-10-23 NOTE — Progress Notes (Signed)
    Subjective:  Monica Huang is a 38 y.o. female who presents to the [facility] today with a chief complaint of chronic knee pain.   HPI: Chronic pain of both knees Patient with chronic pain of both knees.  No new significant traumatic injury.  She believe this is chronic degradation product by deconditioning and obesity.  Objective:  Physical Exam: BP 112/76   Pulse 96   Wt (!) 308 lb 6.4 oz (139.9 kg)   SpO2 99%   BMI 46.89 kg/m   Gen: NAD, conversing comfortably, very pleasant  CV: Regular heart rate Pulm: No increased work of breathing, no cough MSK: Knees with no obvious sign of infection or erythema, no significant bony abnormalities although there is crepitus, no sign of acute traumatic injury. Skin: warm, dry Neuro: grossly normal, moves all extremities Psych: Normal affect and thought content  No results found for this or any previous visit (from the past 72 hour(s)).   Assessment/Plan:  Chronic pain of both knees Patient with chronic pain of both knees.  No new significant traumatic injury.  Believe this is chronic degradation product by deconditioning and obesity.  We are referring to nutrition as well as physical therapy for strengthening exercises and hope that this combination will allow her to avoid bariatric surgery consultation.  Obesity We discussed this is a significant impact on her health including her chronic knee pain, she agrees to consultation with nutrition and will do a food diary for 3 days.  Goal is to avoid bariatric surgery consultation   Sherene Sires, [place] - PGY3 10/23/2019 2:55 PM

## 2019-11-10 ENCOUNTER — Ambulatory Visit: Payer: Medicaid Other | Attending: Family Medicine | Admitting: Physical Therapy

## 2019-11-14 ENCOUNTER — Ambulatory Visit: Payer: Medicaid Other | Admitting: Student in an Organized Health Care Education/Training Program

## 2019-12-05 ENCOUNTER — Ambulatory Visit: Payer: Medicaid Other | Admitting: Family Medicine

## 2019-12-08 ENCOUNTER — Ambulatory Visit (INDEPENDENT_AMBULATORY_CARE_PROVIDER_SITE_OTHER): Payer: Medicaid Other | Admitting: Family Medicine

## 2019-12-08 ENCOUNTER — Encounter: Payer: Self-pay | Admitting: Family Medicine

## 2019-12-08 ENCOUNTER — Other Ambulatory Visit: Payer: Self-pay

## 2019-12-08 VITALS — BP 132/80 | Ht 68.0 in | Wt 301.1 lb

## 2019-12-08 DIAGNOSIS — R7303 Prediabetes: Secondary | ICD-10-CM

## 2019-12-08 LAB — POCT GLYCOSYLATED HEMOGLOBIN (HGB A1C): HbA1c, POC (controlled diabetic range): 6.1 % (ref 0.0–7.0)

## 2019-12-08 NOTE — Patient Instructions (Signed)
It was great seeing you again!  Your A1c is 6.1 which is slightly up from 5.8 few months ago.  It was 5.4 a few months for that.  While this is increased it is understandable given that it is hard to get exercise and being cooped up in the house due to COVID-19 creates an environment we are going to eat a lot of snacks.  The good news is that we do not need to start any medication today, but the bad news is that you do have prediabetes again.  I would like to see you back in about 3 months for recheck.  We talked about a number of dietary changes you can make to help decrease this.

## 2019-12-11 ENCOUNTER — Encounter: Payer: Self-pay | Admitting: Family Medicine

## 2019-12-11 NOTE — Progress Notes (Signed)
   HPI 39 year old female who presents for A1c check.  Patient feels that she has not been eating very well secondary to COVID-19 pandemic.  Of note the patient is legally blind has trouble getting exercise.  Feels that her diet has suffered and she had been drinking a lot of sweet beverages such as sweet tea.  She has been using a good amount of artificial sweetener in lieu of blood sugar, but states that she has also been eating some carb heavy foods well.  A1c 6.1 from 5.8, which was also up from 5.4 time previous.  Currently diet controlled does not take any medications.  Patient has actually lost 7 pounds since last visit back in November 2020.  CC: A1c check   ROS:   Review of Systems See HPI for ROS.   CC, SH/smoking status, and VS noted  Objective: BP 132/80   Ht 5\' 8"  (1.727 m)   Wt (!) 301 lb 2 oz (136.6 kg)   LMP 12/05/2019   BMI 45.79 kg/m  Gen: 39 year old African-American female, no acute distress, resting comfortably CV: Regular rate rhythm, no M/R/G Resp: Lungs clear to auscultation bilaterally, no accessory muscle use Abd: Soft, nontender, nondistended. Neuro: Alert and oriented, Speech clear, No gross deficits   Assessment and plan:  Prediabetes A1c up from 5.8->6.1.  Patient attributes this minor increase to her diet during the COVID-19 pandemic.  Has actually lost 7 pounds since last visit.  Reinforced importance of dietary control and getting exercise when able.  Understandably her exercise ability is limited due to her blindness.  Follow-up in 3 months, if continues to increase could consider restarting Metformin.  Needs Optho and foot exam.   Orders Placed This Encounter  Procedures  . HgB A1c    No orders of the defined types were placed in this encounter.    30 MD PGY-3 Family Medicine Resident  12/11/2019 11:52 AM

## 2019-12-11 NOTE — Assessment & Plan Note (Addendum)
A1c up from 5.8->6.1.  Patient attributes this minor increase to her diet during the COVID-19 pandemic.  Has actually lost 7 pounds since last visit.  Reinforced importance of dietary control and getting exercise when able.  Understandably her exercise ability is limited due to her blindness.  Follow-up in 3 months, if continues to increase could consider restarting Metformin.  Needs Optho and foot exam.

## 2019-12-22 ENCOUNTER — Emergency Department (HOSPITAL_COMMUNITY)
Admission: EM | Admit: 2019-12-22 | Discharge: 2019-12-22 | Discharge status: Against Medical Advice | Payer: Medicaid Other | Attending: Emergency Medicine | Admitting: Emergency Medicine

## 2019-12-22 ENCOUNTER — Encounter (HOSPITAL_COMMUNITY): Payer: Self-pay | Admitting: Emergency Medicine

## 2019-12-22 ENCOUNTER — Emergency Department (HOSPITAL_COMMUNITY): Payer: Medicaid Other

## 2019-12-22 ENCOUNTER — Other Ambulatory Visit: Payer: Self-pay

## 2019-12-22 DIAGNOSIS — R42 Dizziness and giddiness: Secondary | ICD-10-CM | POA: Insufficient documentation

## 2019-12-22 DIAGNOSIS — R002 Palpitations: Secondary | ICD-10-CM

## 2019-12-22 DIAGNOSIS — I10 Essential (primary) hypertension: Secondary | ICD-10-CM | POA: Insufficient documentation

## 2019-12-22 DIAGNOSIS — F17210 Nicotine dependence, cigarettes, uncomplicated: Secondary | ICD-10-CM | POA: Insufficient documentation

## 2019-12-22 LAB — CBC WITH DIFFERENTIAL/PLATELET
Abs Immature Granulocytes: 0.02 10*3/uL (ref 0.00–0.07)
Basophils Absolute: 0.1 10*3/uL (ref 0.0–0.1)
Basophils Relative: 1 %
Eosinophils Absolute: 0.1 10*3/uL (ref 0.0–0.5)
Eosinophils Relative: 1 %
HCT: 44.2 % (ref 36.0–46.0)
Hemoglobin: 14.2 g/dL (ref 12.0–15.0)
Immature Granulocytes: 0 %
Lymphocytes Relative: 50 %
Lymphs Abs: 3.8 10*3/uL (ref 0.7–4.0)
MCH: 31.8 pg (ref 26.0–34.0)
MCHC: 32.1 g/dL (ref 30.0–36.0)
MCV: 99.1 fL (ref 80.0–100.0)
Monocytes Absolute: 0.4 10*3/uL (ref 0.1–1.0)
Monocytes Relative: 5 %
Neutro Abs: 3.3 10*3/uL (ref 1.7–7.7)
Neutrophils Relative %: 43 %
Platelets: 301 10*3/uL (ref 150–400)
RBC: 4.46 MIL/uL (ref 3.87–5.11)
RDW: 13.4 % (ref 11.5–15.5)
WBC: 7.8 10*3/uL (ref 4.0–10.5)
nRBC: 0 % (ref 0.0–0.2)

## 2019-12-22 LAB — COMPREHENSIVE METABOLIC PANEL
ALT: 15 U/L (ref 0–44)
AST: 14 U/L — ABNORMAL LOW (ref 15–41)
Albumin: 3.6 g/dL (ref 3.5–5.0)
Alkaline Phosphatase: 51 U/L (ref 38–126)
Anion gap: 11 (ref 5–15)
BUN: 10 mg/dL (ref 6–20)
CO2: 24 mmol/L (ref 22–32)
Calcium: 8.8 mg/dL — ABNORMAL LOW (ref 8.9–10.3)
Chloride: 104 mmol/L (ref 98–111)
Creatinine, Ser: 0.89 mg/dL (ref 0.44–1.00)
GFR calc Af Amer: >60 mL/min (ref >60)
GFR calc non Af Amer: >60 mL/min (ref >60)
Glucose, Bld: 81 mg/dL (ref 70–99)
Potassium: 4 mmol/L (ref 3.5–5.1)
Sodium: 139 mmol/L (ref 135–145)
Total Bilirubin: 0.6 mg/dL (ref 0.3–1.2)
Total Protein: 7.6 g/dL (ref 6.5–8.1)

## 2019-12-22 LAB — TROPONIN I (HIGH SENSITIVITY): Troponin I (High Sensitivity): <2 ng/L (ref <18)

## 2019-12-22 IMAGING — DX DG CHEST 1V PORT
1 series · 1 of 1 positions shown · non-contrast
Comparison: [DATE].

CLINICAL DATA: Numbness, lightheadedness, fatigue and headache.

EXAM:
PORTABLE CHEST 1 VIEW

[chest ap]
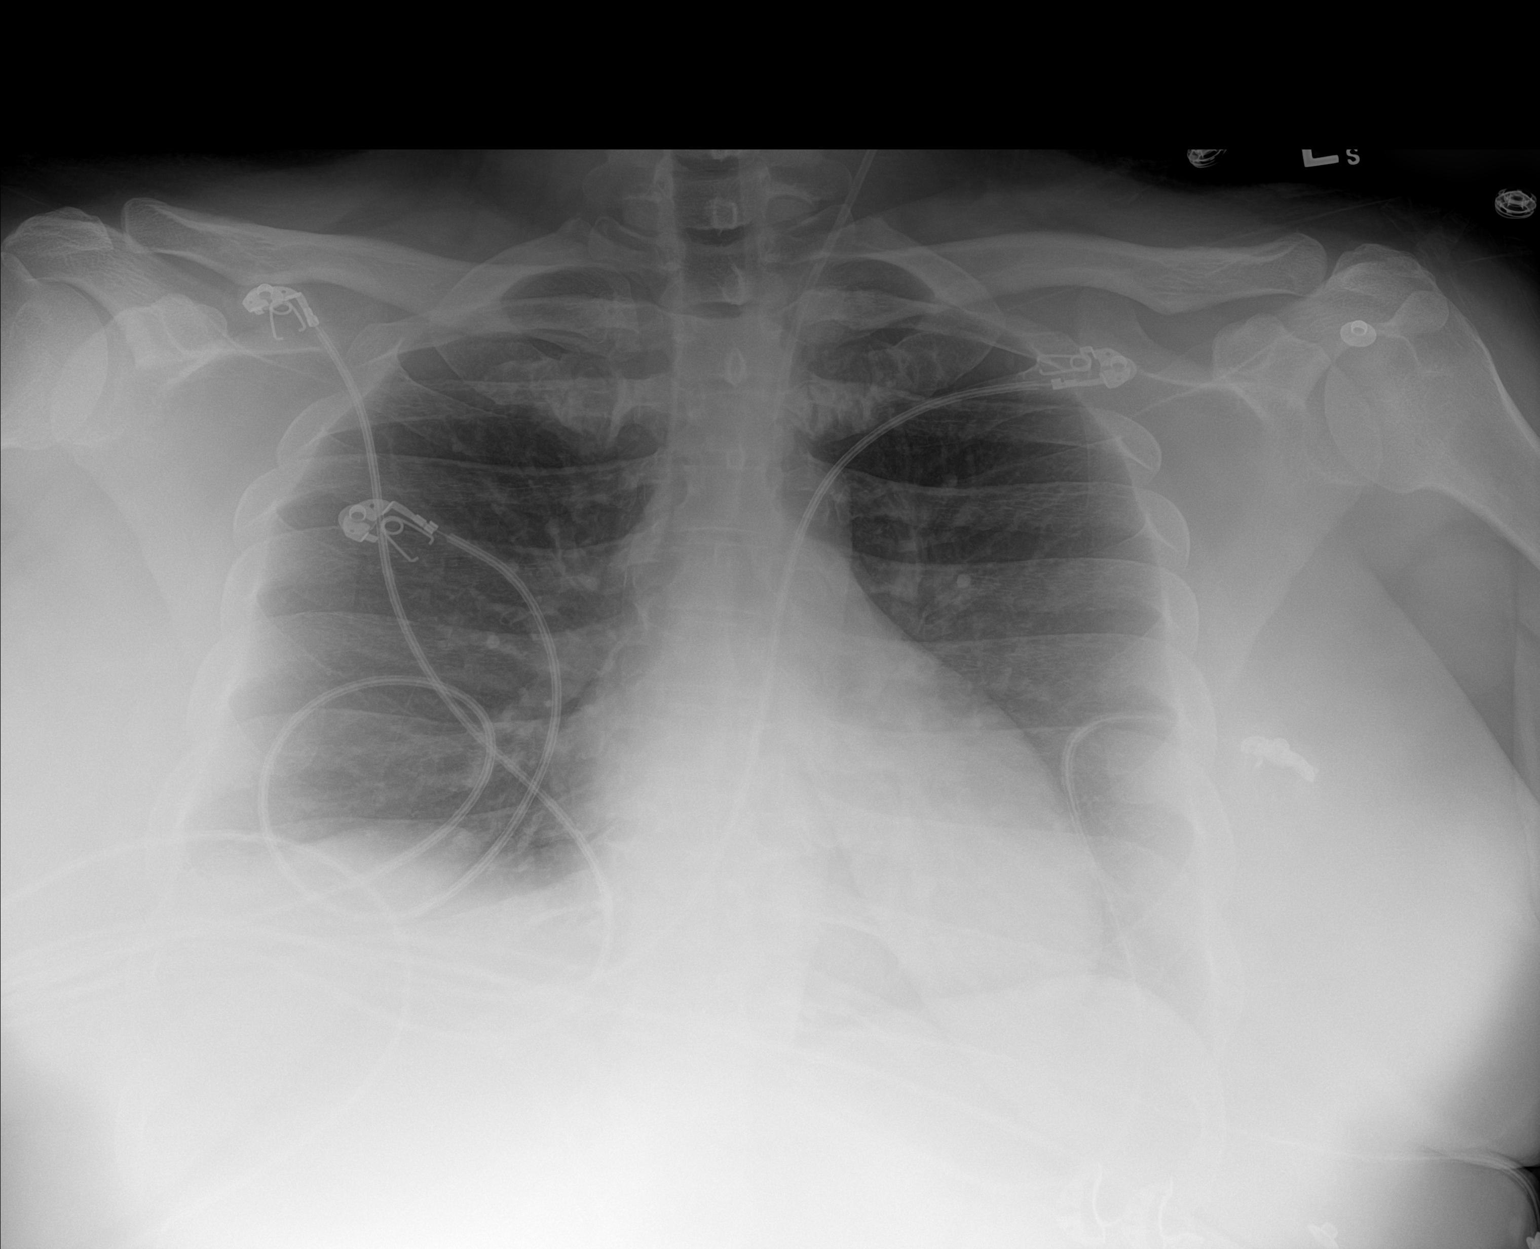

[1 of 1 positions shown; findings below may reference images not displayed]

FINDINGS: Trachea is midline. Heart size stable. Lungs are clear. No pleural
fluid.
IMPRESSION: No acute findings.

## 2019-12-22 NOTE — ED Provider Notes (Signed)
[facility] EMERGENCY DEPARTMENT Provider Note   CSN: [id number] Arrival date & time: 12/22/19  1229     History Chief Complaint  Patient presents with  . Palpitations  . Dizziness    Monica Huang is a 39 y.o. female.  The history is provided by the patient. No language interpreter was used.  Palpitations Palpitations quality:  Slow Onset quality:  Sudden Duration:  8 hours Timing:  Constant Progression:  Worsening Chronicity:  New Relieved by:  Nothing Worsened by:  Nothing Ineffective treatments:  None tried Associated symptoms: dizziness   Risk factors: diabetes mellitus   Dizziness Quality:  Lightheadedness Severity:  Moderate Onset quality:  Gradual Timing:  Constant Progression:  Worsening Chronicity:  New Relieved by:  None tried Associated symptoms: palpitations   Pt reports her heart felt like it slowed down. Pt reports she has had similar in the past  Pt reports she is legally blind.  Pt reports nurse told her her smile is crooked but it is always that way.  Pt reports chronic problems with numbness in right leg.  Pt reports she is prediabetic but she is not on any medication.  Pt given update on chest xray.  Pt states she does not want to wait for labs.  She reports she wants to go home.      Past Medical History:  Diagnosis Date  . Chronic back pain   . Chronic leg pain   . Diabetes mellitus without complication (HCC)    Type II  . Dizziness   . Hydradenitis   . Hyperlipidemia   . Legally blind   . Macular degeneration   . Pseudotumor cerebri syndrome 2005   shunt placed- and legally blind  . Sciatica     Patient Active Problem List   Diagnosis Date Noted  . Chronic pain of both knees 10/23/2019  . Neck and shoulder pain 07/04/2019  . Close exposure to COVID-19 virus 06/25/2019  . Left knee pain 06/10/2019  . Viral upper respiratory infection 05/22/2019  . Cervical radiculitis 03/10/2019  . Prediabetes 02/06/2019  .  Carpal tunnel syndrome 02/03/2019  . Vaginal discharge 07/05/2016  . Anxiety and depression 06/10/2016  . Other optic atrophy, 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  . Essential hypertension, benign 05/10/2015  . Dizziness 09/30/2014  . Mechanical complication-ventricular(CSF) communicating shunt (HCC) 03/05/2013  . Hidradenitis 10/15/2012  . Right hand pain 02/13/2012  . Tobacco abuse 01/03/2012  . Legally blind 01/03/2012  . Obesity 01/03/2012  . Marijuana smoker 01/03/2012    Past Surgical History:  Procedure Laterality Date  . CSF SHUNT     2 revisions  . MULTIPLE EXTRACTIONS WITH ALVEOLOPLASTY Bilateral 11/02/2017   Procedure: MULTIPLE EXTRACTION;  Surgeon: Ocie Doyne, DDS;  Location: [facility] OR;  Service: Oral Surgery;  Laterality: Bilateral;     OB History   No obstetric history on file.     Family History  Problem Relation Age of Onset  . Diabetes Mother   . Hypertension Mother   . Diabetes Father     Social History   Tobacco Use  . Smoking status: Current Every Day Smoker    Packs/day: 0.50    Types: Cigarettes  . Smokeless tobacco: Never Used  Substance Use Topics  . Alcohol use: No  . Drug use: No    Comment: marijuana -- bag per day.     Home Medications Prior to Admission medications   Medication Sig Start Date End Date Taking? Authorizing Provider  cyclobenzaprine (FLEXERIL) 5 MG  tablet TAKE 1 TABLET (5 MG TOTAL) BY MOUTH AT BEDTIME AS NEEDED FOR MUSCLE SPASMS. 08/07/19   Autry-Lott, Randa Evens, DO  ibuprofen (ADVIL,MOTRIN) 800 MG tablet Take 1 tablet (800 mg total) by mouth every 8 (eight) hours as needed for mild pain or moderate pain. 04/05/18   Howard Pouch, MD  meloxicam (MOBIC) 15 MG tablet Take 1 tablet (15 mg total) by mouth daily. 06/09/19   Myrene Buddy, MD    Allergies    Patient has no known allergies.  Review of Systems   Review of Systems  Cardiovascular: Positive for palpitations.  Neurological: Positive for dizziness.  All other systems reviewed  and are negative.   Physical Exam Updated Vital Signs BP 112/76   Pulse 70   Resp 18   Wt (!) 136.6 kg   LMP 12/05/2019   SpO2 100%   BMI 45.79 kg/m   Physical Exam Vitals and nursing note reviewed.  Constitutional:      Appearance: She is well-developed.  HENT:     Head: Normocephalic.     Mouth/Throat:     Mouth: Mucous membranes are moist.  Cardiovascular:     Rate and Rhythm: Normal rate and regular rhythm.  Pulmonary:     Effort: Pulmonary effort is normal.  Abdominal:     General: Abdomen is flat. There is no distension.  Musculoskeletal:        General: Normal range of motion.     Cervical back: Normal range of motion.  Neurological:     General: No focal deficit present.     Mental Status: She is alert and oriented to person, place, and time.  Psychiatric:        Mood and Affect: Mood normal.     ED Results / Procedures / Treatments   Labs (all labs ordered are listed, but only abnormal results are displayed) Labs Reviewed  COMPREHENSIVE METABOLIC PANEL - Abnormal; Notable for the following components:      Result Value   Calcium 8.8 (*)    AST 14 (*)    All other components within normal limits  CBC WITH DIFFERENTIAL/PLATELET  TROPONIN I (HIGH SENSITIVITY)  TROPONIN I (HIGH SENSITIVITY)    EKG EKG Interpretation  Date/Time:  Monday December 22 2019 12:34:09 EST Ventricular Rate:  78 PR Interval:    QRS Duration: 89 QT Interval:  363 QTC Calculation: 414 R Axis:   -11 Text Interpretation: Sinus rhythm Prolonged PR interval Consider right atrial enlargement Since last tracing Right atrial hypertrophy and prolonged PR Otherwise no significant change Confirmed by Mancel Bale [phone]) on 12/22/2019 4:06:24 PM   Radiology DG Chest Port 1 View  Result Date: 12/22/2019 CLINICAL DATA:  Numbness, lightheadedness, fatigue and headache. EXAM: PORTABLE CHEST 1 VIEW COMPARISON:  10/11/2014. FINDINGS: Trachea is midline. Heart size stable. Lungs are  clear. No pleural fluid. IMPRESSION: No acute findings. Electronically Signed   By: Leanna Battles M.D.   On: 12/22/2019 14:45    Procedures Procedures (including critical care time)  Medications Ordered in ED Medications - No data to display  ED Course  I have reviewed the triage vital signs and the nursing notes.  Pertinent labs & imaging results that were available during my care of the patient were reviewed by me and considered in my medical decision making (see chart for details).    MDM Rules/Calculators/A&P                      MDM  cardiac monitor, occasional pvc, EKG reviewed,  Chest xray is normal. Pt does not have any extremity weakness, smile appears normal  Pt reports she feels normal now and does not want to stay for blood work.  Pt informs me she does not want to stay for further evaluation or lab work.  Pt AMA.  Pt's 1st troponin returned and is negative   Final Clinical Impression(s) / ED Diagnoses Final diagnoses:  Palpitations    Rx / DC Orders ED Discharge Orders    None    An After Visit Summary was printed and given to the patient.    Fransico Meadow, [place] 12/22/19 1629    Daleen Bo, MD 12/22/19 [id number]

## 2019-12-22 NOTE — ED Notes (Signed)
Pt dressed in clothes, states she wants to leave, despite not having 2nd Trop results. States she will f/u with PCP tomorrow

## 2019-12-22 NOTE — ED Triage Notes (Signed)
Pt reports waking with HA at 0515. States she was sitting in chair, went to got to bathroom around 1130 and felt R leg weakness and numbness to thigh. States the numbness comes and goes. Denies any further vision changes from baseline R eye partial blindness. R facial droop present, grip and leg strength equal

## 2019-12-22 NOTE — ED Triage Notes (Addendum)
Pt BIB [facility] w/ c/o palpitations, diaphoresis, and dizziness starting this morning. Describes HR as "slowing down" Pt had nausea and headache this am. Pt has hx of a similar episode, diagnosed with a "blockage". Pt has vision loss at baseline. VS stable, NAD noted. Pt reporting tingling in R leg.

## 2019-12-23 ENCOUNTER — Ambulatory Visit (INDEPENDENT_AMBULATORY_CARE_PROVIDER_SITE_OTHER): Payer: Medicaid Other | Admitting: Family Medicine

## 2019-12-23 ENCOUNTER — Encounter: Payer: Self-pay | Admitting: Family Medicine

## 2019-12-23 ENCOUNTER — Other Ambulatory Visit: Payer: Self-pay

## 2019-12-23 VITALS — BP 122/80 | HR 89 | Wt 308.2 lb

## 2019-12-23 DIAGNOSIS — R002 Palpitations: Secondary | ICD-10-CM | POA: Diagnosis present

## 2019-12-23 NOTE — Patient Instructions (Signed)
It was great seeing you again today!  I am sorry that you have had the continued problems with your heartbeat.  We reviewed your labs and EKG from the emergency department which fortunately looked good and reassuring.  Given that he had this issue before have seen cardiology in the past I think it is a good idea to get reestablished with them.  Placed a cardiology referral to get you back into see Dr. Tresa Endo today.

## 2019-12-23 NOTE — Progress Notes (Signed)
   HPI 39 year old female presents as ED follow-up.  She was seen on 12/21/2018 for heart palpitations.  She states that she can feel her blood pressure drop, had accompanying chest pain, and almost passed out which prompted her to go to the emergency department.  She started feeling much better and the work-up that was accomplished was nonrevealing.  Patient left prior to getting second troponin level schedule PCP follow-up.  EKG tracing was only significant for prolonged PR interval and right atrial hypertrophy.  Patient had previously had syncope back in 2017 for which she saw Dr. Tresa Endo from cardiology.  States that she "wore some kind of monitor" and the work-up was significant for a heart block. It looks like she had a holter monitor which revealed a couple of beats of 2nd degree weinkebach block.  Patient states that she is feeling better at this point, although she has maintained a headache since her ED visit.  Has not had any syncopal episodes today.  No chest pain today  CC: ED follow-up  ROS:   Review of Systems See HPI for ROS.   CC, SH/smoking status, and VS noted  Objective: BP 122/80   Pulse 89   Wt (!) 308 lb 3.2 oz (139.8 kg)   LMP 12/05/2019   SpO2 99%   BMI 46.86 kg/m  Gen: Very pleasant 39 year old African-American female, no acute distress CV: RRR, no murmur Resp: CTAB, no wheezes, non-labored Abd: SNTND, BS present, no guarding or organomegaly Ext: No edema, warm Neuro: Alert and oriented, Speech clear, No gross deficits  Assessment and plan:  Palpitations Patient with apparent palpitations.  Given the patient's history of second-degree week of back block on Holter monitor will place referral to cardiology care reestablished.  Given her near syncopal episode, and other concerning symptoms she may be a good candidate for another Holter monitor.  Right atrial hypertrophy and PR interval new on EKG less concerning.  Second troponin drawn at this visit per patient  request.   Orders Placed This Encounter  Procedures  . Troponin I  . Ambulatory referral to Cardiology    Referral Priority:   Routine    Referral Type:   Consultation    Referral Reason:   Specialty Services Required    Referred to Provider:   Lennette Bihari, MD    Requested Specialty:   Cardiology    Number of Visits Requested:   1    No orders of the defined types were placed in this encounter.    Myrene Buddy MD PGY-3 Family Medicine Resident  12/23/2019 8:21 PM

## 2019-12-23 NOTE — Assessment & Plan Note (Signed)
Patient with apparent palpitations.  Given the patient's history of second-degree week of back block on Holter monitor will place referral to cardiology care reestablished.  Given her near syncopal episode, and other concerning symptoms she may be a good candidate for another Holter monitor.  Right atrial hypertrophy and PR interval new on EKG less concerning.  Second troponin drawn at this visit per patient request.

## 2019-12-24 LAB — TROPONIN I: Troponin I: <0.01 ng/mL (ref 0.00–0.04)

## 2020-01-06 ENCOUNTER — Telehealth (INDEPENDENT_AMBULATORY_CARE_PROVIDER_SITE_OTHER): Payer: Medicaid Other | Admitting: Family Medicine

## 2020-01-06 ENCOUNTER — Other Ambulatory Visit: Payer: Self-pay

## 2020-01-06 ENCOUNTER — Encounter: Payer: Self-pay | Admitting: Family Medicine

## 2020-01-06 DIAGNOSIS — L732 Hidradenitis suppurativa: Secondary | ICD-10-CM | POA: Diagnosis not present

## 2020-01-06 DIAGNOSIS — Z720 Tobacco use: Secondary | ICD-10-CM | POA: Diagnosis not present

## 2020-01-06 NOTE — Progress Notes (Signed)
[facility] Telemedicine Visit  Patient consented to have virtual visit. Method of visit: Video was attempted, but technology challenges prevented patient from using video, so visit was conducted via telephone.  Encounter participants: Patient: Monica Huang - located at Home Provider: Melene Plan - located at [facility] Others (if applicable):   Chief Complaint: Boil   HPI: Seen by a dermatologist previously for HS, but is no longer practicing. Has received medication from [place] before. Denies fevers, chills. Reports about 3-4 nodules on the skin folds and one on vaginal mucosa just at opening of vagina. She reports draining of abscesses. Takes hot showers and uses warm, wet compress to help with draining  Dermatology at [facility].   ROS: per HPI  Pertinent PMHx: HS, obesity. Smoker  Exam:  Respiratory: NAD.   Assessment/Plan:  Smoker Patient reports that she has smoked for a long time.  Counseled patient on smoking cessation and offered resources if needed.  Patient is also aware that cigarette smoking has an effect on her hidradenitis.  Hidradenitis suppurativa Patient with multiple lesions on her groin at this time per patient report.  She denies any systemic signs of infection.  Will treat with clindamycin gel and Bactrim, as patient reports this is her typical regimen from her dermatologist.  We will also send referral to dermatology as her provider is no longer at [facility] clinic anymore.  She would like to continue with dermatology for her at bedtime treatment.  Will provide medications at this time until patient is able to reestablish. Patient also notes that she has a nodule on the inside of her vagina.  She reports that it is just at the opening of her vagina.  She reports that she has had them here before.  She does not know what a Bartholin cyst is and does not think that she has ever been diagnosed with 1.  As this would require a different  treatment, have encouraged patient to come into the office to be seen for further evaluation.  If Bartholin cyst, would refer to gynecology for Word catheter and possible biopsy to rule out any malignancy given her age and possible history of multiple Bartholin cysts.    Time spent during visit with patient: 16 minutes  Melene Plan, M.D.  10:08 AM 01/06/2020

## 2020-01-06 NOTE — Assessment & Plan Note (Signed)
Patient with multiple lesions on her groin at this time per patient report.  She denies any systemic signs of infection.  Will treat with clindamycin gel and Bactrim, as patient reports this is her typical regimen from her dermatologist.  We will also send referral to dermatology as her provider is no longer at [facility] clinic anymore.  She would like to continue with dermatology for her at bedtime treatment.  Will provide medications at this time until patient is able to reestablish. Patient also notes that she has a nodule on the inside of her vagina.  She reports that it is just at the opening of her vagina.  She reports that she has had them here before.  She does not know what a Bartholin cyst is and does not think that she has ever been diagnosed with 1.  As this would require a different treatment, have encouraged patient to come into the office to be seen for further evaluation.  If Bartholin cyst, would refer to gynecology for Word catheter and possible biopsy to rule out any malignancy given her age and possible history of multiple Bartholin cysts.

## 2020-01-06 NOTE — Assessment & Plan Note (Signed)
Patient reports that she has smoked for a long time.  Counseled patient on smoking cessation and offered resources if needed.  Patient is also aware that cigarette smoking has an effect on her hidradenitis.

## 2020-01-06 NOTE — Progress Notes (Signed)
[id number] - [place],  - [address]  847#  BP- N/A- GOOD   T- NO FEVER

## 2020-01-07 ENCOUNTER — Ambulatory Visit: Payer: Medicaid Other

## 2020-01-07 ENCOUNTER — Telehealth: Payer: Self-pay

## 2020-01-07 NOTE — Telephone Encounter (Signed)
Patient calls nurse line in regards to clindamycin gel and bactrim. Patient reports that medication was not sent into pharmacy. Office note states that treatment should include these medications, however, I am not finding the orders in the system.   To PCP and Dr Selena Batten  Please advise  Veronda Prude, RN

## 2020-01-08 MED ORDER — CLINDAMYCIN PHOSPHATE 1 % EX GEL: Freq: Two times a day (BID) | CUTANEOUS | 0 refills | Status: DC

## 2020-01-08 MED ORDER — SULFAMETHOXAZOLE-TRIMETHOPRIM 800-160 MG PO TABS: 1.0000 | ORAL_TABLET | Freq: Two times a day (BID) | ORAL | 0 refills | Status: AC

## 2020-01-08 NOTE — Telephone Encounter (Signed)
Sent in medications to pharmacy. Called patient and apologized for delay.

## 2020-01-08 NOTE — Telephone Encounter (Signed)
Pt is calling back to check if the doctor has called [facility] [address] [facility]. Please call in since she was suppose to already have this medication. jw

## 2020-01-09 ENCOUNTER — Telehealth: Payer: Self-pay | Admitting: *Deleted

## 2020-01-09 NOTE — Telephone Encounter (Signed)
Clindamycin gel not covered by Medicaid.  Please see below of formulary.  Let "RN Team" know if you are changing to covered medications or would like to pursue a PA. Jone Baseman, CMA

## 2020-01-10 MED ORDER — CLINDAMYCIN PHOSPHATE 1 % EX SOLN: Freq: Two times a day (BID) | CUTANEOUS | 0 refills | Status: DC

## 2020-01-11 NOTE — Progress Notes (Deleted)
Cardiology Office Note   Date:  01/11/2020   ID:  Katherina Mires, DOB 1981/07/15, MRN [id number]  PCP:  Lavonda Jumbo, DO  Cardiologist:  Nicki Guadalajara, MD EP: None  No chief complaint on file.     History of Present Illness: Monica Huang is a 39 y.o. 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 Tresa Endo 08/2017 at which time she had complaints of occasional dizziness with position changes, occasional palpitations for which she was ordered for an event monitor, and also noted to have a murmur on exam for which an echo was ordered. Echo in 2018 showed EF 60-65%, no RWMA, normal LV diastolic function, and mild MR/TR. Her 48 hour holter monitor showed predominantly sinus rhythm with 1st degree AV block, though an episode of 2nd degree AV block with 3 ventricular pauses between >2 seconds, as well as PVCs and PACs. She has not been seen by cardiology since that time. She was recently evaluated in the ED 12/22/19 for palpitations. EKG at that time showed sinus rhythm with 1st degree AV block; no ischemic changes. She was noted to have occasional PVCs on telemetry, though decided to leave AMA prior to 2nd troponin as her palpitations had improved. She was recommended to follow-up with cardiology.  She presents today for follow-up of her palpitations.   1. Palpitations:  2. HTN: BP *** today. Not on any medications.  - Avoid AV nodal blocking agents in future if BP elevated given baseline 1st degree AV block and history of 2nd degree AV block on holter monitor.   3. HLD: LDL 81 on last check in 2016. Not on any cholesterol medications - Continue dietary and lifestyle modifications to promote lower cholesterol levels  4. Tobacco abuse: - Continue to encourage smoking cessation    Past Medical History:  Diagnosis  Date  . Chronic back pain   . Chronic leg pain   . Diabetes mellitus without complication (HCC)    Type II  . Dizziness   . Hydradenitis   . Hyperlipidemia   . Legally blind   . Macular degeneration   . Pseudotumor cerebri syndrome 2005   shunt placed- and legally blind  . Sciatica     Past Surgical History:  Procedure Laterality Date  . CSF SHUNT     2 revisions  . MULTIPLE EXTRACTIONS WITH ALVEOLOPLASTY Bilateral 11/02/2017   Procedure: MULTIPLE EXTRACTION;  Surgeon: Ocie Doyne, DDS;  Location: [facility] OR;  Service: Oral Surgery;  Laterality: Bilateral;     Current Outpatient Medications  Medication Sig Dispense Refill  . clindamycin (CLEOCIN-T) 1 % external solution Apply topically 2 (two) times daily. 30 mL 0  . cyclobenzaprine (FLEXERIL) 5 MG tablet TAKE 1 TABLET (5 MG TOTAL) BY MOUTH AT BEDTIME AS NEEDED FOR MUSCLE SPASMS. 30 tablet 2  . sulfamethoxazole-trimethoprim (BACTRIM DS) 800-160 MG tablet Take 1 tablet by mouth 2 (two) times daily for 10 days. 20 tablet 0   No current facility-administered medications for this visit.    Allergies:   Patient has no known allergies.    Social History:  The patient  reports that she has been smoking cigarettes. She has been smoking about 0.50 packs per day. She has never used smokeless tobacco. She reports that she does not drink alcohol or use drugs.   Family History:  The patient's ***family history includes Diabetes in her father and mother; Hypertension in her mother.    ROS:  Please see the history of present illness.   Otherwise, review of systems are positive for {NONE DEFAULTED:18576::"none"}.   All other systems are reviewed and negative.    PHYSICAL EXAM: VS:  There were no vitals taken for this visit. , BMI There is no height or weight on file to calculate BMI. GEN: Well nourished, well developed, in no acute distress HEENT: normal Neck: no JVD, carotid bruits, or masses Cardiac: ***RRR; no murmurs, rubs, or  gallops,no edema  Respiratory:  clear to auscultation bilaterally, normal work of breathing GI: soft, nontender, nondistended, + BS MS: no deformity or atrophy Skin: warm and dry, no rash Neuro:  Strength and sensation are intact Psych: euthymic mood, full affect   EKG:  EKG {ACTION; IS/IS RSW:[id number]} ordered today. The ekg ordered today demonstrates ***   Recent Labs: 02/03/2019: TSH 0.769 12/22/2019: ALT 15; BUN 10; Creatinine, Ser 0.89; Hemoglobin 14.2; Platelets 301; Potassium 4.0; Sodium 139    Lipid Panel    Component Value Date/Time   CHOL 147 03/11/2015 0841   TRIG 106 03/11/2015 0841   HDL 45 (L) 03/11/2015 0841   CHOLHDL 3.3 03/11/2015 0841   VLDL 21 03/11/2015 0841   LDLCALC 81 03/11/2015 0841   LDLDIRECT 100 (H) 01/01/2012 1155      Wt Readings from Last 3 Encounters:  12/23/19 (!) 308 lb 3.2 oz (139.8 kg)  12/22/19 (!) 301 lb 2.4 oz (136.6 kg)  12/08/19 (!) 301 lb 2 oz (136.6 kg)      Other studies Reviewed: Additional studies/ records that were reviewed today include:   Echocardiogram 08/2017: - Left ventricle: The cavity size was normal. Systolic function was  normal. The estimated ejection fraction was in the range of 60%  to 65%. Wall motion was normal; there were no regional wall  motion abnormalities. Left ventricular diastolic function  parameters were normal.  - Aortic valve: Trileaflet; normal thickness leaflets. There was no  regurgitation.  - Aortic root: The aortic root was normal in size.  - Mitral valve: There was mild regurgitation.  - Right ventricle: The cavity size was normal. Wall thickness was  normal. Systolic function was normal.  - Tricuspid valve: There was mild regurgitation.  - Pulmonic valve: There was no regurgitation.  - Pulmonary arteries: Systolic pressure was within the normal  range.  - Inferior vena cava: The vessel was normal in size.  - Pericardium, extracardiac: There was no pericardial effusion.     Impressions:   - Mild mitral and tricuspid regurgitation, otherwise normal study.  Holter monitor 2018: The predominant rhythm is sinus rhythm with an average heart rate at 88 bpm.  There were episodes of sinus bradycardia to a minimum of 41 and sinus tachycardia to a maximum of 144 bpm.  There was an episode of type I Wenkebach second-degree AV block.  There were 3 episodes of second-degree block with a ventricular pause over 2.0 seconds lasting 2.08, 2.04, and 2.36 seconds.  There were isolated PVCs with 7 bigeminal beats and some PACs with 2 atrial couplets.  The patient is not on any negative chronotropic medications.   ASSESSMENT AND PLAN:  1.  ***   Current medicines are reviewed at length with the patient today.  The patient {ACTIONS; HAS/DOES NOT HAVE:19233} concerns regarding medicines.  The following changes have been made:  {PLAN; NO CHANGE:13088:s}  Labs/ tests ordered today include: ***  No orders of the defined types were placed in this encounter.    Disposition:   FU with *** in {gen number 4-49:675916} {Days to years:10300}  Signed, Abigail Butts, PA-C  01/11/2020 2:55 PM

## 2020-01-12 ENCOUNTER — Ambulatory Visit: Payer: Medicaid Other | Admitting: Family Medicine

## 2020-01-12 ENCOUNTER — Ambulatory Visit: Payer: Medicaid Other | Admitting: Medical

## 2020-01-13 NOTE — Telephone Encounter (Signed)
Med changed to preferred. Jone Baseman, CMA

## 2020-01-30 ENCOUNTER — Ambulatory Visit (INDEPENDENT_AMBULATORY_CARE_PROVIDER_SITE_OTHER): Payer: Medicare Other | Admitting: Family Medicine

## 2020-01-30 ENCOUNTER — Other Ambulatory Visit: Payer: Self-pay

## 2020-01-30 DIAGNOSIS — M542 Cervicalgia: Secondary | ICD-10-CM | POA: Diagnosis present

## 2020-01-30 DIAGNOSIS — M25519 Pain in unspecified shoulder: Secondary | ICD-10-CM | POA: Diagnosis not present

## 2020-01-30 NOTE — Patient Instructions (Addendum)
Thank you for coming in to see [place] today! Please see below to review our plan for today's visit:  1.  Take Tylenol 500 mg with ibuprofen 200 mg every 6 hours as needed for pain control. 2.  Apply Voltaren gel to shoulder 4 times daily as needed for pain control. 3.  I am providing you with shoulder exercises to be performed twice daily to improve strength and range of motion. 4.  I encourage you to go get the MRI of your neck and then follow-up with your orthopedist Dr. August Saucer!  Please call the clinic at [phone] if your symptoms worsen or you have any concerns. It was our pleasure to serve you!   Dr. Peggyann Shoals [facility]

## 2020-02-02 NOTE — Progress Notes (Signed)
    SUBJECTIVE:   CHIEF COMPLAINT / HPI:   Patient presents with acutely worsening L shoulder and arm pain. States she has had pain in this shoulder before. Denies any falls or injuries. Has a throbbing, stabbing pain in left shoulder that goes up to neck and down to upper arm. 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 Reports painful range of motion, 9/10 pain even at rest. Has tried Voltaren gel to the shoulder once daily for pain control. Denies any chest pain, shortness of breath, hand numbness/tingling, denies overt weakness.   She has previously been seen for this issue in August 2020, seen by Dr. Rebbeca Paul and then on July 18, 2019 by Orthopedist Dr. August Saucer, MRI was ordered but was not performed d/t patient's shunt.  PERTINENT  PMH / PSH:  Hx of L Neck and Shoulder pain Cervical radiculitis Carpel tunnel syndrome Anxiety/Depression HTN Pre-diabetes Legally blind d/t Optic Atrophy Ventricular (CSF) Communicating Shunt Obesity (BMI 46)  OBJECTIVE:   BP 124/70   Pulse 96   Wt (!) 304 lb 6 oz (138.1 kg)   SpO2 98%   BMI 46.28 kg/m    PHYSICAL EXAM:  General: nontoxic appearing, NAD, pleasant patient Resp: comfortable work of breathing, CTA bilaterally Cardiac: RRR S1S2 present, no murmurs appreciated LEFT Shoulder: - Inspection: no obvious deformity, atrophy, bruising, or swelling - Palpation: general tenderness to palpation of trapezius, bicipital groove, deltoid and brachium  - ROM: full in flexion, painful in abduction, full in internal/external rotation - NV: weakness appreciated with 2+ radial pulse symmetrical with R, brisk capillary refill, 4/5 strength appreciated in L C6 and C8.  - Special tests: Positive L Hawkins, negative Empty Can, negative Yergason, neg Neer's, neg cross arm - Strength: 4/5 w/ arm Abduction, 5/5 in external rotation, internal rotation and flexion  ASSESSMENT/PLAN:   Neck and shoulder  pain Patient with tenderness to palpation of left trapezius, shoulder joint (ant deltoid) and brachium, pain with abduction, L-sided neck pain with Spurling's but no radiation, +Hawkins. Negative Empty Can, no drop arm. No history of falls or injuries, no deformity appreciated.. 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 Has history of cervical radiculitis and C5-6 degenerative changes. MRI of Neck was ordered 06/2019 but not performed as patient has Ventricular (CSF) communicating shunt which needed to be investigated prior to imaging. Shoulder and neck pain could be 2/2 worsening cervical radiculopathy (although negative Spurlings), acute on chronic Cervical radiculitis, rotator cuff tendinopathy, Adhesive capsulitis (less likely as patient has good ROM although painful), shoulder arthritis, inflammation 2/2 overuse (patient's occupation involves repeated motions of arms and shoulders), general deconditioning. Patient is Right handed.  - Tylenol 500 mg with ibuprofen 200 mg every 6 hours as needed for pain control. - Apply Voltaren gel to shoulder 4 times daily PRN pain control. - Shoulder exercises to be performed BID to improve strength and range of motion. - f/u shunt investigation to obtain MRI of neck - f/u w/ Orthopedist (Dr. August Saucer)     Dollene Cleveland, DO [facility]

## 2020-02-02 NOTE — Assessment & Plan Note (Signed)
Patient with tenderness to palpation of left trapezius, shoulder joint (ant deltoid) and brachium, pain with abduction, L-sided neck pain with Spurling's but no radiation, +Hawkins. Negative Empty Can, no drop arm. No history of falls or injuries, no deformity appreciated.. 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 Has history of cervical radiculitis and C5-6 degenerative changes. MRI of Neck was ordered 06/2019 but not performed as patient has Ventricular (CSF) communicating shunt which needed to be investigated prior to imaging. Shoulder and neck pain could be 2/2 worsening cervical radiculopathy (although negative Spurlings), acute on chronic Cervical radiculitis, rotator cuff tendinopathy, Adhesive capsulitis (less likely as patient has good ROM although painful), shoulder arthritis, inflammation 2/2 overuse (patient's occupation involves repeated motions of arms and shoulders), general deconditioning. Patient is Right handed.  - Tylenol 500 mg with ibuprofen 200 mg every 6 hours as needed for pain control. - Apply Voltaren gel to shoulder 4 times daily PRN pain control. - Shoulder exercises to be performed BID to improve strength and range of motion. - f/u shunt investigation to obtain MRI of neck - f/u w/ Orthopedist (Dr. August Saucer)

## 2020-02-16 ENCOUNTER — Encounter: Payer: Self-pay | Admitting: Medical

## 2020-02-16 ENCOUNTER — Ambulatory Visit: Payer: Medicare Other | Attending: Internal Medicine

## 2020-02-16 DIAGNOSIS — Z23 Encounter for immunization: Secondary | ICD-10-CM

## 2020-02-16 NOTE — Progress Notes (Signed)
   Covid-19 Vaccination Clinic  Name:  Monica Huang    MRN: [id number] DOB: 1981-02-03  02/16/2020  [name] was observed post Covid-19 immunization for 15 minutes without incident. She was provided with Vaccine Information Sheet and instruction to access the V-Safe system.   [name] was instructed to call 911 with any severe reactions post vaccine: Marland Kitchen Difficulty breathing  . Swelling of face and throat  . A fast heartbeat  . A bad rash all over body  . Dizziness and weakness   Immunizations Administered    Name Date Dose VIS Date Route   Pfizer COVID-19 Vaccine 02/16/2020  9:02 AM 0.3 mL 11/07/2019 Intramuscular   Manufacturer: [employer], [employer]   Lot: [id number]   NDC: [address]-9

## 2020-02-18 ENCOUNTER — Telehealth (INDEPENDENT_AMBULATORY_CARE_PROVIDER_SITE_OTHER): Payer: Medicare Other | Admitting: Family Medicine

## 2020-02-18 ENCOUNTER — Other Ambulatory Visit: Payer: Self-pay

## 2020-02-18 DIAGNOSIS — M5412 Radiculopathy, cervical region: Secondary | ICD-10-CM

## 2020-02-18 NOTE — Assessment & Plan Note (Addendum)
Unclear if symptoms truly caused by Covid vaccine or experiencing worsened radicular symptoms from known degenerative C5-C6 changes. Symptoms not consistent with adhesive capsulitis, infection, injection site irritation.  Currently awaiting MRI to better assess cervical spine now that she has clearance to undergo from VP shunt standpoint. Advised patient given difficulty to fully examine over video, would likely benefit from inperson appointment given new neurologic symptoms. Follow up with PCP and Ortho to determine status of MRI.

## 2020-02-18 NOTE — Progress Notes (Signed)
[facility] Telemedicine Visit  Patient consented to have virtual visit. Method of visit: Video  Encounter participants: Patient: Monica Huang - located at work Provider: Ellwood Dense - located at [facility] Others (if applicable): n/a  Chief Complaint: Arm pain  HPI:  Is endorsing worsened left shoulder and arm pain since last night after receiving her Covid vaccine 3/22.  She has history of left shoulder pain with numbness and tingling due to cervical radiculopathy but reports this has worsened and is experiencing decreased grip strength which is new.  She reports having to hold her arm close to her body due to movement exacerbating the pain.  She is taking 800 mg of ibuprofen for pain relief.  Denies fevers, chills, other joint pains.  She is unsure if she has any overlying skin changes as she is blind.  ROS: per HPI  Pertinent PMHx: HTN, CSF VP shunt, obesity, legally blind, anxiety/depression, tobacco use  Exam:  Respiratory: Speaks in full sentences, no respiratory distress Shoulder: Decreased AROM.  No overlying skin changes or swelling appreciable on video  Assessment/Plan:  Cervical radiculitis Unclear if symptoms truly caused by Covid vaccine or experiencing worsened radicular symptoms from known degenerative C5-C6 changes. Symptoms not consistent with adhesive capsulitis, infection, injection site irritation.  Currently awaiting MRI to better assess cervical spine now that she has clearance to undergo from VP shunt standpoint. Advised patient given difficulty to fully examine over video, would likely benefit from inperson appointment given new neurologic symptoms. Follow up with PCP and Ortho to determine status of MRI.    Time spent during visit with patient: 10 minutes

## 2020-02-24 ENCOUNTER — Other Ambulatory Visit: Payer: Medicare Other

## 2020-02-25 ENCOUNTER — Other Ambulatory Visit: Payer: Self-pay

## 2020-02-25 ENCOUNTER — Encounter: Payer: Self-pay | Admitting: Physician Assistant

## 2020-02-25 ENCOUNTER — Ambulatory Visit (INDEPENDENT_AMBULATORY_CARE_PROVIDER_SITE_OTHER): Payer: Medicare Other | Admitting: Physician Assistant

## 2020-02-25 ENCOUNTER — Encounter: Payer: Self-pay | Admitting: *Deleted

## 2020-02-25 ENCOUNTER — Telehealth: Payer: Self-pay | Admitting: *Deleted

## 2020-02-25 VITALS — BP 118/82 | HR 83 | Ht 69.0 in | Wt 313.8 lb

## 2020-02-25 DIAGNOSIS — G4730 Sleep apnea, unspecified: Secondary | ICD-10-CM

## 2020-02-25 DIAGNOSIS — R55 Syncope and collapse: Secondary | ICD-10-CM

## 2020-02-25 DIAGNOSIS — R002 Palpitations: Secondary | ICD-10-CM

## 2020-02-25 NOTE — Telephone Encounter (Signed)
Left message to return a call to discuss sleep study appointment details. 

## 2020-02-25 NOTE — Progress Notes (Signed)
Patient ID: Monica Huang, female   DOB: [birth year], 39 y.o.   MRN: [id number] Patient enrolled for [employer] to mail a 14 day ZIO AT long term monitor-Live Telemetry to her home.  Instructions sent to patient via My Chart message, and will be included in her monitor kit as well.

## 2020-02-25 NOTE — Progress Notes (Signed)
   Primary Cardiologist: Nicki Guadalajara, MD  [name] (DOB 01/06/1981) presented to cardiology office today for evaluation. Please excuse her from work today.   Thank you  [name], PA  [facility] [phone] 02/25/2020, 9:59 AM

## 2020-02-25 NOTE — Progress Notes (Signed)
Cardiology Office Note:    Date:  02/25/2020   ID:  Monica Huang, DOB 1981-02-11, MRN [id number]  PCP:  Lavonda Jumbo, DO  Cardiologist:  Nicki Guadalajara, MD  Electrophysiologist:  None   Referring MD: Lavonda Jumbo, DO   Chief Complaint  Patient presents with  . Follow-up    seen for Dr. Tresa Endo    History of Present Illness:    Monica Huang is a 39 y.o. female with a hx of morbid obesity, prediabetes, hidradenitis, chronic back pain, tobacco abuse, pseudotumor cerebri syndrome, blindness, and hyperlipidemia.  She is legally blind secondary to pseudotumor cerebri and underwent VP shunting.  Patient was previously seen in 2017 by Dr. Allyson Sabal for evaluation of presyncope.  Patient was last seen by Dr. Tresa Endo on 08/30/2017 for dizziness.  EKG obtained by PCP at the time suggested a irregular bradycardia with potential drop repeats.  Her previous dizziness seems to be orthostatic after she has been sitting for long period time.  She works as a Designer, industrial/product for the blind and unfortunately has to sit for long periods of time.  During the office visit, her orthostatic vital signs were negative.  Echocardiogram obtained on 09/17/2017 showed EF 60 to 65%, mild MR, mild TR.  A 48-hour monitor was obtained at that showed average heart rate of 88 bpm, predominantly sinus rhythm.  There were episodes of sinus bradycardia with minimum heart rate of 41, sinus tachycardia with maximum heart rate of 144.  There was a episode of Wenkebach second-degree type 1 AV block.  There were also 3 episodes of second-degree heart block with ventricular pauses greater than 2 seconds.   Patient recently presented to the ED on 12/22/2119 with palpitation.  Initial work-up was negative.  However patient left AMA before work-up could be completed.  She was seen by family medicine physician the following day.  Second troponin drawn the following day also was normal.  Patient presents today for cardiology office  visit.  She says she continued to feel presyncope about once every other week.  She does notice dizziness when she get up too quickly however usually does not result in presyncope.  She also has vague chest discomfort that happens during the presyncope and a noticeable drop in the heartbeat.  I suspect she is having second-degree heart block during the episodes.  I recommended a 2-week ZIO monitor with live monitoring for this patient.  At the same time, I also recommended a sleep study as her husband mentions that she sometimes she does have apnea episode and snoring.   Past Medical History:  Diagnosis Date  . Chronic back pain   . Chronic leg pain   . Diabetes mellitus without complication (HCC)    Type II  . Dizziness   . Hydradenitis   . Hyperlipidemia   . Legally blind   . Macular degeneration   . Pseudotumor cerebri syndrome 2005   shunt placed- and legally blind  . Sciatica     Past Surgical History:  Procedure Laterality Date  . CSF SHUNT     2 revisions  . MULTIPLE EXTRACTIONS WITH ALVEOLOPLASTY Bilateral 11/02/2017   Procedure: MULTIPLE EXTRACTION;  Surgeon: Ocie Doyne, DDS;  Location: [facility] OR;  Service: Oral Surgery;  Laterality: Bilateral;    Current Medications: Current Meds  Medication Sig  . cyclobenzaprine (FLEXERIL) 10 MG tablet   . ibuprofen (ADVIL) 800 MG tablet Take 800 mg by mouth every 8 (eight) hours as needed.     Allergies:  Patient has no known allergies.   Social History   Socioeconomic History  . Marital status: Single    Spouse name: Not on file  . Number of children: Not on file  . Years of education: Not on file  . Highest education level: Not on file  Occupational History  . Not on file  Tobacco Use  . Smoking status: Current Every Day Smoker    Packs/day: 0.50    Types: Cigarettes  . Smokeless tobacco: Never Used  Substance and Sexual Activity  . Alcohol use: No  . Drug use: No    Comment: marijuana -- bag per day.   Marland Kitchen Sexual  activity: Yes    Birth control/protection: None    Comment: 1 partners  Other Topics Concern  . Not on file  Social History Narrative   On disability since 2005 due to vision loss from psuedotumor cerebri.   Did not finish highschool-- completed the 9th grade.    Walking 2 x week for .          Social Determinants of Health   Financial Resource Strain:   . Difficulty of Paying Living Expenses:   Food Insecurity:   . Worried About Programme researcher, broadcasting/film/video in the Last Year:   . Barista in the Last Year:   Transportation Needs:   . Freight forwarder (Medical):   Marland Kitchen Lack of Transportation (Non-Medical):   Physical Activity:   . Days of Exercise per Week:   . Minutes of Exercise per Session:   Stress:   . Feeling of Stress :   Social Connections:   . Frequency of Communication with Friends and Family:   . Frequency of Social Gatherings with Friends and Family:   . Attends Religious Services:   . Active Member of Clubs or Organizations:   . Attends Banker Meetings:   Marland Kitchen Marital Status:      Family History: The patient's family history includes Diabetes in her father and mother; Hypertension in her mother.  ROS:   Please see the history of present illness.     All other systems reviewed and are negative.  EKGs/Labs/Other Studies Reviewed:    The following studies were reviewed today:  Echo 09/17/2017 Study Conclusions   - Left ventricle: The cavity size was normal. Systolic function was  normal. The estimated ejection fraction was in the range of 60%  to 65%. Wall motion was normal; there were no regional wall  motion abnormalities. Left ventricular diastolic function  parameters were normal.  - Aortic valve: Trileaflet; normal thickness leaflets. There was no  regurgitation.  - Aortic root: The aortic root was normal in size.  - Mitral valve: There was mild regurgitation.  - Right ventricle: The cavity size was normal. Wall  thickness was  normal. Systolic function was normal.  - Tricuspid valve: There was mild regurgitation.  - Pulmonic valve: There was no regurgitation.  - Pulmonary arteries: Systolic pressure was within the normal  range.  - Inferior vena cava: The vessel was normal in size.  - Pericardium, extracardiac: There was no pericardial effusion.   Impressions:   - Mild mitral and tricuspid regurgitation, otherwise normal study.    48 hour monitor 09/17/2017 The predominant rhythm is sinus rhythm with an average heart rate at 88 bpm.  There were episodes of sinus bradycardia to a minimum of 41 and sinus tachycardia to a maximum of 144 bpm.  There was an episode of type  I Wenkebach second-degree AV block.  There were 3 episodes of second-degree block with a ventricular pause over 2.0 seconds lasting 2.08, 2.04, and 2.36 seconds.  There were isolated PVCs with 7 bigeminal beats and some PACs with 2 atrial couplets.  The patient is not on any negative chronotropic medications    EKG:  EKG is ordered today.  The ekg ordered today demonstrates normal sinus rhythm with first-degree AV block.  Recent Labs: 12/22/2019: ALT 15; BUN 10; Creatinine, Ser 0.89; Hemoglobin 14.2; Platelets 301; Potassium 4.0; Sodium 139  Recent Lipid Panel    Component Value Date/Time   CHOL 147 03/11/2015 0841   TRIG 106 03/11/2015 0841   HDL 45 (L) 03/11/2015 0841   CHOLHDL 3.3 03/11/2015 0841   VLDL 21 03/11/2015 0841   LDLCALC 81 03/11/2015 0841   LDLDIRECT 100 (H) 01/01/2012 1155    Physical Exam:    VS:  BP 118/82   Pulse 83   Ht 5\' 9"  (1.753 m)   Wt (!) 313 lb 12.8 oz (142.3 kg)   SpO2 91%   BMI 46.34 kg/m     Wt Readings from Last 3 Encounters:  02/25/20 (!) 313 lb 12.8 oz (142.3 kg)  01/30/20 (!) 304 lb 6 oz (138.1 kg)  12/23/19 (!) 308 lb 3.2 oz (139.8 kg)     GEN:  Well nourished, well developed in no acute distress HEENT: Normal NECK: No JVD; No carotid bruits LYMPHATICS: No  lymphadenopathy CARDIAC: RRR, no murmurs, rubs, gallops RESPIRATORY:  Clear to auscultation without rales, wheezing or rhonchi  ABDOMEN: Soft, non-tender, non-distended MUSCULOSKELETAL:  No edema; No deformity  SKIN: Warm and dry NEUROLOGIC:  Alert and oriented x 3 PSYCHIATRIC:  Normal affect   ASSESSMENT:    1. Pre-syncope   2. Palpitations   3. Sleep apnea, unspecified type   4. Morbid obesity (HCC)    PLAN:    In order of problems listed above:  1. Presyncope: Also with palpitation feeling.  She described her palpitation as slowing down of the heartbeat.  Avoid AV nodal blocking in this patient who has a history of second-degree AV block on the previous Holter monitor in 2018.  She is having presyncope every other week, this usually occurs after she has been sitting down for a while then try to get up.  She also has a vague chest discomfort around the same time.  I suspect the chest discomfort may be related to the drop in the heartbeat.  Low suspicion for significant coronary artery disease.  I recommended a 2-week ZIO monitor with live recording.  2. Obstructive sleep apnea: I suspect she has obstructive sleep apnea as her husband noticed occasional snoring and apnea episodes.  Ultimately, weight loss is more beneficial however for the time being, I recommend a sleep study.  3. Morbid obesity: Weight loss recommended.   Medication Adjustments/Labs and Tests Ordered: Current medicines are reviewed at length with the patient today.  Concerns regarding medicines are outlined above.  No orders of the defined types were placed in this encounter.  No orders of the defined types were placed in this encounter.   Patient Instructions  Medication Instructions:  Your physician recommends that you continue on your current medications as directed. Please refer to the Current Medication list given to you today.  *If you need a refill on your cardiac medications before your next  appointment, please call your pharmacy*  Lab Work: NONE ordered at this time of appointment   If  you have labs (blood work) drawn today and your tests are completely normal, you will receive your results only by: Marland Kitchen MyChart Message (if you have MyChart) OR . A paper copy in the mail If you have any lab test that is abnormal or we need to change your treatment, we will call you to review the results.  Testing/Procedures: Your physician has recommended that you have a sleep study. This test records several body functions during sleep, including: brain activity, eye movement, oxygen and carbon dioxide blood levels, heart rate and rhythm, breathing rate and rhythm, the flow of air through your mouth and nose, snoring, body muscle movements, and chest and belly movement.   ZIO XT- Long Term Monitor Instructions  Your physician has requested you wear your ZIO patch monitor 14 days.   This is a single patch monitor.  [employer] supplies one patch monitor per enrollment.  Additional stickers are not available.   Please do not apply patch if you will be having a Nuclear Stress Test, Echocardiogram, Cardiac CT, MRI, or Chest Xray during the time frame you would be wearing the monitor. The patch cannot be worn during these tests.  You cannot remove and re-apply the ZIO XT patch monitor.   Your ZIO patch monitor will be sent USPS Priority mail from [facility] directly to your home address. The monitor may also be mailed to a PO BOX if home delivery is not available.   It may take 3-5 days to receive your monitor after you have been enrolled.   Once you have received you monitor, please review enclosed instructions.  Your monitor has already been registered assigning a specific monitor serial # to you.   Applying the monitor   Shave hair from upper left chest.   Hold abrader disc by orange tab.  Rub abrader in 40 strokes over left upper chest as indicated in your monitor instructions.   Clean  area with 4 enclosed alcohol pads .  Use all pads to assure are is cleaned thoroughly.  Let dry.   Apply patch as indicated in monitor instructions.  Patch will be place under collarbone on left side of chest with arrow pointing upward.   Rub patch adhesive wings for 2 minutes.Remove white label marked "1".  Remove white label marked "2".  Rub patch adhesive wings for 2 additional minutes.   While looking in a mirror, press and release button in center of patch.  A small green light will flash 3-4 times .  This will be your only indicator the monitor has been turned on.     Do not shower for the first 24 hours.  You may shower after the first 24 hours.   Press button if you feel a symptom. You will hear a small click.  Record Date, Time and Symptom in the Patient Log Book.   When you are ready to remove patch, follow instructions on last 2 pages of Patient Log Book.  Stick patch monitor onto last page of Patient Log Book.   Place Patient Log Book in [place] box.  Use locking tab on box and tape box closed securely.  The [employer] and [employer] has [employer] on it.  Please place in mailbox as soon as possible.  Your physician should have your test results approximately 7 days after the monitor has been mailed back to [facility]   Call [facility] Customer Care at [phone] if you have questions regarding your ZIO XT patch monitor.  Call  them immediately if you see an orange light blinking on your monitor.   If your monitor falls off in less than 4 days contact our Monitor department at [phone].  If your monitor becomes loose or falls off after 4 days call [employer] at [phone] for suggestions on securing your monitor.   Follow-Up: At [facility], you and your health needs are our priority.  As part of our continuing mission to provide you with exceptional heart care, we have created designated Provider Care Teams.  These Care Teams include your primary Cardiologist  (physician) and Advanced Practice Providers (APPs -  Physician Assistants and Nurse Practitioners) who all work together to provide you with the care you need, when you need it.  We recommend signing up for the patient portal called "MyChart".  Sign up information is provided on this After Visit Summary.  MyChart is used to connect with patients for Virtual Visits (Telemedicine).  Patients are able to view lab/test results, encounter notes, upcoming appointments, etc.  Non-urgent messages can be sent to your provider as well.   To learn more about what you can do with MyChart, go to [email or web address].    Your next appointment:   5-6 week(s)  The format for your next appointment:   Either In Person or Virtual  Provider:   Almyra Deforest, PA-C  Other Instructions      Signed, Almyra Deforest, [place]  02/25/2020 9:52 AM    [place]

## 2020-02-25 NOTE — Patient Instructions (Addendum)
Medication Instructions:  Your physician recommends that you continue on your current medications as directed. Please refer to the Current Medication list given to you today.  *If you need a refill on your cardiac medications before your next appointment, please call your pharmacy*  Lab Work: NONE ordered at this time of appointment   If you have labs (blood work) drawn today and your tests are completely normal, you will receive your results only by: Marland Kitchen MyChart Message (if you have MyChart) OR . A paper copy in the mail If you have any lab test that is abnormal or we need to change your treatment, we will call you to review the results.  Testing/Procedures: Your physician has recommended that you have a sleep study. This test records several body functions during sleep, including: brain activity, eye movement, oxygen and carbon dioxide blood levels, heart rate and rhythm, breathing rate and rhythm, the flow of air through your mouth and nose, snoring, body muscle movements, and chest and belly movement.   ZIO XT- Long Term Monitor Instructions  Your physician has requested you wear your ZIO patch monitor 14 days.   This is a single patch monitor.  [employer] supplies one patch monitor per enrollment.  Additional stickers are not available.   Please do not apply patch if you will be having a Nuclear Stress Test, Echocardiogram, Cardiac CT, MRI, or Chest Xray during the time frame you would be wearing the monitor. The patch cannot be worn during these tests.  You cannot remove and re-apply the ZIO XT patch monitor.   Your ZIO patch monitor will be sent USPS Priority mail from [facility] directly to your home address. The monitor may also be mailed to a PO BOX if home delivery is not available.   It may take 3-5 days to receive your monitor after you have been enrolled.   Once you have received you monitor, please review enclosed instructions.  Your monitor has already been registered  assigning a specific monitor serial # to you.   Applying the monitor   Shave hair from upper left chest.   Hold abrader disc by orange tab.  Rub abrader in 40 strokes over left upper chest as indicated in your monitor instructions.   Clean area with 4 enclosed alcohol pads .  Use all pads to assure are is cleaned thoroughly.  Let dry.   Apply patch as indicated in monitor instructions.  Patch will be place under collarbone on left side of chest with arrow pointing upward.   Rub patch adhesive wings for 2 minutes.Remove white label marked "1".  Remove white label marked "2".  Rub patch adhesive wings for 2 additional minutes.   While looking in a mirror, press and release button in center of patch.  A small green light will flash 3-4 times .  This will be your only indicator the monitor has been turned on.     Do not shower for the first 24 hours.  You may shower after the first 24 hours.   Press button if you feel a symptom. You will hear a small click.  Record Date, Time and Symptom in the Patient Log Book.   When you are ready to remove patch, follow instructions on last 2 pages of Patient Log Book.  Stick patch monitor onto last page of Patient Log Book.   Place Patient Log Book in [place] box.  Use locking tab on box and tape box closed securely.  The [place] and  White box has [employer] on it.  Please place in mailbox as soon as possible.  Your physician should have your test results approximately 7 days after the monitor has been mailed back to [facility].   Call [place] at [phone] if you have questions regarding your ZIO XT patch monitor.  Call them immediately if you see an orange light blinking on your monitor.   If your monitor falls off in less than 4 days contact our Monitor department at [phone].  If your monitor becomes loose or falls off after 4 days call [employer] at [phone] for suggestions on securing your monitor.    Follow-Up: At [facility], you and your health needs are our priority.  As part of our continuing mission to provide you with exceptional heart care, we have created designated Provider Care Teams.  These Care Teams include your primary Cardiologist (physician) and Advanced Practice Providers (APPs -  Physician Assistants and Nurse Practitioners) who all work together to provide you with the care you need, when you need it.  We recommend signing up for the patient portal called "MyChart".  Sign up information is provided on this After Visit Summary.  MyChart is used to connect with patients for Virtual Visits (Telemedicine).  Patients are able to view lab/test results, encounter notes, upcoming appointments, etc.  Non-urgent messages can be sent to your provider as well.   To learn more about what you can do with MyChart, go to [email or web address].    Your next appointment:   5-6 week(s)  The format for your next appointment:   Either In Person or Virtual  Provider:   Almyra Deforest, PA-C  Other Instructions

## 2020-02-26 NOTE — Telephone Encounter (Signed)
Left message to return a call to get sleep study appointment details. 

## 2020-03-08 ENCOUNTER — Encounter (INDEPENDENT_AMBULATORY_CARE_PROVIDER_SITE_OTHER): Payer: Medicare Other

## 2020-03-08 DIAGNOSIS — R002 Palpitations: Secondary | ICD-10-CM

## 2020-03-08 DIAGNOSIS — R55 Syncope and collapse: Secondary | ICD-10-CM

## 2020-03-09 ENCOUNTER — Telehealth (INDEPENDENT_AMBULATORY_CARE_PROVIDER_SITE_OTHER): Payer: Medicare Other | Admitting: Family Medicine

## 2020-03-09 DIAGNOSIS — F17210 Nicotine dependence, cigarettes, uncomplicated: Secondary | ICD-10-CM

## 2020-03-09 DIAGNOSIS — R29818 Other symptoms and signs involving the nervous system: Secondary | ICD-10-CM | POA: Diagnosis not present

## 2020-03-09 NOTE — Progress Notes (Signed)
[facility] Telemedicine Visit  Patient consented to have virtual visit and was identified by name and date of birth. Method of visit: Video  Encounter participants: Patient: Monica Huang - located at home Provider: Myrene Buddy - located at [facility]  Chief Complaint: Trouble breathing  HPI: 39 year old female who presents as a video visit for trouble breathing at night.  Patient states that per her partner's report she has difficulty breathing at night and intermittently wakes up really working hard to breathe.  Of note the patient has suspected sleep apnea.  She had a sleep study scheduled for 03/22/2020.  Patient is also being worked up for possible arrhythmia by cardiology.  She does have a 14-day ZIO patch monitor.  Patient states that she is having to work harder breathing is breathing more frequently.  She is waking up very tired, and a bad headache most days.  ROS: per HPI  Pertinent PMHx: Suspected sleep apnea, presyncope  Exam:  General: No acute distress, resting comfortably, able speak in clear coherent sentences Respiratory: No accessory muscle use Neuro: No focal neurologic deficits appreciated DVT  Assessment/Plan:  Suspected sleep apnea Patient's difficulty breathing at night and perhaps during the daytime is likely related to sleep apnea.  I wonder if there is perhaps some aspect of pulmonary hypertension causing the trouble breathing.  Reviewed echo from 2018 which was essentially normal aside from trivial mitral and tricuspid regurgitation.  Recommended the patient try to add a couple of pillows or perhaps even sleep upright and see if this helps her issue.  I did recommend getting her sleep study done soon as possible.  Discussed alarm precautions, when to follow-up in both clinic and the emergency department.    Time spent during visit with patient: 17 minutes

## 2020-03-10 ENCOUNTER — Ambulatory Visit: Payer: Medicare Other

## 2020-03-10 ENCOUNTER — Telehealth: Payer: Self-pay

## 2020-03-10 NOTE — Telephone Encounter (Signed)
Patient calling nurse line requesting extension on work note. Patient had a virtual visit with Dr. Primitivo Gauze yesterday (4/13). Patient reports that she is still not feeling well and would like work note for the rest of the week, with a return to work date of Monday, April 19th.   To Dr. Primitivo Gauze.   Please advise  Veronda Prude, RN

## 2020-03-11 DIAGNOSIS — R29818 Other symptoms and signs involving the nervous system: Secondary | ICD-10-CM | POA: Insufficient documentation

## 2020-03-11 NOTE — Telephone Encounter (Signed)
Will place work note at the front desk writing her out until Monday.  Myrene Buddy MD PGY-3 Family Medicine Resident

## 2020-03-11 NOTE — Assessment & Plan Note (Signed)
Patient's difficulty breathing at night and perhaps during the daytime is likely related to sleep apnea.  I wonder if there is perhaps some aspect of pulmonary hypertension causing the trouble breathing.  Reviewed echo from 2018 which was essentially normal aside from trivial mitral and tricuspid regurgitation.  Recommended the patient try to add a couple of pillows or perhaps even sleep upright and see if this helps her issue.  I did recommend getting her sleep study done soon as possible.  Discussed alarm precautions, when to follow-up in both clinic and the emergency department.

## 2020-03-15 ENCOUNTER — Ambulatory Visit: Payer: Medicare Other | Attending: Internal Medicine

## 2020-03-15 DIAGNOSIS — Z23 Encounter for immunization: Secondary | ICD-10-CM

## 2020-03-15 NOTE — Progress Notes (Signed)
   Covid-19 Vaccination Clinic  Name:  Monica Huang    MRN: [id number] DOB: 1980/12/02  03/15/2020  [name] was observed post Covid-19 immunization for 15 minutes without incident. She was provided with Vaccine Information Sheet and instruction to access the V-Safe system.   [name] was instructed to call 911 with any severe reactions post vaccine: Marland Kitchen Difficulty breathing  . Swelling of face and throat  . A fast heartbeat  . A bad rash all over body  . Dizziness and weakness   Immunizations Administered    Name Date Dose VIS Date Route   Pfizer COVID-19 Vaccine 03/15/2020  9:04 AM 0.3 mL 01/21/2019 Intramuscular   Manufacturer: [employer], [employer]   Lot: [id number]   NDC: [address]-0

## 2020-03-16 ENCOUNTER — Telehealth: Payer: Self-pay | Admitting: Family Medicine

## 2020-03-16 NOTE — Telephone Encounter (Signed)
Reviewed paperwork and placed in PCP's box for completion.  .Luann Aspinwall R Darriana Deboy, CMA' 

## 2020-03-16 NOTE — Telephone Encounter (Signed)
Papers for patients work was dropped off to be completed by doctor Primitivo Gauze. They would like papers faxed when completed. Fax number is in the envelope. Thanks

## 2020-03-17 ENCOUNTER — Telehealth: Payer: Self-pay | Admitting: Cardiovascular Disease

## 2020-03-17 NOTE — Telephone Encounter (Signed)
Dois Davenport from [employer] is calling out of curtesy to inform Dr. Tresa Endo that the patient wore her monitor for 6 days and is mailing it back because it kept falling off. Dois Davenport would like to know if they are to send another monitor to the patient. The Reference number for when calling back is [id number]. Please advise.

## 2020-03-19 ENCOUNTER — Other Ambulatory Visit (HOSPITAL_COMMUNITY)
Admission: RE | Admit: 2020-03-19 | Discharge: 2020-03-19 | Disposition: A | Discharge status: Home / Self Care | Payer: Medicare Other | Source: Ambulatory Visit | Attending: Cardiovascular Disease | Admitting: Cardiovascular Disease

## 2020-03-19 DIAGNOSIS — Z20822 Contact with and (suspected) exposure to covid-19: Secondary | ICD-10-CM | POA: Diagnosis not present

## 2020-03-19 DIAGNOSIS — Z01812 Encounter for preprocedural laboratory examination: Secondary | ICD-10-CM | POA: Insufficient documentation

## 2020-03-19 NOTE — Telephone Encounter (Signed)
Spoke to [place] and she also has not seen this paperwork. This paperwork is actually in [employer], but he is out until April 25th and Talbert Forest is covering. Not sure if it will have to wait for Primitivo Gauze to get back or if Talbert Forest can complete for him. Please update patient with this information.

## 2020-03-19 NOTE — Telephone Encounter (Signed)
Patient calling to check status of the FMLA paperwork. Told patient PCP must have the paperwork working on it since it's no longer in PCP box. Told patient she will get a call once this has been completed. Please advise.

## 2020-03-19 NOTE — Telephone Encounter (Signed)
Patient will need to wait until Dr. Kris Mouton returns as I have never met her. That should still be within the 7 business day deadline on paperwork.

## 2020-03-19 NOTE — Telephone Encounter (Signed)
Ok to analyze the 6 days; no need to send another monitor

## 2020-03-20 ENCOUNTER — Inpatient Hospital Stay: Admission: RE | Admit: 2020-03-20 | Payer: Medicare Other | Source: Ambulatory Visit

## 2020-03-20 LAB — SARS CORONAVIRUS 2 (TAT 6-24 HRS): SARS Coronavirus 2: NEGATIVE

## 2020-03-22 ENCOUNTER — Other Ambulatory Visit: Payer: Self-pay

## 2020-03-22 ENCOUNTER — Ambulatory Visit (HOSPITAL_BASED_OUTPATIENT_CLINIC_OR_DEPARTMENT_OTHER): Payer: Medicare Other | Attending: Physician Assistant | Admitting: Cardiovascular Disease

## 2020-03-22 DIAGNOSIS — G4730 Sleep apnea, unspecified: Secondary | ICD-10-CM

## 2020-03-22 DIAGNOSIS — R0902 Hypoxemia: Secondary | ICD-10-CM | POA: Diagnosis not present

## 2020-03-22 DIAGNOSIS — G4733 Obstructive sleep apnea (adult) (pediatric): Secondary | ICD-10-CM | POA: Diagnosis not present

## 2020-03-22 NOTE — Telephone Encounter (Signed)
Pt updated and verbalized understanding.  

## 2020-03-22 NOTE — Telephone Encounter (Signed)
Left message to call back  

## 2020-03-22 NOTE — Telephone Encounter (Signed)
Patient is returning call.  °

## 2020-03-23 ENCOUNTER — Telehealth: Payer: Self-pay | Admitting: Family Medicine

## 2020-03-23 NOTE — Telephone Encounter (Signed)
Pt called to inquire about FMLA forms she need completed ph # [phone]

## 2020-03-24 NOTE — Telephone Encounter (Signed)
Patient is calling to check on the status of FMLA being completed. I informed her of the messaged below. She was understanding and asked that someone please call her when the paper work has been faxed to [employer].   The best call back number for patient is [phone]

## 2020-03-24 NOTE — Telephone Encounter (Signed)
Will attempt to locate paperwork and complete to have ready for pick up tomorrow.

## 2020-03-24 NOTE — Telephone Encounter (Signed)
Will attempt to locate paperwork today and have it ready by tomorrow.

## 2020-03-25 NOTE — Telephone Encounter (Signed)
Patient is returning Dr. Jeralene Peters call. She said she asked on the vm if she would like to pick it up or have it faxed. The patient would like it to be faxed to the [employer]. She said there was a business card with the fax number attached to paperwork.

## 2020-03-25 NOTE — Telephone Encounter (Signed)
I have it in my box. I will get it filled out and sent in today.  Myrene Buddy MD PGY-3 Family Medicine Resident

## 2020-03-28 ENCOUNTER — Encounter (HOSPITAL_BASED_OUTPATIENT_CLINIC_OR_DEPARTMENT_OTHER): Payer: Self-pay | Admitting: Cardiovascular Disease

## 2020-03-28 NOTE — Procedures (Signed)
Patient Name: Monica Huang, Monica Huang Date: 03/22/2020 Gender: Female D.O.B: November 20, 1981 Age (years): 38 Referring Provider: [name] PA Height (inches): 69 Interpreting Physician: Nicki Guadalajara MD, ABSM Weight (lbs): 308 RPSGT: [place] Reveal BMI: 46 MRN: [id number] Neck Size: 16.00  CLINICAL INFORMATION Sleep Study Type: NPSG  Indication for sleep study: N/A  Epworth Sleepiness Score: 9  SLEEP STUDY TECHNIQUE As per the AASM Manual for the Scoring of Sleep and Associated Events v2.3 (April 2016) with a hypopnea requiring 4% desaturations.  The channels recorded and monitored were frontal, central and occipital EEG, electrooculogram (EOG), submentalis EMG (chin), nasal and oral airflow, thoracic and abdominal wall motion, anterior tibialis EMG, snore microphone, electrocardiogram, and pulse oximetry.  MEDICATIONS cyclobenzaprine (FLEXERIL) 10 MG tablet ibuprofen (ADVIL) 800 MG tablet Medications self-administered by patient taken the night of the study : N/A  SLEEP ARCHITECTURE The study was initiated at 9:56:10 PM and ended at 3:59:43 AM.  Sleep onset time was 46.4 minutes and the sleep efficiency was 64.6%%. The total sleep time was 235 minutes.  Stage REM latency was 58.5 minutes.  The patient spent 6.8%% of the night in stage N1 sleep, 67.7%% in stage N2 sleep, 0.0%% in stage N3 and 25.5% in REM.  Alpha intrusion was absent.  Supine sleep was 66.81%.  RESPIRATORY PARAMETERS The overall apnea/hypopnea index (AHI) was 5.1 per hour.  The respiratory disturbancer index (RDI) was 6.9/h. There were 0 total apneas, including 0 obstructive, 0 central and 0 mixed apneas. There were 20 hypopneas and 7 RERAs.  The AHI during Stage REM sleep was 19.0 per hour.  AHI while supine was 3.8 per hour.  The mean oxygen saturation was 96.2%. The minimum SpO2 during sleep was 89.0%.  Loud noring in all positions was noted during this study.  CARDIAC DATA The 2 lead  EKG demonstrated sinus rhythm. The mean heart rate was 82.2 beats per minute. Other EKG findings include: None.  LEG MOVEMENT DATA The total PLMS were 0 with a resulting PLMS index of 0.0. Associated arousal with leg movement index was 0.0 .  IMPRESSIONS - Mild obstructive sleep apnea occurred during this study (AHI 5.1/h; RDI 6.9/h); however, sleep apnea was moderate during REM sleep (AHI 19.0/h) - No significant central sleep apnea occurred during this study (CAI = 0.0/h). - The patient had minimal  desaturation during the study (Min O2 = 89.0%) - Snoring was audible during this study. - Reduced sleep efficiency at 64.6%. Wake after sleep onset (WASO) was 82.1 minutes. - No cardiac abnormalities were noted during this study. - Clinically significant periodic limb movements did not occur during sleep. No significant associated arousals.  DIAGNOSIS - Obstructive Sleep Apnea (327.23 [G47.33 ICD-10]) - Nocturnal Hypoxemia (327.26 [G47.36 ICD-10])  RECOMMENDATIONS - In this patient with significant comorbidities including significant morbid obesity and moderate sleep apnea during REM sleep, recommend CPAP therapy for optimal treatment of her sleep disordered breathing. With her blindness, recommend CPAP titration study. If unable to obtain, then consider Auto-PAP at 6 -16 cm of water. If patient is against CPAP can consider a customized oral appliance. - Effort should be made to optimize nasal and oropharyngeal patency. - Avoid alcohol, sedatives and other CNS depressants that may worsen sleep apnea and disrupt normal sleep architecture. - Sleep hygiene should be reviewed to assess factors that may improve sleep quality. - Weight management (BMI 46) and regular exercise should be initiated or continued if appropriate. - Sleep clinic evaluation if necessary to discuss  options. If CPAP instituted recommend a download in 30 days and sleep clinic evaluation.  [Electronically signed] 03/28/2020  02:57 PM  [name] MD, [facility], [place], American Board of Sleep Medicine   NPI: [id number] [place] PH: [id number]   FX: [phone] [place]

## 2020-03-31 ENCOUNTER — Encounter: Payer: Self-pay | Admitting: Physician Assistant

## 2020-03-31 ENCOUNTER — Telehealth: Payer: Self-pay | Admitting: Family Medicine

## 2020-03-31 ENCOUNTER — Other Ambulatory Visit: Payer: Self-pay

## 2020-03-31 ENCOUNTER — Ambulatory Visit (INDEPENDENT_AMBULATORY_CARE_PROVIDER_SITE_OTHER): Payer: Medicare Other | Admitting: Physician Assistant

## 2020-03-31 VITALS — BP 124/78 | HR 101 | Ht 69.0 in | Wt 305.0 lb

## 2020-03-31 DIAGNOSIS — H548 Legal blindness, as defined in USA: Secondary | ICD-10-CM

## 2020-03-31 DIAGNOSIS — R7303 Prediabetes: Secondary | ICD-10-CM

## 2020-03-31 DIAGNOSIS — R42 Dizziness and giddiness: Secondary | ICD-10-CM | POA: Diagnosis not present

## 2020-03-31 NOTE — Telephone Encounter (Signed)
Patient's finance Monica Huang came in and dropped off FMLA paperwork to be filled out and completed and faxed to the number in the envelope. Thanks

## 2020-03-31 NOTE — Telephone Encounter (Signed)
Reviewed patient's FMLA paperwork and placed in PCP's box for completion.  Glennie Hawk, CMA

## 2020-03-31 NOTE — Progress Notes (Signed)
Cardiology Office Note:    Date:  04/02/2020   ID:  Monica Huang, DOB 23-Jul-1981, MRN [id number]  PCP:  Lavonda Jumbo, DO  Cardiologist:  Nicki Guadalajara, MD  Electrophysiologist:  None   Referring MD: Lavonda Jumbo, DO   Chief Complaint  Patient presents with  . Follow-up    seen for Dr. Tresa Endo    History of Present Illness:    Mazzy Santarelli is a 39 y.o. female with a hx of morbid obesity, prediabetes, hidradenitis, chronic back pain, tobacco abuse, pseudotumor cerebri syndrome, blindness, and hyperlipidemia.  She is legally blind secondary to pseudotumor cerebri and underwent VP shunting.  Patient was previously seen in 2017 by Dr. Allyson Sabal for evaluation of presyncope.  Patient was last seen by Dr. Tresa Endo on 08/30/2017 for dizziness.  EKG obtained by PCP at the time suggested a irregular bradycardia with potential drop repeats.  Her previous dizziness seems to be orthostatic after she has been sitting for long period time.  She works as a Designer, industrial/product for the blind and unfortunately has to sit for long periods of time.  During the office visit, her orthostatic vital signs were negative.  Echocardiogram obtained on 09/17/2017 showed EF 60 to 65%, mild MR, mild TR.  A 48-hour monitor was obtained at that showed average heart rate of 88 bpm, predominantly sinus rhythm.  There were episodes of sinus bradycardia with minimum heart rate of 41, sinus tachycardia with maximum heart rate of 144.  There was a episode of Wenkebach second-degree type 1 AV block.  There were also 3 episodes of second-degree heart block with ventricular pauses >2 seconds.   More recently, patient was seen in the ED in January 2021 with palpitation.  Unfortunately she left AMA prior to work-up could be completed.  She was seen by her family medicine physician the following day, second troponin draw in the clinic was normal.  When I saw the patient on 02/25/2020, she continued to have presyncope about once every  other week.  I was worried that she might be having second-degree heart block during the episodes and the recommended 2-week ZIO monitor to further assess.  At the same time, I also recommend a sleep study as her husband mentioned she has had apnea episodes and loud snoring.  Patient presents today for reevaluation along with her husband.  Since the last office visit, she has not had any significant dizziness.  At the same time, she took some time off of work and has not needed to sit still for many hours at the time.  Otherwise she denies any recent chest pain.  Unfortunately her Zio monitor is not ready at this time, therefore I do not have the report in front of me.  Past Medical History:  Diagnosis Date  . Chronic back pain   . Chronic leg pain   . Diabetes mellitus without complication (HCC)    Type II  . Dizziness   . Hydradenitis   . Hyperlipidemia   . Legally blind   . Macular degeneration   . Pseudotumor cerebri syndrome 2005   shunt placed- and legally blind  . Sciatica     Past Surgical History:  Procedure Laterality Date  . CSF SHUNT     2 revisions  . MULTIPLE EXTRACTIONS WITH ALVEOLOPLASTY Bilateral 11/02/2017   Procedure: MULTIPLE EXTRACTION;  Surgeon: Ocie Doyne, DDS;  Location: [facility] OR;  Service: Oral Surgery;  Laterality: Bilateral;    Current Medications: Current Meds  Medication Sig  .  cyclobenzaprine (FLEXERIL) 10 MG tablet   . ibuprofen (ADVIL) 800 MG tablet Take 800 mg by mouth every 8 (eight) hours as needed.     Allergies:   Patient has no known allergies.   Social History   Socioeconomic History  . Marital status: Single    Spouse name: Not on file  . Number of children: Not on file  . Years of education: Not on file  . Highest education level: Not on file  Occupational History  . Not on file  Tobacco Use  . Smoking status: Current Every Day Smoker    Packs/day: 0.50    Types: Cigarettes  . Smokeless tobacco: Never Used  Substance and  Sexual Activity  . Alcohol use: No  . Drug use: No    Comment: marijuana -- bag per day.   Marland Kitchen Sexual activity: Yes    Birth control/protection: None    Comment: 1 partners  Other Topics Concern  . Not on file  Social History Narrative   On disability since 2005 due to vision loss from psuedotumor cerebri.   Did not finish highschool-- completed the 9th grade.    Walking 2 x week for .          Social Determinants of Health   Financial Resource Strain:   . Difficulty of Paying Living Expenses:   Food Insecurity:   . Worried About Programme researcher, broadcasting/film/video in the Last Year:   . Barista in the Last Year:   Transportation Needs:   . Freight forwarder (Medical):   Marland Kitchen Lack of Transportation (Non-Medical):   Physical Activity:   . Days of Exercise per Week:   . Minutes of Exercise per Session:   Stress:   . Feeling of Stress :   Social Connections:   . Frequency of Communication with Friends and Family:   . Frequency of Social Gatherings with Friends and Family:   . Attends Religious Services:   . Active Member of Clubs or Organizations:   . Attends Banker Meetings:   Marland Kitchen Marital Status:      Family History: The patient's family history includes Diabetes in her father and mother; Hypertension in her mother.  ROS:   Please see the history of present illness.     All other systems reviewed and are negative.  EKGs/Labs/Other Studies Reviewed:    The following studies were reviewed today:  Echo 09/17/2017 LV EF: 60% -  65%   -------------------------------------------------------------------  Indications:   R55 Syncope.   -------------------------------------------------------------------  History:  PMH: Dizziness. Blindness. Obesity. Risk factors:  Current tobacco use. Hypertension. Diabetes mellitus.   -------------------------------------------------------------------  Study Conclusions   - Left ventricle: The cavity size was  normal. Systolic function was  normal. The estimated ejection fraction was in the range of 60%  to 65%. Wall motion was normal; there were no regional wall  motion abnormalities. Left ventricular diastolic function  parameters were normal.  - Aortic valve: Trileaflet; normal thickness leaflets. There was no  regurgitation.  - Aortic root: The aortic root was normal in size.  - Mitral valve: There was mild regurgitation.  - Right ventricle: The cavity size was normal. Wall thickness was  normal. Systolic function was normal.  - Tricuspid valve: There was mild regurgitation.  - Pulmonic valve: There was no regurgitation.  - Pulmonary arteries: Systolic pressure was within the normal  range.  - Inferior vena cava: The vessel was normal in size.  - Pericardium,  extracardiac: There was no pericardial effusion.   Impressions:   - Mild mitral and tricuspid regurgitation, otherwise normal study.   EKG:  EKG is not ordered today.    Recent Labs: 12/22/2019: ALT 15; BUN 10; Creatinine, Ser 0.89; Hemoglobin 14.2; Platelets 301; Potassium 4.0; Sodium 139  Recent Lipid Panel    Component Value Date/Time   CHOL 147 03/11/2015 0841   TRIG 106 03/11/2015 0841   HDL 45 (L) 03/11/2015 0841   CHOLHDL 3.3 03/11/2015 0841   VLDL 21 03/11/2015 0841   LDLCALC 81 03/11/2015 0841   LDLDIRECT 100 (H) 01/01/2012 1155    Physical Exam:    VS:  BP 124/78   Pulse (!) 101   Ht 5\' 9"  (1.753 m)   Wt (!) 305 lb (138.3 kg)   SpO2 95%   BMI 45.04 kg/m     Wt Readings from Last 3 Encounters:  03/31/20 (!) 305 lb (138.3 kg)  03/22/20 (!) 308 lb (139.7 kg)  02/25/20 (!) 313 lb 12.8 oz (142.3 kg)     GEN:  Well nourished, well developed in no acute distress HEENT: Normal NECK: No JVD; No carotid bruits LYMPHATICS: No lymphadenopathy CARDIAC: RRR, no murmurs, rubs, gallops RESPIRATORY:  Clear to auscultation without rales, wheezing or rhonchi  ABDOMEN: Soft, non-tender,  non-distended MUSCULOSKELETAL:  No edema; No deformity  SKIN: Warm and dry NEUROLOGIC:  Alert and oriented x 3 PSYCHIATRIC:  Normal affect   ASSESSMENT:    1. Dizziness   2. Prediabetes   3. Morbid obesity (HCC)   4. Legally blind    PLAN:    In order of problems listed above:  1. Dizziness: Since the last office visit, she has decided to take some time off of work.  Interestingly, during this time she has not had any significant dizziness or presyncope.  I suspect her previous dizziness is more orthostatic related to trying to get up after long hours of sitting.   2. Prediabetes: Managed by primary care provider  3. Morbid obesity: Weight loss imperative to help with the dizziness  4. Legally blind: Related to pseudotumor cerebri   Medication Adjustments/Labs and Tests Ordered: Current medicines are reviewed at length with the patient today.  Concerns regarding medicines are outlined above.  No orders of the defined types were placed in this encounter.  No orders of the defined types were placed in this encounter.   Patient Instructions  Medication Instructions:  Your physician recommends that you continue on your current medications as directed. Please refer to the Current Medication list given to you today.  *If you need a refill on your cardiac medications before your next appointment, please call your pharmacy*  Lab Work: NONE ordered at this time of appointment   If you have labs (blood work) drawn today and your tests are completely normal, you will receive your results only by: 02/27/20 MyChart Message (if you have MyChart) OR . A paper copy in the mail If you have any lab test that is abnormal or we need to change your treatment, we will call you to review the results.  Testing/Procedures: NONE ordered at this time of appointment   Follow-Up: At [facility], you and your health needs are our priority.  As part of our continuing mission to provide you with  exceptional heart care, we have created designated Provider Care Teams.  These Care Teams include your primary Cardiologist (physician) and Advanced Practice Providers (APPs -  Physician Assistants and Nurse Practitioners) who  all work together to provide you with the care you need, when you need it.  Your next appointment:   3 month(s)  The format for your next appointment:   In Person  Provider:   [name], MD  Other Instructions      Signed, Almyra Deforest, [place]  04/02/2020 11:10 PM    [place]

## 2020-03-31 NOTE — Patient Instructions (Signed)
Medication Instructions:  °Your physician recommends that you continue on your current medications as directed. Please refer to the Current Medication list given to you today. ° °*If you need a refill on your cardiac medications before your next appointment, please call your pharmacy* ° °Lab Work: °NONE ordered at this time of appointment  ° °If you have labs (blood work) drawn today and your tests are completely normal, you will receive your results only by: °• MyChart Message (if you have MyChart) OR °• A paper copy in the mail °If you have any lab test that is abnormal or we need to change your treatment, we will call you to review the results. ° °Testing/Procedures: °NONE ordered at this time of appointment  ° °Follow-Up: °At [facility], you and your health needs are our priority.  As part of our continuing mission to provide you with exceptional heart care, we have created designated Provider Care Teams.  These Care Teams include your primary Cardiologist (physician) and Advanced Practice Providers (APPs -  Physician Assistants and Nurse Practitioners) who all work together to provide you with the care you need, when you need it. ° °Your next appointment:   °3 month(s) ° °The format for your next appointment:   °In Person ° °Provider:   °Thomas Kelly, MD ° °Other Instructions ° ° °

## 2020-03-31 NOTE — Progress Notes (Signed)
   Primary Cardiologist: None (this note is for member member who accompanied the patient)  Please excuse Mr. Monica Huang from work today as he had to accompany a family member who is legally blind to the cardiology office for follow up.   Thank you  [name], PA 03/31/2020, 10:46 AM

## 2020-04-01 NOTE — Telephone Encounter (Signed)
Patient is returning Dr. Cherlynn Polo - Lott's phone call. She explained that she requested to have the short term filled out because the last FMLA paper work she had completed wrote her out until July. She does not want to be out of work that long and asks she be written out until 04/06/20.  Please call pt back to discuss.

## 2020-04-01 NOTE — Telephone Encounter (Signed)
Attempted to call pt. This morning to get a better understanding of what we are filling out the short term disability form for. No answer. LVM for patient to call back with an explanation. Thank you.

## 2020-04-01 NOTE — Telephone Encounter (Signed)
Called patient. No answer. Left voicemail asking patient to called back to clarify. FMLA paperwork dates should have only extended to after she received her sleep study. I do not recalled writing her out until July.   Dr. Salvadore Dom, DO

## 2020-04-02 ENCOUNTER — Encounter: Payer: Self-pay | Admitting: Physician Assistant

## 2020-04-05 ENCOUNTER — Telehealth: Payer: Self-pay | Admitting: Family Medicine

## 2020-04-05 NOTE — Telephone Encounter (Signed)
Pt would like her FMLA paper to state that she can return to work tomorrow. Please call her to discuss. jw

## 2020-04-05 NOTE — Telephone Encounter (Signed)
Placed in triage box

## 2020-04-05 NOTE — Telephone Encounter (Signed)
The form was placed in the RN triage box this afternoon

## 2020-04-05 NOTE — Telephone Encounter (Signed)
Patient is calling to check and see if her papers have been faxed over to her work. She would like to be called when it is complete and she also has a question for the doctor as well. Thanks

## 2020-04-06 NOTE — Telephone Encounter (Signed)
From faxed to number provided and a copy was made for batch scanning.

## 2020-04-09 NOTE — Telephone Encounter (Signed)
Pt calls back.  Since the FMLA and STD form had different "back to work" days, they are requiring a letter stating a specific date she can return to work.  Advised that we may not be able to get this till Monday when PCP is back in clinic.  Pt would like to pick up the letter in person. Jone Baseman, CMA

## 2020-04-12 ENCOUNTER — Encounter: Payer: Self-pay | Admitting: Family Medicine

## 2020-04-12 NOTE — Telephone Encounter (Signed)
LMOVM informing pt. Ela Moffat, CMA  

## 2020-04-12 NOTE — Telephone Encounter (Signed)
Pt is calling to see if the doctor has had a chance to write the letter so that she can return to work. jw

## 2020-04-12 NOTE — Telephone Encounter (Signed)
Letter available in front office for patient to pick up. States she was able to return to work 04/06/2020 according to the form that was filled out last week.

## 2020-04-12 NOTE — Progress Notes (Signed)
Patient called. LVM to inform letter to return to work was printed and placed in front office for pick up.   Dr. Salvadore Dom, DO

## 2020-04-21 ENCOUNTER — Ambulatory Visit: Payer: Medicare Other | Admitting: Family Medicine

## 2020-04-28 ENCOUNTER — Other Ambulatory Visit: Payer: Self-pay

## 2020-04-28 ENCOUNTER — Ambulatory Visit (INDEPENDENT_AMBULATORY_CARE_PROVIDER_SITE_OTHER): Payer: Medicare Other | Admitting: Family Medicine

## 2020-04-28 VITALS — BP 120/80 | HR 101 | Ht 68.0 in | Wt 308.2 lb

## 2020-04-28 DIAGNOSIS — L732 Hidradenitis suppurativa: Secondary | ICD-10-CM

## 2020-04-28 MED ORDER — CLINDAMYCIN HCL 300 MG PO CAPS: 300.0000 mg | ORAL_CAPSULE | Freq: Two times a day (BID) | ORAL | 0 refills | Status: AC

## 2020-04-28 MED ORDER — CLINDAMYCIN PHOSPHATE 1 % EX GEL: Freq: Two times a day (BID) | CUTANEOUS | 0 refills | Status: DC

## 2020-04-28 NOTE — Progress Notes (Signed)
    SUBJECTIVE:   CHIEF COMPLAINT / HPI:   Boil under left arm Patient presenting with "boil under" her left arm. Was seeing dermatology at [facility] but her dermatologist left clinic. Saw better care who gave her doxycycline and anther tetracycline. She gets these often but this one is not healing. It is under where her bra is and she feels that it irritates her. There is drainage. No fevers. Usually gets boils in her groin, it has been years since she had them under her arm. States previous dermatologist used to use clindamycin PO + topical which would clear up her flares. Her dermatologist did discuss possible injectable medications but patient does not want to try this.   PERTINENT  PMH / PSH: HTN, prediabetes, hidradenitis suppurativa, obesity, legally blind   OBJECTIVE:   BP 120/80   Pulse (!) 101   Ht 5\' 8"  (1.727 m)   Wt (!) 308 lb 3.2 oz (139.8 kg)   LMP 04/07/2020   SpO2 100%   BMI 46.86 kg/m   Gen: awake and alert, NAD Skin: inflamed boil under left arm, pustular drainage expressed from area, no skin erythema surrounding   ASSESSMENT/PLAN:   Hidradenitis suppurativa Patient's symptoms and physical exam consistent with hidradenitis suppurativa.  This seems to be a chronic problem for patient.  Has failed oral doxycycline.  In the past her dermatologist has placed her on oral clindamycin with topical clindamycin which seems to help.  We will do this at this time.  Very tender to exam so will avoid I&D at this time.  Patient would like a more long-term solution so we discussed surgical fixation.  She would like a referral to a surgeon to discuss this.  Referral placed.  Follow-up if no improvement with antibiotic course.     06/07/2020, DO [facility]

## 2020-04-28 NOTE — Patient Instructions (Signed)
Hidradenitis Suppurativa Hidradenitis suppurativa is a long-term (chronic) skin disease. It is similar to a severe form of acne, but it affects areas of the body where acne would be unusual, especially areas of the body where skin rubs against skin and becomes moist. These include:  Underarms.  Groin.  Genital area.  Buttocks.  Upper thighs.  Breasts. Hidradenitis suppurativa may start out as small lumps or pimples caused by blocked sweat glands or hair follicles. Pimples may develop into deep sores that break open (rupture) and drain pus. Over time, affected areas of skin may thicken and become scarred. This condition is rare and does not spread from person to person (non-contagious). What are the causes? The exact cause of this condition is not known. It may be related to:  Female and female hormones.  An overactive disease-fighting system (immune system). The immune system may over-react to blocked hair follicles or sweat glands and cause swelling and pus-filled sores. What increases the risk? You are more likely to develop this condition if you:  Are female.  Are 11-55 years old.  Have a family history of hidradenitis suppurativa.  Have a personal history of acne.  Are overweight.  Smoke.  Take the medicine lithium. What are the signs or symptoms? The first symptoms are usually painful bumps in the skin, similar to pimples. The condition may get worse over time (progress), or it may only cause mild symptoms. If the disease progresses, symptoms may include:  Skin bumps getting bigger and growing deeper into the skin.  Bumps rupturing and draining pus.  Itchy, infected skin.  Skin getting thicker and scarred.  Tunnels under the skin (fistulas) where pus drains from a bump.  Pain during daily activities, such as pain during walking if your groin area is affected.  Emotional problems, such as stress or depression. This condition may affect your appearance and your  ability or willingness to wear certain clothes or do certain activities. How is this diagnosed? This condition is diagnosed by a health care provider who specializes in skin diseases (dermatologist). You may be diagnosed based on:  Your symptoms and medical history.  A physical exam.  Testing a pus sample for infection.  Blood tests. How is this treated? Your treatment will depend on how severe your symptoms are. The same treatment will not work for everybody with this condition. You may need to try several treatments to find what works best for you. Treatment may include:  Cleaning and bandaging (dressing) your wounds as needed.  Lifestyle changes, such as new skin care routines.  Taking medicines, such as: ? Antibiotics. ? Acne medicines. ? Medicines to reduce the activity of the immune system. ? A diabetes medicine (metformin). ? Birth control pills, for women. ? Steroids to reduce swelling and pain.  Working with a mental health care provider, if you experience emotional distress due to this condition. If you have severe symptoms that do not get better with medicine, you may need surgery. Surgery may involve:  Using a laser to clear the skin and remove hair follicles.  Opening and draining deep sores.  Removing the areas of skin that are diseased and scarred. Follow these instructions at home: Medicines   Take over-the-counter and prescription medicines only as told by your health care provider.  If you were prescribed an antibiotic medicine, take it as told by your health care provider. Do not stop taking the antibiotic even if your condition improves. Skin care  If you have open wounds, cover   them with a clean dressing as told by your health care provider. Keep wounds clean by washing them gently with soap and water when you bathe.  Do not shave the areas where you get hidradenitis suppurativa.  Do not wear deodorant.  Wear loose-fitting clothes.  Try to avoid  getting overheated or sweaty. If you get sweaty or wet, change into clean, dry clothes as soon as you can.  To help relieve pain and itchiness, cover sore areas with a warm, clean washcloth (warm compress) for 5-10 minutes as often as needed.  If told by your health care provider, take a bleach bath twice a week: ? Fill your bathtub halfway with water. ? Pour in  cup of unscented household bleach. ? Soak in the tub for 5-10 minutes. ? Only soak from the neck down. Avoid water on your face and hair. ? Shower to rinse off the bleach from your skin. General instructions  Learn as much as you can about your disease so that you have an active role in your treatment. Work closely with your health care provider to find treatments that work for you.  If you are overweight, work with your health care provider to lose weight as recommended.  Do not use any products that contain nicotine or tobacco, such as cigarettes and e-cigarettes. If you need help quitting, ask your health care provider.  If you struggle with living with this condition, talk with your health care provider or work with a mental health care provider as recommended.  Keep all follow-up visits as told by your health care provider. This is important. Where to find more information  Hidradenitis Suppurativa Foundation, Inc.: https://www.hs-foundation.org/ Contact a health care provider if you have:  A flare-up of hidradenitis suppurativa.  A fever or chills.  Trouble controlling your symptoms at home.  Trouble doing your daily activities because of your symptoms.  Trouble dealing with emotional problems related to your condition. Summary  Hidradenitis suppurativa is a long-term (chronic) skin disease. It is similar to a severe form of acne, but it affects areas of the body where acne would be unusual.  The first symptoms are usually painful bumps in the skin, similar to pimples. The condition may get worse over time  (progress), or it may only cause mild symptoms.  If you have open wounds, cover them with a clean dressing as told by your health care provider. Keep wounds clean by washing them gently with soap and water when you bathe.  Besides skin care, treatment may include medicines, laser treatment, and surgery. This information is not intended to replace advice given to you by your health care provider. Make sure you discuss any questions you have with your health care provider. Document Revised: 11/21/2017 Document Reviewed: 11/21/2017 Elsevier Patient Education  2020 Elsevier Inc.  

## 2020-04-28 NOTE — Assessment & Plan Note (Signed)
Patient's symptoms and physical exam consistent with hidradenitis suppurativa.  This seems to be a chronic problem for patient.  Has failed oral doxycycline.  In the past her dermatologist has placed her on oral clindamycin with topical clindamycin which seems to help.  We will do this at this time.  Very tender to exam so will avoid I&D at this time.  Patient would like a more long-term solution so we discussed surgical fixation.  She would like a referral to a surgeon to discuss this.  Referral placed.  Follow-up if no improvement with antibiotic course.

## 2020-05-06 ENCOUNTER — Other Ambulatory Visit: Payer: Self-pay

## 2020-05-06 ENCOUNTER — Ambulatory Visit (INDEPENDENT_AMBULATORY_CARE_PROVIDER_SITE_OTHER): Payer: Medicare Other | Admitting: Family Medicine

## 2020-05-06 VITALS — BP 110/64 | HR 84 | Ht 68.0 in | Wt 308.0 lb

## 2020-05-06 DIAGNOSIS — M5412 Radiculopathy, cervical region: Secondary | ICD-10-CM | POA: Diagnosis not present

## 2020-05-06 NOTE — Patient Instructions (Addendum)
Lovely to see you today!! I have reordered your neck MRI for your neck pain and numbness and tingling. Please be sure to follow up with this appointment. After this result you can follow up with the Orthopedist. Please keep the left arm and shoulder moving as it helps with your symptoms.   Please follow up with your PCP for the MRI result.   Best wishes,  Dr Allena Katz

## 2020-05-07 ENCOUNTER — Telehealth: Payer: Self-pay | Admitting: *Deleted

## 2020-05-07 NOTE — Telephone Encounter (Signed)
-----   Message from Lennette Bihari, MD sent at 03/28/2020  3:03 PM EDT ----- Burna Mortimer, please notify pt and set up for CPAP. If unable to obtain in-lab then Auto-PAP. If pt against , then sleep eval to discuss options.

## 2020-05-07 NOTE — Telephone Encounter (Signed)
Called results lmtcb. 

## 2020-05-09 NOTE — Assessment & Plan Note (Signed)
Unlikely that pain is originating from shoulder joint given that shoulder exam was normal so less likely to be adhesive capsulitis, OA etc. More likely to be cervical pathology from known c5-c6 degenerative changes. Pt has already been approved from VP shunt standpoint. -Encouraged patient to keep arm moving and do gentle stretches -Reordered MRI spine w/o contrast -Follow up with PCP after results -Consider referral to Orthopedics

## 2020-05-09 NOTE — Progress Notes (Signed)
    SUBJECTIVE:   CHIEF COMPLAINT / HPI:   Monica Huang is a 39 yr old female who presents today for a cervical pain follow up  Cervical pain  Pt endorses left cervical pain radiating down left arm. It is a chronic pain which has been on going over the past few years. Between 7-10/10 severity. Relieved by movement and exacerbated by rest. Associated symptoms include parasthesia all the way down the arm. Has seen other providers in [facility] and is waiting for MRI neck which was scheduled for April 2021 which patient missed by mistake. She has tried to called to reschedule it but she needs a new referral. Pt reports weakness. No myalgias.  PERTINENT  PMH / PSH: Carpal tunnel, VP shunt, HTN   OBJECTIVE:   BP 110/64   Pulse 84   Ht 5\' 8"  (1.727 m)   Wt (!) 308 lb (139.7 kg)   LMP 05/04/2020   SpO2 98%   BMI 46.83 kg/m    General: Alert, pleasant, no acute distress HEENT: No c-spine tenderness, tenderness on palpation of left cervical paraspinal muscles Cardio: Normal S1 and S2, RRR Pulm: CTAB, normal WOB  Abdomen: Bowel sounds normal. Abdomen soft and non-tender.  MSK: No obvious shoulder deformity, normal active and passive ROM of shoulder joint bilaterally Extremities: 5/5 strength in upper extremities bilaterally. Normal sensations. DTR 2+. No peripheral edema. Warm/ well perfused.  Strong radial pulse Neuro: Cranial nerves grossly intact  ASSESSMENT/PLAN:   Cervical radiculitis Unlikely that pain is originating from shoulder joint given that shoulder exam was normal so less likely to be adhesive capsulitis, OA etc. More likely to be cervical pathology from known c5-c6 degenerative changes. Pt has already been approved from VP shunt standpoint. -Encouraged patient to keep arm moving and do gentle stretches -Reordered MRI spine w/o contrast -Follow up with PCP after results -Consider referral to Orthopedics      07/04/2020, MD [facility]

## 2020-05-10 ENCOUNTER — Ambulatory Visit
Admission: RE | Admit: 2020-05-10 | Discharge: 2020-05-10 | Disposition: A | Discharge status: Home / Self Care | Payer: Medicare Other | Source: Ambulatory Visit | Attending: Family Medicine | Admitting: Family Medicine

## 2020-05-10 DIAGNOSIS — M5412 Radiculopathy, cervical region: Secondary | ICD-10-CM

## 2020-05-10 IMAGING — MR MR CERVICAL SPINE W/O CM
4 of 5 series · 27 of 48 positions shown · non-contrast
Comparison: Cervical spine radiographs [DATE]

CLINICAL DATA: Neck pain. Left arm pain and right leg pain.
Numbness and weakness in left hand.

EXAM:
MRI CERVICAL SPINE WITHOUT CONTRAST
TECHNIQUE: Multiplanar, multisequence MR imaging of the cervical spine was
performed. No intravenous contrast was administered.

[Series 5: T1 · sagittal · 3.0mm · 0.66mm/px · 6 of 15 slices shown]
[im 1/15]
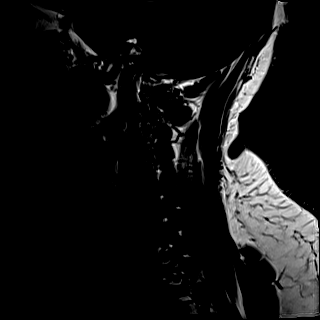
[im 3/15]
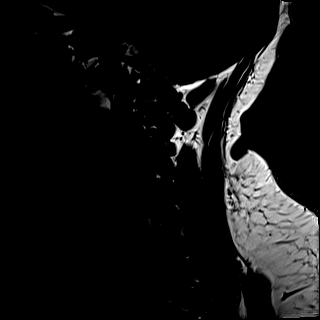
[im 6/15]
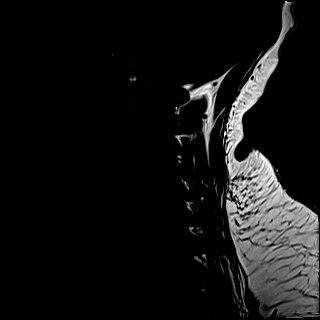
[im 9/15]
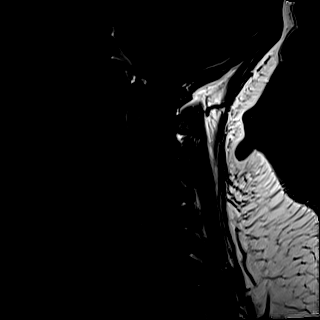
[im 12/15]
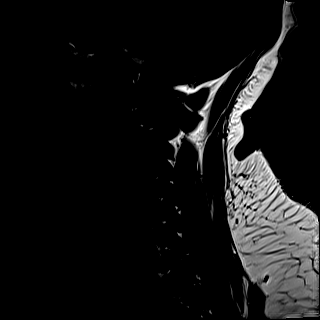
[im 15/15]
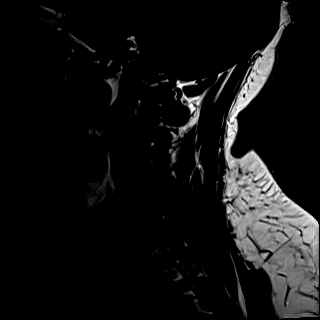

[Series 6: T2 · sagittal · 3.0mm · 0.55mm/px · 7 of 15 slices shown (1 of 2)]
[im 1/15]
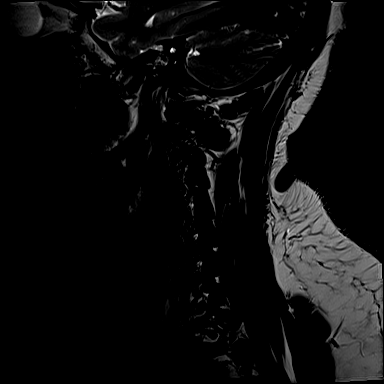
[im 3/15]
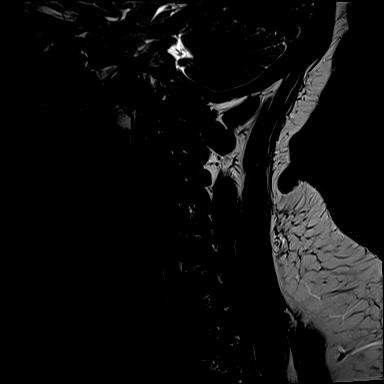
[im 5/15]
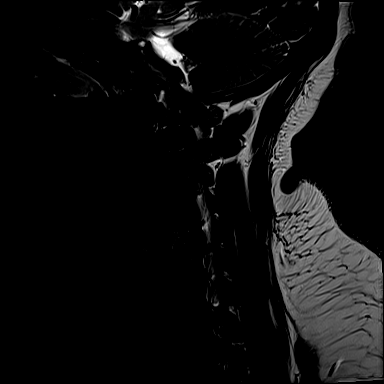
[im 8/15]
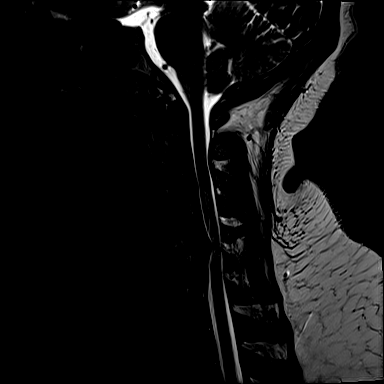
[im 10/15]
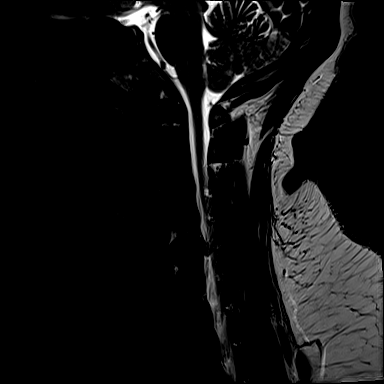
[im 12/15]
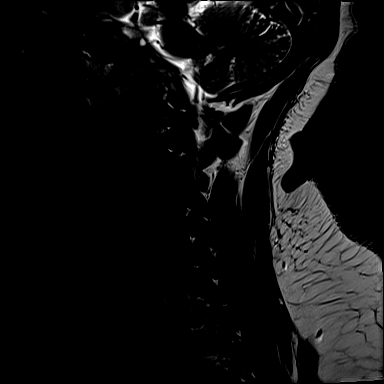
[im 15/15]
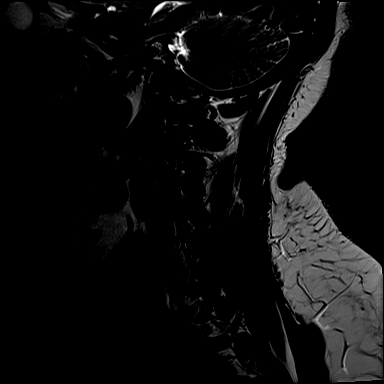

[Series 7: STIR · sagittal · 3.0mm · 0.33mm/px · 6 of 15 slices shown]
[im 1/15]
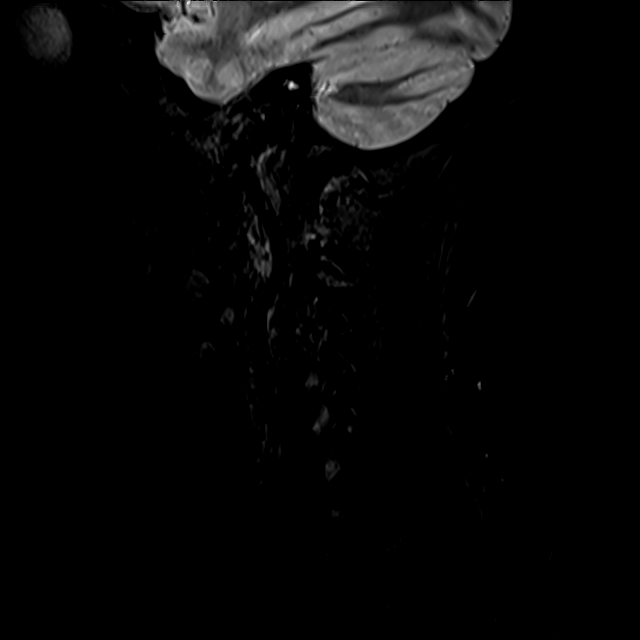
[im 3/15]
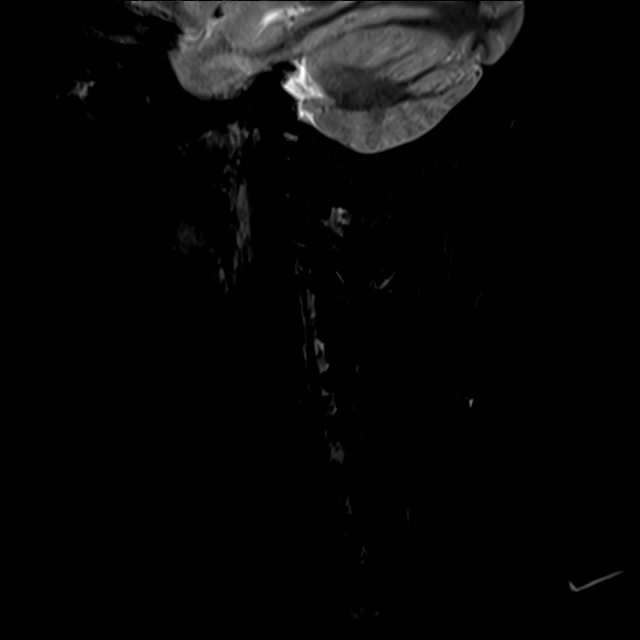
[im 5/15]
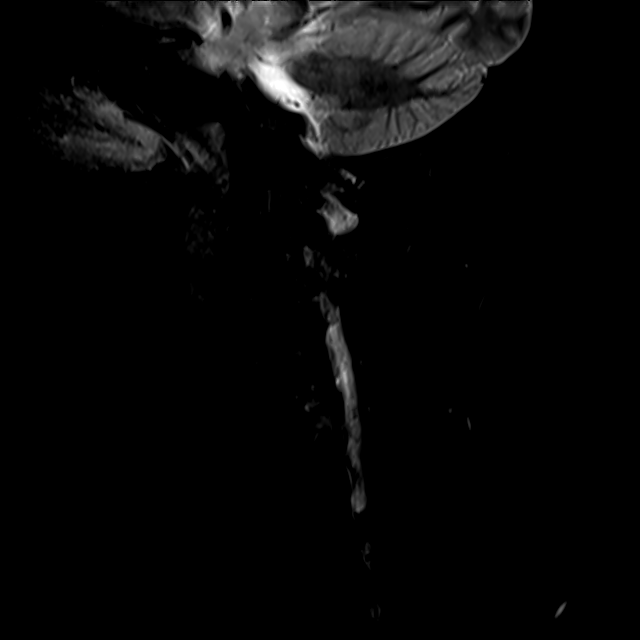
[im 8/15]
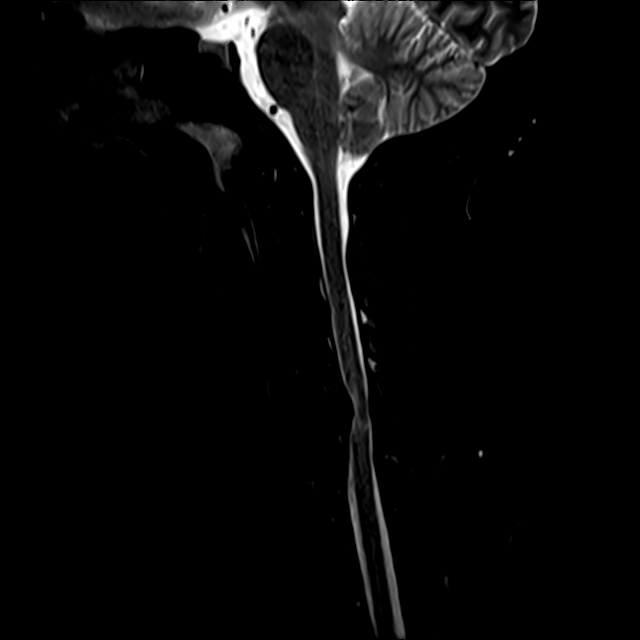
[im 10/15]
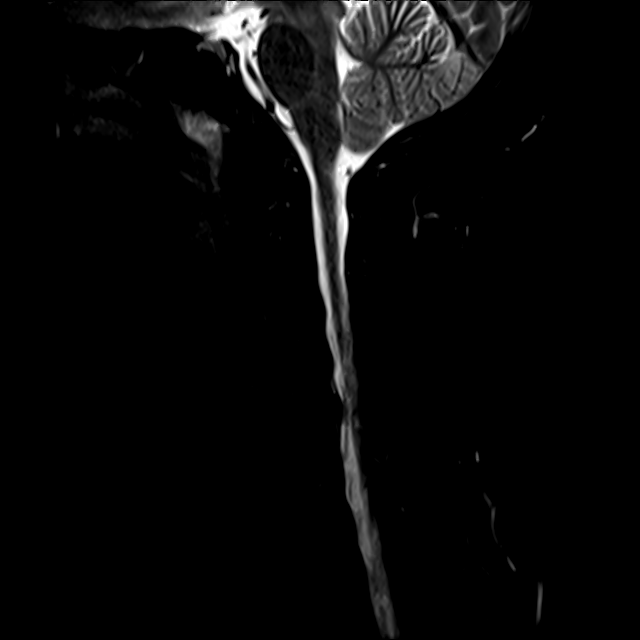
[im 12/15]
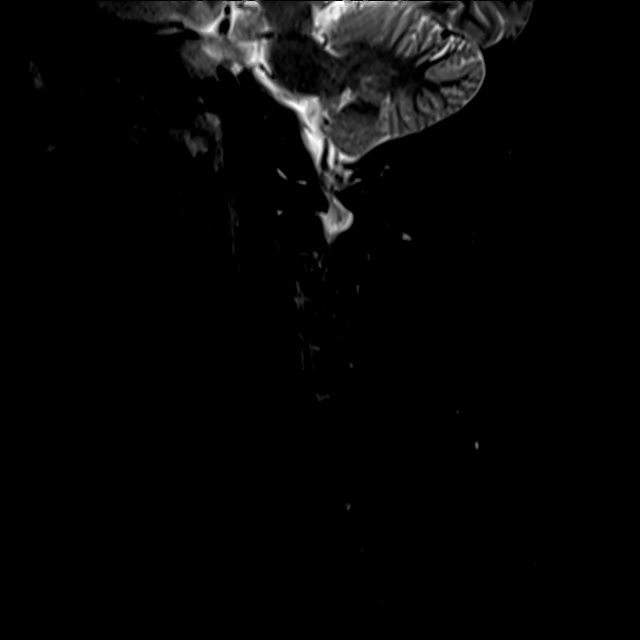

[Series 8: T2 · axial · 3.0mm · 0.50mm/px · z∈[-46,+53]mm · 8 of 32 slices shown (2 of 2)]
[im 1/32]
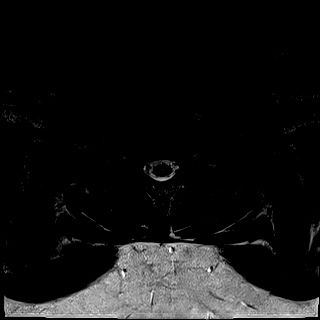
[im 5/32]
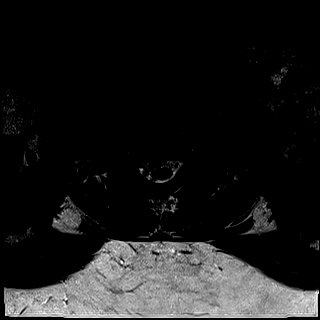
[im 10/32]
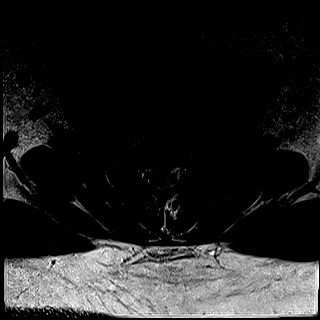
[im 15/32]
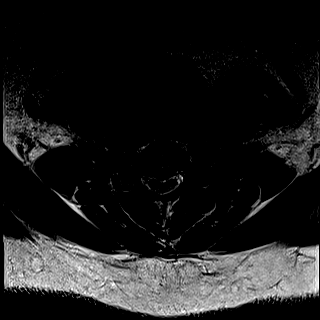
[im 17/32]
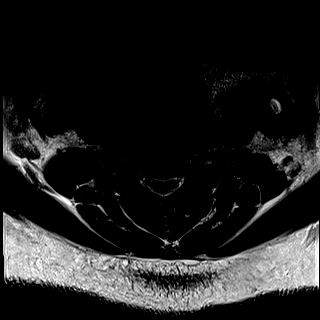
[im 22/32]
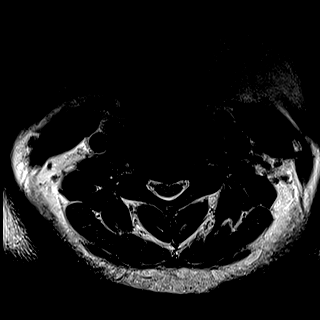
[im 27/32]
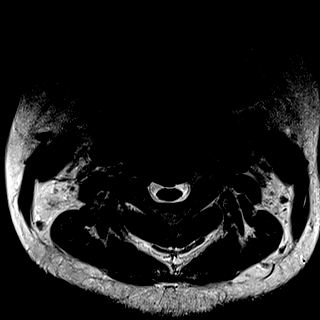
[im 32/32]
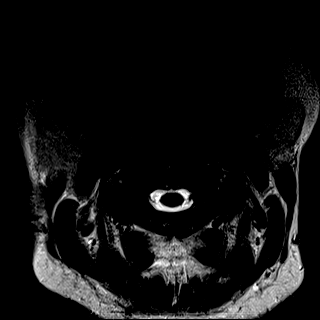

[27 of 48 positions shown; findings below may reference images not displayed]

FINDINGS: Alignment: Normal alignment with straightening of the cervical
lordosis

Vertebrae: Negative for fracture or mass.  Normal bone marrow.

Cord: Cord evaluation is limited by motion. There is increased
signal in the cord at C5-6 which appears to be related to spinal
stenosis.

Posterior Fossa, vertebral arteries, paraspinal tissues: Negative

Disc levels:

C2-3: Small central disc protrusion.  Negative for stenosis.

C3-4: Small central disc protrusion. Mild cord flattening and mild
spinal stenosis.

C4-5: Shallow central disc protrusion. Mild facet hypertrophy. Mild
spinal stenosis and moderate left foraminal encroachment due to
spurring.

C5-6: Moderate to large central disc protrusion with cord flattening
and moderate spinal stenosis. Cord hyperintensity. Mild foraminal
narrowing bilaterally

C6-7: Right paracentral disc protrusion with cord flattening on the
right. No significant spinal stenosis.

C7-T1: Negative
IMPRESSION: Multilevel degenerative change in the cervical spine as above. This
is most prominent at C5-6 where there is a central disc protrusion
causing moderate spinal stenosis and cord hyperintensity consistent
with compressive myelopathy.

## 2020-05-10 NOTE — Telephone Encounter (Signed)
Huang, Monica Carson, CMA Patient is rescheduled for 06/23/2020 for a virtual appointment.

## 2020-05-10 NOTE — Telephone Encounter (Signed)
Informed patient of sleep study results and patient understanding was verbalized. Per Dr Tresa Endo: please notify pt and set up for CPAP. If unable to obtain in-lab then Auto-PAP. If pt against , then sleep eval to discuss options.   Patient understands her sleep study showed:   IMPRESSIONS - Mild obstructive sleep apnea occurred during this study (AHI 5.1/h; RDI 6.9/h); however, sleep apnea was moderate during REM sleep (AHI 19.0/h) - The patient had minimal  desaturation during the study (Min O2 = 89.0%)  DIAGNOSIS - Obstructive Sleep Apnea (327.23 [G47.33 ICD-10]) - Nocturnal Hypoxemia (327.26 [G47.36 ICD-10])  RECOMMENDATIONS - In this patient with significant comorbidities including significant morbid obesity and moderate sleep apnea during REM sleep, recommend CPAP therapy for optimal treatment of her sleep disordered breathing. With her blindness, recommend CPAP titration study. If unable to obtain, then consider Auto-PAP at 6 -16 cm of water. If patient is against CPAP can consider a customized oral appliance.  Patient would like an appointment to discuss her sleep results with Dr Tresa Endo. I have messaged the [facility] schedulers to contact the patient in regards to an appointment.

## 2020-05-11 ENCOUNTER — Telehealth: Payer: Self-pay | Admitting: Family Medicine

## 2020-05-11 NOTE — Telephone Encounter (Signed)
Left VM to discuss MRI results. I will try again later.

## 2020-05-11 NOTE — Telephone Encounter (Signed)
Patient returning Patel's call. Please call patient back.

## 2020-05-12 ENCOUNTER — Other Ambulatory Visit: Payer: Self-pay | Admitting: Family Medicine

## 2020-05-12 ENCOUNTER — Encounter: Payer: Self-pay | Admitting: Family Medicine

## 2020-05-12 ENCOUNTER — Telehealth: Payer: Self-pay | Admitting: Family Medicine

## 2020-05-12 DIAGNOSIS — G9520 Unspecified cord compression: Secondary | ICD-10-CM

## 2020-05-12 NOTE — Telephone Encounter (Signed)
Called pt to inform her of her MRI results. I said I would send an urgent referral to orthopedics and she should follow up with her PCP.

## 2020-05-12 NOTE — Telephone Encounter (Signed)
I called Monica Huang this morning regarding her MRI results.

## 2020-05-13 ENCOUNTER — Other Ambulatory Visit: Payer: Self-pay

## 2020-05-13 ENCOUNTER — Encounter: Payer: Self-pay | Admitting: Specialist

## 2020-05-13 ENCOUNTER — Ambulatory Visit (INDEPENDENT_AMBULATORY_CARE_PROVIDER_SITE_OTHER): Payer: Medicare Other | Admitting: Specialist

## 2020-05-13 VITALS — BP 126/84 | HR 87 | Ht 68.0 in | Wt 308.0 lb

## 2020-05-13 DIAGNOSIS — M5010 Cervical disc disorder with radiculopathy, unspecified cervical region: Secondary | ICD-10-CM

## 2020-05-13 DIAGNOSIS — M4712 Other spondylosis with myelopathy, cervical region: Secondary | ICD-10-CM | POA: Diagnosis not present

## 2020-05-13 MED ORDER — METHYLPREDNISOLONE 4 MG PO TBPK: ORAL_TABLET | ORAL | 0 refills | Status: DC

## 2020-05-13 MED ORDER — HYDROCODONE-ACETAMINOPHEN 5-325 MG PO TABS: 1.0000 | ORAL_TABLET | Freq: Four times a day (QID) | ORAL | 0 refills | Status: DC | PRN

## 2020-05-13 MED ORDER — GABAPENTIN 300 MG PO CAPS: 300.0000 mg | ORAL_CAPSULE | Freq: Every day | ORAL | 1 refills | Status: DC

## 2020-05-13 NOTE — Patient Instructions (Addendum)
Avoid overhead lifting and overhead use of the arms. Do not lift greater than 5 lbs. Adjust head rest in vehicle to prevent hyperextension if rear ended. Take extra precautions to avoid falling, including use of a cane if you feel weak. Scheduling secretary Tivis Ringer. will call you to arrange for surgery for your cervical spine. If you wish a second opinion please let us know and we can arrange for you. If you have worsening arm or leg numbness or weakness please call or go to an ER. We will contact your cardiologist and primary care physicians to seek clearance for your surgery. Surgery will be an anterior cervical discectomy and fusion at the C5-6 level with decompression of the cervical spinal canal C5-6 and removal of disc pressing on the spinal cord. Fusion then at C5-6l with bone grafting and plate and screws, Local bone graft as well and allograft bone graft and vivigen. Intraop neuromonitoring. Risks of surgery include risks of infection, bleeding and risks to the spinal cord and  Risks of sore throat and difficulty swallowing which should  Improve over the next 4-6 weeks following surgery. Surgery is indicated due to upper extremity radiculopathy, lermittes phenomena and falls. In the future surgery at adjacent levels may be necessary but these levels do not appear to be related to your current symptoms or signs.

## 2020-05-13 NOTE — Progress Notes (Addendum)
Office Visit Note   Patient: Monica Huang           Date of Birth: 1981-02-06           MRN: [id number] Visit Date: 05/13/2020              Requested by: Moses Manners, MD [address] [place],  [place] [address] PCP: Lavonda Jumbo, DO   Assessment & Plan: Visit Diagnoses:  1. Herniation of cervical intervertebral disc with radiculopathy   2. Other spondylosis with myelopathy, cervical region   39 year old right handed female with cervicalgia and left arm weakness, recent MRI with C5-6 disc herniation centrally with Cord compression AP diameter canal 6 mm. MRI show myelopathic changes of cord at the level of max cord compression. Has some  Degree of disc change at C3-4 through C6-7 but only mild canal narrowing associated with all levels but C5-6 where cord changed are Significant. Will schedule for ACDF C5-6 with cord decompression and intraoperative spinal cord monitoring. Medrol dose pak, gabapentin and hydrocodone. Past history of CSF shunt for Pseudotumor cerebri at [facility] in her early 68s with residual corticoblindness.   Plan: Avoid overhead lifting and overhead use of the arms. Do not lift greater than 5 lbs. Adjust head rest in vehicle to prevent hyperextension if rear ended. Take extra precautions to avoid falling, including use of a cane if you feel weak. Scheduling secretary Tivis Ringer. will call you to arrange for surgery for your cervical spine. If you wish a second opinion please let us know and we can arrange for you. If you have worsening arm or leg numbness or weakness please call or go to an ER. We will contact your cardiologist and primary care physicians to seek clearance for your surgery. Surgery will be an anterior cervical discectomy and fusion at the C5-6 level with decompression of the cervical spinal canal C5-6 and removal of disc pressing on the spinal cord. Fusion then at C5-6l with bone grafting and plate and screws, Local bone  graft as well and allograft bone graft and vivigen. Intraop neuromonitoring. Risks of surgery include risks of infection, bleeding and risks to the spinal cord and  Risks of sore throat and difficulty swallowing which should  Improve over the next 4-6 weeks following surgery. Surgery is indicated due to upper extremity radiculopathy, lermittes phenomena and falls. In the future surgery at adjacent levels may be necessary but these levels do not appear to be related to your current symptoms or signs.      Result History  MR Cervical Spine Wo Contrast (Order #[id number]) on 05/10/2020 - Order Result History Report MyChart Results Release  MyChart Status: Active Result     Follow-Up Instructions: No follow-ups on file.   Orders:  No orders of the defined types were placed in this encounter.  Meds ordered this encounter  Medications   methylPREDNISolone (MEDROL DOSEPAK) 4 MG TBPK tablet    Sig: Take as directed, 6 day dose pak.    Dispense:  21 tablet    Refill:  0   gabapentin (NEURONTIN) 300 MG capsule    Sig: Take 1 capsule (300 mg total) by mouth at bedtime.    Dispense:  30 capsule    Refill:  1   HYDROcodone-acetaminophen (NORCO/VICODIN) 5-325 MG tablet    Sig: Take 1 tablet by mouth every 6 (six) hours as needed for moderate pain.    Dispense:  30 tablet    Refill:  0  Procedures: No procedures performed   Clinical Data: Findings:  Narrative & Impression CLINICAL DATA:  Neck pain. Left arm pain and right leg pain. Numbness and weakness in left hand.  EXAM: MRI CERVICAL SPINE WITHOUT CONTRAST  TECHNIQUE: Multiplanar, multisequence MR imaging of the cervical spine was performed. No intravenous contrast was administered.  COMPARISON:  Cervical spine radiographs 02/27/2019  FINDINGS: Alignment: Normal alignment with straightening of the cervical lordosis  Vertebrae: Negative for fracture or mass.  Normal bone marrow.  Cord: Cord evaluation is  limited by motion. There is increased signal in the cord at C5-6 which appears to be related to spinal stenosis.  Posterior Fossa, vertebral arteries, paraspinal tissues: Negative  Disc levels:  C2-3: Small central disc protrusion.  Negative for stenosis.  C3-4: Small central disc protrusion. Mild cord flattening and mild spinal stenosis.  C4-5: Shallow central disc protrusion. Mild facet hypertrophy. Mild spinal stenosis and moderate left foraminal encroachment due to spurring.  C5-6: Moderate to large central disc protrusion with cord flattening and moderate spinal stenosis. Cord hyperintensity. Mild foraminal narrowing bilaterally  C6-7: Right paracentral disc protrusion with cord flattening on the right. No significant spinal stenosis.  C7-T1: Negative  IMPRESSION: Multilevel degenerative change in the cervical spine as above. This is most prominent at C5-6 where there is a central disc protrusion causing moderate spinal stenosis and cord hyperintensity consistent with compressive myelopathy.   Electronically Signed   By: Franchot Gallo M.D.   On: 05/10/2020 21:47  Result History  MR Cervical Spine Wo Contrast (Order #[id number]) on 05/10/2020 - Order Result History Report MyChart Results Release  MyChart Status: Active Result      Subjective: Chief Complaint  Patient presents with   Neck - Pain   Left Arm - Numbness, Tingling    39 year old right handed female with history of neck pain and left hand numbness, with right leg weakness and paralysis. She has been experiencing pain into the left neck and left arm. The left shoulder and arm pain is more than neck pain and there is not as much neck pain. There is pain at times in the neck with side ways turning and there is no grating with ROM. No bowel or bladder difficulty. Has had a CSF shunt placed at age 13 for pseudotumor cerebrii. Prior to shunt severe headaches and pain especially at night.  She I having trouble with  Dropping items with the left arm and the arm seems like it is out of control. There has been numbness for the last 2-3 months. She reports having numbness in the front and back of the whole left hand present from the wrist down. She is able to walk okay but standing still she feels numbness and tingling into the right leg. In the past she has been treated for sciatica in the right leg with gabapentin and pain gradually went away.     Review of Systems  Constitutional: Negative.   HENT: Negative.   Eyes: Positive for visual disturbance. Negative for photophobia, pain, discharge, redness and itching.  Respiratory: Positive for shortness of breath and wheezing. Negative for apnea, cough, choking, chest tightness and stridor.   Cardiovascular: Positive for chest pain. Negative for palpitations and leg swelling.  Gastrointestinal: Negative.  Negative for abdominal distention, abdominal pain, anal bleeding, blood in stool, constipation and diarrhea.  Endocrine: Negative.  Negative for cold intolerance, heat intolerance, polydipsia, polyphagia and polyuria.  Genitourinary: Positive for decreased urine volume. Negative for dyspareunia, dysuria, enuresis, flank  pain, frequency, genital sores, hematuria, menstrual problem, pelvic pain and urgency.  Musculoskeletal: Positive for back pain, neck pain and neck stiffness. Negative for arthralgias, gait problem, joint swelling and myalgias.  Skin: Negative.   Allergic/Immunologic: Negative.  Negative for environmental allergies, food allergies and immunocompromised state.  Neurological: Positive for dizziness, syncope, weakness, light-headedness and numbness. Negative for tremors, seizures, facial asymmetry, speech difficulty and headaches.  Hematological: Negative.  Negative for adenopathy. Does not bruise/bleed easily.  Psychiatric/Behavioral: Positive for sleep disturbance. Negative for agitation, behavioral problems, confusion,  decreased concentration, dysphoric mood and self-injury. The patient is not hyperactive.      Objective: Vital Signs: BP 126/84 (BP Location: Left Arm, Patient Position: Sitting)    Pulse 87    Ht 5\' 8"  (1.727 m)    Wt (!) 308 lb (139.7 kg)    LMP 05/04/2020    BMI 46.83 kg/m   Physical Exam Musculoskeletal:     Lumbar back: Negative right straight leg raise test and negative left straight leg raise test.     Back Exam   Tenderness  The patient is experiencing tenderness in the cervical.  Range of Motion  Extension: abnormal  Flexion: abnormal  Lateral bend right: abnormal  Lateral bend left: abnormal  Rotation right: abnormal  Rotation left: abnormal   Muscle Strength  Right Quadriceps:  5/5  Left Quadriceps:  5/5  Right Hamstrings:  5/5  Left Hamstrings:  5/5   Tests  Straight leg raise right: negative Straight leg raise left: negative  Reflexes  Patellar: 1/4 Achilles: 1/4 Biceps: 1/4 Babinski's sign: normal   Other  Toe walk: normal Heel walk: normal Gait: normal  Erythema: no back redness Scars: absent  Comments:  Left Triceps 4/5. Left wrist volar flexion 4/5, left finger extension 4/5, right motor is normal       Specialty Comments:  No specialty comments available.  Imaging: No results found.   PMFS History: Patient Active Problem List   Diagnosis Date Noted   Suspected sleep apnea 03/11/2020   Palpitations 12/23/2019   Chronic pain of both knees 10/23/2019   Neck and shoulder pain 07/04/2019   Close exposure to COVID-19 virus 06/25/2019   Left knee pain 06/10/2019   Viral upper respiratory infection 05/22/2019   Cervical radiculitis 03/10/2019   Prediabetes 02/06/2019   Carpal tunnel syndrome 02/03/2019   Vaginal discharge 07/05/2016   Anxiety and depression 06/10/2016   Other optic atrophy, 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   Essential hypertension, benign 05/10/2015   Dizziness 09/30/2014   Mechanical  complication-ventricular(CSF) communicating shunt (HCC) 03/05/2013   Hidradenitis suppurativa 10/15/2012   Right hand pain 02/13/2012   Tobacco abuse 01/03/2012   Legally blind 01/03/2012   Obesity 01/03/2012   Marijuana smoker 01/03/2012   Past Medical History:  Diagnosis Date   Chronic back pain    Chronic leg pain    Diabetes mellitus without complication (HCC)    Type II   Dizziness    Hydradenitis    Hyperlipidemia    Legally blind    Macular degeneration    Pseudotumor cerebri syndrome 2005   shunt placed- and legally blind   Sciatica     Family History  Problem Relation Age of Onset   Diabetes Mother    Hypertension Mother    Diabetes Father     Past Surgical History:  Procedure Laterality Date   CSF SHUNT     2 revisions   MULTIPLE EXTRACTIONS WITH ALVEOLOPLASTY Bilateral 11/02/2017   Procedure: MULTIPLE EXTRACTION;  Surgeon: Ocie Doyne, DDS;  Location: [facility] OR;  Service: Oral Surgery;  Laterality: Bilateral;   Social History   Occupational History   Not on file  Tobacco Use   Smoking status: Current Every Day Smoker    Packs/day: 0.50    Types: Cigarettes   Smokeless tobacco: Never Used  Vaping Use   Vaping Use: Never used  Substance and Sexual Activity   Alcohol use: No   Drug use: No    Comment: marijuana -- bag per day.    Sexual activity: Yes    Birth control/protection: None    Comment: 1 partners

## 2020-05-17 ENCOUNTER — Telehealth: Payer: Self-pay | Admitting: *Deleted

## 2020-05-17 NOTE — Telephone Encounter (Signed)
   Primary Cardiologist: Nicki Guadalajara, MD  Chart reviewed as part of pre-operative protocol coverage. The patient was seen by [name] 03/31/20. Noted that her dizziness improved and felt mostly orthostatic in nature. Follow up monitor result without significant arrhthymias. No afib noted.   Given past medical history and time since last visit, based on ACC/AHA guidelines, Monica Huang would be at acceptable risk for the planned procedure without further cardiovascular testing.   I will route this recommendation to the requesting party via Epic fax function and remove from pre-op pool.  Please call with questions.  [place], [place] 05/17/2020, 4:33 PM

## 2020-05-17 NOTE — Telephone Encounter (Signed)
   [facility] Pre-operative Risk Assessment    HEARTCARE STAFF: - Please ensure there is not already an duplicate clearance open for this procedure. - Under Visit Info/Reason for Call, type in Other and utilize the format Clearance MM/DD/YY or Clearance TBD. Do not use dashes or single digits. - If request is for dental extraction, please clarify the # of teeth to be extracted.  Request for surgical clearance:  1. What type of surgery is being performed? CERVICAL FUSION C5-6   2. When is this surgery scheduled? 05/25/20   3. What type of clearance is required (medical clearance vs. Pharmacy clearance to hold med vs. Both)? MEDICAL  4. Are there any medications that need to be held prior to surgery and how long? NONE LISTED   5. Practice name and name of physician performing surgery? [facility] [place]; DR. Jeneen Rinks NITKA   6. What is the office phone number? [phone]   7.   What is the office fax number? [place].   Anesthesia type (None, local, MAC, general) ? GENERAL   Julaine Hua 05/17/2020, 4:19 PM  _________________________________________________________________   (provider comments below)

## 2020-05-18 NOTE — Progress Notes (Deleted)
Subjective:  No chief complaint on file.   Monica Huang  is here for a Pre-operative physical at the request of Dr. Marland Kitchen   She  is having *** surgery on *** for ***.  Personal or family hx of adverse outcome to anesthesia? {YES/NO:21197} Chipped, cracked, missing, or loose teeth? {YES/NO:21197} Decreased ROM of neck? {YES/NO:21197} Able to walk up 2 flights of stairs without becoming significantly short of breath or having chest pain? {YES/NO:21197}  Revised Goldman Criteria: High Risk Surgery (intraperitoneal, intrathoracic, aortic): {YES/NO:21197} Ischemic heart disease (Prior MI, +excercise stress test, angina, nitrate use, Qwave): {YES/NO:21197} History of heart failure: {YES/NO:21197} History of cerebrovascular disease: {YES/NO:21197} History of diabetes: {YES/NO:21197} Insulin therapy for DM: {YES/NO:21197} Preoperative Cr >2.0: {YES/NO:21197}  Revised Goldman Criteria - risk for major cardiac death No risk factors -- 0.4 percent One risk factor -- 1.0 percent  Two risk factors -- 2.4 percent  Three or more risk factors -- 5.4 percent   Patient Active Problem List   Diagnosis Date Noted  . Suspected sleep apnea 03/11/2020  . Palpitations 12/23/2019  . Chronic pain of both knees 10/23/2019  . Neck and shoulder pain 07/04/2019  . Close exposure to COVID-19 virus 06/25/2019  . Left knee pain 06/10/2019  . Viral upper respiratory infection 05/22/2019  . Cervical radiculitis 03/10/2019  . Prediabetes 02/06/2019  . Carpal tunnel syndrome 02/03/2019  . Vaginal discharge 07/05/2016  . Anxiety and depression 06/10/2016  . Other optic atrophy, 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  . Essential hypertension, benign 05/10/2015  . Dizziness 09/30/2014  . Mechanical complication-ventricular(CSF) communicating shunt (HCC) 03/05/2013  . Hidradenitis suppurativa 10/15/2012  . Right hand pain 02/13/2012  . Tobacco abuse 01/03/2012  . Legally blind 01/03/2012  . Obesity 01/03/2012  . Marijuana smoker  01/03/2012   Past Medical History:  Diagnosis Date  . Chronic back pain   . Chronic leg pain   . Diabetes mellitus without complication (HCC)    Type II  . Dizziness   . Hydradenitis   . Hyperlipidemia   . Legally blind   . Macular degeneration   . Pseudotumor cerebri syndrome 2005   shunt placed- and legally blind  . Sciatica     Past Surgical History:  Procedure Laterality Date  . CSF SHUNT     2 revisions  . MULTIPLE EXTRACTIONS WITH ALVEOLOPLASTY Bilateral 11/02/2017   Procedure: MULTIPLE EXTRACTION;  Surgeon: Ocie Doyne, DDS;  Location: [facility] OR;  Service: Oral Surgery;  Laterality: Bilateral;    Current Outpatient Medications  Medication Sig Dispense Refill  . clindamycin (CLINDAGEL) 1 % gel Apply topically 2 (two) times daily. (Patient taking differently: Apply 1 application topically 2 (two) times daily as needed (rash/boils). ) 30 g 0  . gabapentin (NEURONTIN) 300 MG capsule Take 1 capsule (300 mg total) by mouth at bedtime. 30 capsule 1  . HYDROcodone-acetaminophen (NORCO/VICODIN) 5-325 MG tablet Take 1 tablet by mouth every 6 (six) hours as needed for moderate pain. (Patient taking differently: Take 1 tablet by mouth at bedtime. ) 30 tablet 0  . methylPREDNISolone (MEDROL DOSEPAK) 4 MG TBPK tablet Take as directed, 6 day dose pak. (Patient taking differently: Take 4-24 mg by mouth See admin instructions. (Typical regimens for 21 tablet dose packs of Methylprednisolone 4mg , Prednisone 5mg , and Prednisone 10mg ) Day 1: 2 tabs before breakfast, 1 tab after lunch, 1 tab after supper, and 2 tabs at bedtime. Day 2: 1 tab before breakfast, 1 tab after lunch, 1 tab after supper, and 2 tabs at bedtime. Day 3:  1 tab before breakfast, 1 tab after lunch, 1 tab after supper, and 1 tab at bedtime. Day 4: 1 tab before breakfast, 1 tab after lunch, and 1 tab at bedtime. Day 5: 1 tab before breakfast and 1 tab at bedtime. Day 6: 1 tab before breakfast.) 21 tablet 0  . tetracycline  (SUMYCIN) 500 MG capsule Take 500 mg by mouth daily.     No current facility-administered medications for this visit.    No Known Allergies  Social History   Socioeconomic History  . Marital status: Single    Spouse name: Not on file  . Number of children: Not on file  . Years of education: Not on file  . Highest education level: Not on file  Occupational History  . Not on file  Tobacco Use  . Smoking status: Current Every Day Smoker    Packs/day: 0.50    Types: Cigarettes  . Smokeless tobacco: Never Used  Vaping Use  . Vaping Use: Never used  Substance and Sexual Activity  . Alcohol use: No  . Drug use: No    Comment: marijuana -- bag per day.   Marland Kitchen Sexual activity: Yes    Birth control/protection: None    Comment: 1 partners  Other Topics Concern  . Not on file  Social History Narrative   On disability since 2005 due to vision loss from psuedotumor cerebri.   Did not finish highschool-- completed the 9th grade.    Walking 2 x week for 58min.          Social Determinants of Health   Financial Resource Strain:   . Difficulty of Paying Living Expenses:   Food Insecurity:   . Worried About Charity fundraiser in the Last Year:   . Arboriculturist in the Last Year:   Transportation Needs:   . Film/video editor (Medical):   Marland Kitchen Lack of Transportation (Non-Medical):   Physical Activity:   . Days of Exercise per Week:   . Minutes of Exercise per Session:   Stress:   . Feeling of Stress :   Social Connections:   . Frequency of Communication with Friends and Family:   . Frequency of Social Gatherings with Friends and Family:   . Attends Religious Services:   . Active Member of Clubs or Organizations:   . Attends Archivist Meetings:   Marland Kitchen Marital Status:   Intimate Partner Violence:   . Fear of Current or Ex-Partner:   . Emotionally Abused:   Marland Kitchen Physically Abused:   . Sexually Abused:     Family History  Problem Relation Age of Onset  . Diabetes  Mother   . Hypertension Mother   . Diabetes Father      Review of Systems:  Constitutional:  no unexpected change in weight, no weakness, no unexplained fevers, sweats, or chills Eye:  no recent significant change in vision Ear:  no hearing loss Nose/Mouth/Throat:  No dental complaints Neck/Thyroid:  no lumps or masses Pulmonary:  no chronic cough, sputum, or hemoptysis and no shortness of breath Cardiovascular:  no exercise intolerance, no chest pain Gastrointestinal:  no abdominal pain and no change in bowel habits GU:  negative for dysuria, frequency, and incontinence Musculoskeletal/Extremities:  no peripheral edema Skin/Integumentary ROS:  no abnormal skin lesions reported Neurologic:  no numbness, tingling, or tremor   Objective:   There were no vitals filed for this visit. There is no height or weight on file to calculate BMI.  General:  well developed, well nourished, in no apparent distress Skin:  warm, no pallor or diaphoresis Head:  normocephalic, atraumatic Eyes:  pupils equal and round, sclera anicteric without injection Ears:  canals without lesions, TMs shiny without retraction, no obvious effusion, no erythema Throat/Pharynx:  lips and gingiva without lesion; tongue and uvula midline; non-inflamed pharynx; no exudates or postnasal drainage Neck: neck supple without adenopathy, thyromegaly, or masses, no bruits, no jugular venous distention Lungs:  clear to auscultation, breath sounds equal bilaterally, no respiratory distress Cardio:  regular rate and rhythm without murmurs Abdomen:  abdomen soft, nontender; bowel sounds normal; no masses, hepatomegaly or splenomegaly Musculoskeletal:  symmetrical muscle groups noted without atrophy or deformity Extremities:  no clubbing, cyanosis, or edema, no deformities, no skin discoloration Neuro:  gait normal; deep tendon reflexes normal and symmetric and alert and oriented to person, place, and time Psych: Age appropriate  judgment and insight; normal mood   Assessment:   No diagnosis found.   Plan:   Orders as above. EKG - {ekg findings:315101::"normal EKG, normal sinus rhythm","unchanged from previous tracings"} The above laboratory work was ordered and will be sent with this physical. Pending the above workup, the patient is deemed low cardiac risk for the proposed procedure.  The patient voiced understanding and agreement to the plan.  Oralia Manis, DO 05/18/20  11:57 AM

## 2020-05-18 NOTE — Progress Notes (Addendum)
[id number] [place], [place] - [address] AT [facility] [address] [place] [address] Phone: [phone] Fax: [phone]      Your procedure is scheduled on May 25, 2020.  Report to [facility] Main Entrance "A" at 10:35 A.M., and check in at the Admitting office.  Call this number if you have problems the morning of surgery:  [phone]  Call [phone] if you have any questions prior to your surgery date Monday-Friday 8am-4pm    Remember:  Do not eat or drink after midnight the night before your surgery    Take these medicines the morning of surgery with A SIP OF WATER: tetracycline (SUMYCIN)  clindamycin (CLINDAGEL) - as needed   HOW TO MANAGE YOUR DIABETES BEFORE AND AFTER SURGERY  Why is it important to control my blood sugar before and after surgery?  Improving blood sugar levels before and after surgery helps healing and can limit problems.  A way of improving blood sugar control is eating a healthy diet by: o  Eating less sugar and carbohydrates o  Increasing activity/exercise o  Talking with your doctor about reaching your blood sugar goals  High blood sugars (greater than 180 mg/dL) can raise your risk of infections and slow your recovery, so you will need to focus on controlling your diabetes during the weeks before surgery.  Make sure that the doctor who takes care of your diabetes knows about your planned surgery including the date and location.  How do I manage my blood sugar before surgery?  Check your blood sugar at least 4 times a day, starting 2 days before surgery, to make sure that the level is not too high or low.  Check your blood sugar the morning of your surgery when you wake up and every 2 hours until you get to the Short Stay unit. o If your blood sugar is less than 70 mg/dL, you will need to treat for low blood sugar: - Do not take insulin. - Treat a low blood sugar (less than 70  mg/dL) with  cup of clear juice (cranberry or apple), 4 glucose tablets, OR glucose gel. - Recheck blood sugar in 15 minutes after treatment (to make sure it is greater than 70 mg/dL). If your blood sugar is not greater than 70 mg/dL on recheck, call [phone] for further instructions.  Report your blood sugar to the short stay nurse when you get to Short Stay.   If you are admitted to the hospital after surgery: o Your blood sugar will be checked by the staff and you will probably be given insulin after surgery (instead of oral diabetes medicines) to make sure you have good blood sugar levels. o The goal for blood sugar control after surgery is 80-180 mg/dL.  As of today, STOP taking any Aspirin (unless otherwise instructed by your surgeon) and Aspirin containing products, Aleve, Naproxen, Ibuprofen, Motrin, Advil, Goody's, BC's, all herbal medications, fish oil, and all vitamins.                      Do not wear jewelry, make up, or nail polish            Do not wear lotions, powders, perfumes or deodorant.            Do not shave 48 hours prior to surgery.              Do not bring valuables to the  hospital.            [facility] is not responsible for any belongings or valuables.  Do NOT Smoke (Tobacco/Vapping) or drink Alcohol 24 hours prior to your procedure If you use a CPAP at night, you may bring all equipment for your overnight stay.   Contacts, glasses, dentures or bridgework may not be worn into surgery.      For patients admitted to the hospital, discharge time will be determined by your treatment team.   Patients discharged the day of surgery will not be allowed to drive home, and someone needs to stay with them for 24 hours.    Special instructions:   [facility]- Preparing For Surgery  Before surgery, you can play an important role. Because skin is not sterile, your skin needs to be as free of germs as possible. You can reduce the number of germs on your skin by  washing with CHG (chlorahexidine gluconate) Soap before surgery.  CHG is an antiseptic cleaner which kills germs and bonds with the skin to continue killing germs even after washing.    Oral Hygiene is also important to reduce your risk of infection.  Remember - BRUSH YOUR TEETH THE MORNING OF SURGERY WITH YOUR REGULAR TOOTHPASTE  Please do not use if you have an allergy to CHG or antibacterial soaps. If your skin becomes reddened/irritated stop using the CHG.  Do not shave (including legs and underarms) for at least 48 hours prior to first CHG shower. It is OK to shave your face.  Please follow these instructions carefully.   1. Shower the NIGHT BEFORE SURGERY and the MORNING OF SURGERY with CHG Soap.   2. If you chose to wash your hair, wash your hair first as usual with your normal shampoo.  3. After you shampoo, rinse your hair and body thoroughly to remove the shampoo.  4. Use CHG as you would any other liquid soap. You can apply CHG directly to the skin and wash gently with a scrungie or a clean washcloth.   5. Apply the CHG Soap to your body ONLY FROM THE NECK DOWN.  Do not use on open wounds or open sores. Avoid contact with your eyes, ears, mouth and genitals (private parts). Wash Face and genitals (private parts)  with your normal soap.   6. Wash thoroughly, paying special attention to the area where your surgery will be performed.  7. Thoroughly rinse your body with warm water from the neck down.  8. DO NOT shower/wash with your normal soap after using and rinsing off the CHG Soap.  9. Pat yourself dry with a CLEAN TOWEL.  10. Wear CLEAN PAJAMAS to bed the night before surgery, wear comfortable clothes the morning of surgery  11. Place CLEAN SHEETS on your bed the night of your first shower and DO NOT SLEEP WITH PETS.   Day of Surgery:   Do not apply any deodorants/lotions.  Please wear clean clothes to the hospital/surgery center.   Remember to brush your teeth WITH  YOUR REGULAR TOOTHPASTE.   Please read over the following fact sheets that you were given.

## 2020-05-19 ENCOUNTER — Other Ambulatory Visit: Payer: Self-pay

## 2020-05-19 ENCOUNTER — Encounter (HOSPITAL_COMMUNITY): Payer: Self-pay

## 2020-05-19 ENCOUNTER — Encounter (HOSPITAL_COMMUNITY)
Admission: RE | Admit: 2020-05-19 | Discharge: 2020-05-19 | Disposition: A | Discharge status: Home / Self Care | Payer: Medicare Other | Source: Ambulatory Visit | Attending: Specialist | Admitting: Specialist

## 2020-05-19 ENCOUNTER — Ambulatory Visit: Payer: Medicare Other | Admitting: Family Medicine

## 2020-05-19 DIAGNOSIS — Z01818 Encounter for other preprocedural examination: Secondary | ICD-10-CM | POA: Insufficient documentation

## 2020-05-19 HISTORY — DX: Presence of dental prosthetic device (complete) (partial): Z97.2

## 2020-05-19 HISTORY — DX: Pneumonia, unspecified organism: J18.9

## 2020-05-19 HISTORY — DX: Dependence on other enabling machines and devices: Z99.89

## 2020-05-19 HISTORY — DX: Sleep apnea, unspecified: G47.30

## 2020-05-19 LAB — SURGICAL PCR SCREEN
MRSA, PCR: NEGATIVE
Staphylococcus aureus: NEGATIVE

## 2020-05-19 LAB — TYPE AND SCREEN
ABO/RH(D): AB POS
Antibody Screen: NEGATIVE

## 2020-05-19 LAB — APTT: aPTT: 29 seconds (ref 24–36)

## 2020-05-19 LAB — ABO/RH: ABO/RH(D): AB POS

## 2020-05-19 LAB — COMPREHENSIVE METABOLIC PANEL
ALT: 19 U/L (ref 0–44)
AST: 16 U/L (ref 15–41)
Albumin: 3.5 g/dL (ref 3.5–5.0)
Alkaline Phosphatase: 57 U/L (ref 38–126)
Anion gap: 11 (ref 5–15)
BUN: 11 mg/dL (ref 6–20)
CO2: 23 mmol/L (ref 22–32)
Calcium: 9 mg/dL (ref 8.9–10.3)
Chloride: 103 mmol/L (ref 98–111)
Creatinine, Ser: 1.08 mg/dL — ABNORMAL HIGH (ref 0.44–1.00)
GFR calc Af Amer: >60 mL/min (ref >60)
GFR calc non Af Amer: >60 mL/min (ref >60)
Glucose, Bld: 131 mg/dL — ABNORMAL HIGH (ref 70–99)
Potassium: 3.8 mmol/L (ref 3.5–5.1)
Sodium: 137 mmol/L (ref 135–145)
Total Bilirubin: 0.5 mg/dL (ref 0.3–1.2)
Total Protein: 7.5 g/dL (ref 6.5–8.1)

## 2020-05-19 LAB — CBC
HCT: 46.3 % — ABNORMAL HIGH (ref 36.0–46.0)
Hemoglobin: 15 g/dL (ref 12.0–15.0)
MCH: 32.8 pg (ref 26.0–34.0)
MCHC: 32.4 g/dL (ref 30.0–36.0)
MCV: 101.3 fL — ABNORMAL HIGH (ref 80.0–100.0)
Platelets: 335 10*3/uL (ref 150–400)
RBC: 4.57 MIL/uL (ref 3.87–5.11)
RDW: 13.9 % (ref 11.5–15.5)
WBC: 9 10*3/uL (ref 4.0–10.5)
nRBC: 0 % (ref 0.0–0.2)

## 2020-05-19 LAB — URINALYSIS, ROUTINE W REFLEX MICROSCOPIC
Bilirubin Urine: NEGATIVE
Glucose, UA: NEGATIVE mg/dL
Ketones, ur: 5 mg/dL — AB
Leukocytes,Ua: NEGATIVE
Nitrite: NEGATIVE
Protein, ur: NEGATIVE mg/dL
Specific Gravity, Urine: 1.029 (ref 1.005–1.030)
pH: 5 (ref 5.0–8.0)

## 2020-05-19 LAB — HEMOGLOBIN A1C
Hgb A1c MFr Bld: 6.6 % — ABNORMAL HIGH (ref 4.8–5.6)
Mean Plasma Glucose: 142.72 mg/dL

## 2020-05-19 LAB — GLUCOSE, CAPILLARY: Glucose-Capillary: 142 mg/dL — ABNORMAL HIGH (ref 70–99)

## 2020-05-19 LAB — PROTIME-INR
INR: 1 (ref 0.8–1.2)
Prothrombin Time: 12.6 seconds (ref 11.4–15.2)

## 2020-05-19 NOTE — Progress Notes (Signed)
Patient is with Boyfriend Delsa Sale cell [phone] during PAT visit. Patient is legally blind.  Patient denies shortness of breath, fever, cough or chest pain.  PCP - Lavonda Jumbo, DO Cardiologist - Dr Nicki Guadalajara  Chest x-ray - 12/22/19 (1V) EKG - 05/19/20 Stress Test - n/a ECHO - 09/17/17 Cardiac Cath - n/s  Sleep Study - Yes CPAP - "Mild" - has appt on 06/23/20 to see about cpap machine.  Fasting Blood Sugar - Unknown Checks Blood Sugar _0_ times a day  ERAS: Clears til 9:30 am DOS, G2 Drink given at PAT appt.  Anesthesia review: Yes  STOP now taking any Aspirin (unless otherwise instructed by your surgeon), Aleve, Naproxen, Ibuprofen, Motrin, Advil, Goody's, BC's, all herbal medications, fish oil, and all vitamins.   Coronavirus Screening Covid test scheduled 05/22/20. Do you have any of the following symptoms:  Cough yes/no: No Fever (>100.42F)  yes/no: No Runny nose yes/no: No Sore throat yes/no: No Difficulty breathing/shortness of breath  yes/no: No  Have you traveled in the last 14 days and where? yes/no: No  Patient verbalized understanding of instructions that were given to them at the PAT appointment.

## 2020-05-19 NOTE — Progress Notes (Signed)
[id number] [place] [facility], [place] - [address] AT [facility] [address] [place] [address] Phone: [phone] Fax: [phone]      Your procedure is scheduled on May 25, 2020.  Report to [facility] Main Entrance "A" at 10:35 A.M., and check in at the Admitting office.  Call this number if you have problems the morning of surgery:  [phone]  Call [phone] if you have any questions prior to your surgery date Monday-Friday 8am-4pm    Remember:  Do not eat after midnight the night before your surgery   You may drink clear liquids until 9:30 am the morning of your surgery.   Clear liquids allowed are: Water, Non-Citrus Juices (without pulp), Carbonated Beverages, Clear Tea, Black Coffee Only, and Gatorade  Please complete your PRE-SURGERY ENSURE that was provided to you by 9:30 am the morning of surgery.  Please, if able, drink it in one setting. DO NOT SIP.   Take these medicines the morning of surgery with A SIP OF WATER: None  HOW TO MANAGE YOUR DIABETES BEFORE AND AFTER SURGERY  Why is it important to control my blood sugar before and after surgery? . Improving blood sugar levels before and after surgery helps healing and can limit problems. . A way of improving blood sugar control is eating a healthy diet by: o  Eating less sugar and carbohydrates o  Increasing activity/exercise o  Talking with your doctor about reaching your blood sugar goals . High blood sugars (greater than 180 mg/dL) can raise your risk of infections and slow your recovery, so you will need to focus on controlling your diabetes during the weeks before surgery. . Make sure that the doctor who takes care of your diabetes knows about your planned surgery including the date and location.  How do I manage my blood sugar before surgery? . Check your blood sugar at least 4 times a day, starting 2 days before surgery, to make sure that the level is not  too high or low. . Check your blood sugar the morning of your surgery when you wake up and every 2 hours until you get to the Short Stay unit. o If your blood sugar is less than 70 mg/dL, you will need to treat for low blood sugar: - Treat a low blood sugar (less than 70 mg/dL) with  cup of clear juice (cranberry or apple), 4 glucose tablets, OR glucose gel. - Recheck blood sugar in 15 minutes after treatment (to make sure it is greater than 70 mg/dL). If your blood sugar is not greater than 70 mg/dL on recheck, call [phone] for further instructions. -  . Report your blood sugar to the short stay nurse when you get to Short Stay.  . If you are admitted to the hospital after surgery: o Your blood sugar will be checked by the staff and you will probably be given insulin after surgery (instead of oral diabetes medicines) to make sure you have good blood sugar levels. o The goal for blood sugar control after surgery is 80-180 mg/dL.  As of today, STOP taking any Aspirin (unless otherwise instructed by your surgeon) and Aspirin containing products, Aleve, Naproxen, Ibuprofen, Motrin, Advil, Goody's, BC's, all herbal medications, fish oil, and all vitamins.                      Do not wear jewelry, make up, or nail polish  Do not wear lotions, powders, perfumes or deodorant.            Do not shave 48 hours prior to surgery.              Do not bring valuables to the hospital.            [facility] is not responsible for any belongings or valuables.  Do NOT Smoke (Tobacco/Vapping) or drink Alcohol 24 hours prior to your procedure If you use a CPAP at night, you may bring all equipment for your overnight stay.   Contacts, glasses, dentures or bridgework may not be worn into surgery.      For patients admitted to the hospital, discharge time will be determined by your treatment team.   Patients discharged the day of surgery will not be allowed to drive home, and someone needs to  stay with them for 24 hours.    Special instructions:   [facility]- Preparing For Surgery  Before surgery, you can play an important role. Because skin is not sterile, your skin needs to be as free of germs as possible. You can reduce the number of germs on your skin by washing with CHG (chlorahexidine gluconate) Soap before surgery.  CHG is an antiseptic cleaner which kills germs and bonds with the skin to continue killing germs even after washing.    Oral Hygiene is also important to reduce your risk of infection.  Remember - BRUSH YOUR TEETH THE MORNING OF SURGERY WITH YOUR REGULAR TOOTHPASTE  Please do not use if you have an allergy to CHG or antibacterial soaps. If your skin becomes reddened/irritated stop using the CHG.  Do not shave (including legs and underarms) for at least 48 hours prior to first CHG shower. It is OK to shave your face.  Please follow these instructions carefully.   1. Shower the NIGHT BEFORE SURGERY Mon and the MORNING OF SURGERY Tues with CHG Soap.   2. If you chose to wash your hair, wash your hair first as usual with your normal shampoo.  3. After you shampoo, rinse your hair and body thoroughly to remove the shampoo.  4. Use CHG as you would any other liquid soap. You can apply CHG directly to the skin and wash gently with a scrungie or a clean washcloth.   5. Apply the CHG Soap to your body ONLY FROM THE NECK DOWN.  Do not use on open wounds or open sores. Avoid contact with your eyes, ears, mouth and genitals (private parts). Wash Face and genitals (private parts)  with your normal soap.   6. Wash thoroughly, paying special attention to the area where your surgery will be performed.  7. Thoroughly rinse your body with warm water from the neck down.  8. DO NOT shower/wash with your normal soap after using and rinsing off the CHG Soap.  9. Pat yourself dry with a CLEAN TOWEL.  10. Wear CLEAN PAJAMAS to bed the night before surgery, wear comfortable  clothes the morning of surgery  11. Place CLEAN SHEETS on your bed the night of your first shower and DO NOT SLEEP WITH PETS.   Day of Surgery:   Do not apply any deodorants/lotions.  Please wear clean clothes to the hospital/surgery center.   Remember to brush your teeth WITH YOUR REGULAR TOOTHPASTE.   Please read over the following fact sheets that you were given.

## 2020-05-19 NOTE — Progress Notes (Signed)
IB message sent to MD to look at abnormal Urine results in Epic.

## 2020-05-20 ENCOUNTER — Other Ambulatory Visit: Payer: Self-pay

## 2020-05-20 ENCOUNTER — Ambulatory Visit: Payer: Medicare Other | Admitting: Specialist

## 2020-05-20 NOTE — Progress Notes (Signed)
Anesthesia Chart Review:   Case: [id number] Date/Time: 05/25/20 1235   Procedure: ANTERIOR CERVICAL DISCECTOMY AND FUSION C5-6 WITH PLATES, SCREWS, ALLOGRAFT AND LOCAL BONE GRAFT, VIVIGEN (N/A )   Anesthesia type: General   Pre-op diagnosis: C5-6 herniated nucleus pulposus with myelopathy   Location: [facility] OR ROOM 05 / [facility]   Surgeons: Jessy Oto, MD      DISCUSSION:  Pt is a 39 year old with hx prediabetes, pseudotumor cerebri, legally blind, hidradenitis suppurativa  Pt has cardiac clearance for surgery at acceptable risk by Leanor Kail, PA on 05/17/20  HbA1c 6.6 at pre-admission testing. Will route to PCP for f/u diabetes.    VS: BP 120/72   Pulse 99   Temp 36.9 C (Oral)   Resp 18   Ht 5\' 8"  (1.727 m)   Wt (!) 137.5 kg   LMP 05/04/2020 (Approximate)   SpO2 96%   BMI 46.10 kg/m    PROVIDERS: - PCP is [name], DO - Cardiologist is [name], MD. Last office visit 03/31/20 with Almyra Deforest, PA who evaluated pt for dizziness with cardiac event monitor (results below); thought to be orthostasis   LABS: Labs reviewed: Acceptable for surgery. (all labs ordered are listed, but only abnormal results are displayed)  Labs Reviewed  GLUCOSE, CAPILLARY - Abnormal; Notable for the following components:      Result Value   Glucose-Capillary 142 (*)    All other components within normal limits  CBC - Abnormal; Notable for the following components:   HCT 46.3 (*)    MCV 101.3 (*)    All other components within normal limits  COMPREHENSIVE METABOLIC PANEL - Abnormal; Notable for the following components:   Glucose, Bld 131 (*)    Creatinine, Ser 1.08 (*)    All other components within normal limits  URINALYSIS, ROUTINE W REFLEX MICROSCOPIC - Abnormal; Notable for the following components:   Color, Urine AMBER (*)    APPearance HAZY (*)    Hgb urine dipstick SMALL (*)    Ketones, ur 5 (*)    Bacteria, UA RARE (*)    All other components within normal limits   HEMOGLOBIN A1C - Abnormal; Notable for the following components:   Hgb A1c MFr Bld 6.6 (*)    All other components within normal limits  SURGICAL PCR SCREEN  APTT  PROTIME-INR  TYPE AND SCREEN  ABO/RH     IMAGES:  1 view CXR 12/22/19: No acute findings.   EKG 05/19/20: Sinus rhythm with 1st degree A-V block. Possible Left atrial enlargement. Left axis deviation. Minimal voltage criteria for LVH, may be normal variant. Septal infarct, age undetermined   CV:  Cardiac event monitor 04/09/20:  - Patient was monitored with a Zio patch for 6 days and 20 hours from March 08, 2020 through March 15, 2020.   - The average heart rate was sinus rhythm at 99 bpm with a minimum sinus rhythm being at 46 bpm and maximum at 144 bpm.   - The patient had a minimum heart rate at 25 bpm which occurred at 6:34 AM on 416 and a maximal heart rate at 164 bpm.  The predominant rhythm was sinus rhythm.   - There was an episode of 9 beats of ventricular tachycardia with an average rate of 153 bpm.  There were several episodes of second-degree Mobitz type I AV block 1 episode the rate ranged from 37-60 and another episode the rate ranged from 26 to 78 bpm.  There were rare isolated PACs.  There were no couplets or triplets.  There were no isolated PVCs couplets or triplets.  There was no evidence for atrial fibrillation.   Echo 09/17/17:  - Left ventricle: The cavity size was normal. Systolic function was normal. The estimated ejection fraction was in the range of 60% to 65%. Wall motion was normal; there were no regional wall motion abnormalities. Left ventricular diastolic function parameters were normal.  - Aortic valve: Trileaflet; normal thickness leaflets. There was no regurgitation.  - Aortic root: The aortic root was normal in size.  - Mitral valve: There was mild regurgitation.  - Right ventricle: The cavity size was normal. Wall thickness was normal. Systolic function was normal.  - Tricuspid valve:  There was mild regurgitation.  - Pulmonic valve: There was no regurgitation.  - Pulmonary arteries: Systolic pressure was within the normal range.  - Inferior vena cava: The vessel was normal in size.  - Pericardium, extracardiac: There was no pericardial effusion.  - Impressions: Mild mitral and tricuspid regurgitation, otherwise normal study.    Past Medical History:  Diagnosis Date  . Ambulates with cane   . Chronic back pain   . Chronic leg pain   . Diabetes mellitus without complication (HCC)    Type II - no meds  . Dizziness   . Hydradenitis   . Hyperlipidemia    diet controlle - no meds  . Legally blind    uses cane - some limited vision  . Macular degeneration    patient denies this dx  (dr street dx)  . Pneumonia    x 1  . Pseudotumor cerebri syndrome 2005   shunt placed- and legally blind  . Sciatica   . Sleep apnea    "Mild" - has appt on 06/23/20 to see about cpap machine  . Wears partial dentures    upper and lower    Past Surgical History:  Procedure Laterality Date  . CSF SHUNT     2 revisions  . MULTIPLE EXTRACTIONS WITH ALVEOLOPLASTY Bilateral 11/02/2017   Procedure: MULTIPLE EXTRACTION;  Surgeon: Ocie Doyne, DDS;  Location: [facility] OR;  Service: Oral Surgery;  Laterality: Bilateral;    MEDICATIONS: . clindamycin (CLINDAGEL) 1 % gel  . gabapentin (NEURONTIN) 300 MG capsule  . HYDROcodone-acetaminophen (NORCO/VICODIN) 5-325 MG tablet  . methylPREDNISolone (MEDROL DOSEPAK) 4 MG TBPK tablet  . tetracycline (SUMYCIN) 500 MG capsule   No current facility-administered medications for this encounter.    If no changes, I anticipate pt can proceed with surgery as scheduled.   Rica Mast, FNP-BC [facility]/Anesthesiology Phone: [phone] 05/20/2020 10:32 AM

## 2020-05-20 NOTE — Anesthesia Preprocedure Evaluation (Addendum)
Anesthesia Evaluation  Patient identified by MRN, date of birth, ID band Patient awake    Reviewed: Allergy & Precautions, NPO status , Patient's Chart, lab work & pertinent test results  Airway Mallampati: III  TM Distance: >3 FB Neck ROM: Full  Mouth opening: Limited Mouth Opening  Dental no notable dental hx. (+) Poor Dentition, Missing,    Pulmonary sleep apnea and Continuous Positive Airway Pressure Ventilation , Current SmokerPatient did not abstain from smoking.,    Pulmonary exam normal breath sounds clear to auscultation       Cardiovascular hypertension, Pt. on medications Normal cardiovascular exam Rhythm:Regular Rate:Normal  EKG 05/19/20: Sinus rhythm with 1st degree A-V block. Possible Left atrial enlargement. Left axis deviation. Minimal voltage criteria for LVH, may be normal variant. Septal infarct, age undetermined  CV: Cardiac event monitor 04/09/20:  - Patient was monitored with a Zio patch for 6 days and 20 hours from March 08, 2020 through March 15, 2020.  - The average heart rate was sinus rhythm at 99 bpm with a minimum sinus rhythm being at 46 bpm and maximum at 144 bpm.  - The patient had a minimum heart rate at 25 bpm which occurred at 6:34 AM on 416 and a maximal heart rate at 164 bpm. The predominant rhythm was sinus rhythm.  - There was an episode of 9 beats of ventricular tachycardia with an average rate of 153 bpm. There were several episodes of second-degree Mobitz type I AV block 1 episode the rate ranged from 37-60 and another episode the rate ranged from 26 to 78 bpm. There were rare isolated PACs. There were no couplets or triplets. There were no isolated PVCs couplets or triplets. There was no evidence for atrial fibrillation.  Echo 09/17/17:  - Left ventricle: The cavity size was normal. Systolic function was normal. The estimated ejection fraction was in the range of 60% to 65%. Wall motion  was normal; there were no regional wall motion abnormalities. Left ventricular diastolic function parameters were normal.  - Aortic valve: Trileaflet; normal thickness leaflets. There was no regurgitation.  - Aortic root: The aortic root was normal in size.  - Mitral valve: There was mild regurgitation.  - Right ventricle: The cavity size was normal. Wall thickness was normal. Systolic function was normal.  - Tricuspid valve: There was mild regurgitation.  - Pulmonic valve: There was no regurgitation.  - Pulmonary arteries: Systolic pressure was within the normal range.  - Inferior vena cava: The vessel was normal in size.  - Pericardium, extracardiac: There was no pericardial effusion.  - Impressions: Mild mitral and tricuspid regurgitation, otherwise normal study.     Neuro/Psych PSYCHIATRIC DISORDERS Anxiety Depression Pseudotumor cerebri s/p shunt placement 2005 negative neurological ROS     GI/Hepatic negative GI ROS, Neg liver ROS,   Endo/Other  diabetes, Well Controlled, Type obesity (BMI 46)  Renal/GU negative Renal ROS  negative genitourinary   Musculoskeletal  (+) Arthritis ,   Abdominal   Peds  Hematology negative hematology ROS (+)   Anesthesia Other Findings   Reproductive/Obstetrics                           Anesthesia Physical Anesthesia Plan  ASA: III  Anesthesia Plan: General   Post-op Pain Management:    Induction: Intravenous  PONV Risk Score and Plan: Midazolam, Dexamethasone and Ondansetron  Airway Management Planned: Oral ETT and Video Laryngoscope Planned  Additional Equipment:  Intra-op Plan:   Post-operative Plan: Extubation in OR  Informed Consent: I have reviewed the patients History and Physical, chart, labs and discussed the procedure including the risks, benefits and alternatives for the proposed anesthesia with the patient or authorized representative who has indicated his/her understanding and  acceptance.     Dental advisory given  Plan Discussed with: CRNA  Anesthesia Plan Comments:        Anesthesia Quick Evaluation

## 2020-05-22 ENCOUNTER — Other Ambulatory Visit (HOSPITAL_COMMUNITY)
Admission: RE | Admit: 2020-05-22 | Discharge: 2020-05-22 | Disposition: A | Discharge status: Home / Self Care | Payer: Medicare Other | Source: Ambulatory Visit | Attending: Specialist | Admitting: Specialist

## 2020-05-22 DIAGNOSIS — Z01812 Encounter for preprocedural laboratory examination: Secondary | ICD-10-CM | POA: Insufficient documentation

## 2020-05-22 DIAGNOSIS — Z20822 Contact with and (suspected) exposure to covid-19: Secondary | ICD-10-CM | POA: Diagnosis not present

## 2020-05-22 LAB — SARS CORONAVIRUS 2 (TAT 6-24 HRS): SARS Coronavirus 2: NEGATIVE

## 2020-05-24 MED ORDER — DEXTROSE 5 % IV SOLN
3.0000 g | INTRAVENOUS | Status: AC
  Administered 2020-05-25: 3 g via INTRAVENOUS
  Filled 2020-05-24: qty 3

## 2020-05-24 MED ORDER — BUPIVACAINE LIPOSOME 1.3 % IJ SUSP
20.0000 mL | Freq: Once | INTRAMUSCULAR | Status: DC
  Filled 2020-05-24: qty 20

## 2020-05-25 ENCOUNTER — Inpatient Hospital Stay (HOSPITAL_COMMUNITY)
Admission: RE | Disposition: A | Discharge status: Home / Self Care | Payer: Self-pay | Source: Home / Self Care | Attending: Specialist

## 2020-05-25 ENCOUNTER — Ambulatory Visit (HOSPITAL_COMMUNITY): Payer: Medicare Other

## 2020-05-25 ENCOUNTER — Encounter (HOSPITAL_COMMUNITY): Payer: Self-pay | Admitting: Specialist

## 2020-05-25 ENCOUNTER — Inpatient Hospital Stay (HOSPITAL_COMMUNITY)
Admission: RE | Admit: 2020-05-25 | Discharge: 2020-05-27 | DRG: 472 | Disposition: A | Discharge status: Home / Self Care | Payer: Medicare Other | Attending: Specialist | Admitting: Specialist

## 2020-05-25 ENCOUNTER — Ambulatory Visit (HOSPITAL_COMMUNITY): Payer: Medicare Other | Admitting: Emergency Medicine

## 2020-05-25 ENCOUNTER — Ambulatory Visit (HOSPITAL_COMMUNITY): Payer: Medicare Other | Admitting: Certified Registered"

## 2020-05-25 ENCOUNTER — Other Ambulatory Visit: Payer: Self-pay

## 2020-05-25 DIAGNOSIS — H548 Legal blindness, as defined in USA: Secondary | ICD-10-CM | POA: Diagnosis present

## 2020-05-25 DIAGNOSIS — M50022 Cervical disc disorder at C5-C6 level with myelopathy: Secondary | ICD-10-CM | POA: Diagnosis present

## 2020-05-25 DIAGNOSIS — Z833 Family history of diabetes mellitus: Secondary | ICD-10-CM

## 2020-05-25 DIAGNOSIS — Z981 Arthrodesis status: Secondary | ICD-10-CM

## 2020-05-25 DIAGNOSIS — Z419 Encounter for procedure for purposes other than remedying health state, unspecified: Secondary | ICD-10-CM

## 2020-05-25 DIAGNOSIS — Z6841 Body Mass Index (BMI) 40.0 and over, adult: Secondary | ICD-10-CM

## 2020-05-25 DIAGNOSIS — M5010 Cervical disc disorder with radiculopathy, unspecified cervical region: Secondary | ICD-10-CM | POA: Diagnosis present

## 2020-05-25 DIAGNOSIS — Z8249 Family history of ischemic heart disease and other diseases of the circulatory system: Secondary | ICD-10-CM

## 2020-05-25 DIAGNOSIS — M50122 Cervical disc disorder at C5-C6 level with radiculopathy: Principal | ICD-10-CM | POA: Diagnosis present

## 2020-05-25 DIAGNOSIS — E119 Type 2 diabetes mellitus without complications: Secondary | ICD-10-CM | POA: Diagnosis present

## 2020-05-25 DIAGNOSIS — F17210 Nicotine dependence, cigarettes, uncomplicated: Secondary | ICD-10-CM | POA: Diagnosis present

## 2020-05-25 DIAGNOSIS — M47812 Spondylosis without myelopathy or radiculopathy, cervical region: Secondary | ICD-10-CM | POA: Diagnosis not present

## 2020-05-25 DIAGNOSIS — Z982 Presence of cerebrospinal fluid drainage device: Secondary | ICD-10-CM

## 2020-05-25 HISTORY — PX: ANTERIOR CERVICAL DECOMP/DISCECTOMY FUSION: SHX1161

## 2020-05-25 LAB — GLUCOSE, CAPILLARY
Glucose-Capillary: 125 mg/dL — ABNORMAL HIGH (ref 70–99)
Glucose-Capillary: 165 mg/dL — ABNORMAL HIGH (ref 70–99)

## 2020-05-25 LAB — POCT PREGNANCY, URINE: Preg Test, Ur: NEGATIVE

## 2020-05-25 IMAGING — RF DG CERVICAL SPINE 1V
1 series · 3 of 3 positions shown · non-contrast
Comparison: Cervical spine MRI [DATE]

CLINICAL DATA: Surgery, elective. Additional history provided: ACDF
C5-C6. Provided fluoroscopy time 17 seconds, 5.85 mGy

EXAM:
DG CERVICAL SPINE - 1 VIEW; DG C-ARM 1-60 MIN

[Series 1: run · 3 of 3 slices shown]
[im 1/3]
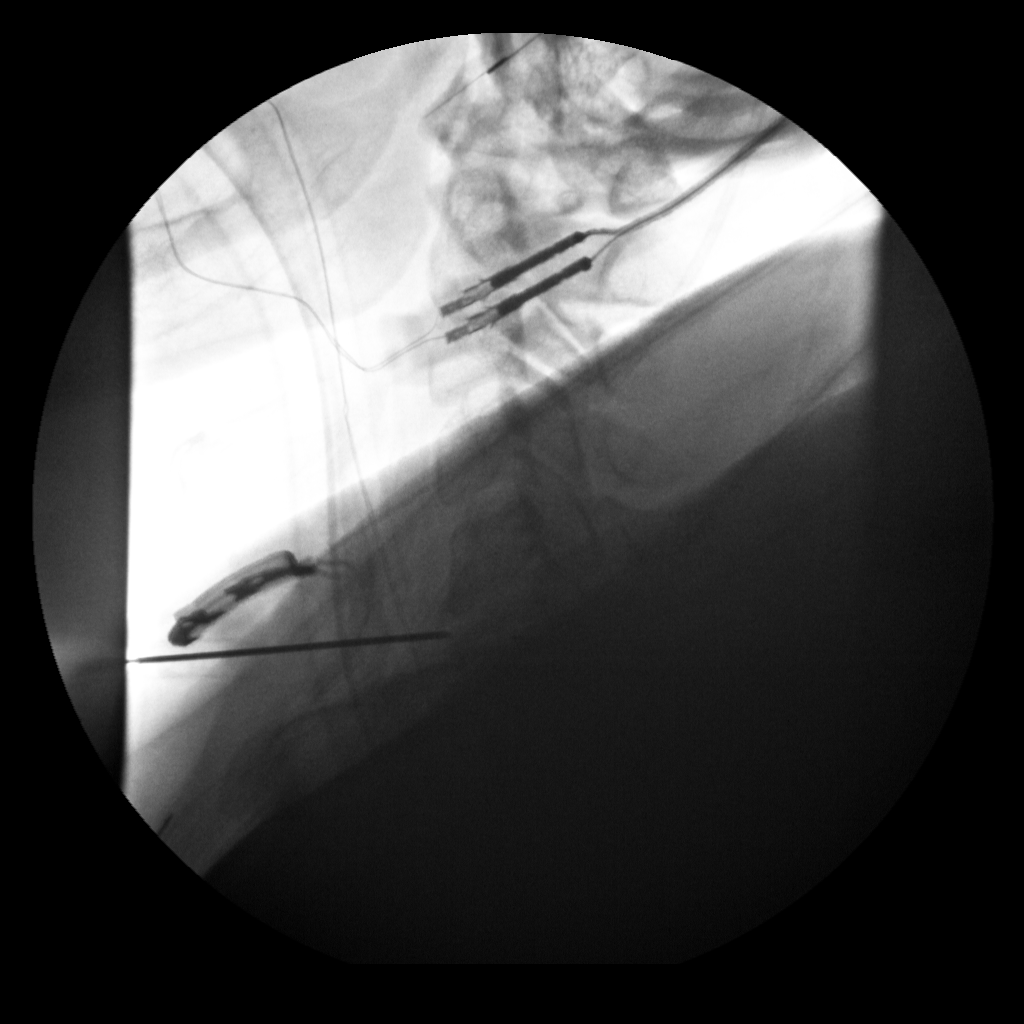
[im 2/3]
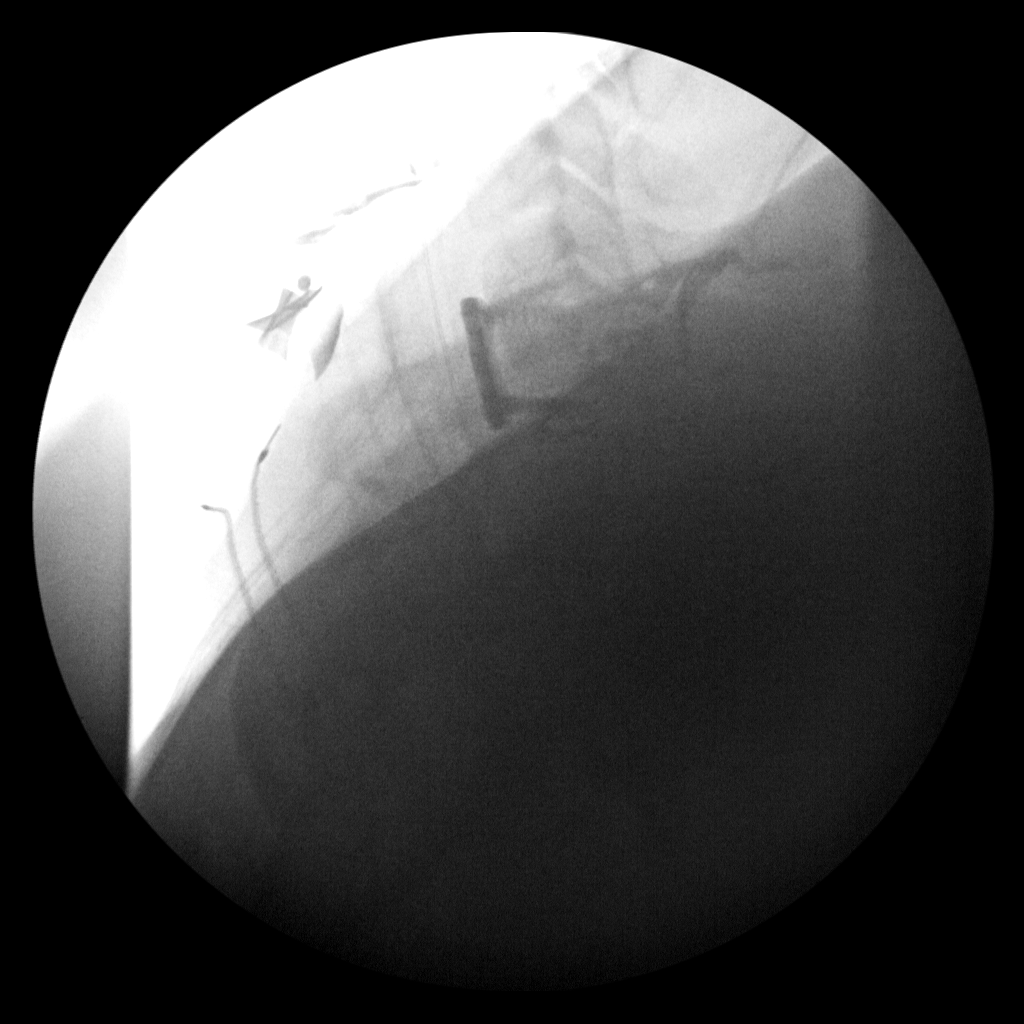
[im 3/3]
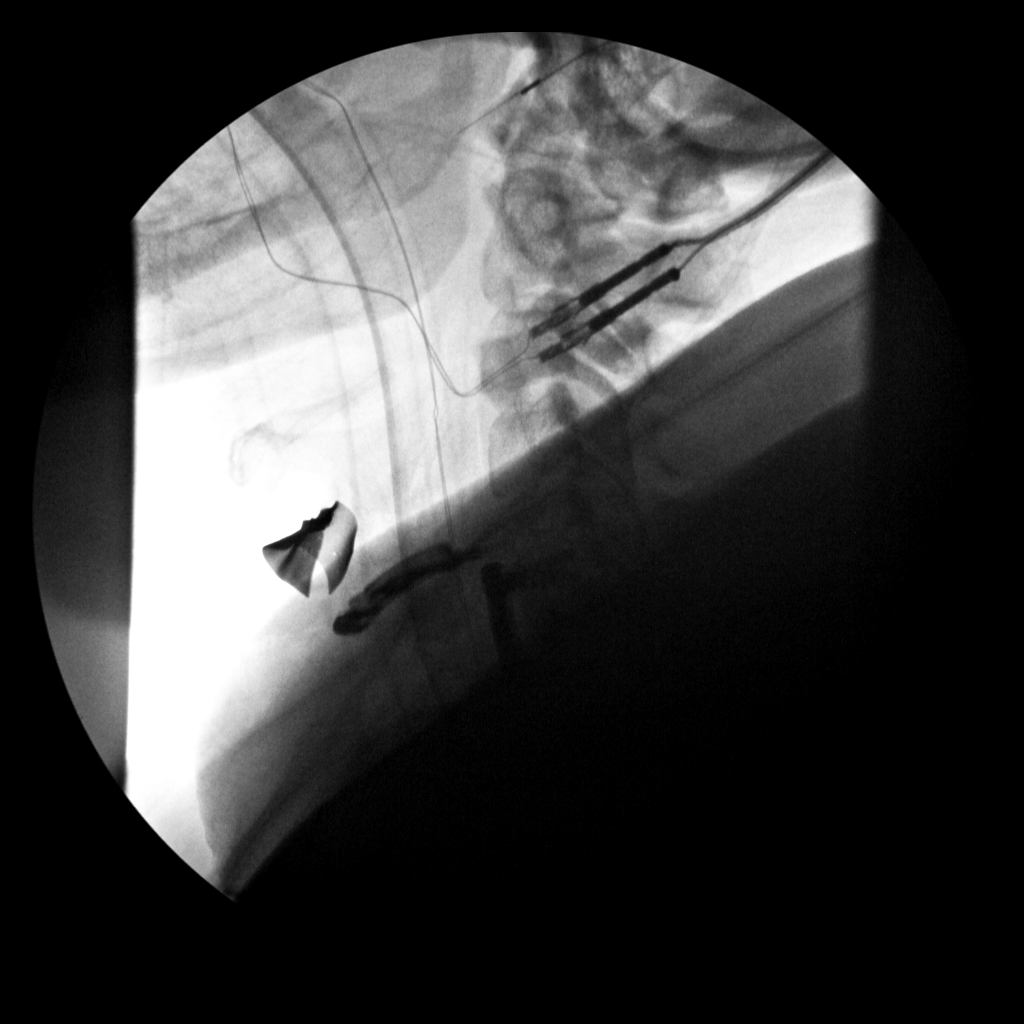

[3 of 3 positions shown; findings below may reference images not displayed]

FINDINGS: Three lateral view intraoperative fluoroscopic images of the
cervical spine are submitted. There is limited assessment of the
lower cervical levels due to superimposition of the patient's
shoulders. On the initial lateral view fluoroscopic image, a
metallic site marker projects ventral to the C5-C6 level. On
subsequent lateral view fluoroscopic images, there is ACDF hardware
at the C5-C6 level. No unexpected finding. Partially imaged support
tubes.
IMPRESSION: Three lateral view intraoperative fluoroscopic images of the
cervical spine, as described.

## 2020-05-25 IMAGING — RF DG C-ARM 1-60 MIN
1 series · 3 of 3 positions shown · non-contrast
Comparison: Cervical spine MRI [DATE]

CLINICAL DATA: Surgery, elective. Additional history provided: ACDF
C5-C6. Provided fluoroscopy time 17 seconds, 5.85 mGy

EXAM:
DG CERVICAL SPINE - 1 VIEW; DG C-ARM 1-60 MIN

[Series 1: run · 3 of 3 slices shown]
[im 1/3]
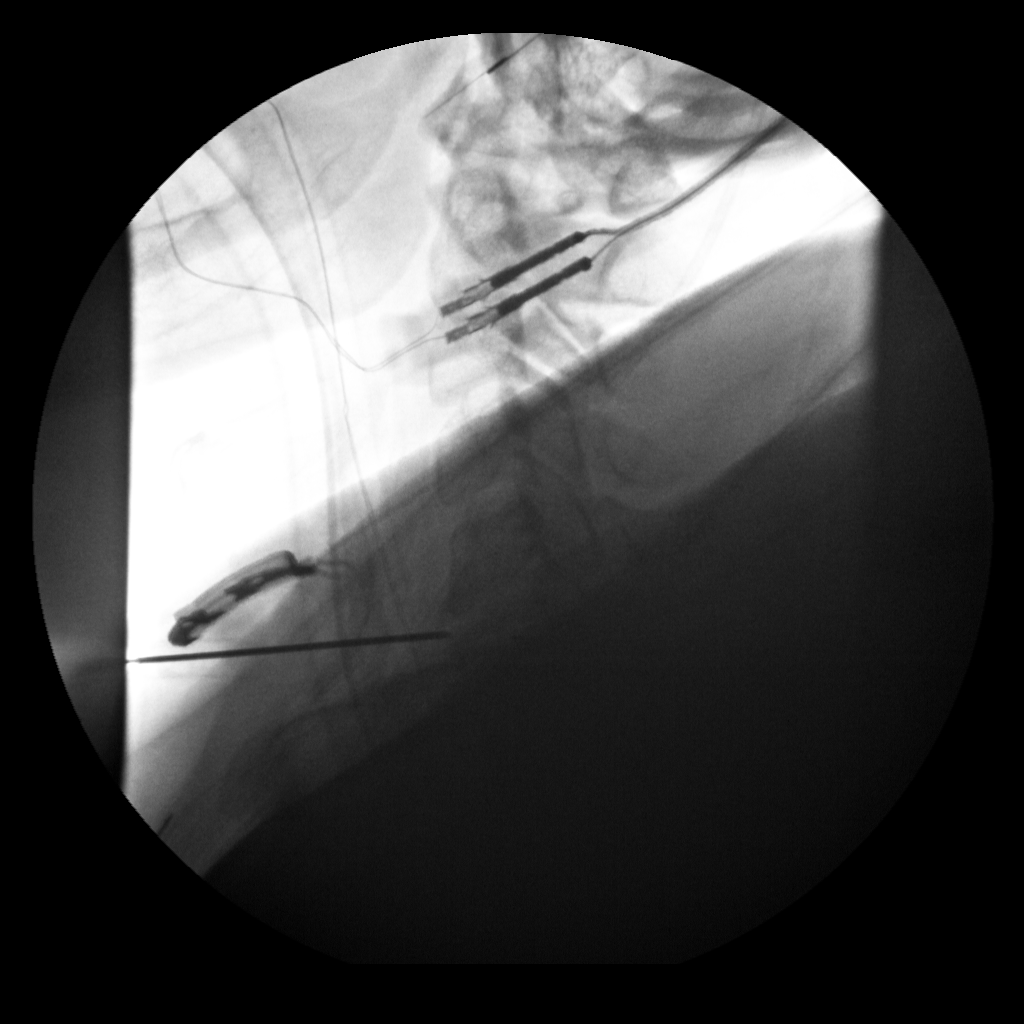
[im 2/3]
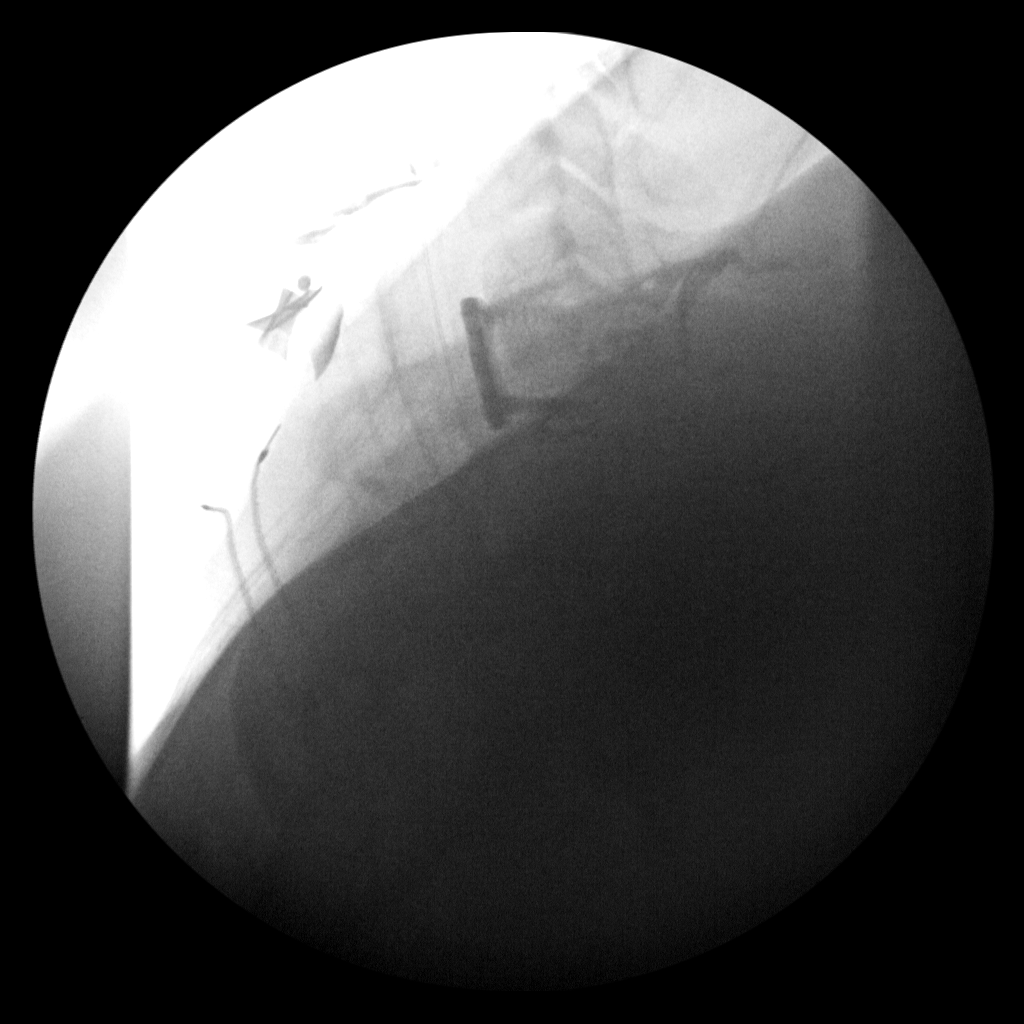
[im 3/3]
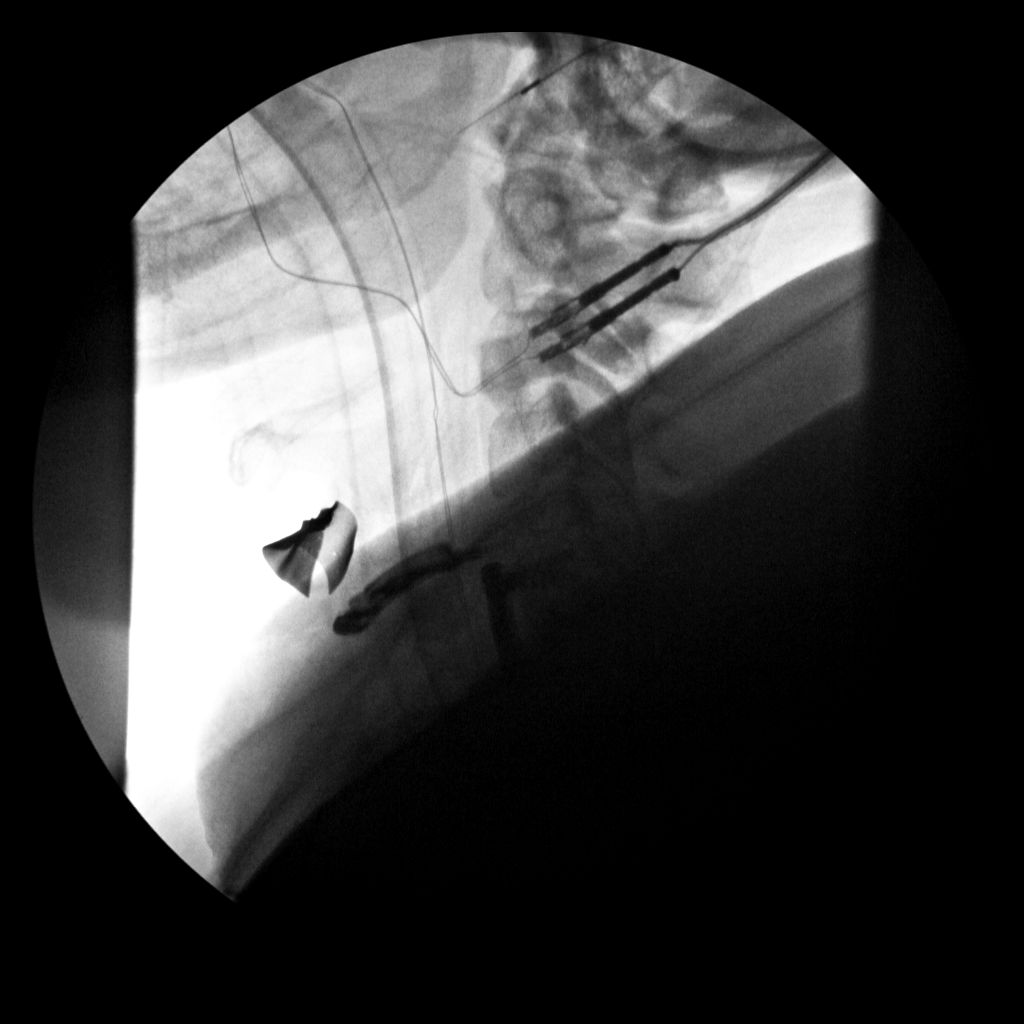

[3 of 3 positions shown; findings below may reference images not displayed]

FINDINGS: Three lateral view intraoperative fluoroscopic images of the
cervical spine are submitted. There is limited assessment of the
lower cervical levels due to superimposition of the patient's
shoulders. On the initial lateral view fluoroscopic image, a
metallic site marker projects ventral to the C5-C6 level. On
subsequent lateral view fluoroscopic images, there is ACDF hardware
at the C5-C6 level. No unexpected finding. Partially imaged support
tubes.
IMPRESSION: Three lateral view intraoperative fluoroscopic images of the
cervical spine, as described.

## 2020-05-25 SURGERY — ANTERIOR CERVICAL DECOMPRESSION/DISCECTOMY FUSION 1 LEVEL
Anesthesia: General

## 2020-05-25 MED ORDER — FENTANYL CITRATE (PF) 250 MCG/5ML IJ SOLN
INTRAMUSCULAR | Status: AC
  Filled 2020-05-25: qty 5

## 2020-05-25 MED ORDER — ACETAMINOPHEN 650 MG RE SUPP: 650.0000 mg | RECTAL | Status: DC | PRN

## 2020-05-25 MED ORDER — MIDAZOLAM HCL 2 MG/2ML IJ SOLN
1.0000 mg | Freq: Once | INTRAMUSCULAR | Status: AC
  Administered 2020-05-25: 1 mg via INTRAVENOUS

## 2020-05-25 MED ORDER — ACETAMINOPHEN 500 MG PO TABS
ORAL_TABLET | ORAL | Status: AC
  Administered 2020-05-25: 1000 mg via ORAL
  Filled 2020-05-25: qty 2

## 2020-05-25 MED ORDER — FENTANYL CITRATE (PF) 100 MCG/2ML IJ SOLN
25.0000 ug | INTRAMUSCULAR | Status: DC | PRN
  Administered 2020-05-25 (×3): 50 ug via INTRAVENOUS

## 2020-05-25 MED ORDER — 0.9 % SODIUM CHLORIDE (POUR BTL) OPTIME
TOPICAL | Status: DC | PRN
  Administered 2020-05-25 (×2): 1000 mL

## 2020-05-25 MED ORDER — ROCURONIUM BROMIDE 10 MG/ML (PF) SYRINGE
PREFILLED_SYRINGE | INTRAVENOUS | Status: AC
  Filled 2020-05-25: qty 10

## 2020-05-25 MED ORDER — DEXAMETHASONE SODIUM PHOSPHATE 10 MG/ML IJ SOLN
INTRAMUSCULAR | Status: AC
  Filled 2020-05-25: qty 1

## 2020-05-25 MED ORDER — ALBUTEROL SULFATE HFA 108 (90 BASE) MCG/ACT IN AERS
INHALATION_SPRAY | RESPIRATORY_TRACT | Status: DC | PRN
Start: 2020-05-25 — End: 2020-05-25
  Administered 2020-05-25 (×4): 2 via RESPIRATORY_TRACT

## 2020-05-25 MED ORDER — FENTANYL CITRATE (PF) 100 MCG/2ML IJ SOLN
INTRAMUSCULAR | Status: AC
  Filled 2020-05-25: qty 2

## 2020-05-25 MED ORDER — HYDROCODONE-ACETAMINOPHEN 10-325 MG PO TABS
1.0000 | ORAL_TABLET | ORAL | Status: DC | PRN
  Administered 2020-05-25: 1 via ORAL
  Filled 2020-05-25: qty 1

## 2020-05-25 MED ORDER — LACTATED RINGERS IV SOLN: INTRAVENOUS | Status: DC

## 2020-05-25 MED ORDER — SUCCINYLCHOLINE CHLORIDE 200 MG/10ML IV SOSY
PREFILLED_SYRINGE | INTRAVENOUS | Status: DC | PRN
  Administered 2020-05-25: 200 mg via INTRAVENOUS

## 2020-05-25 MED ORDER — DEXAMETHASONE SODIUM PHOSPHATE 4 MG/ML IJ SOLN
4.0000 mg | Freq: Four times a day (QID) | INTRAMUSCULAR | Status: DC
  Administered 2020-05-25 – 2020-05-26 (×2): 4 mg via INTRAVENOUS
  Filled 2020-05-25 (×2): qty 1

## 2020-05-25 MED ORDER — GABAPENTIN 300 MG PO CAPS
300.0000 mg | ORAL_CAPSULE | Freq: Three times a day (TID) | ORAL | Status: DC
  Administered 2020-05-25 – 2020-05-26 (×4): 300 mg via ORAL
  Filled 2020-05-25 (×4): qty 1

## 2020-05-25 MED ORDER — GABAPENTIN 300 MG PO CAPS: 300.0000 mg | ORAL_CAPSULE | Freq: Every day | ORAL | Status: DC

## 2020-05-25 MED ORDER — LIDOCAINE 2% (20 MG/ML) 5 ML SYRINGE
INTRAMUSCULAR | Status: DC | PRN
  Administered 2020-05-25: 60 mg via INTRAVENOUS

## 2020-05-25 MED ORDER — SODIUM CHLORIDE 0.9 % IV SOLN
0.0500 ug/kg/min | INTRAVENOUS | Status: AC
  Administered 2020-05-25: .2 ug/kg/min via INTRAVENOUS
  Filled 2020-05-25: qty 4000

## 2020-05-25 MED ORDER — BUPIVACAINE HCL 0.5 % IJ SOLN
INTRAMUSCULAR | Status: DC | PRN
  Administered 2020-05-25: 5 mL

## 2020-05-25 MED ORDER — MORPHINE SULFATE (PF) 2 MG/ML IV SOLN
1.0000 mg | INTRAVENOUS | Status: DC | PRN
  Administered 2020-05-25 – 2020-05-26 (×2): 1 mg via INTRAVENOUS
  Filled 2020-05-25 (×2): qty 1

## 2020-05-25 MED ORDER — POLYETHYLENE GLYCOL 3350 17 G PO PACK: 17.0000 g | PACK | Freq: Every day | ORAL | Status: DC | PRN

## 2020-05-25 MED ORDER — ALUM & MAG HYDROXIDE-SIMETH 200-200-20 MG/5ML PO SUSP: 30.0000 mL | Freq: Four times a day (QID) | ORAL | Status: DC | PRN

## 2020-05-25 MED ORDER — PHENYLEPHRINE HCL-NACL 10-0.9 MG/250ML-% IV SOLN
INTRAVENOUS | Status: DC | PRN
Start: 2020-05-25 — End: 2020-05-25
  Administered 2020-05-25: 50 ug/min via INTRAVENOUS

## 2020-05-25 MED ORDER — SODIUM CHLORIDE 0.9% FLUSH: 3.0000 mL | INTRAVENOUS | Status: DC | PRN

## 2020-05-25 MED ORDER — MIDAZOLAM HCL 2 MG/2ML IJ SOLN
INTRAMUSCULAR | Status: AC
  Filled 2020-05-25: qty 2

## 2020-05-25 MED ORDER — LIDOCAINE 2% (20 MG/ML) 5 ML SYRINGE
INTRAMUSCULAR | Status: AC
  Filled 2020-05-25: qty 5

## 2020-05-25 MED ORDER — ACETAMINOPHEN 500 MG PO TABS: 1000.0000 mg | ORAL_TABLET | Freq: Once | ORAL | Status: AC

## 2020-05-25 MED ORDER — ONDANSETRON HCL 4 MG/2ML IJ SOLN: 4.0000 mg | Freq: Four times a day (QID) | INTRAMUSCULAR | Status: DC | PRN

## 2020-05-25 MED ORDER — PROPOFOL 10 MG/ML IV BOLUS
INTRAVENOUS | Status: DC | PRN
  Administered 2020-05-25 (×2): 100 mg via INTRAVENOUS
  Administered 2020-05-25: 200 mg via INTRAVENOUS

## 2020-05-25 MED ORDER — DEXAMETHASONE 4 MG PO TABS
4.0000 mg | ORAL_TABLET | Freq: Four times a day (QID) | ORAL | Status: DC
  Administered 2020-05-26 (×2): 4 mg via ORAL
  Filled 2020-05-25 (×3): qty 1

## 2020-05-25 MED ORDER — ONDANSETRON HCL 4 MG PO TABS: 4.0000 mg | ORAL_TABLET | Freq: Four times a day (QID) | ORAL | Status: DC | PRN

## 2020-05-25 MED ORDER — MIDAZOLAM HCL 2 MG/2ML IJ SOLN
INTRAMUSCULAR | Status: DC | PRN
  Administered 2020-05-25: 2 mg via INTRAVENOUS

## 2020-05-25 MED ORDER — PANTOPRAZOLE SODIUM 40 MG IV SOLR
40.0000 mg | Freq: Every day | INTRAVENOUS | Status: DC
  Administered 2020-05-25: 40 mg via INTRAVENOUS
  Filled 2020-05-25: qty 40

## 2020-05-25 MED ORDER — BUPIVACAINE HCL (PF) 0.5 % IJ SOLN
INTRAMUSCULAR | Status: AC
  Filled 2020-05-25: qty 30

## 2020-05-25 MED ORDER — CHLORHEXIDINE GLUCONATE 0.12 % MT SOLN
OROMUCOSAL | Status: AC
  Administered 2020-05-25: 15 mL via OROMUCOSAL
  Filled 2020-05-25: qty 15

## 2020-05-25 MED ORDER — FENTANYL CITRATE (PF) 250 MCG/5ML IJ SOLN
INTRAMUSCULAR | Status: DC | PRN
  Administered 2020-05-25: 50 ug via INTRAVENOUS
  Administered 2020-05-25 (×3): 100 ug via INTRAVENOUS
  Administered 2020-05-25: 50 ug via INTRAVENOUS

## 2020-05-25 MED ORDER — CHLORHEXIDINE GLUCONATE 0.12 % MT SOLN: 15.0000 mL | Freq: Once | OROMUCOSAL | Status: AC

## 2020-05-25 MED ORDER — BISACODYL 5 MG PO TBEC: 5.0000 mg | DELAYED_RELEASE_TABLET | Freq: Every day | ORAL | Status: DC | PRN

## 2020-05-25 MED ORDER — HYDROCODONE-ACETAMINOPHEN 7.5-325 MG PO TABS
1.0000 | ORAL_TABLET | Freq: Four times a day (QID) | ORAL | Status: DC
  Administered 2020-05-26 – 2020-05-27 (×6): 1 via ORAL
  Filled 2020-05-25 (×5): qty 1

## 2020-05-25 MED ORDER — THROMBIN (RECOMBINANT) 20000 UNITS EX SOLR
CUTANEOUS | Status: AC
  Filled 2020-05-25: qty 20000

## 2020-05-25 MED ORDER — MENTHOL 3 MG MT LOZG: 1.0000 | LOZENGE | OROMUCOSAL | Status: DC | PRN

## 2020-05-25 MED ORDER — DOCUSATE SODIUM 100 MG PO CAPS
100.0000 mg | ORAL_CAPSULE | Freq: Two times a day (BID) | ORAL | Status: DC
  Administered 2020-05-25 – 2020-05-26 (×2): 100 mg via ORAL
  Filled 2020-05-25 (×3): qty 1

## 2020-05-25 MED ORDER — ACETAMINOPHEN 325 MG PO TABS: 650.0000 mg | ORAL_TABLET | ORAL | Status: DC | PRN

## 2020-05-25 MED ORDER — TETRACYCLINE HCL 250 MG PO CAPS
500.0000 mg | ORAL_CAPSULE | Freq: Every day | ORAL | Status: DC
  Administered 2020-05-26: 500 mg via ORAL
  Filled 2020-05-25 (×3): qty 2

## 2020-05-25 MED ORDER — PROPOFOL 10 MG/ML IV BOLUS
INTRAVENOUS | Status: AC
  Filled 2020-05-25: qty 20

## 2020-05-25 MED ORDER — THROMBIN 20000 UNITS EX SOLR
CUTANEOUS | Status: DC | PRN
  Administered 2020-05-25: 20 mL

## 2020-05-25 MED ORDER — ONDANSETRON HCL 4 MG/2ML IJ SOLN
INTRAMUSCULAR | Status: AC
  Filled 2020-05-25: qty 2

## 2020-05-25 MED ORDER — DEXMEDETOMIDINE HCL IN NACL 200 MCG/50ML IV SOLN
INTRAVENOUS | Status: DC | PRN
  Administered 2020-05-25 (×2): 12 ug via INTRAVENOUS
  Administered 2020-05-25: 16 ug via INTRAVENOUS

## 2020-05-25 MED ORDER — PROPOFOL 1000 MG/100ML IV EMUL
INTRAVENOUS | Status: AC
  Filled 2020-05-25: qty 200

## 2020-05-25 MED ORDER — ORAL CARE MOUTH RINSE: 15.0000 mL | Freq: Once | OROMUCOSAL | Status: AC

## 2020-05-25 MED ORDER — METHOCARBAMOL 1000 MG/10ML IJ SOLN
500.0000 mg | Freq: Four times a day (QID) | INTRAVENOUS | Status: DC | PRN
  Filled 2020-05-25: qty 5

## 2020-05-25 MED ORDER — METHOCARBAMOL 500 MG PO TABS
500.0000 mg | ORAL_TABLET | Freq: Four times a day (QID) | ORAL | Status: DC | PRN
  Administered 2020-05-26 (×3): 500 mg via ORAL
  Filled 2020-05-25 (×3): qty 1

## 2020-05-25 MED ORDER — DEXAMETHASONE SODIUM PHOSPHATE 10 MG/ML IJ SOLN
INTRAMUSCULAR | Status: DC | PRN
  Administered 2020-05-25: 10 mg via INTRAVENOUS

## 2020-05-25 MED ORDER — FLEET ENEMA 7-19 GM/118ML RE ENEM: 1.0000 | ENEMA | Freq: Once | RECTAL | Status: DC | PRN

## 2020-05-25 MED ORDER — HYDROCODONE-ACETAMINOPHEN 10-325 MG PO TABS
2.0000 | ORAL_TABLET | ORAL | Status: DC | PRN
  Administered 2020-05-26: 2 via ORAL
  Filled 2020-05-25: qty 2

## 2020-05-25 MED ORDER — CEFAZOLIN SODIUM-DEXTROSE 2-4 GM/100ML-% IV SOLN
2.0000 g | Freq: Three times a day (TID) | INTRAVENOUS | Status: AC
  Administered 2020-05-25 – 2020-05-26 (×2): 2 g via INTRAVENOUS
  Filled 2020-05-25 (×2): qty 100

## 2020-05-25 MED ORDER — PROPOFOL 500 MG/50ML IV EMUL
INTRAVENOUS | Status: DC | PRN
Start: 2020-05-25 — End: 2020-05-25
  Administered 2020-05-25: 50 ug/kg/min via INTRAVENOUS

## 2020-05-25 MED ORDER — SODIUM CHLORIDE 0.9% FLUSH
3.0000 mL | Freq: Two times a day (BID) | INTRAVENOUS | Status: DC
  Administered 2020-05-26: 3 mL via INTRAVENOUS

## 2020-05-25 MED ORDER — BUPIVACAINE LIPOSOME 1.3 % IJ SUSP: INTRAMUSCULAR | Status: DC | PRN

## 2020-05-25 MED ORDER — SODIUM CHLORIDE 0.9 % IV SOLN: 250.0000 mL | INTRAVENOUS | Status: DC

## 2020-05-25 MED ORDER — SODIUM CHLORIDE 0.9 % IV SOLN: INTRAVENOUS | Status: DC

## 2020-05-25 MED ORDER — CLINDAMYCIN PHOSPHATE 1 % EX GEL
1.0000 "application " | Freq: Two times a day (BID) | CUTANEOUS | Status: DC | PRN
  Filled 2020-05-25: qty 30

## 2020-05-25 MED ORDER — PHENOL 1.4 % MT LIQD: 1.0000 | OROMUCOSAL | Status: DC | PRN

## 2020-05-25 MED ORDER — ONDANSETRON HCL 4 MG/2ML IJ SOLN
INTRAMUSCULAR | Status: DC | PRN
  Administered 2020-05-25: 4 mg via INTRAVENOUS

## 2020-05-25 SURGICAL SUPPLY — 71 items
BENZOIN TINCTURE PRP APPL 2/3 (GAUZE/BANDAGES/DRESSINGS) ×3 IMPLANT
BIT DRILL SRG 14X2.2XFLT CHK (BIT) ×1 IMPLANT
BIT DRL SRG 14X2.2XFLT CHK (BIT) ×1
BLADE CLIPPER SURG (BLADE) IMPLANT
BONE VIVIGEN FORMABLE 1.3CC (Bone Implant) ×3 IMPLANT
BUR MATCHSTICK NEURO 3.0 LAGG (BURR) IMPLANT
BUR RND FLUTED 2.5 (BURR) IMPLANT
BUR SABER RD CUTTING 3.0 (BURR) IMPLANT
BUR SABER RD CUTTING 3.0MM (BURR)
CANISTER SUCT 3000ML PPV (MISCELLANEOUS) ×3 IMPLANT
CLOSURE WOUND 1/2 X4 (GAUZE/BANDAGES/DRESSINGS) ×1
COLLAR CERV LO CONTOUR FIRM DE (SOFTGOODS) ×3 IMPLANT
COVER SURGICAL LIGHT HANDLE (MISCELLANEOUS) ×3 IMPLANT
COVER WAND RF STERILE (DRAPES) ×3 IMPLANT
DERMABOND ADVANCED (GAUZE/BANDAGES/DRESSINGS) ×2
DERMABOND ADVANCED .7 DNX12 (GAUZE/BANDAGES/DRESSINGS) ×1 IMPLANT
DRAIN TLS ROUND 10FR (DRAIN) IMPLANT
DRAPE C-ARM 42X72 X-RAY (DRAPES) IMPLANT
DRAPE MICROSCOPE LEICA (MISCELLANEOUS) ×3 IMPLANT
DRAPE POUCH INSTRU U-SHP 10X18 (DRAPES) ×3 IMPLANT
DRAPE SURG 17X23 STRL (DRAPES) ×9 IMPLANT
DRILL BIT SKYLINE 14MM (BIT) ×2
DRSG MEPILEX BORDER 4X4 (GAUZE/BANDAGES/DRESSINGS) ×3 IMPLANT
DURAPREP 6ML APPLICATOR 50/CS (WOUND CARE) ×3 IMPLANT
ELECT BLADE 4.0 EZ CLEAN MEGAD (MISCELLANEOUS)
ELECT COATED BLADE 2.86 ST (ELECTRODE) ×3 IMPLANT
ELECT REM PT RETURN 9FT ADLT (ELECTROSURGICAL) ×3
ELECTRODE BLDE 4.0 EZ CLN MEGD (MISCELLANEOUS) IMPLANT
ELECTRODE REM PT RTRN 9FT ADLT (ELECTROSURGICAL) ×1 IMPLANT
Exparel IMPLANT
FEE INTRAOP MONITOR IMPULS NCS (MISCELLANEOUS) ×1 IMPLANT
GAUZE SPONGE 4X4 12PLY STRL (GAUZE/BANDAGES/DRESSINGS) ×3 IMPLANT
GLOVE BIOGEL PI IND STRL 8 (GLOVE) ×1 IMPLANT
GLOVE BIOGEL PI INDICATOR 8 (GLOVE) ×2
GLOVE ECLIPSE 9.0 STRL (GLOVE) ×3 IMPLANT
GLOVE ORTHO TXT STRL SZ7.5 (GLOVE) ×3 IMPLANT
GLOVE SURG 8.5 LATEX PF (GLOVE) ×3 IMPLANT
GOWN STRL REUS W/ TWL LRG LVL3 (GOWN DISPOSABLE) ×1 IMPLANT
GOWN STRL REUS W/TWL 2XL LVL3 (GOWN DISPOSABLE) ×6 IMPLANT
GOWN STRL REUS W/TWL LRG LVL3 (GOWN DISPOSABLE) ×2
HALTER HD/CHIN CERV TRACTION D (MISCELLANEOUS) ×3 IMPLANT
INTRAOP MONITOR FEE IMPULS NCS (MISCELLANEOUS) ×1
INTRAOP MONITOR FEE IMPULSE (MISCELLANEOUS) ×3
KIT BASIN OR (CUSTOM PROCEDURE TRAY) ×3 IMPLANT
KIT TURNOVER KIT B (KITS) ×3 IMPLANT
NEEDLE SPNL 20GX3.5 QUINCKE YW (NEEDLE) ×6 IMPLANT
NS IRRIG 1000ML POUR BTL (IV SOLUTION) ×3 IMPLANT
PACK ORTHO CERVICAL (CUSTOM PROCEDURE TRAY) ×3 IMPLANT
PAD ARMBOARD 7.5X6 YLW CONV (MISCELLANEOUS) ×6 IMPLANT
PATTIES SURGICAL .5 X.5 (GAUZE/BANDAGES/DRESSINGS) IMPLANT
PIN DISTRACTION 12MM (PIN) ×6
PIN DISTRACTION 14MM (PIN) IMPLANT
PIN DSTRCT 12XNS SS ACIS (PIN) ×2 IMPLANT
PLATE ONE LEVEL SKYLINE 14MM (Plate) ×3 IMPLANT
SCREW VAR SELF TAP SKYLINE 14M (Screw) ×12 IMPLANT
SPACER ADV ACF LORODTIC 8MM (Bone Implant) ×3 IMPLANT
SPONGE INTESTINAL PEANUT (DISPOSABLE) IMPLANT
SPONGE LAP 4X18 RFD (DISPOSABLE) IMPLANT
SPONGE SURGIFOAM ABS GEL 100 (HEMOSTASIS) ×3 IMPLANT
STRIP CLOSURE SKIN 1/2X4 (GAUZE/BANDAGES/DRESSINGS) ×2 IMPLANT
SUT ETHILON 4 0 PS 2 18 (SUTURE) IMPLANT
SUT VIC AB 2-0 CT1 27 (SUTURE) ×9
SUT VIC AB 2-0 CT1 TAPERPNT 27 (SUTURE) ×3 IMPLANT
SUT VIC AB 3-0 X1 27 (SUTURE) IMPLANT
SUT VICRYL 4-0 PS2 18IN ABS (SUTURE) ×3 IMPLANT
SYR 20ML LL LF (SYRINGE) ×3 IMPLANT
SYSTEM CHEST DRAIN TLS 7FR (DRAIN) ×3 IMPLANT
TOWEL GREEN STERILE (TOWEL DISPOSABLE) ×3 IMPLANT
TOWEL GREEN STERILE FF (TOWEL DISPOSABLE) ×3 IMPLANT
TUBE EVACUATION TLS (MISCELLANEOUS) ×3 IMPLANT
WATER STERILE IRR 1000ML POUR (IV SOLUTION) ×3 IMPLANT

## 2020-05-25 NOTE — Anesthesia Postprocedure Evaluation (Signed)
Anesthesia Post Note  Patient: Optometrist  Procedure(s) Performed: ANTERIOR CERVICAL DISCECTOMY AND FUSION CERVICAL FIVE THROUGH CERVICAL SIX WITH PLATES, SCREWS, ALLOGRAFT AND LOCAL BONE GRAFT, VIVIGEN (N/A )     Patient location during evaluation: PACU Anesthesia Type: General Level of consciousness: sedated and responds to stimulation Pain management: pain level controlled Vital Signs Assessment: post-procedure vital signs reviewed and stable Respiratory status: spontaneous breathing, nonlabored ventilation, respiratory function stable and patient connected to nasal cannula oxygen Cardiovascular status: blood pressure returned to baseline and stable Postop Assessment: no apparent nausea or vomiting Anesthetic complications: no   No complications documented.  Last Vitals:  Vitals:   05/25/20 1742 05/25/20 1800  BP: 136/75   Pulse: 76 76  Resp: (!) 22 19  Temp:    SpO2: 97% 97%    Last Pain:  Vitals:   05/25/20 1800  TempSrc:   PainSc: Asleep                 Amontae Ng,E. Cameren Earnest

## 2020-05-25 NOTE — H&P (Signed)
PREOPERATIVE H&P  Chief Complaint: C5-6 herniated nucleus pulposus with myelopathy  HPI: Monica Huang is a 39 y.o. female who presents for preoperative history and physical with a diagnosis of C5-6 herniated nucleus pulposus with myelopathy. Symptoms are rated as moderate to severe, and have been worsening.  This is significantly impairing activities of daily living.  She has elected for surgical management.   Past Medical History:  Diagnosis Date  . Ambulates with cane   . Chronic back pain   . Chronic leg pain   . Diabetes mellitus without complication (HCC)    Type II - no meds  . Dizziness   . Hydradenitis   . Hyperlipidemia    diet controlle - no meds  . Legally blind    uses cane - some limited vision  . Macular degeneration    patient denies this dx  (dr street dx)  . Pneumonia    x 1  . Pseudotumor cerebri syndrome 2005   shunt placed- and legally blind  . Sciatica   . Sleep apnea    "Mild" - has appt on 06/23/20 to see about cpap machine  . Wears partial dentures    upper and lower   Past Surgical History:  Procedure Laterality Date  . CSF SHUNT     2 revisions  . MULTIPLE EXTRACTIONS WITH ALVEOLOPLASTY Bilateral 11/02/2017   Procedure: MULTIPLE EXTRACTION;  Surgeon: Ocie Doyne, DDS;  Location: [facility] OR;  Service: Oral Surgery;  Laterality: Bilateral;   Social History   Socioeconomic History  . Marital status: Single    Spouse name: Not on file  . Number of children: Not on file  . Years of education: Not on file  . Highest education level: Not on file  Occupational History  . Not on file  Tobacco Use  . Smoking status: Current Every Day Smoker    Packs/day: 0.50    Years: 21.00    Pack years: 10.50    Types: Cigarettes  . Smokeless tobacco: Never Used  Vaping Use  . Vaping Use: Never used  Substance and Sexual Activity  . Alcohol use: Yes    Comment: occasional  . Drug use: Yes    Types: Marijuana    Comment: marijuana -- bag per day. - -4  joints/day  . Sexual activity: Yes    Birth control/protection: None    Comment: 1 partners  Other Topics Concern  . Not on file  Social History Narrative   On disability since 2005 due to vision loss from psuedotumor cerebri.   Did not finish highschool-- completed the 9th grade.    Walking 2 x week for .          Social Determinants of Health   Financial Resource Strain:   . Difficulty of Paying Living Expenses:   Food Insecurity:   . Worried About Programme researcher, broadcasting/film/video in the Last Year:   . Barista in the Last Year:   Transportation Needs:   . Freight forwarder (Medical):   Marland Kitchen Lack of Transportation (Non-Medical):   Physical Activity:   . Days of Exercise per Week:   . Minutes of Exercise per Session:   Stress:   . Feeling of Stress :   Social Connections:   . Frequency of Communication with Friends and Family:   . Frequency of Social Gatherings with Friends and Family:   . Attends Religious Services:   . Active Member of Clubs or Organizations:   . Attends  Club or Organization Meetings:   Marland Kitchen Marital Status:    Family History  Problem Relation Age of Onset  . Diabetes Mother   . Hypertension Mother   . Diabetes Father    No Known Allergies Prior to Admission medications   Medication Sig Start Date End Date Taking? Authorizing Provider  clindamycin (CLINDAGEL) 1 % gel Apply topically 2 (two) times daily. Patient taking differently: Apply 1 application topically 2 (two) times daily as needed (rash/boils).  04/28/20  Yes Darin Engels, Sherin, DO  gabapentin (NEURONTIN) 300 MG capsule Take 1 capsule (300 mg total) by mouth at bedtime. 05/13/20  Yes Kerrin Champagne, MD  HYDROcodone-acetaminophen (NORCO/VICODIN) 5-325 MG tablet Take 1 tablet by mouth every 6 (six) hours as needed for moderate pain. Patient taking differently: Take 1 tablet by mouth at bedtime.  05/13/20  Yes Kerrin Champagne, MD  methylPREDNISolone (MEDROL DOSEPAK) 4 MG TBPK tablet Take as directed, 6  day dose pak. Patient taking differently: Take 4-24 mg by mouth See admin instructions. (Typical regimens for 21 tablet dose packs of Methylprednisolone 4mg , Prednisone 5mg , and Prednisone 10mg ) Day 1: 2 tabs before breakfast, 1 tab after lunch, 1 tab after supper, and 2 tabs at bedtime. Day 2: 1 tab before breakfast, 1 tab after lunch, 1 tab after supper, and 2 tabs at bedtime. Day 3: 1 tab before breakfast, 1 tab after lunch, 1 tab after supper, and 1 tab at bedtime. Day 4: 1 tab before breakfast, 1 tab after lunch, and 1 tab at bedtime. Day 5: 1 tab before breakfast and 1 tab at bedtime. Day 6: 1 tab before breakfast. 05/13/20  Yes , MD  tetracycline (SUMYCIN) 500 MG capsule Take 500 mg by mouth daily. 04/21/20  Yes [provider]     Positive ROS: All other systems have been reviewed and were otherwise negative with the exception of those mentioned in the HPI and as above.  Physical Exam:Obese, BMI >45 General: Alert, no acute distress Cardiovascular: No pedal edema Respiratory: No cyanosis, no use of accessory musculature GI: No organomegaly, abdomen is soft and non-tender Skin: No lesions in the area of chief complaint Neurologic: Sensation intact distally Psychiatric: Patient is competent for consent with normal mood and affect Lymphatic: No axillary or cervical lymphadenopathy  MUSCULOSKELETAL:Left arm with left triceps weakness 4/5, left wirist volar flexion 4/5. Left finger extension 4/5 right arm motor is normal. ROM decreased left lateral rotation and bending by nearly 50%. Hoffman's sign is negative. Knee reflexes 2+/2+   MRI CERVICAL SPINE WITHOUT CONTRAST  TECHNIQUE: Multiplanar, multisequence MR imaging of the cervical spine was performed. No intravenous contrast was administered.  COMPARISON:  Cervical spine radiographs 02/27/2019  FINDINGS: Alignment: Normal alignment with straightening of the cervical lordosis  Vertebrae: Negative  for fracture or mass.  Normal bone marrow.  Cord: Cord evaluation is limited by motion. There is increased signal in the cord at C5-6 which appears to be related to spinal stenosis.  Posterior Fossa, vertebral arteries, paraspinal tissues: Negative  Disc levels:  C2-3: Small central disc protrusion.  Negative for stenosis.  C3-4: Small central disc protrusion. Mild cord flattening and mild spinal stenosis.  C4-5: Shallow central disc protrusion. Mild facet hypertrophy. Mild spinal stenosis and moderate left foraminal encroachment due to spurring.  C5-6: Moderate to large central disc protrusion with cord flattening and moderate spinal stenosis. Cord hyperintensity. Mild foraminal narrowing bilaterally  C6-7: Right paracentral disc protrusion with cord flattening on the right. No significant  spinal stenosis.  C7-T1: Negative  IMPRESSION: Multilevel degenerative change in the cervical spine as above. This is most prominent at C5-6 where there is a central disc protrusion causing moderate spinal stenosis and cord hyperintensity consistent with compressive myelopathy.   Electronically Signed   By: Marlan Palau M.D.   On: 05/10/2020 21:47 Assessment: C5-6 herniated nucleus pulposus with myelopathy  Plan: Plan for Procedure(s): ANTERIOR CERVICAL DISCECTOMY AND FUSION C5-6 WITH PLATES, SCREWS, ALLOGRAFT AND LOCAL BONE GRAFT, VIVIGEN  The risks benefits and alternatives were discussed with the patient including but not limited to the risks of nonoperative treatment, versus surgical intervention including infection, bleeding, nerve injury,  blood clots, cardiopulmonary complications, morbidity, mortality, among others, and they were willing to proceed.   Vira Browns, MD Cell [phone] Office [phone] 05/25/2020 12:50 PM

## 2020-05-25 NOTE — Progress Notes (Signed)
Patient requesting fiance to assist her in short stay due to impaired vision.  Accomodation made for patient safety.

## 2020-05-25 NOTE — Anesthesia Procedure Notes (Signed)
Procedure Name: Intubation Date/Time: 05/25/2020 1:29 PM Performed by: Rosiland Oz, CRNA Pre-anesthesia Checklist: Patient identified, Emergency Drugs available, Suction available, Patient being monitored and Timeout performed Patient Re-evaluated:Patient Re-evaluated prior to induction Oxygen Delivery Method: Circle system utilized Preoxygenation: Pre-oxygenation with 100% oxygen Induction Type: IV induction Laryngoscope Size: Glidescope and 4 Grade View: Grade I Tube type: Oral Tube size: 7.0 mm Number of attempts: 1 Airway Equipment and Method: Stylet and Video-laryngoscopy Placement Confirmation: ETT inserted through vocal cords under direct vision,  positive ETCO2 and breath sounds checked- equal and bilateral Secured at: 21 cm Tube secured with: Tape Dental Injury: Teeth and Oropharynx as per pre-operative assessment

## 2020-05-25 NOTE — Transfer of Care (Signed)
Immediate Anesthesia Transfer of Care Note  Patient: Optometrist  Procedure(s) Performed: ANTERIOR CERVICAL DISCECTOMY AND FUSION CERVICAL FIVE THROUGH CERVICAL SIX WITH PLATES, SCREWS, ALLOGRAFT AND LOCAL BONE GRAFT, VIVIGEN (N/A )  Patient Location: PACU  Anesthesia Type:General  Level of Consciousness: awake  Airway & Oxygen Therapy: Patient Spontanous Breathing  Post-op Assessment: Report given to RN and Patient moving all extremities X 4  Post vital signs: Reviewed and stable  Last Vitals:  Vitals Value Taken Time  BP 112/67 05/25/20 1656  Temp    Pulse 84 05/25/20 1657  Resp 21 05/25/20 1657  SpO2 92 % 05/25/20 1657  Vitals shown include unvalidated device data.  Last Pain:  Vitals:   05/25/20 1130  TempSrc: Temporal         Complications: No complications documented.

## 2020-05-25 NOTE — Discharge Instructions (Addendum)
No lifting greater than 10 lbs. No overhead use of arms. °Avoid bending,and twisting neck. °Walk in house for first week them may start to get out slowly increasing distance up to one mile by 3 weeks post op. °Keep incision dry for 3 days, may then bathe and wet incision using a [place] collar when showering. °Call if any fevers >101, chills, or increasing numbness or weakness or increased swelling or drainage. ° °

## 2020-05-25 NOTE — Op Note (Addendum)
05/25/2020  4:54 PM  PATIENT:  Monica Huang  39 y.o. female  MRN: [id number]  OPERATIVE REPORT  PRE-OPERATIVE DIAGNOSIS:  Cervical five through cervical six herniated nucleus pulposus with myelopathy  POST-OPERATIVE DIAGNOSIS:  Cervical five through cervical six herniated nucleus pulposus with myelopathy  PROCEDURE:  Procedure(s): ANTERIOR CERVICAL DISCECTOMY AND FUSION CERVICAL FIVE THROUGH CERVICAL SIX WITH PLATES, SCREWS, ALLOGRAFT AND LOCAL BONE GRAFT, VIVIGEN    SURGEON:  Jessy Oto, MD     ASSISTANT:  Benjiman Core, PA-C  (Present throughout the entire procedure and necessary for completion of procedure in a timely manner)     ANESTHESIA:  General,    COMPLICATIONS:  None.     COMPONENTS:  Implant Name Type Inv. Item Serial No. Manufacturer Lot No. LRB No. Used Action  BONE VIVIGEN FORMABLE 1.3CC - [id number] Bone Implant BONE VIVIGEN FORMABLE 1.3CC [id number] [employer]  N/A 1 Implanted  SPACER ADV ACF LORODTIC 8MM - [id number] Bone Implant SPACER ADV ACF LORODTIC 8MM [id number] [employer] FNDN  N/A 1 Implanted  PLATE ONE LEVEL SKYLINE 20MM - [id number] Plate PLATE ONE LEVEL SKYLINE 20MM  [employer]  N/A 1 Implanted  SCREW VAR SELF TAP SKYLINE 20M - [id number] Screw SCREW VAR SELF TAP SKYLINE 20M  [employer]  N/A 4 Implanted  INTRAOPERATIVE NEURAL MONITORING: SEP and MEP performed through out the case. The initial reading indicated a slowing of the right lower extremity sensory and motor evoked potential. This reading gradually improved as the case proceeded.  There was no intraoperative loss of signal. PROCEDURE:The patient was met in the holding area, and the appropriate  left C5-6 cervical level identified and marked with an "x" and my initials. The patient was then transported to OR and was placed on the operative table in a supine position head supported on the well padded [place] horseshoe.  The patient was then placed under  general anesthesia without difficulty intubated atraumaticly.      Cervical spine was positioned with a [place] horseshoe and 5 pound cervical halter traction. All pressure points well padded and semi-beach chair position. Arm holder both arms. Standard prep with DuraPrep solution the anterior cervical spine chest. Draped in the usual manner. Iodine vi drape was used. Standard timeout protocol was carried out identifying the patient procedure side of the procedure and level. The skin the left neck was infiltrated with Marcaine 0.5% total of 10 cc. This at the level of expected C5-6 incision and also along a skin crease in line with the patients lines of Langer. Incision transverse at the C6 level and carried down to the level of the platysma. Then was carried down to the anterior aspect of the sternocleidomastoid muscle. The interval between the trachea and esophagus medially and the carotid sheath laterally was developed as a Metzenbaum scissors and blunt dissection exposing the anterior aspect of cervical spine at the C6 level. Omohyoid muscle divided.  The prevertebral fascia anterior to the cervical was cauterized with bipolar and teased across the midline with a Art therapist. An 18-gauge spinal needle was placed with sheath intact allowing only a centimeter to extend into the C5-6 disc and observed on lateral radiogragh  at the C5-6 level. Handheld Cloward retraction of the soft tissues while identifying the level at C5-6 and also while removing a portion of the anterior aspect of the disc with15 blade scalpel and pituitary. Medial border of the longus collie muscles was carefully elevated bilaterally and self-retaining  retractors were introduced the foot of the blade beneath the medial border of the longus colli muscles. Soft tissue overlying the anterior borders of the disc space at C5-6 level carefully debridement of soft tissue back to bony edges. The anterior lip  osteophytes were then resected using rongeur then Kerrisons. This bone was saved for later bone grafting purposes. The disc space was then first prepared using loupe magnification and headlight with resection of degenerative disc annulus anteriorly and posteriorly and cartilaginous endplates using micro-curettes pituitaries and a high-speed bur. The operating room microscope was draped sterilely and brought into the field. Under the operating room microscope and posterior aspect of the disc was excised using micro-curettes pituitary rongeur and times per. Posterior lip osteophytes were drilled using a high-speed bur and a carefully resected with 1 and 2 mm Kerrison foraminotomy performed over both C6 nerve roots using 1 and 2 mm Kerrisons disc herniation material was noted centrally and into the left foramen some of which represented reflected portion of the patient's annulus into the neuroforamen. A mid line osteophyte was present impressing on the very volar central cord and this was freed with titanium micronerve hook and 1 mm Kerrison. Following decompression of the spinal cord and both C6 nerve roots, irrigation was carried out. The endplates of the inferior aspect of C5 and superior aspect of C6 were carefully prepared using a high-speed bur to parallel. The disc space was then sounded utilized and a precontoured sounder for the transgraft implant. Surrounding was carried up to a 102m implant.  8 mm ACF lordotic allograft spacer was felt to provide best fit for the C5-6 disc space. The transgraft permanent lordotic allograft implant was then brought onto the field. It was then packed with local bone graft that had been harvested previously. The depth of the interspace measured at nearly 20 mm. The implant was then carefully placed over the intervertebral disc space at C5-6 level. Care taken to ensure that no bone or soft tissue debris within the disc space that could be retropulsed with insertion of the cage  and bone graft. The implant was then impacted into place with the head placed in longitudinal cervical traction.  The implant was felt to be in excellent position alignment. Cervical distraction instrumentation was removed and the screw post holes coated with wax for hemostasis. Length of the plate was then chosen using bone wax applied to a cottonoid string and measuring from the edge of the lower cervical vertebral border of C5 the upper cervical border of C6  A  14 millimeter plate was chosen. 14 mm screws were then placed into the C5  locking the plate in place. Additional 14 mm screws were then placed in the C6 level  cervical traction had been released prior to screw placement. Intraoperative lateral radiograph demonstrated the plates and screws in good position alignment no sign of impingement upon the cervical canal. The graft appeared in good position alignment.  Upper extremity longitudinal traction using wrist restraints was necessary to obtain visualization of the lower cervical level.  This point irrigation was carried out at cervical incision site.The esophagus examined at the cervical level  and found to be normal. Irrigation was again carried out there was no active bleeding present. A 7 Fr TLS drain placed over the anterior cervical spine exiting below the incision sewn in place with a 2-0 nylon suture.The incisions were then closed by first reapproximating the omohyoid with a 2-0 vicryl suture the approximating the deep subcutaneous layers  the platysma layer with interrupted 3-0 Vicryl suture and the superficial fascia overlying the sternocleidomastoid muscle with interrupted 3-0 Vicryl sutures. The subcutaneous layers were approximated with interrupted 3-0 Vicryl sutures as were the superficial layers. The skin was closed with a running subcutaneous stitch of 4-0 Vicryl at the operative C5-6 transverse incision site. Skin was approximated with Dermabond. Mepilex bandage was applied. A  [place] collar was then applied to the cervical spine released on the cervical spine and drapes were removed. Physician assistant's responsibilities:Charnelle Bergeman Ricard Dillon, PA-C perform the duties of assistant physician and surgeon during this case present from the beginning of the case to the end of the case. He assisted with careful retraction of neural structures suctioning about her elements including cervical cord and C6 nerve root. Performed closure of the incision on the ligamentum nuchae to the skin and application of dressing. He assisted in positioning the patient had removal the patient from the OR table to her stretcher.     Basil Dess 05/25/2020, 4:54 PM

## 2020-05-25 NOTE — Brief Op Note (Signed)
05/25/2020  4:34 PM  PATIENT:  Monica Huang  39 y.o. female  PRE-OPERATIVE DIAGNOSIS:  Cervical five through cervical six herniated nucleus pulposus with myelopathy  POST-OPERATIVE DIAGNOSIS:  Cervical five through cervical six herniated nucleus pulposus with myelopathy  PROCEDURE:  Procedure(s): ANTERIOR CERVICAL DISCECTOMY AND FUSION CERVICAL FIVE THROUGH CERVICAL SIX WITH PLATES, SCREWS, ALLOGRAFT AND LOCAL BONE GRAFT, VIVIGEN (N/A)  SURGEON:  Surgeon(s) and Role:    * Kerrin Champagne, MD - Primary  PHYSICIAN ASSISTANT: Zonia Kief, PA-C  ANESTHESIA:   local and general, Dr. Armond Hang  EBL:  100 mL   BLOOD ADMINISTERED:none  DRAINS: Urinary Catheter (Foley) and (7 Fr) TLS Drain(s) to suction in the anterior cervical spine   LOCAL MEDICATIONS USED:  MARCAINE 0.5%  Amount: 10 ml  SPECIMEN:  No Specimen  DISPOSITION OF SPECIMEN:  N/A  COUNTS:  YES  TOURNIQUET:  * No tourniquets in log *  DICTATION: .Dragon Dictation  PLAN OF CARE: Admit for overnight observation  PATIENT DISPOSITION:  PACU - hemodynamically stable.   Delay start of Pharmacological VTE agent (>24hrs) due to surgical blood loss or risk of bleeding: yes

## 2020-05-25 NOTE — Interval H&P Note (Signed)
History and Physical Interval Note:  05/25/2020 12:56 PM  Monica Huang  has presented today for surgery, with the diagnosis of C5-6 herniated nucleus pulposus with myelopathy.  The various methods of treatment have been discussed with the patient and family. After consideration of risks, benefits and other options for treatment, the patient has consented to  Procedure(s): ANTERIOR CERVICAL DISCECTOMY AND FUSION C5-6 WITH PLATES, SCREWS, ALLOGRAFT AND LOCAL BONE GRAFT, VIVIGEN (N/A) as a surgical intervention.  The patient's history has been reviewed, patient examined, no change in status, stable for surgery.  I have reviewed the patient's chart and labs.  Questions were answered to the patient's satisfaction.     Vira Browns

## 2020-05-26 ENCOUNTER — Encounter (HOSPITAL_COMMUNITY): Payer: Self-pay | Admitting: Specialist

## 2020-05-26 DIAGNOSIS — H548 Legal blindness, as defined in USA: Secondary | ICD-10-CM | POA: Diagnosis present

## 2020-05-26 DIAGNOSIS — Z6841 Body Mass Index (BMI) 40.0 and over, adult: Secondary | ICD-10-CM | POA: Diagnosis not present

## 2020-05-26 DIAGNOSIS — E119 Type 2 diabetes mellitus without complications: Secondary | ICD-10-CM | POA: Diagnosis present

## 2020-05-26 DIAGNOSIS — F17210 Nicotine dependence, cigarettes, uncomplicated: Secondary | ICD-10-CM | POA: Diagnosis present

## 2020-05-26 DIAGNOSIS — Z833 Family history of diabetes mellitus: Secondary | ICD-10-CM | POA: Diagnosis not present

## 2020-05-26 DIAGNOSIS — M50122 Cervical disc disorder at C5-C6 level with radiculopathy: Secondary | ICD-10-CM | POA: Diagnosis present

## 2020-05-26 DIAGNOSIS — Z982 Presence of cerebrospinal fluid drainage device: Secondary | ICD-10-CM | POA: Diagnosis not present

## 2020-05-26 DIAGNOSIS — M50022 Cervical disc disorder at C5-C6 level with myelopathy: Secondary | ICD-10-CM | POA: Diagnosis present

## 2020-05-26 DIAGNOSIS — Z8249 Family history of ischemic heart disease and other diseases of the circulatory system: Secondary | ICD-10-CM | POA: Diagnosis not present

## 2020-05-26 DIAGNOSIS — Z981 Arthrodesis status: Secondary | ICD-10-CM

## 2020-05-26 LAB — CBC
HCT: 40.5 % (ref 36.0–46.0)
Hemoglobin: 13.5 g/dL (ref 12.0–15.0)
MCH: 32 pg (ref 26.0–34.0)
MCHC: 33.3 g/dL (ref 30.0–36.0)
MCV: 96 fL (ref 80.0–100.0)
Platelets: 323 10*3/uL (ref 150–400)
RBC: 4.22 MIL/uL (ref 3.87–5.11)
RDW: 13.6 % (ref 11.5–15.5)
WBC: 19.4 10*3/uL — ABNORMAL HIGH (ref 4.0–10.5)
nRBC: 0 % (ref 0.0–0.2)

## 2020-05-26 LAB — BASIC METABOLIC PANEL
Anion gap: 9 (ref 5–15)
BUN: 9 mg/dL (ref 6–20)
CO2: 22 mmol/L (ref 22–32)
Calcium: 8.7 mg/dL — ABNORMAL LOW (ref 8.9–10.3)
Chloride: 104 mmol/L (ref 98–111)
Creatinine, Ser: 0.77 mg/dL (ref 0.44–1.00)
GFR calc Af Amer: >60 mL/min (ref >60)
GFR calc non Af Amer: >60 mL/min (ref >60)
Glucose, Bld: 203 mg/dL — ABNORMAL HIGH (ref 70–99)
Potassium: 4.2 mmol/L (ref 3.5–5.1)
Sodium: 135 mmol/L (ref 135–145)

## 2020-05-26 MED ORDER — DIPHENHYDRAMINE HCL 25 MG PO CAPS
25.0000 mg | ORAL_CAPSULE | Freq: Four times a day (QID) | ORAL | Status: DC | PRN
  Filled 2020-05-26: qty 1

## 2020-05-26 MED ORDER — PANTOPRAZOLE SODIUM 40 MG PO TBEC
40.0000 mg | DELAYED_RELEASE_TABLET | Freq: Every day | ORAL | Status: DC
  Administered 2020-05-26: 40 mg via ORAL
  Filled 2020-05-26: qty 1

## 2020-05-26 MED FILL — Thrombin (Recombinant) For Soln 20000 Unit: CUTANEOUS | Qty: 1 | Status: AC

## 2020-05-26 NOTE — Progress Notes (Signed)
Continues to be distressed about the numbness/tingling in her right leg- she's not having any difficulty moving the extremity- warm with strong DP pulse-- notified on call phys no new orders continue to monitor

## 2020-05-26 NOTE — Progress Notes (Signed)
     Subjective: 1 Day Post-Op Procedure(s) (LRB): ANTERIOR CERVICAL DISCECTOMY AND FUSION CERVICAL FIVE THROUGH CERVICAL SIX WITH PLATES, SCREWS, ALLOGRAFT AND LOCAL BONE GRAFT, VIVIGEN (N/A) No complaints, PT saw and worked with patients no concerns and felt than She could be released today but continues to drain from TLS filling nearly another red top tube.   Patient reports pain as moderate.    Objective:   VITALS:  Temp:  [97.6 F (36.4 C)-98.6 F (37 C)] 98.2 F (36.8 C) (06/30 0849) Pulse Rate:  [66-85] 66 (06/30 0849) Resp:  [14-25] 17 (06/30 0849) BP: (90-151)/(66-83) 135/83 (06/30 0849) SpO2:  [88 %-100 %] 99 % (06/30 0849) Weight:  [144.5 kg] 144.5 kg (06/30 0308)  Neurologically intact ABD soft Neurovascular intact Sensation intact distally Intact pulses distally Dorsiflexion/Plantar flexion intact Incision: moderate drainage and Red top tube is over 1/2 filled, was not changed last evening so that she is still draining from anterior cervical discectomy and fusion site. TLS remains in place.   LABS Recent Labs    05/26/20 0822  HGB 13.5  WBC 19.4*  PLT 323   Recent Labs    05/26/20 0822  NA 135  K 4.2  CL 104  CO2 22  BUN 9  CREATININE 0.77  GLUCOSE 203*   No results for input(s): LABPT, INR in the last 72 hours.   Assessment/Plan: 1 Day Post-Op Procedure(s) (LRB): ANTERIOR CERVICAL DISCECTOMY AND FUSION CERVICAL FIVE THROUGH CERVICAL SIX WITH PLATES, SCREWS, ALLOGRAFT AND LOCAL BONE GRAFT, VIVIGEN (N/A)  Advance diet Up with therapy D/C IV fluids Plan for discharge tomorrow Discharge home with home health  Drain is still functioning and she has need to remain with the TLS drain inplace. I will see in the AM 05/27/2020 and plan for discontinuing the drain then and discharge if she is stable.   Vira Browns 05/26/2020, 1:54 PMPatient ID: Katherina Mires, female   DOB: Jul 01, 1981, 39 y.o.   MRN: [id number]

## 2020-05-26 NOTE — Care Management Obs Status (Signed)
MEDICARE OBSERVATION STATUS NOTIFICATION   Patient Details  Name: Monica Huang MRN: [id number] Date of Birth: 1981-04-20   Medicare Observation Status Notification Given:  Yes    Janae Bridgeman, RN 05/26/2020, 9:20 AM

## 2020-05-26 NOTE — Evaluation (Signed)
Occupational Therapy Evaluation Patient Details Name: Monica Huang MRN: [id number] DOB: [birth year] Today's Date: 05/26/2020    History of Present Illness 39 y.o. female presented 05/25/20 with herninated disc and myelopathy of C5-C6. S/p ACDF C5-C6 05/25/20. PMH legally blind, HLD, chronic back pain, amb with cane, pseufotumor cerebri syndrome.   Clinical Impression   Prior to admission, patient was living with family and was independent with BADLs. Patient was ambulating with use of white cane (low vision cane) and was working outside of the home. Family was responsible for IADLs including driving and meal prep. Patient currently presenting near baseline level of function requiring Mod I to supervision A grossly for all BADLs, functional transfers, and mobility. Patient education provided on safety with self-care tasks while adhering to precautions with patient demonstrating understanding via teach back method. Patient does not require continued OT services at this time. No follow-up OT recommendations with patient to return home with supervision/assist from family.     Follow Up Recommendations  No OT follow up;Supervision - Intermittent    Equipment Recommendations  3 in 1 bedside commode    Recommendations for Other Services       Precautions / Restrictions Precautions Precautions: Cervical Required Braces or Orthoses: Cervical Brace Cervical Brace: Soft collar Restrictions Weight Bearing Restrictions: No      Mobility Bed Mobility Overal bed mobility: Modified Independent             General bed mobility comments: HOB raised, pt able raise HOB at home  Transfers Overall transfer level: Modified independent Equipment used: Rolling walker (2 wheeled)             General transfer comment: required cues for RW use and verbal cues for directions due to blindness    Balance Overall balance assessment: Needs assistance   Sitting balance-Leahy Scale: Normal                                      ADL either performed or assessed with clinical judgement   ADL Overall ADL's : Needs assistance/impaired     Grooming: Standing;Supervision/safety;Wash/dry face;Wash/dry hands (Cues for low vision) Grooming Details (indicate cue type and reason): Sink level. Cues for low vision only.          Upper Body Dressing : Modified independent;Sitting   Lower Body Dressing: Sit to/from stand;Supervision/safety Lower Body Dressing Details (indicate cue type and reason): Cues for adherence to cervical precautions and for low vision.  Toilet Transfer: RW;Gr[employer] secretary Details (indicate cue type and reason): To BSC over toilet with cues for proximity and for maintaining precautions.  Toileting- Clothing Manipulation and Hygiene: Sit to/from stand;Supervision/safety       Functional mobility during ADLs: Min guard;Rolling walker General ADL Comments: Cues for low vision and walker management.      Vision Baseline Vision/History: Legally blind Patient Visual Report: No change from baseline Additional Comments: Patient legally blind at baseline. Able to see shadows and can sometimes distinguish light vs. dark     Perception     Praxis      Pertinent Vitals/Pain Pain Assessment: No/denies pain     Hand Dominance Right   Extremity/Trunk Assessment Upper Extremity Assessment Upper Extremity Assessment: Defer to OT evaluation   Lower Extremity Assessment Lower Extremity Assessment: Overall WFL for tasks assessed;RLE deficits/detail RLE Deficits / Details: reported numbness and tingling in RLE   Cervical / Trunk Assessment  Cervical / Trunk Assessment: Other exceptions Cervical / Trunk Exceptions: s/p cervical sx.    Communication Communication Communication: No difficulties   Cognition Arousal/Alertness: Awake/alert Behavior During Therapy: WFL for tasks assessed/performed Overall Cognitive Status:  Within Functional Limits for tasks assessed                                     General Comments       Exercises     Shoulder Instructions      Home Living Family/patient expects to be discharged to:: Private residence Living Arrangements: Spouse/significant other (& nephew) Available Help at Discharge: Family;Available 24 hours/day Type of Home: Apartment Home Access: Stairs to enter [employer] of Steps: flight Entrance Stairs-Rails: Right Home Layout: One level     Bathroom Shower/Tub: Chief Strategy Officer: Standard Bathroom Accessibility: Yes   Home Equipment: Cane - single point;Grab bars - tub/shower          Prior Functioning/Environment Level of Independence: Independent with assistive device(s)        Comments: walks with white cane in community, works for industries of the [employer]        OT Problem List: Pain      OT Treatment/Interventions:      OT Goals(Current goals can be found in the care plan section) Acute Rehab OT Goals Patient Stated Goal: To return home.  OT Frequency:     Barriers to D/C:            Co-evaluation              AM-PAC OT "6 Clicks" Daily Activity     Outcome Measure Help from another person eating meals?: None Help from another person taking care of personal grooming?: None Help from another person toileting, which includes using toliet, bedpan, or urinal?: A Little Help from another person bathing (including washing, rinsing, drying)?: A Little Help from another person to put on and taking off regular upper body clothing?: None Help from another person to put on and taking off regular lower body clothing?: A Little 6 Click Score: 21   End of Session Equipment Utilized During Treatment: Gait belt;Rolling walker  Activity Tolerance: Patient tolerated treatment well Patient left: in chair;with call bell/phone within reach;with chair alarm set  OT Visit Diagnosis: Other  abnormalities of gait and mobility (R26.89);Pain                Time: 8921-1941 OT Time Calculation (min): 16 min Charges:  OT General Charges $OT Visit: 1 Visit OT Evaluation $OT Eval Low Complexity: 1 Low  [name] H. OTR/L Supplemental OT, Department of rehab services [phone]  [name] R H. 05/26/2020, 11:55 AM

## 2020-05-26 NOTE — TOC Initial Note (Signed)
Transition of Care [facility]) - Initial/Assessment Note    Patient Details  Name: Monica Huang MRN: [id number] Date of Birth: 1981/09/19  [facility] CM/SW Contact:    Curlene Labrum, RN Phone Number: 05/26/2020, 9:33 AM  Clinical Narrative:                 Case management met with the patient S/P anterior cervical surgery yesterday by Dr. Louanne Skye.  The patient complains of mild tingling in her right leg after the surgery and Dr. Louanne Skye is aware.  I'm waiting on PT evaluation for equipment needs or PT home health if needed.  Patient given observation medicare letter.  The patient currently uses a cane for ambulation and lives with [place], fiance at home.  Will continue to follow for discharge needs.  Expected Discharge Plan: Home/Self Care     Patient Goals and CMS Choice Patient states their goals for this hospitalization and ongoing recovery are:: I'm ready to feel better.  My right leg is tingling this morning - Dr. Louanne Skye aware. CMS Medicare.gov Compare Post Acute Care list provided to:: Patient Choice offered to / list presented to : Patient  Expected Discharge Plan and Services Expected Discharge Plan: Home/Self Care   Discharge Planning Services: CM Consult Post Acute Care Choice: Durable Medical Equipment Living arrangements for the past 2 months: Apartment                                      Prior Living Arrangements/Services Living arrangements for the past 2 months: Apartment Lives with:: Domestic Partner Patient language and need for interpreter reviewed:: Yes Do you feel safe going back to the place where you live?: Yes      Need for Family Participation in Patient Care: Yes (Comment) Care giver support system in place?: Yes (comment) Current home services: DME (uses a cane for mobilization prior to the surgery) Criminal Activity/Legal Involvement Pertinent to Current Situation/Hospitalization: No - Comment as needed  Activities of Daily  Living Home Assistive Devices/Equipment: Cane (specify quad or straight), Dentures (specify type), Grab bars in shower, Scales ADL Screening (condition at time of admission) Patient's cognitive ability adequate to safely complete daily activities?: No Is the patient deaf or have difficulty hearing?: No Does the patient have difficulty seeing, even when wearing glasses/contacts?: Yes Does the patient have difficulty concentrating, remembering, or making decisions?: No Patient able to express need for assistance with ADLs?: Yes Does the patient have difficulty dressing or bathing?: No Independently performs ADLs?: Yes (appropriate for developmental age) Does the patient have difficulty walking or climbing stairs?: No Weakness of Legs: Right Weakness of Arms/Hands: Left  Permission Sought/Granted Permission sought to share information with : Case Manager       Permission granted to share info w AGENCY: DME or home health if needed  Permission granted to share info w Relationship: husband     Emotional Assessment Appearance:: Appears stated age Attitude/Demeanor/Rapport: Engaged Affect (typically observed): Tearful/Crying Orientation: : Oriented to Self, Oriented to Place, Oriented to  Time, Oriented to Situation Alcohol / Substance Use: Not Applicable Psych Involvement: No (comment)  Admission diagnosis:  Status post cervical spinal fusion [Z98.1] Patient Active Problem List   Diagnosis Date Noted  . Herniation of cervical intervertebral disc with radiculopathy 05/25/2020    Class: Chronic  . Spondylosis without myelopathy or radiculopathy, cervical region 05/25/2020    Class: Chronic  .  Status post cervical spinal fusion 05/25/2020  . Suspected sleep apnea 03/11/2020  . Palpitations 12/23/2019  . Chronic pain of both knees 10/23/2019  . Neck and shoulder pain 07/04/2019  . Close exposure to COVID-19 virus 06/25/2019  . Left knee pain 06/10/2019  . Viral upper respiratory  infection 05/22/2019  . Cervical radiculitis 03/10/2019  . Prediabetes 02/06/2019  . Carpal tunnel syndrome 02/03/2019  . Vaginal discharge 07/05/2016  . Anxiety and depression 06/10/2016  . Other optic atrophy, 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  . Essential hypertension, benign 05/10/2015  . Dizziness 09/30/2014  . Mechanical complication-ventricular(CSF) communicating shunt ([place]) 03/05/2013  . Hidradenitis suppurativa 10/15/2012  . Right hand pain 02/13/2012  . Tobacco abuse 01/03/2012  . Legally blind 01/03/2012  . Obesity 01/03/2012  . Marijuana smoker 01/03/2012   PCP:  [name], DO Pharmacy:   [id number] [place] [place]Phone: [phone]: [phone]    Social Determinants of Health (SDOH) Interventions    Readmission Risk Interventions No flowsheet data found.

## 2020-05-26 NOTE — Progress Notes (Signed)
Distressed by numbness and tingling to right lower extremity not relieved by pain med- nor by gabapentin- attempted notifying on call phys

## 2020-05-26 NOTE — Progress Notes (Signed)
     Subjective: 1 Day Post-Op Procedure(s) (LRB): ANTERIOR CERVICAL DISCECTOMY AND FUSION CERVICAL FIVE THROUGH CERVICAL SIX WITH PLATES, SCREWS, ALLOGRAFT AND LOCAL BONE GRAFT, VIVIGEN (N/A) Awake, alert and oriented x 4. Pain in excess of expected. Discussed with her and she starts laughing inappropriately. She complains of right leg numbness, intraop studies were not significant in the arms. Some right leg changes during the entire.case beginning to end suggests a preexisting right leg neuropathy. She has been in bed mainly and spent much of the time in the PACU crying, improved with versed. Is ordered ativan. Foley to be removed today. The TLS drain tube was not changed and is full.   Patient reports pain as marked.    Objective:   VITALS:  Temp:  [97.6 F (36.4 C)-98.6 F (37 C)] 98.2 F (36.8 C) (06/30 0849) Pulse Rate:  [66-85] 66 (06/30 0849) Resp:  [14-25] 17 (06/30 0849) BP: (90-151)/(66-83) 135/83 (06/30 0849) SpO2:  [88 %-100 %] 99 % (06/30 0849) Weight:  [137.4 kg-144.5 kg] 144.5 kg (06/30 0308)  Neurologically intact ABD soft Neurovascular intact Sensation intact distally Intact pulses distally Dorsiflexion/Plantar flexion intact Incision: scant drainage Compartment soft TLS redtop is full and changed today.   LABS Recent Labs    05/26/20 0822  HGB 13.5  WBC 19.4*  PLT 323   Recent Labs    05/26/20 0822  NA 135  K 4.2  CL 104  CO2 22  BUN 9  CREATININE 0.77  GLUCOSE 203*   No results for input(s): LABPT, INR in the last 72 hours.   Assessment/Plan: 1 Day Post-Op Procedure(s) (LRB): ANTERIOR CERVICAL DISCECTOMY AND FUSION CERVICAL FIVE THROUGH CERVICAL SIX WITH PLATES, SCREWS, ALLOGRAFT AND LOCAL BONE GRAFT, VIVIGEN (N/A)  Advance diet Up with therapy D/C IV fluids  She has unusual behaviior, but the rIght leg complaints may be a  Peripheral neuropathy as she says they are improved, it is possible  That the pain is a incomplete  Brown-Sequard pattern. I will have a neurology evaluation due to the history of previous brain surgery and  Indwelling VP shunt.   Vira Browns 05/26/2020, 9:36 AMPatient ID: Monica Huang, female   DOB: 07/23/1981, 39 y.o.   MRN: [id number]

## 2020-05-26 NOTE — Plan of Care (Signed)
°  Problem: Respiratory: °Goal: Ability to maintain a clear airway will improve °Outcome: Progressing °  °

## 2020-05-26 NOTE — Progress Notes (Signed)
Orthopedic Tech Progress Note Patient Details:  Monica Huang 1981/11/12 [id number] Patient has SOFT COLLAR Patient ID: Monica Huang, female   DOB: 03-26-1981, 39 y.o.   MRN: [id number]   Monica Huang 05/26/2020, 8:22 AM

## 2020-05-26 NOTE — Evaluation (Signed)
Physical Therapy Evaluation & Discharge Patient Details Name: Monica Huang MRN: [id number] DOB: July 02, 1981 Today's Date: 05/26/2020   History of Present Illness  39 y.o. female presented 05/25/20 with herninated disc and myelopathy of C5-C6. S/p ACDF C5-C6 05/25/20. PMH legally blind, HLD, chronic back pain, amb with cane, pseufotumor cerebri syndrome.  Clinical Impression  Pt presents with no major decrease in functional mobility secondary to above. PTA, pt lives with spouse and nephew in apartment with one flight of steps. Educ on precautions and safe bed mobility, pt demonstrated and verbalized understanding throughout the session. Today, pt able to complete all mobility mod(I) with RW and verbal cues for directions due to visual impairment. Pt able to complete x10 high knees with R UE support and min guard to mimic steps. Pt reported she will have the care she needs to get up and down steps and get into and out of bath tub at home. Pt no longer requires PT acutely.   Follow Up Recommendations No PT follow up    Equipment Recommendations  Rolling walker with 5" wheels    Recommendations for Other Services       Precautions / Restrictions Precautions Precautions: Cervical Required Braces or Orthoses: Cervical Brace Cervical Brace: Soft collar Restrictions Weight Bearing Restrictions: No      Mobility  Bed Mobility Overal bed mobility: Modified Independent             General bed mobility comments: HOB raised, pt able raise HOB at home  Transfers Overall transfer level: Modified independent Equipment used: Rolling walker (2 wheeled)             General transfer comment: required cues for RW use and verbal cues for directions due to blindness  Ambulation/Gait Ambulation/Gait assistance: Modified independent (Device/Increase time) Gait Distance (Feet): 130 Feet Assistive device: Rolling walker (2 wheeled) Gait Pattern/deviations: Step-through pattern;Wide base of  support     General Gait Details: Pt able to amb 130 feet wiout one standing rest break 65'. Pt completed mock stair climbing with high kneesx10 and one rail to R.  Stairs Stairs: Yes Stairs assistance: Min guard Stair Management: One rail Right   General stair comments: mimicked flight of steps by completing 10 high knees without RW at R rail  Wheelchair Mobility    Modified Rankin (Stroke Patients Only)       Balance Overall balance assessment: Needs assistance   Sitting balance-Leahy Scale: Normal       Standing balance-Leahy Scale: Poor Standing balance comment: required bilateral UE support to stand                             Pertinent Vitals/Pain Pain Assessment: No/denies pain    Home Living Family/patient expects to be discharged to:: Private residence Living Arrangements: Spouse/significant other (& nephew) Available Help at Discharge: Family;Available 24 hours/day Type of Home: Apartment Home Access: Stairs to enter Entrance Stairs-Rails: Right Entrance Stairs-Number of Steps: flight Home Layout: One level Home Equipment: Cane - single point;Grab bars - tub/shower      Prior Function Level of Independence: Independent with assistive device(s)         Comments: walks with white cane in community, works for [employer]     [employer]   Dominant Hand: Right    Extremity/Trunk Assessment   Upper Extremity Assessment Upper Extremity Assessment: Defer to OT evaluation    Lower Extremity Assessment Lower Extremity Assessment: Overall  WFL for tasks assessed;RLE deficits/detail RLE Deficits / Details: reported numbness and tingling in RLE    Cervical / Trunk Assessment Cervical / Trunk Assessment: Other exceptions Cervical / Trunk Exceptions: s/p cervical sx.   Communication   Communication: No difficulties  Cognition Arousal/Alertness: Awake/alert Behavior During Therapy: WFL for tasks  assessed/performed Overall Cognitive Status: Within Functional Limits for tasks assessed                                        General Comments General comments (skin integrity, edema, etc.): Pt able to recount and maintain precautions during session. VSS    Exercises     Assessment/Plan    PT Assessment Patent does not need any further PT services  PT Problem List         PT Treatment Interventions      PT Goals (Current goals can be found in the Care Plan section)  Acute Rehab PT Goals Patient Stated Goal: To return home. PT Goal Formulation: All assessment and education complete, DC therapy    Frequency     Barriers to discharge        Co-evaluation               AM-PAC PT "6 Clicks" Mobility  Outcome Measure Help needed turning from your back to your side while in a flat bed without using bedrails?: None Help needed moving from lying on your back to sitting on the side of a flat bed without using bedrails?: None Help needed moving to and from a bed to a chair (including a wheelchair)?: A Little Help needed standing up from a chair using your arms (e.g., wheelchair or bedside chair)?: A Little Help needed to walk in hospital room?: A Little Help needed climbing 3-5 steps with a railing? : A Little 6 Click Score: 20    End of Session Equipment Utilized During Treatment: Gait belt Activity Tolerance: Patient tolerated treatment well Patient left: in chair;with call bell/phone within reach;with chair alarm set Nurse Communication: Mobility status      Time: 1001-1034 PT Time Calculation (min) (ACUTE ONLY): 33 min   Charges:   PT Evaluation $PT Eval Moderate Complexity: 1 Mod PT Treatments $Gait Training: 8-22 mins       [employer] SPT 05/26/2020   Sanjuana Letters 05/26/2020, 12:33 PM

## 2020-05-26 NOTE — Progress Notes (Signed)
Nursing staff requested assistance with patient at 1800 this evening due to patient and patient's sister yelling and using profanity in hallways.  AD talked with patient and patient's sister, and they calmed and agreed to continue with care plan.  Nursing staff requested assistance from AD again at 2215 because sister not able to come back in to visit due to visiting hours ending at 8pm.  AD entered room with RN.  Patient yelling and cursing and sister on speaker phone loudly cursing at nursing staff.  Patient states that she is leaving if we don't allow her sister to come back in.  RN called Dr. Otelia Sergeant, who instructed RN to remove drain before patient leaves AMA.  Patient stumbling around room, and continues to yell and [place] profanity.  Patient demanding to walk out, and states "Francesca Oman can deal with it if I fall".  AD convinced patient to sit in wheelchair.  Sister could be heard telling patient to just stay so her drain could remain in place.  Patient continues to loudly use profanity, but eventually agreed to return to bed and allow RN to continue caring for her until morning.

## 2020-05-27 MED ORDER — HYDROCODONE-ACETAMINOPHEN 10-325 MG PO TABS: 2.0000 | ORAL_TABLET | ORAL | 0 refills | Status: DC | PRN

## 2020-05-27 MED ORDER — PHENOL 1.4 % MT LIQD: 1.0000 | OROMUCOSAL | 1 refills | Status: DC | PRN

## 2020-05-27 MED ORDER — METHOCARBAMOL 500 MG PO TABS: 500.0000 mg | ORAL_TABLET | Freq: Three times a day (TID) | ORAL | 1 refills | Status: DC | PRN

## 2020-05-27 NOTE — Progress Notes (Addendum)
Another pt meltdown regarding lack pt care claims she has called on call light several times-but no one that has been in the area has seen it come on- it been tested and noted to be working- I have also taped a small object over the button to make it easily palpable- the lack of which is the reason she wants to go AMA- along with fact no one is being allowed to come and stay with her- visitor policy has been attempted by several staff members including assistant manager to be explained to her- she continues to claim that she was granted permission by " executive management" for someone to stay with her.  In prep for her leaving iv was removed- notified on call as to the pts wish to leave- and to gain some advice in regards to the TSL drain- order to remove but unable to find staff educated on that type of drain or comfortable to remove- got her to change her mind and stay. I also reassured her that I would remain in view of her room- so that if she puts on the call light I would be on the look out for- she continues to be noncompliant in calling for assistance to the restroom

## 2020-05-27 NOTE — TOC Transition Note (Signed)
Transition of Care [facility]) - CM/SW Discharge Note   Patient Details  Name: Monica Huang MRN: [id number] Date of Birth: 1981/07/20  [facility] CM/SW Contact:  Curlene Labrum, RN Phone Number: 05/27/2020, 10:10 AM   Clinical Narrative:     Case management met with the patient for discharge this morning.  I bariatric rolling walker and 3:1 will be delivered to her room for discharge home.  A friend is driving her home.  Final next level of care: Home/Self Care     Patient Goals and CMS Choice Patient states their goals for this hospitalization and ongoing recovery are:: I'm ready to feel better.  My right leg is tingling this morning - Dr. Louanne Skye aware. CMS Medicare.gov Compare Post Acute Care list provided to:: Patient Choice offered to / list presented to : Patient  Discharge Placement                       Discharge Plan and Services   Discharge Planning Services: CM Consult Post Acute Care Choice: Durable Medical Equipment                               Social Determinants of Health (SDOH) Interventions     Readmission Risk Interventions No flowsheet data found.

## 2020-05-27 NOTE — Progress Notes (Signed)
     Subjective: 2 Days Post-Op Procedure(s) (LRB): ANTERIOR CERVICAL DISCECTOMY AND FUSION CERVICAL FIVE THROUGH CERVICAL SIX WITH PLATES, SCREWS, ALLOGRAFT AND LOCAL BONE GRAFT, VIVIGEN (N/A) Almost no drainage today TLS. Drain removed and suture discontinued, new dressing. Pain and numbness right leg is better. No bowel or bladder difficulty. PT discharged yesterday as she was doing well.   Patient reports pain as moderate.    Objective:   VITALS:  Temp:  [98.1 F (36.7 C)-98.4 F (36.9 C)] 98.4 F (36.9 C) (07/01 0518) Pulse Rate:  [65-88] 70 (07/01 0518) Resp:  [17] 17 (07/01 0518) BP: (117-127)/(72-80) 127/80 (07/01 0518) SpO2:  [96 %-97 %] 97 % (07/01 0518)  Neurologically intact ABD soft Neurovascular intact Sensation intact distally Intact pulses distally Dorsiflexion/Plantar flexion intact Incision: no drainage No cellulitis present Compartment soft   LABS Recent Labs    05/26/20 0822  HGB 13.5  WBC 19.4*  PLT 323   Recent Labs    05/26/20 0822  NA 135  K 4.2  CL 104  CO2 22  BUN 9  CREATININE 0.77  GLUCOSE 203*   No results for input(s): LABPT, INR in the last 72 hours.   Assessment/Plan: 2 Days Post-Op Procedure(s) (LRB): ANTERIOR CERVICAL DISCECTOMY AND FUSION CERVICAL FIVE THROUGH CERVICAL SIX WITH PLATES, SCREWS, ALLOGRAFT AND LOCAL BONE GRAFT, VIVIGEN (N/A)  Up with therapy Discharge home with home health  Vira Browns 05/27/2020, 8:52 AMPatient ID: Monica Huang, female   DOB: [birth year], 39 y.o.   MRN: [id number]

## 2020-05-27 NOTE — Progress Notes (Signed)
Discharge paperwork and instructions given to pt. Pt not in distress and tolerated well. 

## 2020-06-01 NOTE — Progress Notes (Signed)
    SUBJECTIVE:   CHIEF COMPLAINT / HPI: Vaginal itching  Patient reports having vaginal itching since July 3.  She was put on antibiotics for prophylaxis for surgery herniated disc on 06/29.  Prior to this she was on a steroid pack but had not taken them correctly.  She reports that she felt her blood sugars were elevated although she has does not check sugars at home.  This prompted her to seek medical attention as she has had similar symptoms like this in the past.  Denies any fevers, dysuria, frequency or urgency.  Reports no vaginal discharge but does endorse dyspareunia.  LMP 7/8, regular periods and currently on no birth control but is sexually active with husband.  Last HbA1c 6.1 (01/11)  PERTINENT  PMH / PSH:  Diabetes type 2, diet-controlled OBJECTIVE:   BP 130/84   Pulse 94   Ht 5\' 8"  (1.727 m)   Wt 299 lb 3.2 oz (135.7 kg)   LMP 05/04/2020 (Approximate)   SpO2 98%   BMI 45.49 kg/m    SVE: Cervix normal, normal vaginal walls, moderate amount of white cottage cheese appearing vaginal discharge Bimanual: No CMT or adnexal masses palpable  ASSESSMENT/PLAN:   Vaginal itching Vaginal itching likely secondary to candidal infection. -Urine prick test negative -Wet mount positive for yeast and BV -Diflucan 150 mg p.o. x 1  -Metronidazole 500 mg p.o. twice daily x7/7 -CBG 153 -Follow-up with PCP as needed     07/04/2020, MD [facility]

## 2020-06-01 NOTE — Patient Instructions (Addendum)
Thank you for coming to see me today. It was a pleasure.   Sent prescription for Diflucan for yeast infection Will let you know the results of swabs when results recieved  Please follow-up with PCP as needed  If you have any questions or concerns, please do not hesitate to call the office at [phone].  Best,   Dana Allan, MD [facility]

## 2020-06-01 NOTE — Discharge Summary (Signed)
Patient ID: Monica Huang MRN: [id number] DOB/AGE: [birth year] 39 y.o.  Admit date: 05/25/2020 Discharge date: 05/27/2020 Admission Diagnoses:  Principal Problem:   Herniation of cervical intervertebral disc with radiculopathy Active Problems:   Spondylosis without myelopathy or radiculopathy, cervical region   Status post cervical spinal fusion   S/P cervical spinal fusion   Discharge Diagnoses:  Principal Problem:   Herniation of cervical intervertebral disc with radiculopathy Active Problems:   Spondylosis without myelopathy or radiculopathy, cervical region   Status post cervical spinal fusion   S/P cervical spinal fusion  status post Procedure(s): ANTERIOR CERVICAL DISCECTOMY AND FUSION CERVICAL FIVE THROUGH CERVICAL SIX WITH PLATES, SCREWS, ALLOGRAFT AND LOCAL BONE GRAFT, VIVIGEN  Past Medical History:  Diagnosis Date  . Ambulates with cane   . Chronic back pain   . Chronic leg pain   . Diabetes mellitus without complication (HCC)    Type II - no meds  . Dizziness   . Hydradenitis   . Hyperlipidemia    diet controlle - no meds  . Legally blind    uses cane - some limited vision  . Macular degeneration    patient denies this dx  (dr street dx)  . Pneumonia    x 1  . Pseudotumor cerebri syndrome 2005   shunt placed- and legally blind  . Sciatica   . Sleep apnea    "Mild" - has appt on 06/23/20 to see about cpap machine  . Wears partial dentures    upper and lower    Surgeries: Procedure(s): ANTERIOR CERVICAL DISCECTOMY AND FUSION CERVICAL FIVE THROUGH CERVICAL SIX WITH PLATES, SCREWS, ALLOGRAFT AND LOCAL BONE GRAFT, VIVIGEN on 05/25/2020   Consultants: Treatment Team:  Kerrin Champagne, MD  Discharged Condition: Improved  Hospital Course: Monica Huang is an 39 y.o. female who was admitted 05/25/2020 for operative treatment of Herniation of cervical intervertebral disc with radiculopathy. Patient failed conservative treatments (please see the history and  physical for the specifics) and had severe unremitting pain that affects sleep, daily activities and work/hobbies. After pre-op clearance, the patient was taken to the operating room on 05/25/2020 and underwent  Procedure(s): ANTERIOR CERVICAL DISCECTOMY AND FUSION CERVICAL FIVE THROUGH CERVICAL SIX WITH PLATES, SCREWS, ALLOGRAFT AND LOCAL BONE GRAFT, VIVIGEN.    Patient was given perioperative antibiotics:  Anti-infectives (From admission, onward)   Start     Dose/Rate Route Frequency Ordered Stop   05/25/20 2130  ceFAZolin (ANCEF) IVPB 2g/100 mL premix        2 g 200 mL/hr over 30 Minutes Intravenous Every 8 hours 05/25/20 1836 05/26/20 0531   05/25/20 1845  tetracycline (SUMYCIN) capsule 500 mg  Status:  Discontinued        500 mg Oral Daily 05/25/20 1836 05/27/20 1635   05/25/20 0600  ceFAZolin (ANCEF) 3 g in dextrose 5 % 50 mL IVPB        3 g 100 mL/hr over 30 Minutes Intravenous On call to O.R. 05/24/20 0742 05/25/20 1345       Patient was given sequential compression devices and early ambulation to prevent DVT.   Patient benefited maximally from hospital stay and there were no complications. At the time of discharge, the patient was urinating/moving their bowels without difficulty, tolerating a regular diet, pain is controlled with oral pain medications and they have been cleared by PT/OT.   Recent vital signs: No data found.   Recent laboratory studies: No results for input(s): WBC, HGB, HCT, PLT, NA, K, CL, CO2,  BUN, CREATININE, GLUCOSE, INR, CALCIUM in the last 72 hours.  Invalid input(s): PT, 2   Discharge Medications:   Allergies as of 05/27/2020   No Known Allergies     Medication List    STOP taking these medications   HYDROcodone-acetaminophen 5-325 MG tablet Commonly known as: NORCO/VICODIN Replaced by: HYDROcodone-acetaminophen 10-325 MG tablet   methylPREDNISolone 4 MG Tbpk tablet Commonly known as: MEDROL DOSEPAK     TAKE these medications   clindamycin 1  % gel Commonly known as: Clindagel Apply topically 2 (two) times daily. What changed:   how much to take  when to take this  reasons to take this   gabapentin 300 MG capsule Commonly known as: NEURONTIN Take 1 capsule (300 mg total) by mouth at bedtime.   HYDROcodone-acetaminophen 10-325 MG tablet Commonly known as: NORCO Take 2 tablets by mouth every 4 (four) hours as needed for severe pain ((score 7 to 10)). Replaces: HYDROcodone-acetaminophen 5-325 MG tablet   methocarbamol 500 MG tablet Commonly known as: ROBAXIN Take 1 tablet (500 mg total) by mouth every 8 (eight) hours as needed for muscle spasms.   phenol 1.4 % Liqd Commonly known as: CHLORASEPTIC Use as directed 1 spray in the mouth or throat as needed for throat irritation / pain.   tetracycline 500 MG capsule Commonly known as: SUMYCIN Take 500 mg by mouth daily.       Diagnostic Studies: DG Cervical Spine 1 View  Result Date: 05/25/2020 CLINICAL DATA:  Surgery, elective. Additional history provided: ACDF C5-C6. Provided fluoroscopy time 17 seconds, 5.85 mGy EXAM: DG CERVICAL SPINE - 1 VIEW; DG C-ARM 1-60 MIN COMPARISON:  Cervical spine MRI 05/10/2020 FINDINGS: Three lateral view intraoperative fluoroscopic images of the cervical spine are submitted. There is limited assessment of the lower cervical levels due to superimposition of the patient's shoulders. On the initial lateral view fluoroscopic image, a metallic site marker projects ventral to the C5-C6 level. On subsequent lateral view fluoroscopic images, there is ACDF hardware at the C5-C6 level. No unexpected finding. Partially imaged support tubes. IMPRESSION: Three lateral view intraoperative fluoroscopic images of the cervical spine, as described. Electronically Signed   By: Jackey Loge DO   On: 05/25/2020 16:58   MR Cervical Spine Wo Contrast  Result Date: 05/10/2020 CLINICAL DATA:  Neck pain. Left arm pain and right leg pain. Numbness and weakness in  left hand. EXAM: MRI CERVICAL SPINE WITHOUT CONTRAST TECHNIQUE: Multiplanar, multisequence MR imaging of the cervical spine was performed. No intravenous contrast was administered. COMPARISON:  Cervical spine radiographs 02/27/2019 FINDINGS: Alignment: Normal alignment with straightening of the cervical lordosis Vertebrae: Negative for fracture or mass.  Normal bone marrow. Cord: Cord evaluation is limited by motion. There is increased signal in the cord at C5-6 which appears to be related to spinal stenosis. Posterior Fossa, vertebral arteries, paraspinal tissues: Negative Disc levels: C2-3: Small central disc protrusion.  Negative for stenosis. C3-4: Small central disc protrusion. Mild cord flattening and mild spinal stenosis. C4-5: Shallow central disc protrusion. Mild facet hypertrophy. Mild spinal stenosis and moderate left foraminal encroachment due to spurring. C5-6: Moderate to large central disc protrusion with cord flattening and moderate spinal stenosis. Cord hyperintensity. Mild foraminal narrowing bilaterally C6-7: Right paracentral disc protrusion with cord flattening on the right. No significant spinal stenosis. C7-T1: Negative IMPRESSION: Multilevel degenerative change in the cervical spine as above. This is most prominent at C5-6 where there is a central disc protrusion causing moderate spinal stenosis and cord hyperintensity consistent  with compressive myelopathy. Electronically Signed   By: Marlan Palau M.D.   On: 05/10/2020 21:47   DG C-Arm 1-60 Min  Result Date: 05/25/2020 CLINICAL DATA:  Surgery, elective. Additional history provided: ACDF C5-C6. Provided fluoroscopy time 17 seconds, 5.85 mGy EXAM: DG CERVICAL SPINE - 1 VIEW; DG C-ARM 1-60 MIN COMPARISON:  Cervical spine MRI 05/10/2020 FINDINGS: Three lateral view intraoperative fluoroscopic images of the cervical spine are submitted. There is limited assessment of the lower cervical levels due to superimposition of the patient's  shoulders. On the initial lateral view fluoroscopic image, a metallic site marker projects ventral to the C5-C6 level. On subsequent lateral view fluoroscopic images, there is ACDF hardware at the C5-C6 level. No unexpected finding. Partially imaged support tubes. IMPRESSION: Three lateral view intraoperative fluoroscopic images of the cervical spine, as described. Electronically Signed   By: Jackey Loge DO   On: 05/25/2020 16:58    Discharge Instructions    Call MD / Call 911   Complete by: As directed    If you experience chest pain or shortness of breath, CALL 911 and be transported to the hospital emergency room.  If you develope a fever above 101 F, pus (white drainage) or increased drainage or redness at the wound, or calf pain, call your surgeon's office.   Constipation Prevention   Complete by: As directed    Drink plenty of fluids.  Prune juice may be helpful.  You may use a stool softener, such as Colace (over the counter) 100 mg twice a day.  Use MiraLax (over the counter) for constipation as needed.   Diet - low sodium heart healthy   Complete by: As directed    Discharge instructions   Complete by: As directed    No lifting greater than 10 lbs. No overhead use of arms. Avoid bending,and twisting neck. Walk in house for first week them may start to get out slowly increasing distance up to one mile by 3 weeks post op. Keep incision dry for 3 days, may then bathe and wet incision using a [place] collar when showering. Call if any fevers >101, chills, or increasing numbness or weakness or increased swelling or drainage.   Driving restrictions   Complete by: As directed    No driving due to history of blindness   Increase activity slowly as tolerated   Complete by: As directed    Lifting restrictions   Complete by: As directed    No lifting for 8 weeks       Follow-up Information    Kerrin Champagne, MD In 2 weeks.   Specialty: Orthopedic Surgery Why: For wound  re-check Contact information: [address] [place] [address] [phone]        [facility] Follow up.   Why: You received a rolling walker and 3:1 chair prior to your discharge home today. Contact information: [address] [place] [address] [phone]               Discharge Plan:  discharge to home  Disposition:     Signed: Zonia Kief  06/01/2020, 10:21 AM

## 2020-06-02 ENCOUNTER — Ambulatory Visit (INDEPENDENT_AMBULATORY_CARE_PROVIDER_SITE_OTHER): Payer: Medicare Other | Admitting: Family Medicine

## 2020-06-02 ENCOUNTER — Other Ambulatory Visit: Payer: Self-pay

## 2020-06-02 VITALS — BP 130/84 | HR 94 | Ht 68.0 in | Wt 299.2 lb

## 2020-06-02 DIAGNOSIS — N898 Other specified noninflammatory disorders of vagina: Secondary | ICD-10-CM

## 2020-06-02 DIAGNOSIS — R7309 Other abnormal glucose: Secondary | ICD-10-CM

## 2020-06-02 DIAGNOSIS — B379 Candidiasis, unspecified: Secondary | ICD-10-CM | POA: Diagnosis not present

## 2020-06-02 LAB — POCT WET PREP (WET MOUNT)
Clue Cells Wet Prep Whiff POC: POSITIVE
Trichomonas Wet Prep HPF POC: ABSENT

## 2020-06-02 LAB — GLUCOSE, POCT (MANUAL RESULT ENTRY): POC Glucose: 153 mg/dl — AB (ref 70–99)

## 2020-06-02 MED ORDER — METRONIDAZOLE 500 MG PO TABS: 500.0000 mg | ORAL_TABLET | Freq: Two times a day (BID) | ORAL | 0 refills | Status: DC

## 2020-06-02 MED ORDER — FLUCONAZOLE 150 MG PO TABS
150.0000 mg | ORAL_TABLET | Freq: Once | ORAL | 0 refills | Status: AC
Start: 2020-06-02 — End: 2020-06-02

## 2020-06-05 ENCOUNTER — Encounter: Payer: Self-pay | Admitting: Family Medicine

## 2020-06-05 DIAGNOSIS — N898 Other specified noninflammatory disorders of vagina: Secondary | ICD-10-CM | POA: Insufficient documentation

## 2020-06-05 NOTE — Assessment & Plan Note (Signed)
Vaginal itching likely secondary to candidal infection. -Urine prick test negative -Wet mount positive for yeast and BV -Diflucan 150 mg p.o. x 1  -Metronidazole 500 mg p.o. twice daily x7/7 -CBG 153 -Follow-up with PCP as needed

## 2020-06-07 ENCOUNTER — Telehealth: Payer: Self-pay | Admitting: Specialist

## 2020-06-07 NOTE — Telephone Encounter (Signed)
Patient called asked for a call back concerning her disability. Patient said [employer] said they do not have any paperwork from Dr. Barbaraann Faster office. They received paperwork from [facility]. Patient asked for a call back as soon as possible. The number to contact patient is [phone]

## 2020-06-07 NOTE — Telephone Encounter (Signed)
Ic,lmvm advised we have not received NEW forms. Forms were completed and faxed 7/1 having her oow through the end of September, so she shouldn't need new forms. Advised if for some reason there is to be new forms, we do not have them and advised her to contact insurance co to have them send.

## 2020-06-10 ENCOUNTER — Encounter: Payer: Self-pay | Admitting: Surgery

## 2020-06-10 ENCOUNTER — Ambulatory Visit: Payer: Self-pay

## 2020-06-10 ENCOUNTER — Ambulatory Visit (INDEPENDENT_AMBULATORY_CARE_PROVIDER_SITE_OTHER): Payer: Medicare Other | Admitting: Surgery

## 2020-06-10 VITALS — BP 133/92 | HR 95 | Ht 68.0 in | Wt 299.2 lb

## 2020-06-10 DIAGNOSIS — M5010 Cervical disc disorder with radiculopathy, unspecified cervical region: Secondary | ICD-10-CM

## 2020-06-10 MED ORDER — HYDROCODONE-ACETAMINOPHEN 10-325 MG PO TABS: 1.0000 | ORAL_TABLET | Freq: Four times a day (QID) | ORAL | 0 refills | Status: DC | PRN

## 2020-06-10 MED ORDER — GABAPENTIN 300 MG PO CAPS: 300.0000 mg | ORAL_CAPSULE | Freq: Every day | ORAL | 1 refills | Status: DC

## 2020-06-10 NOTE — Progress Notes (Signed)
39 year old black female who is 2 weeks status post C5-6 ACDF returns.  States that she is doing well.  Preop arm pain is improved.  Continues have some residual numbness and tingling.  States that she needs refills of Percocet and gabapentin.  Exam Pleasant black female alert and oriented in no acute distress.  Wound looks good.  No drainage or signs infection.  Steri-Strips changed.  Neurologically intact.  Plan Patient advised she must continue cervical collar until return office visit with Dr. Otelia Sergeant in 4 weeks for recheck.  He will likely get repeat x-rays at that time.  Discussed smoking cessation.  Refilled medication.

## 2020-06-15 ENCOUNTER — Telehealth: Payer: Self-pay | Admitting: Family Medicine

## 2020-06-15 NOTE — Telephone Encounter (Signed)
Pt called requesting information on the restriction for the short term disability.  Call the Pt. At [phone]

## 2020-06-18 NOTE — Telephone Encounter (Signed)
Patient called. Informed that paperwork was faxed 06/16/20. She voice understanding and gratitude.

## 2020-06-23 ENCOUNTER — Telehealth (INDEPENDENT_AMBULATORY_CARE_PROVIDER_SITE_OTHER): Payer: Medicare Other | Admitting: Cardiovascular Disease

## 2020-06-23 ENCOUNTER — Telehealth: Payer: Self-pay

## 2020-06-23 ENCOUNTER — Encounter: Payer: Self-pay | Admitting: Cardiovascular Disease

## 2020-06-23 VITALS — Ht 69.0 in | Wt 303.0 lb

## 2020-06-23 DIAGNOSIS — H548 Legal blindness, as defined in USA: Secondary | ICD-10-CM

## 2020-06-23 DIAGNOSIS — R42 Dizziness and giddiness: Secondary | ICD-10-CM | POA: Diagnosis not present

## 2020-06-23 DIAGNOSIS — G4733 Obstructive sleep apnea (adult) (pediatric): Secondary | ICD-10-CM | POA: Diagnosis not present

## 2020-06-23 DIAGNOSIS — R7303 Prediabetes: Secondary | ICD-10-CM | POA: Diagnosis not present

## 2020-06-23 NOTE — Progress Notes (Signed)
Virtual Visit via Telephone Note   This visit type was conducted due to national recommendations for restrictions regarding the COVID-19 Pandemic (e.g. social distancing) in an effort to limit this patient's exposure and mitigate transmission in our community.  Due to her co-morbid illnesses, this patient is at least at moderate risk for complications without adequate follow up.  This format is felt to be most appropriate for this patient at this time.  The patient did not have access to video technology/had technical difficulties with video requiring transitioning to audio format only (telephone).  All issues noted in this document were discussed and addressed.  No physical exam could be performed with this format.  Please refer to the patient's chart for her  consent to telehealth for [facility].    Date:  06/24/2020   ID:  Monica Huang, DOB Apr 20, 1981, MRN [id number] The patient was identified using 2 identifiers.  Patient Location: Home Provider Location: Office/Clinic  PCP:  Lavonda Jumbo, DO  Cardiologist:  Nicki Guadalajara, MD  Electrophysiologist:  None   Evaluation Performed:  Follow-Up Visit  Chief Complaint: 61-month follow-up evaluation  History of Present Illness:    Monica Huang is a 39 y.o. female with who has a history of blindness, diabetes mellitus, and hidradenitis who is referred through the courtesy of Dr. Parke Poisson for further evaluation of dizziness.  I initially saw her in October 2018.  Monica Huang is legally blind secondary to pseudotumor cerebri and is status post VP shunting.  In 2017 she was seen by Dr. Allyson Sabal for evaluation of presyncope.  She had a history of hypertension on diuretics as well as diabetes.  She was smoking 1 pack of cigarettes per day.  She denied chest pain or dyspnea.  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  She often felt dizzy when she would get up.  He did not feel her cardiac exam was significant and at that point  did not recommend further cardiac testing.  The patient states that she has had recurrent episodes of dizziness, particularly if she sits for long periods of time.  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  She denies any recent episodes of syncope.  She denies any episodes of chest pressure.  She has noticed lightheadedness.  At times she notes her heart rate increases.  She denies any awareness of significant arrhythmia.  She denies chest pressure.  She continues to smoke one half pack per day.  [facility] and because of her continued recurrent symptomatology and an ECG which suggested irregular bradycardia with potential dropped beats, most likely due to PACs.    She underwent an echo Doppler study which showed an EF of 60 to 65%, mild MR and mild TR.  A 48-hour Holter monitor showed an average rate of 88 bpm and predominant sinus rhythm.  There were episodes of sinus bradycardia with minimum heart rate of 41, and sinus tachycardia up to 144 bpm.  There was also an episode of winky block second-degree type I block.  There were 3 episodes of second-degree heart block with ventricular pauses greater than 2 seconds.  I have not seen her since her 2018 evaluation.    She was seen in the emergency room in January 2021 with palpitations.  She left AMA.  She was seen by Gillie Manners in March 2021 he continued to have episodes of presyncope about once every  other week. She underwent a Zio patch monitor in April 2021 which showed an average heart rate at 99 bpm with sinus rhythm with minimum sinus bradycardia 46 bpm with a maximum sinus tachycardia at 144 bpm.  Her slowest heart rate occurred at 25 bpm at 6:34 AM.  She had several episodes of second-degree Mobitz type I block, and one episode where her rate ranged from 37 to 60 bpm with another episode with rates ranging from 46 to 78 bpm.  There were isolated PACs.  There were no episodes  of atrial fibrillation.  Due to concerns for obstructive apnea she underwent a sleep study on March 22, 2020 which showed mild overall sleep apnea with AHI of 5.1 and RDI of 6.9/h.  However sleep apnea was moderate during REM sleep with anxiety 19/h.  Oxygen desaturation to 89%.  Presently, she feels well.  Her dizziness is better.  She typically goes to bed between 930 and 10 PM and wakes up around 5 AM when she works in 7 AM without work.  She had undergone herniated disc surgery of her neck by Dr. Louanne Skye, in June 2021.  Presently she denies chest pain or shortness of breath.  She is not sleeping well.  She is not yet had any CPAP titration will has been started on AutoPap therapy.  She presents for evaluation.   The patient does not have symptoms concerning for COVID-19 infection (fever, chills, cough, or new shortness of breath).    Past Medical History:  Diagnosis Date   Ambulates with cane    Chronic back pain    Chronic leg pain    Diabetes mellitus without complication (HCC)    Type II - no meds   Dizziness    Hydradenitis    Hyperlipidemia    diet controlle - no meds   Legally blind    uses cane - some limited vision   Macular degeneration    patient denies this dx  (dr street dx)   Pneumonia    x 1   Pseudotumor cerebri syndrome 2005   shunt placed- and legally blind   Sciatica    Sleep apnea    "Mild" - has appt on 06/23/20 to see about cpap machine   Wears partial dentures    upper and lower   Past Surgical History:  Procedure Laterality Date   ANTERIOR CERVICAL DECOMP/DISCECTOMY FUSION N/A 05/25/2020   Procedure: ANTERIOR CERVICAL DISCECTOMY AND FUSION CERVICAL FIVE THROUGH CERVICAL SIX WITH PLATES, SCREWS, ALLOGRAFT AND LOCAL BONE GRAFT, VIVIGEN;  Surgeon: Kerrin Champagne, MD;  Location: [facility] OR;  Service: Orthopedics;  Laterality: N/A;   CSF SHUNT     2 revisions   MULTIPLE EXTRACTIONS WITH ALVEOLOPLASTY Bilateral 11/02/2017   Procedure: MULTIPLE  EXTRACTION;  Surgeon: Ocie Doyne, DDS;  Location: [facility] OR;  Service: Oral Surgery;  Laterality: Bilateral;     Current Meds  Medication Sig   clindamycin (CLINDAGEL) 1 % gel Apply topically 2 (two) times daily. (Patient taking differently: Apply 1 application topically 2 (two) times daily as needed (rash/boils). )   gabapentin (NEURONTIN) 300 MG capsule Take 1 capsule (300 mg total) by mouth at bedtime.   HYDROcodone-acetaminophen (NORCO) 10-325 MG tablet Take 2 tablets by mouth every 4 (four) hours as needed for severe pain ((score 7 to 10)).   [DISCONTINUED] HYDROcodone-acetaminophen (NORCO) 10-325 MG tablet Take 1 tablet by mouth every 6 (six) hours as needed.   [DISCONTINUED] methocarbamol (ROBAXIN) 500 MG tablet Take 1 tablet (500  mg total) by mouth every 8 (eight) hours as needed for muscle spasms.   [DISCONTINUED] metroNIDAZOLE (FLAGYL) 500 MG tablet Take 1 tablet (500 mg total) by mouth 2 (two) times daily.   [DISCONTINUED] phenol (CHLORASEPTIC) 1.4 % LIQD Use as directed 1 spray in the mouth or throat as needed for throat irritation / pain.   [DISCONTINUED] tetracycline (SUMYCIN) 500 MG capsule Take 500 mg by mouth daily.     Allergies:   Patient has no known allergies.   Social History   Tobacco Use   Smoking status: Current Every Day Smoker    Packs/day: 0.50    Years: 21.00    Pack years: 10.50    Types: Cigarettes   Smokeless tobacco: Never Used  Vaping Use   Vaping Use: Never used  Substance Use Topics   Alcohol use: Yes    Comment: occasional   Drug use: Yes    Types: Marijuana    Comment: marijuana -- bag per day. - -4 joints/day     Family Hx: The patient's family history includes Diabetes in her father and mother; Hypertension in her mother.  ROS:   Please see the history of present illness.     Negative for weight loss. No fevers chills night sweats Morbid obesity Poor sleep No chest pain Occasional nocturnal palpitations. Resolution of  prior dizziness  All other systems reviewed and are negative.   Prior CV studies:   The following studies were reviewed today:  SLEEP STUDY: 03/22/2020 SLEEP ARCHITECTURE The study was initiated at 9:56:10 PM and ended at 3:59:43 AM.  Sleep onset time was 46.4 minutes and the sleep efficiency was 64.6%%. The total sleep time was 235 minutes.  Stage REM latency was 58.5 minutes.  The patient spent 6.8%% of the night in stage N1 sleep, 67.7%% in stage N2 sleep, 0.0%% in stage N3 and 25.5% in REM.  Alpha intrusion was absent.  Supine sleep was 66.81%.  RESPIRATORY PARAMETERS The overall apnea/hypopnea index (AHI) was 5.1 per hour.  The respiratory disturbancer index (RDI) was 6.9/h. There were 0 total apneas, including 0 obstructive, 0 central and 0 mixed apneas. There were 20 hypopneas and 7 RERAs.  The AHI during Stage REM sleep was 19.0 per hour.  AHI while supine was 3.8 per hour.  The mean oxygen saturation was 96.2%. The minimum SpO2 during sleep was 89.0%.  Loud noring in all positions was noted during this study.  CARDIAC DATA The 2 lead EKG demonstrated sinus rhythm. The mean heart rate was 82.2 beats per minute. Other EKG findings include: None.  LEG MOVEMENT DATA The total PLMS were 0 with a resulting PLMS index of 0.0. Associated arousal with leg movement index was 0.0 .  IMPRESSIONS - Mild obstructive sleep apnea occurred during this study (AHI 5.1/h; RDI 6.9/h); however, sleep apnea was moderate during REM sleep (AHI 19.0/h) - No significant central sleep apnea occurred during this study (CAI = 0.0/h). - The patient had minimal  desaturation during the study (Min O2 = 89.0%) - Snoring was audible during this study. - Reduced sleep efficiency at 64.6%. Wake after sleep onset (WASO) was 82.1 minutes. - No cardiac abnormalities were noted during this study. - Clinically significant periodic limb movements did not occur during sleep. No significant  associated arousals.  DIAGNOSIS - Obstructive Sleep Apnea (327.23 [G47.33 ICD-10]) - Nocturnal Hypoxemia (327.26 [G47.36 ICD-10])  RECOMMENDATIONS - In this patient with significant comorbidities including significant morbid obesity and moderate sleep apnea during REM sleep,  recommend CPAP therapy for optimal treatment of her sleep disordered breathing. With her blindness, recommend CPAP titration study. If unable to obtain, then consider Auto-PAP at 6 -16 cm of water. If patient is against CPAP can consider a customized oral appliance. - Effort should be made to optimize nasal and oropharyngeal patency. - Avoid alcohol, sedatives and other CNS depressants that may worsen sleep apnea and disrupt normal sleep architecture. - Sleep hygiene should be reviewed to assess factors that may improve sleep quality. - Weight management (BMI 46) and regular exercise should be initiated or continued if appropriate. - Sleep clinic evaluation if necessary to discuss options. If CPAP instituted recommend a download in 30 days and sleep clinic evaluation.   Labs/Other Tests and Data Reviewed:    EKG: I personally reviewed her last ECG from May 19, 2020 which showed normal sinus rhythm at 87, first-degree AV block with a PR interval of 244 ms, and left ventricular hypertrophy.  Recent Labs: 05/19/2020: ALT 19 05/26/2020: BUN 9; Creatinine, Ser 0.77; Hemoglobin 13.5; Platelets 323; Potassium 4.2; Sodium 135   Recent Lipid Panel Lab Results  Component Value Date/Time   CHOL 147 03/11/2015 08:41 AM   TRIG 106 03/11/2015 08:41 AM   HDL 45 (L) 03/11/2015 08:41 AM   CHOLHDL 3.3 03/11/2015 08:41 AM   LDLCALC 81 03/11/2015 08:41 AM   LDLDIRECT 100 (H) 01/01/2012 11:55 AM    Wt Readings from Last 3 Encounters:  06/23/20 (!) 303 lb (137.4 kg)  06/10/20 299 lb 3.2 oz (135.7 kg)  06/02/20 299 lb 3.2 oz (135.7 kg)     Objective:    Vital Signs:  Ht 5\' 9"  (1.753 m)    Wt (!) 303 lb (137.4 kg)    BMI  44.75 kg/m    Since this was a virtual visit I could not physically examine the patient. Breathing was normal and nonlabored There was no audible wheezing Heart rate was regular She denies any significant leg swelling There was no abdominal pain   ASSESSMENT & PLAN:    1. OSA (obstructive sleep apnea)   2. Dizziness   3. Prediabetes   4. Morbid obesity (HCC)   5. Legally blind    Monica Huang is a 39 year old morbidly obese female who has a history of prediabetes, hydrotinnitus, pseudotumor cerebri syndrome, blindness, hyperlipidemia, as well as cervical disc disease.  She was evaluated by me in 2018 and more recently in 2021 by 2022, PA.  In the past she has had episodes of dizziness and has been demonstrated to have episodes of bradycardia and transient heart block.  She was recently referred for a sleep study which confirmed obstructive sleep apnea with an AHI of 5.1 but episodes were moderate during REM sleep with an AHI of 19.0.  She has had difficulty with sleep but admits to snoring and at the time of her study loud snoring was identified.  I reviewed her sleep study in detail with her today.  I discussed the potential adverse consequences of untreated sleep apnea with reference to her cardiovascular health in detail.  She apparently has not had any follow-up since her diagnostic study.  Since her sleep apnea was mild overall, I have suggested AutoPap titration and attempt to expedite treatment rather than scheduling her for CPAP titration study in lab.  I will set her at initial pressure of 6 to 20 cm of water.  A download will be obtained 30 days after initiation of therapy, I will see her  back in the office.  Medicare mandates to assess compliance and adjustment can be made to her CPAP device at that time.  She continues to be morbidly obese with a BMI of 44.75.  Weight loss and increased exercise was recommended.  She is prediabetic/borderline diabetic with June 2021  hemoglobin A1c at 6.6.  She states her recent blood pressure has been 124/80 although this was not taken today.  I will see her in 2 months for follow-up evaluation or sooner as needed.  COVID-19 Education: The signs and symptoms of COVID-19 were discussed with the patient and how to seek care for testing (follow up with PCP or arrange E-visit).  The importance of social distancing was discussed today.  Time:   Today, I have spent 22 minutes with the patient with telehealth technology discussing the above problems.     Medication Adjustments/Labs and Tests Ordered: Current medicines are reviewed at length with the patient today.  Concerns regarding medicines are outlined above.   Tests Ordered: No orders of the defined types were placed in this encounter.   Medication Changes: No orders of the defined types were placed in this encounter.   Follow Up: In office visit in 2 months  Signed, Nicki Guadalajara, MD  06/24/2020 9:36 PM    [facility]

## 2020-06-23 NOTE — Telephone Encounter (Signed)
Needs a sooner appt if Dr.Kelly is placing himself in office the week of Sept 13th. Will wait for days to be added.

## 2020-06-23 NOTE — Patient Instructions (Addendum)
Medication Instructions:  CONTINUE WITH CURRENT MEDICATIONS. NO CHANGES.  *If you need a refill on your cardiac medications before your next appointment, please call your pharmacy*  Follow-Up: At [facility], you and your health needs are our priority.  As part of our continuing mission to provide you with exceptional heart care, we have created designated Provider Care Teams.  These Care Teams include your primary Cardiologist (physician) and Advanced Practice Providers (APPs -  Physician Assistants and Nurse Practitioners) who all work together to provide you with the care you need, when you need it.  We recommend signing up for the patient portal called "MyChart".  Sign up information is provided on this After Visit Summary.  MyChart is used to connect with patients for Virtual Visits (Telemedicine).  Patients are able to view lab/test results, encounter notes, upcoming appointments, etc.  Non-urgent messages can be sent to your provider as well.   To learn more about what you can do with MyChart, go to [email or web address].    Your next appointment:  OCT 18 at 12pm 2-3 month(s)  The format for your next appointment:   In Person  Provider:   Nicki Guadalajara, MD   Other Instructions Our sleep coordinator will call you to discuss DME company and autopap

## 2020-06-24 ENCOUNTER — Encounter: Payer: Self-pay | Admitting: Cardiovascular Disease

## 2020-06-25 ENCOUNTER — Telehealth: Payer: Self-pay | Admitting: *Deleted

## 2020-06-25 NOTE — Telephone Encounter (Signed)
CPAP auto orders faxed to [facility].

## 2020-06-25 NOTE — Telephone Encounter (Signed)
-----   Message from June Leap June, RN sent at 06/23/2020  8:57 AM EDT ----- Regarding: DME/ Autopap Pt needs to be set up with a DME company. Dr.Kelly would like her to start AutoPap- start at 6 to 20. Please assist. Thank you!

## 2020-07-05 ENCOUNTER — Ambulatory Visit: Payer: Medicare Other | Admitting: Cardiovascular Disease

## 2020-07-12 ENCOUNTER — Encounter: Payer: Self-pay | Admitting: Specialist

## 2020-07-12 ENCOUNTER — Other Ambulatory Visit: Payer: Self-pay

## 2020-07-12 ENCOUNTER — Ambulatory Visit (INDEPENDENT_AMBULATORY_CARE_PROVIDER_SITE_OTHER): Payer: Medicare Other

## 2020-07-12 ENCOUNTER — Ambulatory Visit (INDEPENDENT_AMBULATORY_CARE_PROVIDER_SITE_OTHER): Payer: Medicare Other | Admitting: Specialist

## 2020-07-12 VITALS — BP 126/81 | HR 103 | Ht 68.0 in | Wt 300.0 lb

## 2020-07-12 DIAGNOSIS — Z981 Arthrodesis status: Secondary | ICD-10-CM | POA: Diagnosis not present

## 2020-07-12 DIAGNOSIS — M7502 Adhesive capsulitis of left shoulder: Secondary | ICD-10-CM

## 2020-07-12 DIAGNOSIS — M7501 Adhesive capsulitis of right shoulder: Secondary | ICD-10-CM

## 2020-07-12 MED ORDER — GABAPENTIN 300 MG PO CAPS: 300.0000 mg | ORAL_CAPSULE | Freq: Every day | ORAL | 1 refills | Status: DC

## 2020-07-12 MED ORDER — HYDROCODONE-ACETAMINOPHEN 5-325 MG PO TABS: 1.0000 | ORAL_TABLET | Freq: Four times a day (QID) | ORAL | 0 refills | Status: DC | PRN

## 2020-07-12 NOTE — Progress Notes (Signed)
Post-Op Visit Note   Patient: Monica Huang           Date of Birth: 1981/09/12           MRN: [id number] Visit Date: 07/12/2020 PCP: Lavonda Jumbo, DO   Assessment & Plan:7 weeks post ACDF C5-6 with complaints of bilateral shoulder pain.  Chief Complaint:  Chief Complaint  Patient presents with  . Neck - Routine Post Op  ROM left shoulder with pain with ER and abduction against resistance. ROM of the shoulder passively causes pain in the left shoulder. Adduction is not painful, non tender over the left A-C joint. Incision left anterior neck is healing well, not erythrema or swelling.  Visit Diagnoses:  1. S/P cervical spinal fusion     Plan: Avoid overhead lifting and overhead use of the arms. Do not lift greater than 5 lbs. Adjust head rest in vehicle to prevent hyperextension if rear ended. Take extra precautions to avoid falling. Start PT for shoulders and arms post C5-6 ACDF. Follow-Up Instructions: No follow-ups on file.   Orders:  Orders Placed This Encounter  Procedures  . XR Cervical Spine 2 or 3 views   No orders of the defined types were placed in this encounter.   Imaging: No results found.  PMFS History: Patient Active Problem List   Diagnosis Date Noted  . Herniation of cervical intervertebral disc with radiculopathy 05/25/2020    Priority: High    Class: Chronic  . Spondylosis without myelopathy or radiculopathy, cervical region 05/25/2020    Priority: High    Class: Chronic  . Vaginal itching 06/05/2020  . S/P cervical spinal fusion 05/26/2020  . Status post cervical spinal fusion 05/25/2020  . Suspected sleep apnea 03/11/2020  . Palpitations 12/23/2019  . Chronic pain of both knees 10/23/2019  . Neck and shoulder pain 07/04/2019  . Close exposure to COVID-19 virus 06/25/2019  . Left knee pain 06/10/2019  . Viral upper respiratory infection 05/22/2019  . Cervical radiculitis 03/10/2019  . Prediabetes 02/06/2019  . Carpal tunnel  syndrome 02/03/2019  . Vaginal discharge 07/05/2016  . Anxiety and depression 06/10/2016  . Other optic atrophy, 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  . Essential hypertension, benign 05/10/2015  . Dizziness 09/30/2014  . Mechanical complication-ventricular(CSF) communicating shunt (HCC) 03/05/2013  . Hidradenitis suppurativa 10/15/2012  . Right hand pain 02/13/2012  . Tobacco abuse 01/03/2012  . Legally blind 01/03/2012  . Obesity 01/03/2012  . Marijuana smoker 01/03/2012   Past Medical History:  Diagnosis Date  . Ambulates with cane   . Chronic back pain   . Chronic leg pain   . Diabetes mellitus without complication (HCC)    Type II - no meds  . Dizziness   . Hydradenitis   . Hyperlipidemia    diet controlle - no meds  . Legally blind    uses cane - some limited vision  . Macular degeneration    patient denies this dx  (dr street dx)  . Pneumonia    x 1  . Pseudotumor cerebri syndrome 2005   shunt placed- and legally blind  . Sciatica   . Sleep apnea    "Mild" - has appt on 06/23/20 to see about cpap machine  . Wears partial dentures    upper and lower    Family History  Problem Relation Age of Onset  . Diabetes Mother   . Hypertension Mother   . Diabetes Father     Past Surgical History:  Procedure Laterality Date  . ANTERIOR  CERVICAL DECOMP/DISCECTOMY FUSION N/A 05/25/2020   Procedure: ANTERIOR CERVICAL DISCECTOMY AND FUSION CERVICAL FIVE THROUGH CERVICAL SIX WITH PLATES, SCREWS, ALLOGRAFT AND LOCAL BONE GRAFT, VIVIGEN;  Surgeon: Kerrin Champagne, MD;  Location: [facility] OR;  Service: Orthopedics;  Laterality: N/A;  . CSF SHUNT     2 revisions  . MULTIPLE EXTRACTIONS WITH ALVEOLOPLASTY Bilateral 11/02/2017   Procedure: MULTIPLE EXTRACTION;  Surgeon: Ocie Doyne, DDS;  Location: [facility] OR;  Service: Oral Surgery;  Laterality: Bilateral;   Social History   Occupational History  . Not on file  Tobacco Use  . Smoking status: Current Every Day Smoker    Packs/day: 0.50    Years: 21.00     Pack years: 10.50    Types: Cigarettes  . Smokeless tobacco: Never Used  Vaping Use  . Vaping Use: Never used  Substance and Sexual Activity  . Alcohol use: Yes    Comment: occasional  . Drug use: Yes    Types: Marijuana    Comment: marijuana -- bag per day. - -4 joints/day  . Sexual activity: Yes    Birth control/protection: None    Comment: 1 partners

## 2020-07-12 NOTE — Patient Instructions (Addendum)
Plan: Avoid overhead lifting and overhead use of the arms. Do not lift greater than 5 lbs. Adjust head rest in vehicle to prevent hyperextension if rear ended. Take extra precautions to avoid falling. Start PT for shoulders and arms post C5-6 ACDF.

## 2020-07-14 ENCOUNTER — Telehealth: Payer: Self-pay | Admitting: Specialist

## 2020-07-14 ENCOUNTER — Encounter: Payer: Self-pay | Admitting: Radiology

## 2020-07-14 NOTE — Telephone Encounter (Signed)
Patient called. She would like a note with a return to work date faxed to her job attention [employer] @ [phone]

## 2020-07-14 NOTE — Telephone Encounter (Signed)
Note faxed to [facility] as patient requested

## 2020-07-20 ENCOUNTER — Telehealth: Payer: Self-pay | Admitting: Specialist

## 2020-07-20 ENCOUNTER — Encounter: Payer: Self-pay | Admitting: Radiology

## 2020-07-20 NOTE — Telephone Encounter (Signed)
Patient called, needs note for Social Security Disability stating she had surgery 05/25/2020 and remains oow at least until her next appt 9/22. Fax attnSusa Loffler [phone]. pts callback [phone]

## 2020-07-20 NOTE — Telephone Encounter (Signed)
Note as been written and faxed

## 2020-07-27 ENCOUNTER — Telehealth: Payer: Self-pay | Admitting: Specialist

## 2020-07-27 NOTE — Telephone Encounter (Signed)
Patient called.   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   Call back: [phone]

## 2020-07-27 NOTE — Telephone Encounter (Signed)
Please advise---we have her out of work till 08/18/20 which is her next follow up appt.

## 2020-07-29 ENCOUNTER — Ambulatory Visit: Payer: Medicare Other | Attending: Specialist | Admitting: Physical Therapy

## 2020-07-29 ENCOUNTER — Encounter: Payer: Self-pay | Admitting: Radiology

## 2020-07-29 NOTE — Telephone Encounter (Signed)
Note written, I called and advised patient the note would be at the front desk to pick up

## 2020-07-29 NOTE — Telephone Encounter (Signed)
[name], I think it is okay to allow her to return to work. No overhead lifting or over head use of arms.And note patient requested return to work Note.  Monica Huang

## 2020-07-29 NOTE — Telephone Encounter (Signed)
Called and spoke with pt to attempt to get a sooner appt with Dr.Kelly. pt states she still has not received her CPAP machine. She states she was told it would be at least 2 weeks and it has now been longer than 2 weeks. Pt states she knew they were behind. Notified I would send this message to our sleep coordinator to see where they were at in the process of getting her machine. Pt verbalized understanding with no other questions at this time.

## 2020-08-06 ENCOUNTER — Encounter: Payer: Self-pay | Admitting: Radiology

## 2020-08-06 ENCOUNTER — Telehealth: Payer: Self-pay | Admitting: Specialist

## 2020-08-06 NOTE — Telephone Encounter (Signed)
Patient called.   She said her job is requesting that the work note she was given be faxed to them instead. The date will now need to be changed to Monday as well.   Fax number: [phone] Patient call back: [phone]

## 2020-08-06 NOTE — Telephone Encounter (Signed)
Note faxed.

## 2020-08-09 ENCOUNTER — Telehealth: Payer: Self-pay

## 2020-08-09 NOTE — Telephone Encounter (Signed)
Patient called about note being faxed her job hasn't received patient would like for it to be sent again and also on left for her to pick up. Fax number: [phone] Patient call back: [phone]

## 2020-08-09 NOTE — Telephone Encounter (Signed)
I called and advised that I can not get the note to got thru on the fax number she has given me, but that I would put it at the front desk for her to pick up.  She did give me another fax number [phone] it did go thru on this number

## 2020-08-18 ENCOUNTER — Ambulatory Visit: Payer: Medicare Other | Admitting: Specialist

## 2020-09-09 ENCOUNTER — Ambulatory Visit: Payer: Self-pay

## 2020-09-09 ENCOUNTER — Ambulatory Visit (INDEPENDENT_AMBULATORY_CARE_PROVIDER_SITE_OTHER): Payer: Medicare Other | Admitting: Surgery

## 2020-09-09 DIAGNOSIS — Z981 Arthrodesis status: Secondary | ICD-10-CM

## 2020-09-09 NOTE — Progress Notes (Signed)
39 year old black female who is status post C5-6 ACDF May 25, 2020 returns.  She is back to working regular hours 8-hour days 5 days/week.  She continues to have soreness/spasm in her neck that extends into the bilateral trapezius and scapular area.  Intermittent numbness and tingling in her arms.  Feels like she is making some improvement.  Exam Pleasant white female alert and oriented in no acute distress.  Does have some limitation with cervical spine range of motion due to pain and stiffness.  Positive bilateral brachial plexus trapezius and middle scapular border tenderness.  Does have spasms of the bilateral trapezius.  She is neurologically intact.   Plan Patient was given a work note allowing her to do 6-hour days 5 days/week x 4 weeks and then she can see about returning to her regular work hours after that.  Follow-up in 4 weeks for recheck with Dr. Otelia Sergeant.  Return sooner if needed.

## 2020-09-13 ENCOUNTER — Ambulatory Visit: Payer: Medicare Other | Admitting: Cardiovascular Disease

## 2020-09-28 ENCOUNTER — Ambulatory Visit: Payer: Medicare Other

## 2020-09-29 ENCOUNTER — Encounter: Payer: Self-pay | Admitting: Student in an Organized Health Care Education/Training Program

## 2020-09-29 ENCOUNTER — Ambulatory Visit (INDEPENDENT_AMBULATORY_CARE_PROVIDER_SITE_OTHER): Payer: Medicare Other | Admitting: Student in an Organized Health Care Education/Training Program

## 2020-09-29 ENCOUNTER — Other Ambulatory Visit: Payer: Self-pay

## 2020-09-29 VITALS — BP 128/86 | HR 85 | Ht 68.0 in | Wt 312.2 lb

## 2020-09-29 DIAGNOSIS — Z23 Encounter for immunization: Secondary | ICD-10-CM

## 2020-09-29 DIAGNOSIS — E119 Type 2 diabetes mellitus without complications: Secondary | ICD-10-CM

## 2020-09-29 LAB — POCT GLYCOSYLATED HEMOGLOBIN (HGB A1C): Hemoglobin A1C: 6.5 % — AB (ref 4.0–5.6)

## 2020-09-29 NOTE — Progress Notes (Signed)
    SUBJECTIVE:   CHIEF COMPLAINT / HPI:  A1c check, covid booster No other concerns or questions today  Diabetes- A1c 6.5 today. Patient endorses that she has not had a lot of activity in the past few months due to having surgery. Went back to work last month.   COVID booster. Last dose was 4/19. No adverse reaction to initial vaccines.  OBJECTIVE:   BP 128/86   Pulse 85   Ht 5\' 8"  (1.727 m)   Wt (!) 312 lb 3.2 oz (141.6 kg)   LMP 09/08/2020 (Approximate)   SpO2 99%   BMI 47.47 kg/m   General: NAD, pleasant, able to participate in exam Neuro: alert and oriented, no focal deficits Psych: Normal affect and mood  ASSESSMENT/PLAN:   Diabetes mellitus without complication (HCC) A1c worsened to 6.5 today. She would like to consult with her heart doctor prior to making any changes for her diabetes. Declined nutrition consult Will try lifestyle modifications and recheck A1c in 3-4 months  COVID-19 vaccine administered booster     09/10/2020, DO [facility]

## 2020-09-29 NOTE — Patient Instructions (Signed)
It was a pleasure to see you today!  To summarize our discussion for this visit:  Your A1c today is 6.5  Please follow up with your heart doctor  Randie Heinz job on getting your booster  Some additional health maintenance measures we should update are: Health Maintenance Due  Topic Date Due  . Hepatitis C Screening  Never done  . PNEUMOCOCCAL POLYSACCHARIDE VACCINE AGE 38-64 HIGH RISK  Never done  . HIV Screening  Never done  . OPHTHALMOLOGY EXAM  06/03/2015  . FOOT EXAM  02/16/2017  . URINE MICROALBUMIN  04/28/2020  .    Call the clinic at [phone] if your symptoms worsen or you have any concerns.   Thank you for allowing me to take part in your care,  Dr. Jamelle Rushing

## 2020-09-30 DIAGNOSIS — E119 Type 2 diabetes mellitus without complications: Secondary | ICD-10-CM | POA: Insufficient documentation

## 2020-09-30 DIAGNOSIS — Z23 Encounter for immunization: Secondary | ICD-10-CM | POA: Insufficient documentation

## 2020-09-30 NOTE — Assessment & Plan Note (Signed)
booster

## 2020-09-30 NOTE — Assessment & Plan Note (Signed)
A1c worsened to 6.5 today. She would like to consult with her heart doctor prior to making any changes for her diabetes. Declined nutrition consult Will try lifestyle modifications and recheck A1c in 3-4 months

## 2020-10-12 ENCOUNTER — Encounter: Payer: Self-pay | Admitting: Cardiovascular Disease

## 2020-10-12 ENCOUNTER — Ambulatory Visit (INDEPENDENT_AMBULATORY_CARE_PROVIDER_SITE_OTHER): Payer: Medicare Other | Admitting: Cardiovascular Disease

## 2020-10-12 ENCOUNTER — Other Ambulatory Visit: Payer: Self-pay

## 2020-10-12 VITALS — BP 139/91 | HR 84 | Ht 68.0 in | Wt 310.6 lb

## 2020-10-12 DIAGNOSIS — R42 Dizziness and giddiness: Secondary | ICD-10-CM

## 2020-10-12 DIAGNOSIS — I440 Atrioventricular block, first degree: Secondary | ICD-10-CM | POA: Diagnosis not present

## 2020-10-12 DIAGNOSIS — H548 Legal blindness, as defined in USA: Secondary | ICD-10-CM | POA: Diagnosis not present

## 2020-10-12 DIAGNOSIS — G4733 Obstructive sleep apnea (adult) (pediatric): Secondary | ICD-10-CM | POA: Diagnosis not present

## 2020-10-12 NOTE — Progress Notes (Signed)
Cardiology Office Note    Date:  10/17/2020   ID:  Monica Huang, DOB Dec 11, 1980, MRN [id number]  PCP:  [name], DO  Cardiologist:  Monica Majestic, MD   59-monthfollow-up evaluation  History of Present Illness:  [name] a 39y.o. female who has a history of blindness, diabetes mellitus, and hidradenitis who was referred through the courtesy of Dr. FAnnamaria Bootsfor further evaluation of dizziness.  I initially saw her in October 2018. I had not seen her since in follow-up until July 2021. She presents for 431-monthvaluation.  [name] legally blind secondary to pseudotumor cerebri and is status post VP shunting. In 2017 she was seen by Dr. BeGwenlyn Huang evaluation of presyncope. She had a history of hypertension on diuretics as well as diabetes. She was smoking 1 pack of cigarettes per day. She denied chest pain or dyspnea. 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 She often felt dizzy when she would get up. He did not feel her cardiac exam was significant and at that point did not recommend further cardiac testing.  The patient states that she has had recurrent episodes of dizziness, particularly if she sits for long periods of time. She is working as a seBuilding surveyoror the blind. She denies any recent episodes of syncope. She denies any episodes of chest pressure. She has noticed lightheadedness. At times she notes her heart rate increases. She denies any awareness of significant arrhythmia. She denies chest pressure. She continues to smoke one half pack per day. She was [facility] and because of her continued recurrent symptomatology and an ECG which suggested irregular bradycardia with potential dropped beats, most likely due to PACs.   She underwent an echo Doppler study which showed an EF of 60 to 65%, mild MR and mild TR.  A 48-hour Holter monitor showed an average rate of 88  bpm and predominant sinus rhythm.  There were episodes of sinus bradycardia with minimum heart rate of 41, and sinus tachycardia up to 144 bpm.  There was also an episode of winky block second-degree type I block.  There were 3 episodes of second-degree heart block with ventricular pauses greater than 2 seconds.  She was seen in the emergency room in January 2021 with palpitations.  She left AMA.  She was seen by Monica Huang March 2021 he continued to have episodes of presyncope about once every other week. She underwent a Zio patch monitor in April 2021 which showed an average heart rate at 99 bpm with sinus rhythm with minimum sinus bradycardia 46 bpm with a maximum sinus tachycardia at 144 bpm.  Her slowest heart rate occurred at 25 bpm at 6:34 AM.  She had several episodes of second-degree Mobitz type I block, and one episode where her rate ranged from 37 to 60 bpm with another episode with rates ranging from 46 to 78 bpm.  There were isolated PACs.  There were no episodes of atrial fibrillation.  Due to concerns for obstructive apnea she underwent a sleep study on March 22, 2020 which showed mild overall sleep apnea with AHI of 5.1 and RDI of 6.9/h.  However sleep apnea was moderate during REM sleep (AHI19/h).  Oxygen desaturation to 89%.  I saw her for follow-up evaluation on June 23, 2020 in a telemedicine encounter. At that time, her dizziness was better.  She typically goes to bed between 930  and 10 PM and wakes up around 5 AM when she works in 7 AM without work.  She had undergone herniated disc surgery of her neck by Dr. Barbarann Huang, in June 2021.  Presently she denies chest pain or shortness of breath.  She is not sleeping well.  She had not yet had a CPAP titration.  Since I last saw her, she apparently was started on AutoPap therapy. She states she was told by the DME company that all she needed to do was to use her CPAP for 4 hours. Her set up date was around August 29, 2020. I obtained a download  today from October 16 through October 10, 2020. Presently she is not meeting compliance with only 63% of usage days. Average usage is only 3 hours and 33 minutes. Her AutoPap was set at a range of 6 to 20 cm of water. When used AHI is excellent at 2.6. When she has been using the CPAP she does notice improvement in how she feels. An Epworth Sleepiness Scale score was calculated in the office today and this endorsed that seven arguing against significant residual daytime sleepiness. She presents for evaluation.   Past Medical History:  Diagnosis Date  . Ambulates with cane   . Chronic back pain   . Chronic leg pain   . Diabetes mellitus without complication (HCC)    Type II - no meds  . Dizziness   . Hydradenitis   . Hyperlipidemia    diet controlle - no meds  . Legally blind    uses cane - some limited vision  . Macular degeneration    patient denies this dx  (dr street dx)  . Pneumonia    x 1  . Pseudotumor cerebri syndrome 2005   shunt placed- and legally blind  . Sciatica   . Sleep apnea    "Mild" - has appt on 06/23/20 to see about cpap machine  . Wears partial dentures    upper and lower    Past Surgical History:  Procedure Laterality Date  . ANTERIOR CERVICAL DECOMP/DISCECTOMY FUSION N/A 05/25/2020   Procedure: ANTERIOR CERVICAL DISCECTOMY AND FUSION CERVICAL FIVE THROUGH CERVICAL SIX WITH PLATES, SCREWS, ALLOGRAFT AND LOCAL BONE GRAFT, VIVIGEN;  Surgeon: Jessy Oto, MD;  Location: [facility];  Service: Orthopedics;  Laterality: N/A;  . CSF SHUNT     2 revisions  . MULTIPLE EXTRACTIONS WITH ALVEOLOPLASTY Bilateral 11/02/2017   Procedure: MULTIPLE EXTRACTION;  Surgeon: Diona Browner, DDS;  Location: [facility];  Service: Oral Surgery;  Laterality: Bilateral;    Current Medications: Outpatient Medications Prior to Visit  Medication Sig Dispense Refill  . clindamycin (CLINDAGEL) 1 % gel Apply topically 2 (two) times daily. (Patient taking differently: Apply 1 application  topically 2 (two) times daily as needed (rash/boils). ) 30 g 0  . ibuprofen (ADVIL) 800 MG tablet Take 800 mg by mouth every 8 (eight) hours as needed.    Marland Kitchen HYDROcodone-acetaminophen (NORCO/VICODIN) 5-325 MG tablet Take 1 tablet by mouth every 6 (six) hours as needed for moderate pain. 30 tablet 0  . gabapentin (NEURONTIN) 300 MG capsule Take 1 capsule (300 mg total) by mouth at bedtime. 30 capsule 1   No facility-administered medications prior to visit.     Allergies:   Patient has no known allergies.   Social History   Socioeconomic History  . Marital status: Single    Spouse name: Not on file  . Number of children: Not on file  . Years of  education: Not on file  . Highest education level: Not on file  Occupational History  . Not on file  Tobacco Use  . Smoking status: Current Every Day Smoker    Packs/day: 0.50    Years: 21.00    Pack years: 10.50    Types: Cigarettes  . Smokeless tobacco: Never Used  Vaping Use  . Vaping Use: Never used  Substance and Sexual Activity  . Alcohol use: Yes    Comment: occasional  . Drug use: Yes    Types: Marijuana    Comment: marijuana -- bag per day. - -4 joints/day  . Sexual activity: Yes    Birth control/protection: None    Comment: 1 partners  Other Topics Concern  . Not on file  Social History Narrative   On disability since 2005 due to vision loss from psuedotumor cerebri.   Did not finish highschool-- completed the 9th grade.    Walking 2 x week for 75mn.          Social Determinants of Health   Financial Resource Strain:   . Difficulty of Paying Living Expenses: Not on file  Food Insecurity:   . Worried About RCharity fundraiserin the Last Year: Not on file  . Ran Out of Food in the Last Year: Not on file  Transportation Needs:   . Lack of Transportation (Medical): Not on file  . Lack of Transportation (Non-Medical): Not on file  Physical Activity:   . Days of Exercise per Week: Not on file  . Minutes of Exercise  per Session: Not on file  Stress:   . Feeling of Stress : Not on file  Social Connections:   . Frequency of Communication with Friends and Family: Not on file  . Frequency of Social Gatherings with Friends and Family: Not on file  . Attends Religious Services: Not on file  . Active Member of Clubs or Organizations: Not on file  . Attends CArchivistMeetings: Not on file  . Marital Status: Not on file     Family History:  The patient's family history includes Diabetes in her father and mother; Hypertension in her mother.   ROS General: Negative; No fevers, chills, or night sweats;  HEENT: Patient is blind negative; No changes in  hearing, sinus congestion, difficulty swallowing Pulmonary: Negative; No cough, wheezing, shortness of breath, hemoptysis Cardiovascular: Negative; No chest pain, presyncope, syncope, palpitations GI: Negative; No nausea, vomiting, diarrhea, or abdominal pain GU: Negative; No dysuria, hematuria, or difficulty voiding Musculoskeletal: Negative; no myalgias, joint pain, or weakness Hematologic/Oncology: Negative; no easy bruising, bleeding Endocrine: Negative; no heat/cold intolerance; no diabetes Neuro: Negative; no changes in balance, headaches Skin: Negative; No rashes or skin lesions Psychiatric: Negative; No behavioral problems, depression Sleep: Negative; No snoring, daytime sleepiness, hypersomnolence, bruxism, restless legs, hypnogognic hallucinations, no cataplexy Other comprehensive 14 point system review is negative.   PHYSICAL EXAM:   VS:  BP (!) 139/91 (BP Location: Left Arm, Patient Position: Sitting)   Pulse 84   Ht '5\' 8"'  (1.727 m)   Wt (!) 310 lb 9.6 oz (140.9 kg)   SpO2 92%   BMI 47.23 kg/m     Repeat BP by me 132/84  Wt Readings from Last 3 Encounters:  10/12/20 (!) 310 lb 9.6 oz (140.9 kg)  09/29/20 (!) 312 lb 3.2 oz (141.6 kg)  07/12/20 300 lb (136.1 kg)    General: Alert, oriented, no distress.  Skin: normal turgor,  no rashes, warm  and dry HEENT: Normocephalic, atraumatic. Pupils equal round and reactive to light; sclera anicteric; extraocular muscles intact;  Nose without nasal septal hypertrophy Mouth/Parynx benign; Mallinpatti scale 3/4 Neck: Thick neck; no JVD, no carotid bruits; normal carotid upstroke Lungs: clear to ausculatation and percussion; no wheezing or rales Chest wall: without tenderness to palpitation Heart: PMI not displaced, RRR, s1 s2 normal, 1/6 systolic murmur, no diastolic murmur, no rubs, gallops, thrills, or heaves Abdomen: Central adiposity soft, nontender; no hepatosplenomehaly, BS+; abdominal aorta nontender and not dilated by palpation. Back: no CVA tenderness Pulses 2+ Musculoskeletal: full range of motion, normal strength, no joint deformities Extremities: no clubbing cyanosis or edema, Homan's sign negative  Neurologic: grossly nonfocal; Cranial nerves grossly wnl Psychologic: Normal mood and affect   Studies/Labs Reviewed:   EKG:  EKG is  ordered today.  ECG (independently read by me): Sinus rhythm at 84; 1st degree AV block, PR 238 msec  I personally reviewed her last ECG from May 19, 2020 which showed normal sinus rhythm at 87, first-degree AV block with a PR interval of 244 ms, and left ventricular hypertrophy.   Recent Labs: BMP Latest Ref Rng & Units 05/26/2020 05/19/2020 12/22/2019  Glucose 70 - 99 mg/dL 203(H) 131(H) 81  BUN 6 - 20 mg/dL '9 11 10  ' Creatinine 0.44 - 1.00 mg/dL 0.77 1.08(H) 0.89  Sodium 135 - 145 mmol/L 135 137 139  Potassium 3.5 - 5.1 mmol/L 4.2 3.8 4.0  Chloride 98 - 111 mmol/L 104 103 104  CO2 22 - 32 mmol/L '22 23 24  ' Calcium 8.9 - 10.3 mg/dL 8.7(L) 9.0 8.8(L)     Hepatic Function Latest Ref Rng & Units 05/19/2020 12/22/2019 05/10/2015  Total Protein 6.5 - 8.1 g/dL 7.5 7.6 7.5  Albumin 3.5 - 5.0 g/dL 3.5 3.6 4.1  AST 15 - 41 U/L 16 14(L) 12  ALT 0 - 44 U/L '19 15 20  ' Alk Phosphatase 38 - 126 U/L 57 51 54  Total Bilirubin 0.3 - 1.2  mg/dL 0.5 0.6 0.3    CBC Latest Ref Rng & Units 05/26/2020 05/19/2020 12/22/2019  WBC 4.0 - 10.5 K/uL 19.4(H) 9.0 7.8  Hemoglobin 12.0 - 15.0 g/dL 13.5 15.0 14.2  Hematocrit 36 - 46 % 40.5 46.3(H) 44.2  Platelets 150 - 400 K/uL 323 335 301   Lab Results  Component Value Date   MCV 96.0 05/26/2020   MCV 101.3 (H) 05/19/2020   MCV 99.1 12/22/2019   Lab Results  Component Value Date   TSH 0.769 02/03/2019   Lab Results  Component Value Date   HGBA1C 6.5 (A) 09/29/2020     BNP No results found for: BNP  ProBNP No results found for: PROBNP   Lipid Panel     Component Value Date/Time   CHOL 147 03/11/2015 0841   TRIG 106 03/11/2015 0841   HDL 45 (L) 03/11/2015 0841   CHOLHDL 3.3 03/11/2015 0841   VLDL 21 03/11/2015 0841   LDLCALC 81 03/11/2015 0841   LDLDIRECT 100 (H) 01/01/2012 1155     RADIOLOGY: No results found.   Additional studies/ records that were reviewed today include:   SLEEP STUDY: 03/22/2020 SLEEP ARCHITECTURE The study was initiated at 9:56:10 PM and ended at 3:59:43 AM.  Sleep onset time was 46.4 minutes and the sleep efficiency was 64.6%%. The total sleep time was 235 minutes.  Stage REM latency was 58.5 minutes.  The patient spent 6.8%% of the night in stage N1 sleep, 67.7%% in stage N2 sleep,  0.0%% in stage N3 and 25.5% in REM.  Alpha intrusion was absent.  Supine sleep was 66.81%.  RESPIRATORY PARAMETERS The overall apnea/hypopnea index (AHI) was 5.1 per hour. The respiratory disturbancer index (RDI) was 6.9/h. There were 0 total apneas, including 0 obstructive, 0 central and 0 mixed apneas. There were 20 hypopneas and 7 RERAs.  The AHI during Stage REM sleep was 19.0 per hour.  AHI while supine was 3.8 per hour.  The mean oxygen saturation was 96.2%. The minimum SpO2 during sleep was 89.0%.  Loud noring in all positions was noted during this study.  CARDIAC DATA The 2 lead EKG demonstrated sinus rhythm. The mean heart  rate was 82.2 beats per minute. Other EKG findings include: None.  LEG MOVEMENT DATA The total PLMS were 0 with a resulting PLMS index of 0.0. Associated arousal with leg movement index was 0.0 .  IMPRESSIONS - Mild obstructive sleep apnea occurred during this study (AHI 5.1/h; RDI 6.9/h); however, sleep apnea was moderate during REM sleep (AHI 19.0/h) - No significant central sleep apnea occurred during this study (CAI = 0.0/h). - The patient had minimal desaturation during the study (Min O2 = 89.0%) - Snoring was audible during this study. - Reduced sleep efficiency at 64.6%. Wake after sleep onset (WASO) was 82.1 minutes. - No cardiac abnormalities were noted during this study. - Clinically significant periodic limb movements did not occur during sleep. No significant associated arousals.  DIAGNOSIS - Obstructive Sleep Apnea (327.23 [G47.33 ICD-10]) - Nocturnal Hypoxemia (327.26 [G47.36 ICD-10])  RECOMMENDATIONS - In this patient with significant comorbidities including significant morbid obesity and moderate sleep apnea during REM sleep, recommend CPAP therapy for optimal treatment of her sleep disordered breathing. With her blindness, recommend CPAP titration study. If unable to obtain, then consider Auto-PAP at 6 -16 cm of water. If patient is against CPAP can consider a customized oral appliance. - Effort should be made to optimize nasal and oropharyngeal patency. - Avoid alcohol, sedatives and other CNS depressants that may worsen sleep apnea and disrupt normal sleep architecture. - Sleep hygiene should be reviewed to assess factors that may improve sleep quality. - Weight management (BMI 46) and regular exercise should be initiated or continued if appropriate. - Sleep clinic evaluation if necessary to discuss options. If CPAP instituted recommend a download in 30 days and sleep clinic evaluation.   ASSESSMENT:    1. OSA (obstructive sleep apnea)   2. Morbid obesity ([place])    3. Legally blind   4. First degree AV block   5. Dizziness     PLAN:  Ms. Saesha Llerenas is a very pleasant 39 year old African-American female who has a history of blindness, diabetes mellitus, and is status post VP shunting for pseudotumor cerebri which led to her being legally blind. Presently, she is no longer complaining of dizziness and on prior monitoring had shown bradycardia and Mobitz type I block without episodes of atrial fibrillation. Her ECG today shows sinus rhythm at 84 bpm with first-degree AV block. She has been on AutoPap therapy for obstructive sleep apnea. I had a lengthy discussion with both she and her husband today in the office and discussed the effects of sleep apnea on normal sleep architecture and potential adverse cardiovascular consequences if left untreated. We discussed optimal sleep duration to be at least 7 to 8 hours per night and that she should use CPAP for the nights entirety. We discussed that the preponderance of REM sleep occurs in the second half of  the night when her sleep apnea is more significant she has not been using therapy. She states the reason she had it was because she was told she only needed to use for 4 hours. After understanding the rationale she is now committed to use for the nights entirety. We discussed the necessity of meeting compliance and although in order to meet compliance one only needs to have 70% of usage days and 70% of days use greater than 4 hours I have reiterated the importance of using therapy throughout the entire night. I will change her CPAP settings to a minimum pressure of nine and maximum of 18 cm of water. A download will be obtained in 1 month which will hopefully reveal compliance. Her blood pressure today is stable at me and at home she states runs typically 122/84. I will see her in 6 months for reevaluation or sooner if needed.  Medication Adjustments/Labs and Tests Ordered: Current medicines are reviewed at length with  the patient today.  Concerns regarding medicines are outlined above.  Medication changes, Labs and Tests ordered today are listed in the Patient Instructions below. Patient Instructions  Medication Instructions:  Your physician recommends that you continue on your current medications as directed. Please refer to the Current Medication list given to you today.  *If you need a refill on your cardiac medications before your next appointment, please call your pharmacy*  Lab Work: NONE   Testing/Procedures: NONE  Follow-Up: At [facility], you and your health needs are our priority.  As part of our continuing mission to provide you with exceptional heart care, we have created designated Provider Care Teams.  These Care Teams include your primary Cardiologist (physician) and Advanced Practice Providers (APPs -  Physician Assistants and Nurse Practitioners) who all work together to provide you with the care you need, when you need it.  We recommend signing up for the patient portal called "MyChart".  Sign up information is provided on this After Visit Summary.  MyChart is used to connect with patients for Virtual Visits (Telemedicine).  Patients are able to view lab/test results, encounter notes, upcoming appointments, etc.  Non-urgent messages can be sent to your provider as well.   To learn more about what you can do with MyChart, go to [email or web address].    Your next appointment:   6 month(s)  You will receive a reminder letter in the mail two months in advance. If you don't receive a letter, please call our office to schedule the follow-up appointment.  The format for your next appointment:   In Person  Provider:   [name], MD  Other Instructions  ADJUSTMENTS [place]. IF YOU HAVE ANY ISSUES BETWEEN NOW AND YOUR FOLLOW UP CALL THE OFFICE     Signed, Monica Majestic, MD  10/17/2020 9:07 AM    [facility] [address] [place], [place], [place]  [address] Phone: [phone]

## 2020-10-12 NOTE — Patient Instructions (Addendum)
Medication Instructions:  Your physician recommends that you continue on your current medications as directed. Please refer to the Current Medication list given to you today.  *If you need a refill on your cardiac medications before your next appointment, please call your pharmacy*  Lab Work: NONE   Testing/Procedures: NONE  Follow-Up: At [facility], you and your health needs are our priority.  As part of our continuing mission to provide you with exceptional heart care, we have created designated Provider Care Teams.  These Care Teams include your primary Cardiologist (physician) and Advanced Practice Providers (APPs -  Physician Assistants and Nurse Practitioners) who all work together to provide you with the care you need, when you need it.  We recommend signing up for the patient portal called "MyChart".  Sign up information is provided on this After Visit Summary.  MyChart is used to connect with patients for Virtual Visits (Telemedicine).  Patients are able to view lab/test results, encounter notes, upcoming appointments, etc.  Non-urgent messages can be sent to your provider as well.   To learn more about what you can do with MyChart, go to [email or web address].    Your next appointment:   6 month(s)  You will receive a reminder letter in the mail two months in advance. If you don't receive a letter, please call our office to schedule the follow-up appointment.  The format for your next appointment:   In Person  Provider:   Nicki Gu[place], MD  Other Instructions  ADJUSTMENTS HAVE BEEN MADE IN YOUR SLEEP MACHINE. IF YOU HAVE ANY ISSUES BETWEEN NOW AND YOUR FOLLOW UP CALL THE OFFICE

## 2020-10-15 ENCOUNTER — Telehealth: Payer: Self-pay

## 2020-10-15 NOTE — Telephone Encounter (Signed)
Thank you for getting this patient scheduled. Will not restart metformin at this time, will discuss with patient at her visit. Thank you.   Cale Decarolis Autry-Lott, DO 10/15/2020, 8:32 PM PGY-2, [facility]

## 2020-10-15 NOTE — Telephone Encounter (Signed)
Patient calls nurse line requesting to restart Metformin. Patient states her a1c on 11/3 was 6.5. At the time she wanted to try diet modifications, however she would like to restart Metformin 500mg . I have scheduled her an apt with PCP on 11/29, as patient would like to discuss her blood pressure. Please advise on restarting Metformin in the meantime.

## 2020-10-17 ENCOUNTER — Encounter: Payer: Self-pay | Admitting: Cardiovascular Disease

## 2020-10-24 NOTE — Progress Notes (Signed)
    SUBJECTIVE:   CHIEF COMPLAINT / HPI:   [name] is a 39 yo F who presents for the issue below, she is accompanied by her significant other [name].   Hypertension: - Medications: Previously taking HCTZ 12.5 mg  - Compliance: Not taking any medications at this time - Checking BP at home: Yes last readings were 135/90, 142/95 on a extra large cuff - Denies any SOB, CP, vision changes, LE edema - Endorses light-headedness when she gets up; episode 2x last week. HA around thanksgiving time.  Tobacco abuse Smoke 5-8 cigarettes/day. Has desire to quit and requests patches.   PERTINENT  PMH / PSH: T2DM, Legally blind  OBJECTIVE:   BP 118/72   Pulse 88   Wt (!) 307 lb (139.3 kg)   SpO2 99%   BMI 46.68 kg/m   General: Appears well, no acute distress. Age appropriate. Respiratory: normal effort ASSESSMENT/PLAN:   1. Tobacco abuse - nicotine (QC NICOTINE TRANSDERMAL SYSTEM) 14 mg/24hr patch; Place 1 patch (14 mg total) onto the skin daily. Apply patch daily for 6 weeks.  Dispense: 42 patch; Refill: 0 - nicotine (NICODERM CQ - DOSED IN MG/24 HR) 7 mg/24hr patch; Place 1 patch (7 mg total) onto the skin daily for 14 days. Apply daily for 2 weeks starting 12/08/2019  Dispense: 14 patch; Refill: 0  2. Elevated blood pressure reading BP readings appear elevated at home but is well within normal limits in office today. Would not restart antihypertensives at this time.  -Keep BP log return in 2 weeks for BP recheck -Consider orthostatic vital signs at follow up    Lavonda Jumbo, DO [facility]

## 2020-10-25 ENCOUNTER — Ambulatory Visit (INDEPENDENT_AMBULATORY_CARE_PROVIDER_SITE_OTHER): Payer: Medicare Other | Admitting: Family Medicine

## 2020-10-25 ENCOUNTER — Other Ambulatory Visit: Payer: Self-pay

## 2020-10-25 ENCOUNTER — Encounter: Payer: Self-pay | Admitting: Family Medicine

## 2020-10-25 VITALS — BP 118/72 | HR 88 | Wt 307.0 lb

## 2020-10-25 DIAGNOSIS — Z720 Tobacco use: Secondary | ICD-10-CM | POA: Diagnosis not present

## 2020-10-25 DIAGNOSIS — R030 Elevated blood-pressure reading, without diagnosis of hypertension: Secondary | ICD-10-CM | POA: Diagnosis not present

## 2020-10-25 MED ORDER — NICOTINE 14 MG/24HR TD PT24: 14.0000 mg | MEDICATED_PATCH | Freq: Every day | TRANSDERMAL | 0 refills | Status: AC

## 2020-10-25 MED ORDER — NICOTINE 7 MG/24HR TD PT24: 7.0000 mg | MEDICATED_PATCH | Freq: Every day | TRANSDERMAL | 0 refills | Status: AC

## 2020-10-25 NOTE — Patient Instructions (Addendum)
It was wonderful to see you today.  Today we talked about:  Your BP which is normal today. I would start medication today. Please keep a log at home and return in 2 weeks for another BP check with your log to see if you need to start any medication.  I have prescribed nicotine patches (the 14 mg patches are for a total of 6 weeks, followed by the 7 mg patches for a total of 2 weeks.   Please be sure to schedule follow up at the front  desk before you leave today.   Please call the clinic at [phone] if your symptoms worsen or you have any concerns. It was our pleasure to serve you.  Dr. Salvadore Dom

## 2020-10-26 ENCOUNTER — Telehealth: Payer: Self-pay | Admitting: *Deleted

## 2020-10-26 DIAGNOSIS — R030 Elevated blood-pressure reading, without diagnosis of hypertension: Secondary | ICD-10-CM | POA: Insufficient documentation

## 2020-10-26 NOTE — Telephone Encounter (Signed)
-----   Message from Burnell Blanks, LPN sent at [phone]  4:51 PM EST ----- Hello all Patient needs another download in 4-6 weeks for compliance Thanks Juliette Alcide

## 2020-10-26 NOTE — Telephone Encounter (Signed)
Reminder made to get a d/l in 4-6 weeks.

## 2021-02-23 ENCOUNTER — Encounter: Payer: Self-pay | Admitting: Surgery

## 2021-02-23 ENCOUNTER — Ambulatory Visit (INDEPENDENT_AMBULATORY_CARE_PROVIDER_SITE_OTHER): Payer: 59 | Admitting: Surgery

## 2021-02-23 ENCOUNTER — Ambulatory Visit: Payer: Self-pay

## 2021-02-23 ENCOUNTER — Other Ambulatory Visit: Payer: Self-pay

## 2021-02-23 VITALS — BP 123/86 | HR 106 | Ht 68.0 in | Wt 307.0 lb

## 2021-02-23 DIAGNOSIS — M7542 Impingement syndrome of left shoulder: Secondary | ICD-10-CM | POA: Diagnosis not present

## 2021-02-23 DIAGNOSIS — G5692 Unspecified mononeuropathy of left upper limb: Secondary | ICD-10-CM

## 2021-02-23 DIAGNOSIS — Z981 Arthrodesis status: Secondary | ICD-10-CM | POA: Diagnosis not present

## 2021-02-23 MED ORDER — METHYLPREDNISOLONE 4 MG PO TABS: ORAL_TABLET | ORAL | 0 refills | Status: DC

## 2021-02-23 MED ORDER — METHOCARBAMOL 500 MG PO TABS: 500.0000 mg | ORAL_TABLET | Freq: Three times a day (TID) | ORAL | 0 refills | Status: DC | PRN

## 2021-02-23 NOTE — Progress Notes (Signed)
Office Visit Note   Patient: Monica Huang           Date of Birth: [birth year]           MRN: [id number] Visit Date: 02/23/2021              Requested by: Lavonda Jumbo, DO [address] [place],  [place] [address] PCP: Lavonda Jumbo, DO   Assessment & Plan: Visit Diagnoses:  1. S/P cervical spinal fusion   2. Neuropathy, arm, left   3. Impingement syndrome of left shoulder   4.      Possible double crush phenomenon  Plan: With patient's multiple complaints today we will attempt conservative treatment with Medrol Dosepak 6-day taper to be taken as directed and Robaxin for spasms.  Recommended patient be out of work x2 weeks and to return office visit with me in note was given.  Will decide next office visit if she needs further imaging with CT versus MRI cervical spine, NCV/EMG study left upper extremity to rule out cervical radiculopathy, left cubital tunnel syndrome, left carpal tunnel.  If shoulder continues to be symptomatic I will get three-view x-rays.  Patient comfortable with today's plan and all questions were answered.  Follow-Up Instructions: Return in about 2 weeks (around 03/09/2021) for With [facility] recheck neck, left shoulder.   Orders:  Orders Placed This Encounter  Procedures  . XR Cervical Spine 2 or 3 views   No orders of the defined types were placed in this encounter.     Procedures: No procedures performed   Clinical Data: No additional findings.   Subjective: Chief Complaint  Patient presents with  . Neck - Pain    HPI 40 year old black female comes in today with multiple complaints.  Status post C5-6 ACDF by Dr. Otelia Sergeant 09/24/2020.  She was last seen in the clinic by me September 09, 2020 and at that time was complaining of ongoing neck pain with radiation into the shoulders and scapula with intermittent numbness and tingling into her arms.  I have scheduled follow-up visit with Dr. Otelia Sergeant 4 weeks later but she did not keep that appointment.   States that she is continued have similar symptoms but overall she was about 70 to 80% improved after surgery.  About a week and a half ago she began having increased pain in the neck with radiation into the shoulder blades and into the left shoulder and down her arm.  No right-sided symptoms.  Left shoulder also aggravated with overhead activity but she states at times holding her arm above her head does help to relieve some of her neck pain.  Currently using ibuprofen without any improvement.  With her job she is currently doing some sewing.  She also mentions that at times she feels like she does not have control of her left arm and it jumps and causes her to throw objects at times.    Review of Systems No current cardiac pulmonary GI GU issues  Objective: Vital Signs: BP 123/86   Pulse (!) 106   Ht 5\' 8"  (1.727 m)   Wt (!) 307 lb (139.3 kg)   BMI 46.68 kg/m   Physical Exam HENT:     Head: Normocephalic.  Pulmonary:     Effort: No respiratory distress.  Musculoskeletal:     Comments: Gait is normal.  Positive left greater than right brachial plexus trapezius and scapular tenderness.  Some discomfort with Spurling test.  Right shoulder unremarkable.  Left shoulder shows good  range of motion but with some discomfort.  Positive impingement test.  Negative drop arm test.  Some pain and feeling of weakness with supraspinatus resistance.  Tenderness of the proximal biceps tendon.  No tendon defect.  Right elbow unremarkable.  Left elbow good range of motion.  Positive Tinel's over the cubital tunnel.  Positive left elbow flexion test.  Right wrist unremarkable.  Left wrist good range of motion.  Positive Tinel's.  Positive Phalen's.  No focal motor deficits.  Neurological:     Mental Status: She is alert and oriented to person, place, and time.     Ortho Exam  Specialty Comments:  No specialty comments available.  Imaging: No results found.   PMFS History: Patient Active Problem  List   Diagnosis Date Noted  . Elevated blood pressure reading 10/26/2020  . Diabetes mellitus without complication (HCC)   . COVID-19 vaccine administered   . Vaginal itching 06/05/2020  . S/P cervical spinal fusion 05/26/2020  . Herniation of cervical intervertebral disc with radiculopathy 05/25/2020    Class: Chronic  . Spondylosis without myelopathy or radiculopathy, cervical region 05/25/2020    Class: Chronic  . Status post cervical spinal fusion 05/25/2020  . Suspected sleep apnea 03/11/2020  . Palpitations 12/23/2019  . Chronic pain of both knees 10/23/2019  . Neck and shoulder pain 07/04/2019  . Close exposure to COVID-19 virus 06/25/2019  . Left knee pain 06/10/2019  . Viral upper respiratory infection 05/22/2019  . Cervical radiculitis 03/10/2019  . Prediabetes 02/06/2019  . Carpal tunnel syndrome 02/03/2019  . Vaginal discharge 07/05/2016  . Anxiety and depression 06/10/2016  . Other optic atrophy, 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  . Essential hypertension, benign 05/10/2015  . Dizziness 09/30/2014  . Mechanical complication-ventricular(CSF) communicating shunt (HCC) 03/05/2013  . Hidradenitis suppurativa 10/15/2012  . Right hand pain 02/13/2012  . Tobacco abuse 01/03/2012  . Legally blind 01/03/2012  . Obesity 01/03/2012  . Marijuana smoker 01/03/2012   Past Medical History:  Diagnosis Date  . Ambulates with cane   . Chronic back pain   . Chronic leg pain   . Diabetes mellitus without complication (HCC)    Type II - no meds  . Dizziness   . Hydradenitis   . Hyperlipidemia    diet controlle - no meds  . Legally blind    uses cane - some limited vision  . Macular degeneration    patient denies this dx  (dr street dx)  . Pneumonia    x 1  . Pseudotumor cerebri syndrome 2005   shunt placed- and legally blind  . Sciatica   . Sleep apnea    "Mild" - has appt on 06/23/20 to see about cpap machine  . Wears partial dentures    upper and lower    Family History   Problem Relation Age of Onset  . Diabetes Mother   . Hypertension Mother   . Diabetes Father     Past Surgical History:  Procedure Laterality Date  . ANTERIOR CERVICAL DECOMP/DISCECTOMY FUSION N/A 05/25/2020   Procedure: ANTERIOR CERVICAL DISCECTOMY AND FUSION CERVICAL FIVE THROUGH CERVICAL SIX WITH PLATES, SCREWS, ALLOGRAFT AND LOCAL BONE GRAFT, VIVIGEN;  Surgeon: Kerrin Champagne, MD;  Location: [facility] OR;  Service: Orthopedics;  Laterality: N/A;  . CSF SHUNT     2 revisions  . MULTIPLE EXTRACTIONS WITH ALVEOLOPLASTY Bilateral 11/02/2017   Procedure: MULTIPLE EXTRACTION;  Surgeon: Ocie Doyne, DDS;  [facility];  Service: Oral Surgery;  Laterality: Bilateral;   Social History  Occupational History  . Not on file  Tobacco Use  . Smoking status: Current Every Day Smoker    Packs/day: 0.50    Years: 21.00    Pack years: 10.50    Types: Cigarettes  . Smokeless tobacco: Never Used  Vaping Use  . Vaping Use: Never used  Substance and Sexual Activity  . Alcohol use: Yes    Comment: occasional  . Drug use: Yes    Types: Marijuana    Comment: marijuana -- bag per day. - -4 joints/day  . Sexual activity: Yes    Birth control/protection: None    Comment: 1 partners

## 2021-02-24 ENCOUNTER — Telehealth: Payer: Self-pay | Admitting: Specialist

## 2021-02-24 NOTE — Telephone Encounter (Signed)
Patient's friend Allie Bossier jones dropped off medical release form, FMLA, and $25.00 cash payment to [employer]. Accepted 02/24/21

## 2021-03-09 ENCOUNTER — Ambulatory Visit (INDEPENDENT_AMBULATORY_CARE_PROVIDER_SITE_OTHER): Payer: 59 | Admitting: Surgery

## 2021-03-09 ENCOUNTER — Telehealth: Payer: Self-pay | Admitting: Surgery

## 2021-03-09 ENCOUNTER — Encounter: Payer: Self-pay | Admitting: Surgery

## 2021-03-09 VITALS — BP 118/78 | HR 97 | Ht 68.0 in | Wt 307.0 lb

## 2021-03-09 DIAGNOSIS — Z981 Arthrodesis status: Secondary | ICD-10-CM

## 2021-03-09 DIAGNOSIS — M5412 Radiculopathy, cervical region: Secondary | ICD-10-CM | POA: Diagnosis not present

## 2021-03-09 DIAGNOSIS — G5692 Unspecified mononeuropathy of left upper limb: Secondary | ICD-10-CM

## 2021-03-09 NOTE — Progress Notes (Signed)
Office Visit Note   Patient: Monica Huang           Date of Birth: [birth year]           MRN: [id number] Visit Date: 03/09/2021              Requested by: Lavonda Jumbo, DO [address] [place],  [place] [address] PCP: Lavonda Jumbo, DO   Assessment & Plan: Visit Diagnoses:  1. S/P cervical spinal fusion   2. Radiculopathy, cervical region   3. Neuropathy, arm, left     Plan: Since patient continues to be symptomatic I will schedule MRI cervical spine to rule out HNP/stenosis.  We will also order NCV/EMG study left upper extremity to rule out cervical radiculopathy versus peripheral entrapment neuropathy.  Follow-up with Dr. In 3 weeks for recheck to discuss results and further treatment options.  Follow-Up Instructions: Return in about 1 week (around 03/16/2021) for with dr Otelia Sergeant to review mri cervical spine and NCV/emg.   Orders:  Orders Placed This Encounter  Procedures  . MR Cervical Spine w/o contrast  . Ambulatory referral to Physical Medicine Rehab   No orders of the defined types were placed in this encounter.     Procedures: No procedures performed   Clinical Data: No additional findings.   Subjective: Chief Complaint  Patient presents with  . Neck - Follow-up    HPI 40 year old black female status post C5-6 ACDF June 2021 returns.  She states that prednisone taper helped some of her neck pain and radicular symptoms.  She continues to describe having left-sided neck pain into her left trapezius and scapular region.  Ongoing numbness and tingling down to left hand.  No right arm symptoms.  States that she has had some improvement of her impingement symptoms left shoulder.  Objective: Vital Signs: BP 118/78   Pulse 97   Ht 5\' 8"  (1.727 m)   Wt (!) 307 lb (139.3 kg)   BMI 46.68 kg/m   Physical Exam Constitutional:      Appearance: She is obese.  Pulmonary:     Effort: No respiratory distress.  Musculoskeletal:     Comments: Exam  cervical spine she has moderate left brachial plexus trapezius and scapular tenderness.  Negative impingement test left shoulder.  Negative drop arm.  Positive Tinel's over the left cubital and left carpal tunnel.  Neurological:     Mental Status: She is oriented to person, place, and time.     Ortho Exam  Specialty Comments:  No specialty comments available.  Imaging: No results found.   PMFS History: Patient Active Problem List   Diagnosis Date Noted  . Elevated blood pressure reading 10/26/2020  . Diabetes mellitus without complication (HCC)   . COVID-19 vaccine administered   . Vaginal itching 06/05/2020  . S/P cervical spinal fusion 05/26/2020  . Herniation of cervical intervertebral disc with radiculopathy 05/25/2020    Class: Chronic  . Spondylosis without myelopathy or radiculopathy, cervical region 05/25/2020    Class: Chronic  . Status post cervical spinal fusion 05/25/2020  . Suspected sleep apnea 03/11/2020  . Palpitations 12/23/2019  . Chronic pain of both knees 10/23/2019  . Neck and shoulder pain 07/04/2019  . Close exposure to COVID-19 virus 06/25/2019  . Left knee pain 06/10/2019  . Viral upper respiratory infection 05/22/2019  . Cervical radiculitis 03/10/2019  . Prediabetes 02/06/2019  . Carpal tunnel syndrome 02/03/2019  . Vaginal discharge 07/05/2016  . Anxiety and depression 06/10/2016  . Other  optic atrophy, 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  . Essential hypertension, benign 05/10/2015  . Dizziness 09/30/2014  . Mechanical complication-ventricular(CSF) communicating shunt (HCC) 03/05/2013  . Hidradenitis suppurativa 10/15/2012  . Right hand pain 02/13/2012  . Tobacco abuse 01/03/2012  . Legally blind 01/03/2012  . Obesity 01/03/2012  . Marijuana smoker 01/03/2012   Past Medical History:  Diagnosis Date  . Ambulates with cane   . Chronic back pain   . Chronic leg pain   . Diabetes mellitus without complication (HCC)    Type II - no meds  . Dizziness    . Hydradenitis   . Hyperlipidemia    diet controlle - no meds  . Legally blind    uses cane - some limited vision  . Macular degeneration    patient denies this dx  (dr street dx)  . Pneumonia    x 1  . Pseudotumor cerebri syndrome 2005   shunt placed- and legally blind  . Sciatica   . Sleep apnea    "Mild" - has appt on 06/23/20 to see about cpap machine  . Wears partial dentures    upper and lower    Family History  Problem Relation Age of Onset  . Diabetes Mother   . Hypertension Mother   . Diabetes Father     Past Surgical History:  Procedure Laterality Date  . ANTERIOR CERVICAL DECOMP/DISCECTOMY FUSION N/A 05/25/2020   Procedure: ANTERIOR CERVICAL DISCECTOMY AND FUSION CERVICAL FIVE THROUGH CERVICAL SIX WITH PLATES, SCREWS, ALLOGRAFT AND LOCAL BONE GRAFT, VIVIGEN;  Surgeon: Kerrin Champagne, MD;  Location: [facility] OR;  Service: Orthopedics;  Laterality: N/A;  . CSF SHUNT     2 revisions  . MULTIPLE EXTRACTIONS WITH ALVEOLOPLASTY Bilateral 11/02/2017   Procedure: MULTIPLE EXTRACTION;  Surgeon: Ocie Doyne, DDS;  Location: [facility] OR;  Service: Oral Surgery;  Laterality: Bilateral;   Social History   Occupational History  . Not on file  Tobacco Use  . Smoking status: Current Every Day Smoker    Packs/day: 0.50    Years: 21.00    Pack years: 10.50    Types: Cigarettes  . Smokeless tobacco: Never Used  Vaping Use  . Vaping Use: Never used  Substance and Sexual Activity  . Alcohol use: Yes    Comment: occasional  . Drug use: Yes    Types: Marijuana    Comment: marijuana -- bag per day. - -4 joints/day  . Sexual activity: Yes    Birth control/protection: None    Comment: 1 partners

## 2021-03-09 NOTE — Telephone Encounter (Signed)
Changed order to go to [facility]

## 2021-03-09 NOTE — Telephone Encounter (Signed)
Patient called. Says Dr. [place] Blas next available is not until May. Would like a referral somewhere else. Her call back number is [phone]

## 2021-03-14 ENCOUNTER — Encounter: Payer: Self-pay | Admitting: Cardiovascular Disease

## 2021-03-24 ENCOUNTER — Ambulatory Visit: Payer: 59 | Admitting: Surgery

## 2021-04-02 ENCOUNTER — Other Ambulatory Visit: Payer: Self-pay

## 2021-04-02 ENCOUNTER — Ambulatory Visit
Admission: RE | Admit: 2021-04-02 | Discharge: 2021-04-02 | Disposition: A | Discharge status: Home / Self Care | Payer: 59 | Source: Ambulatory Visit | Attending: Surgery | Admitting: Surgery

## 2021-04-02 DIAGNOSIS — Z981 Arthrodesis status: Secondary | ICD-10-CM

## 2021-04-02 DIAGNOSIS — M5412 Radiculopathy, cervical region: Secondary | ICD-10-CM

## 2021-04-02 IMAGING — MR MR CERVICAL SPINE W/O CM
4 of 6 series · 29 of 48 positions shown · non-contrast
Comparison: Radiography [DATE].  MRI [DATE].

CLINICAL DATA: Previous ACDF C5-6. Worsening neck pain and left arm
and hand pain.

EXAM:
MRI CERVICAL SPINE WITHOUT CONTRAST
TECHNIQUE: Multiplanar, multisequence MR imaging of the cervical spine was
performed. No intravenous contrast was administered.

[Series 3: T2 · sagittal · 3.0mm · 0.82mm/px · 7 of 18 slices shown (1 of 2)]
[im 1/18]
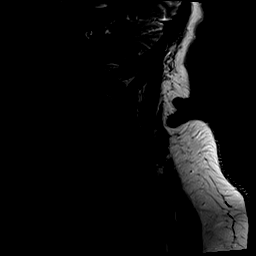
[im 3/18]
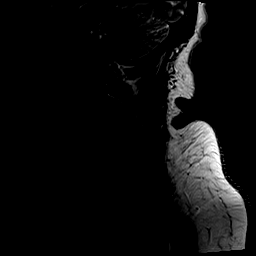
[im 6/18]
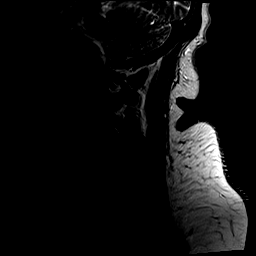
[im 9/18]
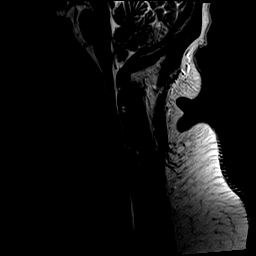
[im 12/18]
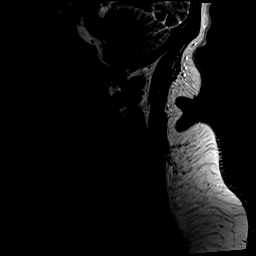
[im 15/18]
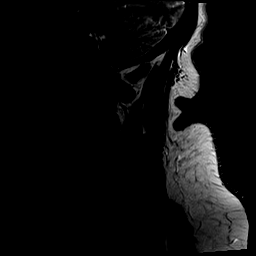
[im 18/18]
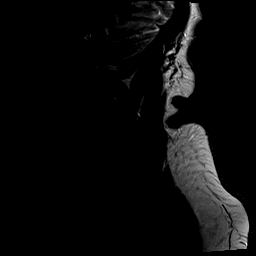

[Series 4: T1 · sagittal · 3.0mm · 0.41mm/px · 7 of 18 slices shown]
[im 1/18]
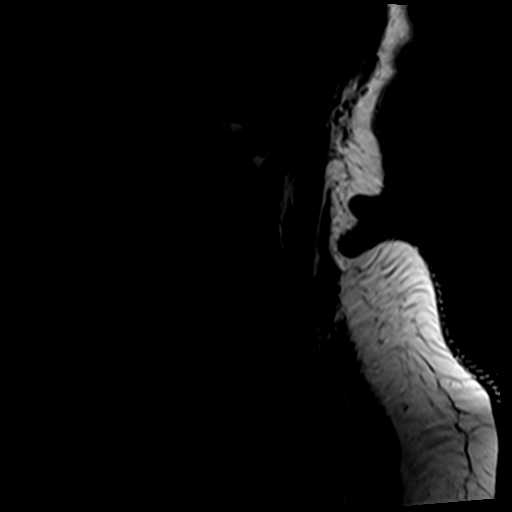
[im 3/18]
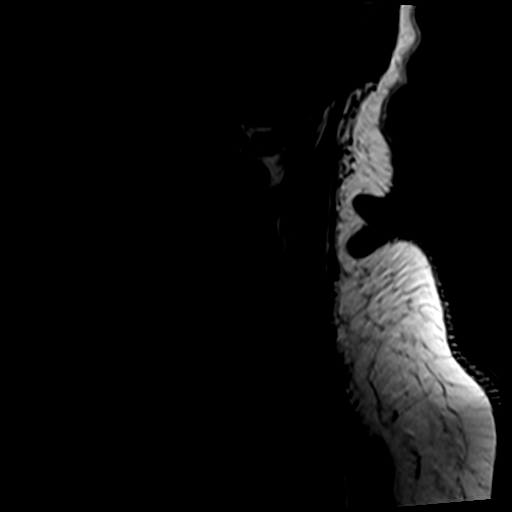
[im 6/18]
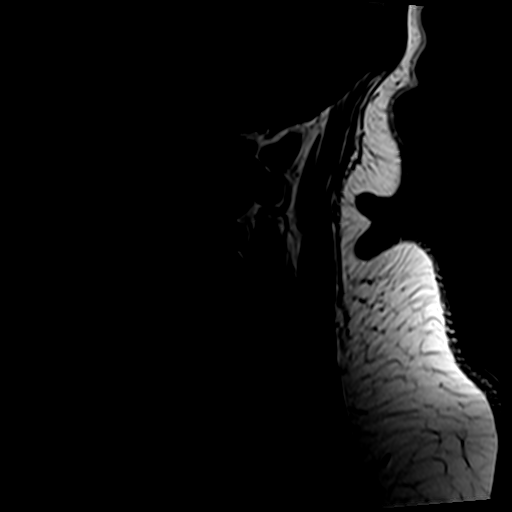
[im 9/18]
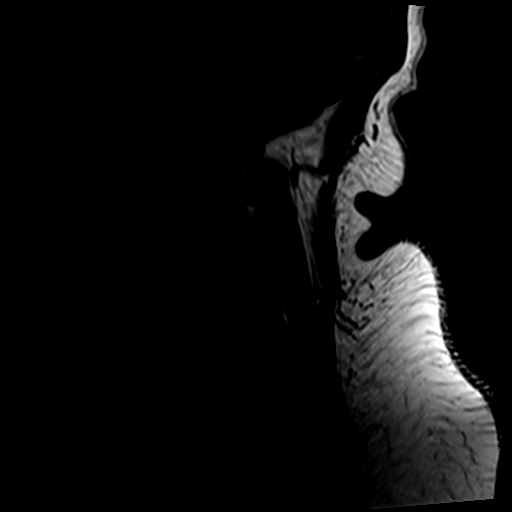
[im 12/18]
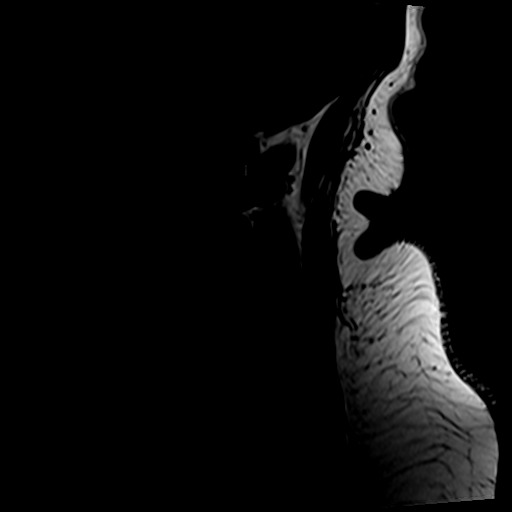
[im 15/18]
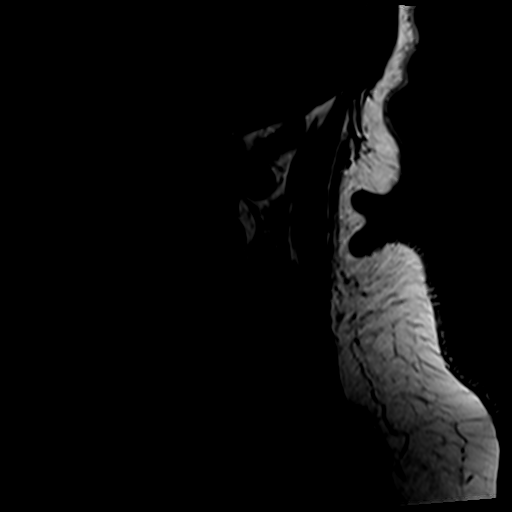
[im 18/18]
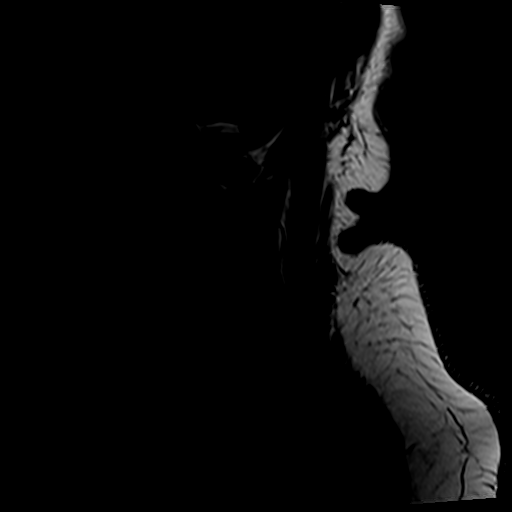

[Series 5: tir sag · sagittal · 3.0mm · 0.41mm/px · 7 of 18 slices shown]
[im 1/18]
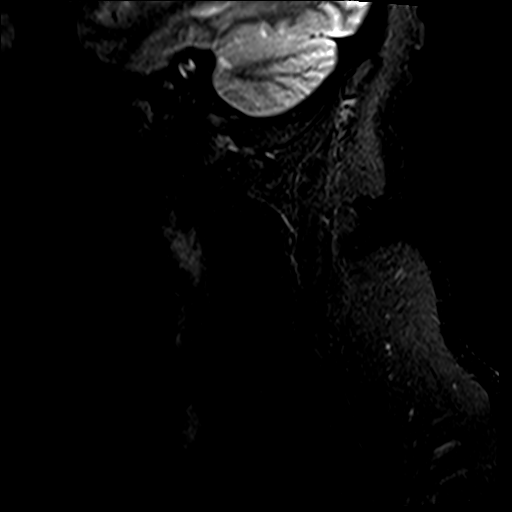
[im 3/18]
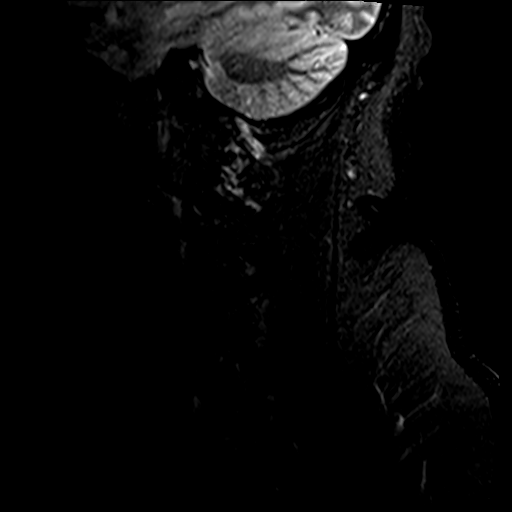
[im 6/18]
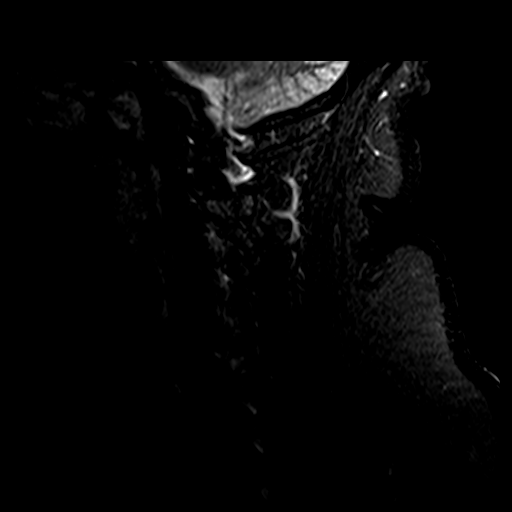
[im 9/18]
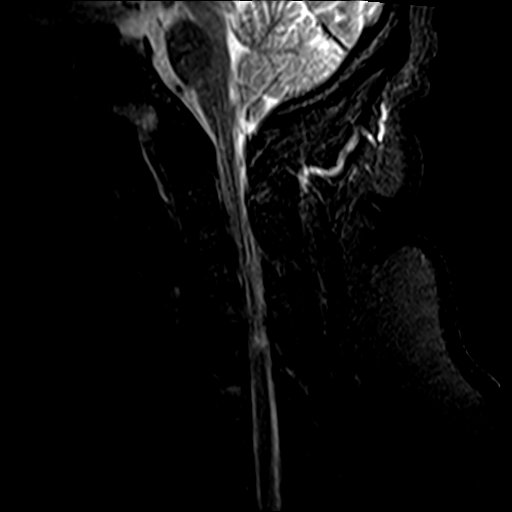
[im 12/18]
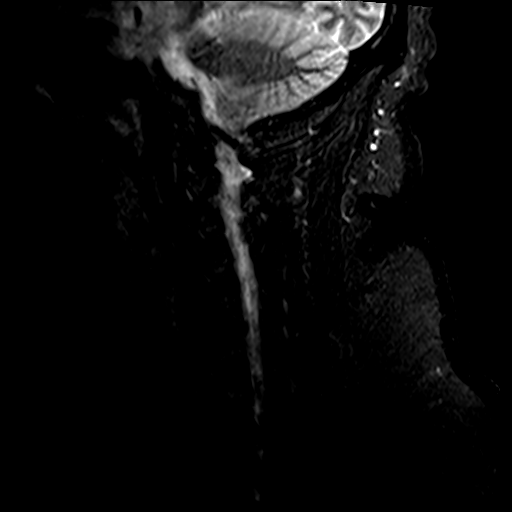
[im 15/18]
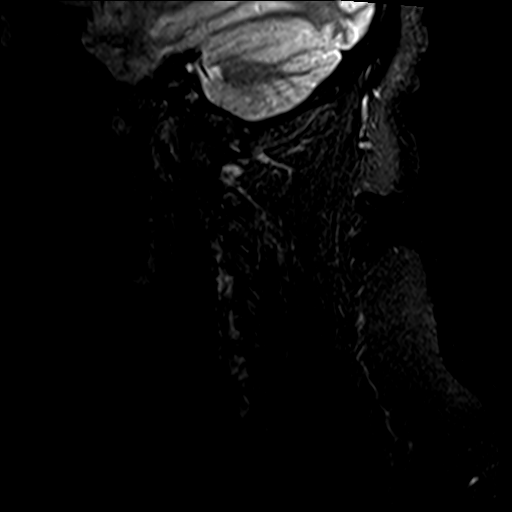
[im 18/18]
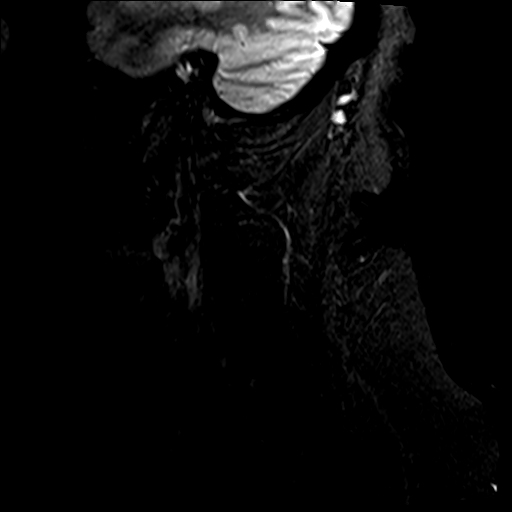

[Series 7: T2 · axial · 3.0mm · 0.70mm/px · z∈[-47,+51]mm · 8 of 27 slices shown (2 of 2)]
[im 1/27]
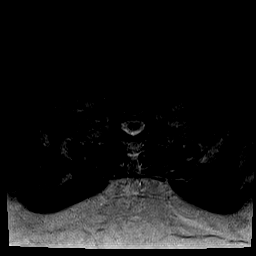
[im 3/27]
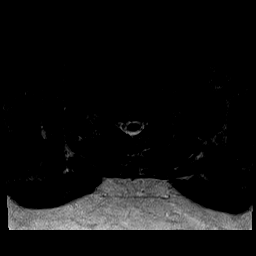
[im 9/27]
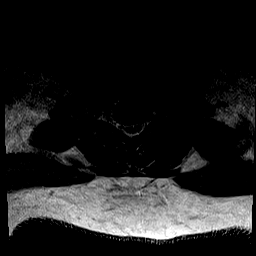
[im 12/27]
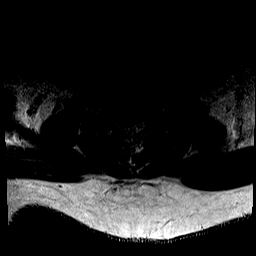
[im 15/27]
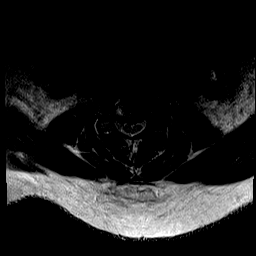
[im 18/27]
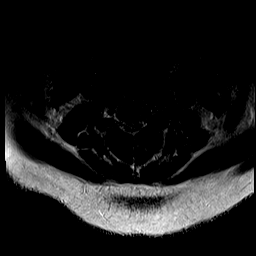
[im 24/27]
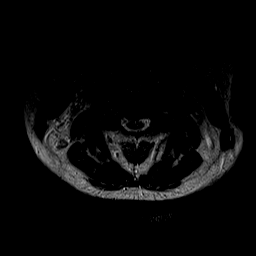
[im 27/27]
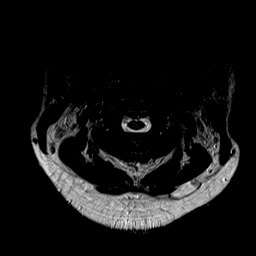

[29 of 48 positions shown; findings below may reference images not displayed]

FINDINGS: The study suffers from moderate motion degradation.

Alignment: Straightening of the normal cervical lordosis. No antero
or retrolisthesis.

Vertebrae: Previous ACDF C5-6.  Otherwise negative.

Cord: Cord detail is limited by motion. I think there may be some
cord gliosis at the C5-6 level, but detail is suboptimal.

Posterior Fossa, vertebral arteries, paraspinal tissues: Negative

Disc levels:

No abnormality at the foramen magnum, C1-2 or C2-3.

C3-4: Mild central bulging of the disc. Narrowing of the ventral
subarachnoid space but no apparent compression of cord. No foraminal
encroachment.

C4-5: Mild bulging of the disc.  No apparent compressive stenosis.

C5-6: Previous ACDF has a good appearance. Sufficient patency of the
canal and foramina.

C6-7: Mild bulging of the disc. No compressive narrowing of the
canal or foramina.

C7-T1: Normal interspace.
IMPRESSION: Motion degraded exam.

Previous ACDF C5-6. Apparent sufficient patency of the canal and
foramina. Probable mild gliotic change of the cord at this level
subsequent to the previous stenosis.

Mild, apparently non-compressive disc bulges at C3-4, C4-5 and C6-7.

## 2021-04-08 ENCOUNTER — Ambulatory Visit (INDEPENDENT_AMBULATORY_CARE_PROVIDER_SITE_OTHER): Payer: 59 | Admitting: Specialist

## 2021-04-08 ENCOUNTER — Other Ambulatory Visit: Payer: Self-pay

## 2021-04-08 ENCOUNTER — Ambulatory Visit: Payer: Self-pay

## 2021-04-08 ENCOUNTER — Encounter: Payer: Self-pay | Admitting: Specialist

## 2021-04-08 VITALS — BP 107/73 | HR 96 | Ht 68.0 in | Wt 307.0 lb

## 2021-04-08 DIAGNOSIS — R200 Anesthesia of skin: Secondary | ICD-10-CM | POA: Diagnosis not present

## 2021-04-08 DIAGNOSIS — R202 Paresthesia of skin: Secondary | ICD-10-CM

## 2021-04-08 DIAGNOSIS — M25511 Pain in right shoulder: Secondary | ICD-10-CM

## 2021-04-08 DIAGNOSIS — M4712 Other spondylosis with myelopathy, cervical region: Secondary | ICD-10-CM

## 2021-04-08 DIAGNOSIS — M7542 Impingement syndrome of left shoulder: Secondary | ICD-10-CM

## 2021-04-08 DIAGNOSIS — Z981 Arthrodesis status: Secondary | ICD-10-CM

## 2021-04-08 DIAGNOSIS — M7541 Impingement syndrome of right shoulder: Secondary | ICD-10-CM

## 2021-04-08 MED ORDER — GABAPENTIN 100 MG PO CAPS: 100.0000 mg | ORAL_CAPSULE | Freq: Every day | ORAL | 3 refills | Status: DC

## 2021-04-08 NOTE — Patient Instructions (Addendum)
Avoid overhead lifting and overhead use of the arms. Do not lift greater than 5 lbs. Adjust head rest in vehicle to prevent hyperextension if rear ended. Take extra precautions to avoid falling. Avoid overhead lifting and overhead use of the arms. Pillows to keep from sleeping directly on the shoulders Limited lifting to less than 10 lbs. Ice or heat for relief. NSAIDs are helpful, such as alleve or motrin, be careful not to use in excess as they place burdens on the kidney. Stretching exercise help and strengthening is helpful to build endurance. PT for shoulders and the arms to work on tendonitis and strengthening Neurology to assist in determining if there is other cause for bilateral arm symptoms than residual of disc HNP and myelopathy.

## 2021-04-08 NOTE — Progress Notes (Signed)
Office Visit Note   Patient: Monica Huang           Date of Birth: Jul 25, 1981           MRN: [id number] Visit Date: 04/08/2021              Requested by: Lavonda Jumbo, DO [address] [place],  [place] [address] PCP: Lavonda Jumbo, DO   Assessment & Plan: Visit Diagnoses:  1. Right shoulder pain, unspecified chronicity   2. S/P cervical spinal fusion   3. Other spondylosis with myelopathy, cervical region   4. Bilateral arm numbness and tingling while sleeping   5. Impingement syndrome of right shoulder   6. Impingement syndrome of left shoulder     Plan: Avoid overhead lifting and overhead use of the arms. Do not lift greater than 5 lbs. Adjust head rest in vehicle to prevent hyperextension if rear ended. Take extra precautions to avoid falling. Avoid overhead lifting and overhead use of the arms. Pillows to keep from sleeping directly on the shoulders Limited lifting to less than 10 lbs. Ice or heat for relief. NSAIDs are helpful, such as alleve or motrin, be careful not to use in excess as they place burdens on the kidney. Stretching exercise help and strengthening is helpful to build endurance. PT for shoulders and the arms to work on tendonitis and strengthening Neurology to assist in determining if there is other cause for bilateral arm symptoms than residual of disc HNP and myelopathy.   Follow-Up Instructions: Return in about 4 weeks (around 05/06/2021).   Orders:  Orders Placed This Encounter  Procedures  . XR Shoulder Right  . Ambulatory referral to Physical Therapy  . Ambulatory referral to Neurology   Meds ordered this encounter  Medications  . gabapentin (NEURONTIN) 100 MG capsule    Sig: Take 1 capsule (100 mg total) by mouth at bedtime.    Dispense:  90 capsule    Refill:  3      Procedures: No procedures performed   Clinical Data: No additional findings.   Subjective: Chief Complaint  Patient presents with  . Neck - Pain,  Follow-up    40 year old right handed female with history of pseudotumor cerebri and one year post C5-6 ACDF for myelopathy and large central disc herniation. SHe is having persistent pain in the neck laterally and into the shoulder left and right side. 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 She has blindness right eye 100 % and left eye is impaired as well. She is having persistent numbness and tingling into the hands and also right shoulder pain more than left. The right shoulder is painful with over head   Review of Systems   Objective: Vital Signs: BP 107/73   Pulse 96   Ht 5\' 8"  (1.727 m)   Wt (!) 307 lb (139.3 kg)   BMI 46.68 kg/m   Physical Exam  Ortho Exam  Specialty Comments:  No specialty comments available.  Imaging: No results found.   PMFS History: Patient Active Problem List   Diagnosis Date Noted  . Herniation of cervical intervertebral disc with radiculopathy 05/25/2020    Priority: High    Class: Chronic  . Spondylosis without myelopathy or radiculopathy, cervical region 05/25/2020    Priority: High    Class: Chronic  . Elevated blood pressure reading 10/26/2020  . Diabetes mellitus without complication (HCC)   . COVID-19 vaccine administered   . Vaginal itching 06/05/2020  .  S/P cervical spinal fusion 05/26/2020  . Status post cervical spinal fusion 05/25/2020  . Suspected sleep apnea 03/11/2020  . Palpitations 12/23/2019  . Chronic pain of both knees 10/23/2019  . Neck and shoulder pain 07/04/2019  . Close exposure to COVID-19 virus 06/25/2019  . Left knee pain 06/10/2019  . Viral upper respiratory infection 05/22/2019  . Cervical radiculitis 03/10/2019  . Prediabetes 02/06/2019  . Carpal tunnel syndrome 02/03/2019  . Vaginal discharge 07/05/2016  . Anxiety and depression 06/10/2016  . Other optic atrophy, 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  . Essential hypertension, benign 05/10/2015  . Dizziness 09/30/2014  . Mechanical  complication-ventricular(CSF) communicating shunt (HCC) 03/05/2013  . Hidradenitis suppurativa 10/15/2012  . Right hand pain 02/13/2012  . Tobacco abuse 01/03/2012  . Legally blind 01/03/2012  . Obesity 01/03/2012  . Marijuana smoker 01/03/2012   Past Medical History:  Diagnosis Date  . Ambulates with cane   . Chronic back pain   . Chronic leg pain   . Diabetes mellitus without complication (HCC)    Type II - no meds  . Dizziness   . Hydradenitis   . Hyperlipidemia    diet controlle - no meds  . Legally blind    uses cane - some limited vision  . Macular degeneration    patient denies this dx  (dr street dx)  . Pneumonia    x 1  . Pseudotumor cerebri syndrome 2005   shunt placed- and legally blind  . Sciatica   . Sleep apnea    "Mild" - has appt on 06/23/20 to see about cpap machine  . Wears partial dentures    upper and lower    Family History  Problem Relation Age of Onset  . Diabetes Mother   . Hypertension Mother   . Diabetes Father     Past Surgical History:  Procedure Laterality Date  . ANTERIOR CERVICAL DECOMP/DISCECTOMY FUSION N/A 05/25/2020   Procedure: ANTERIOR CERVICAL DISCECTOMY AND FUSION CERVICAL FIVE THROUGH CERVICAL SIX WITH PLATES, SCREWS, ALLOGRAFT AND LOCAL BONE GRAFT, VIVIGEN;  Surgeon: Kerrin Champagne, MD;  Location: [facility] OR;  Service: Orthopedics;  Laterality: N/A;  . CSF SHUNT     2 revisions  . MULTIPLE EXTRACTIONS WITH ALVEOLOPLASTY Bilateral 11/02/2017   Procedure: MULTIPLE EXTRACTION;  Surgeon: Ocie Doyne, DDS;  Location: [facility] OR;  Service: Oral Surgery;  Laterality: Bilateral;   Social History   Occupational History  . Not on file  Tobacco Use  . Smoking status: Current Every Day Smoker    Packs/day: 0.50    Years: 21.00    Pack years: 10.50    Types: Cigarettes  . Smokeless tobacco: Never Used  Vaping Use  . Vaping Use: Never used  Substance and Sexual Activity  . Alcohol use: Yes    Comment: occasional  . Drug use: Yes     Types: Marijuana    Comment: marijuana -- bag per day. - -4 joints/day  . Sexual activity: Yes    Birth control/protection: None    Comment: 1 partners

## 2021-04-13 ENCOUNTER — Telehealth: Payer: Self-pay | Admitting: Specialist

## 2021-04-13 NOTE — Telephone Encounter (Signed)
Patient called requesting a new work not be revised. Patient states she need her note to be based off visit from 04/08/21. Patient state she need detailed about mechanical issues and what her limitations are. Please call patient at [phone]. Please call patient for more details and call patient when ready.

## 2021-04-13 NOTE — Telephone Encounter (Signed)
Pt called and would like to know if Dr.Nitka could write her that out of work note. She tried working and its just too much for her.

## 2021-04-14 NOTE — Telephone Encounter (Signed)
sent note reuest to Dr. Otelia Sergeant, please see other message

## 2021-04-15 ENCOUNTER — Encounter: Payer: Self-pay | Admitting: Radiology

## 2021-04-15 NOTE — Telephone Encounter (Signed)
Pt called stating she called on 04/13/21 trying to get an out of work note for her job and has heard nothing back. Now today is her last day to get the note in and she only has until 4; pt would like to have this note left downstairs so she can pick it up please. Pt would like a CB   [phone]

## 2021-04-15 NOTE — Telephone Encounter (Signed)
I called and advised that I wrote a note with the limitations list in the OV note, and she is requesting to return to work on 04/18/21.  This was added and she is aware that her note is ready for pickup.

## 2021-04-26 ENCOUNTER — Other Ambulatory Visit: Payer: Self-pay

## 2021-04-26 ENCOUNTER — Ambulatory Visit: Payer: 59 | Attending: Specialist

## 2021-04-26 DIAGNOSIS — M25512 Pain in left shoulder: Secondary | ICD-10-CM | POA: Diagnosis present

## 2021-04-26 DIAGNOSIS — M6281 Muscle weakness (generalized): Secondary | ICD-10-CM | POA: Insufficient documentation

## 2021-04-26 DIAGNOSIS — R293 Abnormal posture: Secondary | ICD-10-CM | POA: Diagnosis present

## 2021-04-26 DIAGNOSIS — M542 Cervicalgia: Secondary | ICD-10-CM | POA: Diagnosis present

## 2021-04-26 DIAGNOSIS — G8929 Other chronic pain: Secondary | ICD-10-CM | POA: Diagnosis present

## 2021-04-26 DIAGNOSIS — M25511 Pain in right shoulder: Secondary | ICD-10-CM | POA: Diagnosis present

## 2021-04-26 NOTE — Patient Instructions (Signed)
Access Code: [email or web address] URL: [email or web address] Date: 04/26/2021 Prepared by: Gardiner Rhyme  Exercises Open Books - 2 x daily - 7 x weekly - 2 sets - 10 reps Chest Stretch on Foam 1/2 Roll Arms Extended - 1-2 x daily - 7 x weekly - 3 sets - 30 seconds hold Supine Shoulder Horizontal Abduction with Resistance - 1 x daily - 7 x weekly - 2 sets - 10 reps Supine Shoulder External Rotation with Resistance - 1 x daily - 7 x weekly - 2 sets - 10 reps

## 2021-04-28 NOTE — Therapy (Addendum)
[place], [place], [address] Phone: [phone]   Fax:  [phone]  Physical Therapy Evaluation/DISCHARGE SUMMARY  Patient Details  Name: Monica Huang MRN: [id number] Date of Birth: 09/07/1981 Referring Provider (PT): Jessy Oto, MD   Encounter Date: 04/26/2021   PT End of Session - 04/28/21 1441     Visit Number 1    Number of Visits 8    Date for PT Re-Evaluation 06/25/21    Authorization Type [employer] Medicare - [place] 6th and 10th visit, KX Modifier 15th visit    PT Start Time 1700    PT Stop Time 1745    PT Time Calculation (min) 45 min    Equipment Utilized During Treatment --   blind stick   Activity Tolerance Patient tolerated treatment well    Behavior During Therapy WFL for tasks assessed/performed             Past Medical History:  Diagnosis Date   Ambulates with cane    Chronic back pain    Chronic leg pain    Diabetes mellitus without complication (HCC)    Type II - no meds   Dizziness    Hydradenitis    Hyperlipidemia    diet controlle - no meds   Legally blind    uses cane - some limited vision   Macular degeneration    patient denies this dx  (dr street dx)   Pneumonia    x 1   Pseudotumor cerebri syndrome 2005   shunt placed- and legally blind   Sciatica    Sleep apnea    "Mild" - has appt on 06/23/20 to see about cpap machine   Wears partial dentures    upper and lower    Past Surgical History:  Procedure Laterality Date   ANTERIOR CERVICAL DECOMP/DISCECTOMY FUSION N/A 05/25/2020   Procedure: ANTERIOR CERVICAL DISCECTOMY AND FUSION CERVICAL FIVE THROUGH CERVICAL SIX WITH PLATES, SCREWS, ALLOGRAFT AND LOCAL BONE GRAFT, VIVIGEN;  Surgeon: Jessy Oto, MD;  Location: [facility];  Service: Orthopedics;  Laterality: N/A;   CSF SHUNT     2 revisions   MULTIPLE EXTRACTIONS WITH ALVEOLOPLASTY Bilateral 11/02/2017   Procedure: MULTIPLE EXTRACTION;  Surgeon: Diona Browner,  DDS;  Location: [facility];  Service: Oral Surgery;  Laterality: Bilateral;    There were no vitals filed for this visit.    Subjective Assessment - 04/28/21 1911     Subjective Pt reports she had surgery on her neck a year ago, and it's been aggravating ever since. She has not had physical therapy before or after surgery. She has pain in bilateral neck down arms and into hands with numbness in L>R hand. 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    Limitations House hold activities    Patient Stated Goals feeling better    Currently in Pain? Yes    Pain Score 6    10/10 at worst   Pain Location Neck    Pain Orientation Left;Right;Lateral    Pain Descriptors / Indicators Stabbing;Sore   aggravating   Pain Type Chronic pain    Pain Radiating Towards arms to hands    Pain Onset More than a month ago    Aggravating Factors  any movement    Pain Relieving Factors Meds    Effect of Pain on Daily Activities affects ability to work  [facility] PT Assessment - 04/28/21 0001       Assessment   Medical Diagnosis M25.511 (ICD-10-CM) - Right shoulder pain, unspecified chronicity  Z98.1 (ICD-10-CM) - S/P cervical spinal fusion  M47.12 (ICD-10-CM) - Other spondylosis with myelopathy, cervical region  M75.41 (ICD-10-CM) - Impingement syndrome of right shoulder  M75.42 (ICD-10-CM) - Impingement syndrome of left shoulder    Referring Provider (PT) Jessy Oto, MD    Hand Dominance Right    Next MD Visit needs to call and make it    Prior Therapy None      Precautions   Precautions None      Restrictions   Weight Bearing Restrictions No      Balance Screen   Has the patient fallen in the past 6 months No    Has the patient had a decrease in activity level because of a fear of falling?  --   off and on   Is the patient reluctant to leave their home because of a fear of falling?  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    Leisure hanging out with her sister      Observation/Other Assessments   Observations poor sitting posture, see below postural control    Focus on Therapeutic Outcomes (FOTO)  46% ability, 59% predicted      Posture/Postural Control   Posture/Postural Control Postural limitations    Postural Limitations Rounded Shoulders;Forward head;Increased thoracic kyphosis      ROM / Strength   AROM / PROM / Strength AROM;Strength      AROM   Overall AROM Comments painful in R shoulder    AROM Assessment Site Cervical    Cervical Flexion 50    Cervical Extension 45    Cervical - Right Side Bend 23    Cervical - Left Side Bend 24    Cervical - Right Rotation 62    Cervical - Left Rotation 55      Strength   Strength Assessment Site Shoulder    Right/Left Shoulder Right;Left    Right Shoulder Flexion 3+/5    Right Shoulder ABduction 3+/5    Right Shoulder Internal Rotation 4-/5    Right Shoulder External Rotation 4-/5    Left Shoulder Flexion 4-/5    Left Shoulder ABduction 4-/5    Left Shoulder Internal Rotation 4/5    Left Shoulder External Rotation 4-/5      Palpation   Palpation comment TTP B UT, LS, anterior R > L shoulder                        Objective measurements completed on examination: See above findings.                 PT Short Term Goals - 04/28/21 1907       PT SHORT TERM GOAL #1   Title Pt will be I and compliant with initial HEP.    Baseline provided at eval    Time 3    Period Weeks    Status New    Target Date 05/19/21               PT Long Term Goals - 04/28/21 1907       PT LONG TERM GOAL #1  Title Pt will be independent with advanced HEP for maintenance.    Time 8    Period Weeks    Status New    Target Date 06/23/21      PT LONG TERM  GOAL #2   Title Pt will increase shoulder MMT to at least 4+/5 B with </=3/10 report of pain.    Baseline see flowsheet    Time 8    Period Weeks    Status New    Target Date 06/23/21      PT LONG TERM GOAL #3   Title Pt will increase B shoulder ROM to [facility] without pain, as needed for I/ADLs and occupation.    Baseline painful with all shoulder AROM    Time 8    Period Weeks    Status New    Target Date 06/23/21      PT LONG TERM GOAL #4   Title Pt will increase FOTO ability to at least 59% ability, in order to demonstrate meaningful change in perceived level of functional ability.    Baseline 46% ability    Time 8    Period Weeks    Status New    Target Date 06/23/21      PT LONG TERM GOAL #5   Title Pt will report ability to work and perform tasks with B UE with </=4/10 pain.    Baseline 10/10 at worst    Time 8    Period Weeks    Status New    Target Date 06/23/21                    Plan - 04/28/21 1442     Clinical Impression Statement Pt is a 40 yo female who presents with neck and shoulder pain 1 year after ACDF with R>L shoulder and neck pain. 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 Pt is legally blind and her partner, Patrick Jupiter, accompanies her to session today. She demonstrates poor sitting posture with forward head, increased thoracic kyphosis, and forward rounding of shoulders. Impairments are present in posture, limited shoulder AROM into flexion due to pain, decreased MMT secondary to shoulder pain R>L, soft tissue imbalances in shoulder elevators, and numbness into B UE with foward positioning likely due to compression of brachial plexus. Pt was educated on FOTO, diagnosis, prognosis, HEP, and POC. She verbalized understanding and consent to tx. She would benefit from skilled PT 1x/week for 6-8 weeks to improve posture, decreased tone in upper traps and increase postural endurance and strength, as  well as decrease pain for functional activities in B UE as needed for work.    Personal Factors and Comorbidities Comorbidity 3+;Past/Current Experience;Profession;Fitness;Time since onset of injury/illness/exacerbation    Comorbidities DM, HLD, sleep apnea, legally blind    Examination-Activity Limitations Lift;Carry;Reach Overhead;Bend;Sleep;Hygiene/Grooming    Examination-Participation Restrictions Occupation;Cleaning;Laundry;Community Activity    Stability/Clinical Decision Making Evolving/Moderate complexity    Clinical Decision Making Moderate    Rehab Potential Good    PT Frequency 1x / week    PT Duration 8 weeks    PT Treatment/Interventions ADLs/Self Care Home Management;Electrical Stimulation;Cryotherapy;Iontophoresis 110m/ml Dexamethasone;Moist Heat;Traction;Therapeutic activities;Therapeutic exercise;Neuromuscular re-education;Patient/family education;Manual techniques;Passive range of motion;Dry needling;Taping;Vasopneumatic Device;Spinal Manipulations;Joint Manipulations    PT Next Visit Plan Assess response to HEP/update PRN, cervical alignment, thoracic mobility, DNF and periscapular strengthening    PT Home Exercise Plan [email or web address]    Consulted and Agree with Plan of Care Patient;Family member/caregiver  Patient will benefit from skilled therapeutic intervention in order to improve the following deficits and impairments:  Decreased range of motion,Increased fascial restricitons,Impaired tone,Impaired UE functional use,Decreased activity tolerance,Impaired perceived functional ability,Pain,Impaired flexibility,Improper body mechanics,Postural dysfunction,Decreased strength,Decreased mobility  Visit Diagnosis: Cervicalgia  Chronic left shoulder pain  Chronic right shoulder pain  Abnormal posture  Muscle weakness (generalized)     Problem List Patient Active Problem List   Diagnosis Date Noted   Elevated blood pressure reading 10/26/2020   Diabetes  mellitus without complication ([place])    OYWVX-42 vaccine administered    Vaginal itching 06/05/2020   S/P cervical spinal fusion 05/26/2020   Herniation of cervical intervertebral disc with radiculopathy 05/25/2020    Class: Chronic   Spondylosis without myelopathy or radiculopathy, cervical region 05/25/2020    Class: Chronic   Status post cervical spinal fusion 05/25/2020   Suspected sleep apnea 03/11/2020   Palpitations 12/23/2019   Chronic pain of both knees 10/23/2019   Neck and shoulder pain 07/04/2019   Close exposure to COVID-19 virus 06/25/2019   Left knee pain 06/10/2019   Viral upper respiratory infection 05/22/2019   Cervical radiculitis 03/10/2019   Prediabetes 02/06/2019   Carpal tunnel syndrome 02/03/2019   Vaginal discharge 07/05/2016   Anxiety and depression 06/10/2016   Other optic atrophy, 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   Essential hypertension, benign 05/10/2015   Dizziness 09/30/2014   Mechanical complication-ventricular(CSF) communicating shunt ([place]) 03/05/2013   Hidradenitis suppurativa 10/15/2012   Right hand pain 02/13/2012   Tobacco abuse 01/03/2012   Legally blind 01/03/2012   Obesity 01/03/2012   Marijuana smoker 01/03/2012    Izell Sanborn, PT 04/28/2021, 7:14 PM  [facility] [address] [place], [place], [address] Phone: [phone]   Fax:  [phone]  Name: Monica Huang MRN: [id number] Date of Birth: 11/18/1981  PHYSICAL THERAPY DISCHARGE SUMMARY  Visits from Start of Care: 1  Current functional level related to goals / functional outcomes: As at eval   Remaining deficits: As at eval   Education / Equipment: Initial HEP, see eval   Patient agrees to discharge. Patient goals were not met. Patient is being discharged due to the patient's request.  Pollyann Samples, PT

## 2021-05-03 ENCOUNTER — Telehealth: Payer: Self-pay

## 2021-05-03 ENCOUNTER — Ambulatory Visit: Payer: 59 | Attending: Specialist

## 2021-05-03 NOTE — Telephone Encounter (Signed)
Pt did not answer phone call. PT left a message stating the clinic's no-show policy and advising the pt to call the clinic to schedule her next appointment. The clinic phone number was provided.

## 2021-05-10 NOTE — Patient Instructions (Addendum)
It was wonderful to meet you today. Thank you for allowing me to be a part of your care. Below is a short summary of what we discussed at your visit today:  Hidradenitis suppurativa - Continue the clindamycin gel -Today we got labs (urine and blood) to test for conditions that make hidradenitis suppurativa worse. If the results are normal, I will send you a letter or MyChart message. If the results are abnormal, I will give you a call.   - Evidence is shown that weight loss and tobacco cessation raise full for reducing hidradenitis suppurativa outbreaks -Start doxycycline; week 1 take 1 pill twice daily, afterwards take 1 pill once daily -Follow-up in 2 to 3 months  Diabetes mellitus -Today we checked your A1c (average blood sugar) - I have referred you to ophthalmology for your annual diabetic eye exam.  Someone from their office should be calling you in 1 to 2 weeks to schedule an appointment.  If you do not hear from them, let us know.  Health maintenance Hepatitis C and HIV: We obtained a blood sample to screen for Hep. C and HIV today. We recommend every adult is screened for HIV and hepatitis at least once in a lifetime.  If the results are normal, I will send you a letter or MyChart message. If the results are abnormal, I will give you a call.    Pneumonia vaccine: Today you declined the pneumococcal pneumonia vaccine.  If you change your mind at any time, you may simply call the clinic and make a vaccine only appointment with a nurse at your convenience.  We recommend every adult if this vaccine, as it protects you against serious pneumonia with this bacteria in the future.  Please bring all of your medications to every appointment!  If you have any questions or concerns, please do not hesitate to contact us via phone or MyChart message.   Fayette Pho, MD

## 2021-05-10 NOTE — Progress Notes (Signed)
    SUBJECTIVE:   CHIEF COMPLAINT / HPI:   Hidradenitis suppurativa - Assess efficacy of clindamycin gel - Consider metformin versus Ozempic/Trulicity for glucose control and to help with weight - Could also consider a tetracycline such as 100 mg doxycycline 1 to twice daily x3 months - If this fails could do antiandrogenic agent such as spironolactone, combined OCP, finasteride (however would need confirmation of non-pregnant) -We will obtain CBC, lipid panel, A1c, urine pregnancy test (if considering spironolactone)  Diabetes - Obtain A1c, urine microalbumin/creatinine ratio -Referral to Ortho for routine eye exam  Health maintenance -HIV and hepatitis C screenings offered - Pneumococcal conjugate vaccine 20 Valent declined  PERTINENT  PMH / PSH: T2DM, HTN, obesity, hidradenitis suppurativa, tobacco use, marijuana use, spondylosis of cervical spine  OBJECTIVE:   BP 109/68   Pulse 87   Wt (!) 302 lb 6 oz (137.2 kg)   LMP 04/20/2021   SpO2 98%   BMI 45.98 kg/m   PHQ-9:  Depression screen [facility] 2/9 05/11/2021 10/25/2020 09/29/2020  Decreased Interest 1 0 0  Down, Depressed, Hopeless 0 0 0  PHQ - 2 Score 1 0 0  Altered sleeping 0 0 0  Tired, decreased energy 1 0 0  Change in appetite 0 0 0  Feeling bad or failure about yourself  0 0 0  Trouble concentrating 0 0 0  Moving slowly or fidgety/restless 0 0 0  Suicidal thoughts 0 0 0  PHQ-9 Score 2 0 0  Difficult doing work/chores - Not difficult at all -  Some recent data might be hidden     GAD-7: No flowsheet data found.   Physical Exam General: Awake, alert, oriented, no acute distress Respiratory: Unlabored respirations, speaking in full sentences, no respiratory distress Extremities: Painful erythematous boil of left ventral axilla without exudate or comedone, scarring also visible in left axilla  ASSESSMENT/PLAN:   Hidradenitis suppurativa Previously treated by dermatologist, was receiving clindamycin orally and  clindamycin gel.  Hidradenitis responded well to this dual therapy; however patient ran out of medication 2 months ago.  Not seeing this dermatologist anymore due to insurance issues.  Feels her boils have come back and are much worse after stopping medication.  Counseled patient on risk of C. difficile with clindamycin.   -Start doxycycline 100 mg twice daily x1 week (once daily thereafter), refilled clindamycin gel.   -Obtained labs to evaluate for contributory comorbid conditions (A1c, lipid panel) -Follow-up in 1 to 2 months.   -If doxycycline not effective, consider oral clindamycin.  Could also consider antiandrogen treatment such as spironolactone in the future.  Diabetes mellitus without complication (HCC) Has uncontrolled glucose can negatively affect hidradenitis, obtained A1c today. Attempted to obtain urine microalbumin/creatnine ratio; however patient unable to provide urine sample at this time.  Also noted patient was due for ophthalmology visit, sent referral today.  Healthcare maintenance Today obtained HIV and hepatitis C screenings with other blood work.  Referral for ophthalmology sent for diabetes eye exam.  Patient declined pneumococcal vaccine today.     Fayette Pho, MD [facility]

## 2021-05-11 ENCOUNTER — Other Ambulatory Visit: Payer: Self-pay

## 2021-05-11 ENCOUNTER — Ambulatory Visit (INDEPENDENT_AMBULATORY_CARE_PROVIDER_SITE_OTHER): Payer: 59 | Admitting: Family Medicine

## 2021-05-11 ENCOUNTER — Encounter: Payer: Self-pay | Admitting: Family Medicine

## 2021-05-11 ENCOUNTER — Ambulatory Visit: Payer: 59 | Admitting: Physical Therapy

## 2021-05-11 VITALS — BP 109/68 | HR 87 | Wt 302.4 lb

## 2021-05-11 DIAGNOSIS — E119 Type 2 diabetes mellitus without complications: Secondary | ICD-10-CM

## 2021-05-11 DIAGNOSIS — Z9189 Other specified personal risk factors, not elsewhere classified: Secondary | ICD-10-CM

## 2021-05-11 DIAGNOSIS — L732 Hidradenitis suppurativa: Secondary | ICD-10-CM

## 2021-05-11 DIAGNOSIS — Z1159 Encounter for screening for other viral diseases: Secondary | ICD-10-CM

## 2021-05-11 DIAGNOSIS — Z0000 Encounter for general adult medical examination without abnormal findings: Secondary | ICD-10-CM | POA: Diagnosis not present

## 2021-05-11 DIAGNOSIS — Z114 Encounter for screening for human immunodeficiency virus [HIV]: Secondary | ICD-10-CM

## 2021-05-11 MED ORDER — DOXYCYCLINE HYCLATE 100 MG PO TABS: ORAL_TABLET | ORAL | 0 refills | Status: DC

## 2021-05-11 MED ORDER — CLINDAMYCIN PHOSPHATE 1 % EX GEL: Freq: Two times a day (BID) | CUTANEOUS | 0 refills | Status: DC

## 2021-05-11 NOTE — Assessment & Plan Note (Signed)
Previously treated by dermatologist, was receiving clindamycin orally and clindamycin gel.  Hidradenitis responded well to this dual therapy; however patient ran out of medication 2 months ago.  Not seeing this dermatologist anymore due to insurance issues.  Feels her boils have come back and are much worse after stopping medication.  Counseled patient on risk of C. difficile with clindamycin.   -Start doxycycline 100 mg twice daily x1 week (once daily thereafter), refilled clindamycin gel.   -Obtained labs to evaluate for contributory comorbid conditions (A1c, lipid panel) -Follow-up in 1 to 2 months.   -If doxycycline not effective, consider oral clindamycin.  Could also consider antiandrogen treatment such as spironolactone in the future.

## 2021-05-11 NOTE — Assessment & Plan Note (Signed)
Today obtained HIV and hepatitis C screenings with other blood work.  Referral for ophthalmology sent for diabetes eye exam.  Patient declined pneumococcal vaccine today.

## 2021-05-11 NOTE — Assessment & Plan Note (Addendum)
Has uncontrolled glucose can negatively affect hidradenitis, obtained A1c today. Attempted to obtain urine microalbumin/creatnine ratio; however patient unable to provide urine sample at this time.  Also noted patient was due for ophthalmology visit, sent referral today.

## 2021-05-12 ENCOUNTER — Telehealth: Payer: Self-pay

## 2021-05-12 LAB — CBC
Hematocrit: 39.9 % (ref 34.0–46.6)
Hemoglobin: 13.7 g/dL (ref 11.1–15.9)
MCH: 32.5 pg (ref 26.6–33.0)
MCHC: 34.3 g/dL (ref 31.5–35.7)
MCV: 95 fL (ref 79–97)
Platelets: 289 10*3/uL (ref 150–450)
RBC: 4.22 x10E6/uL (ref 3.77–5.28)
RDW: 12.5 % (ref 11.7–15.4)
WBC: 8.5 10*3/uL (ref 3.4–10.8)

## 2021-05-12 LAB — LIPID PANEL
Chol/HDL Ratio: 3.6 ratio (ref 0.0–4.4)
Cholesterol, Total: 170 mg/dL (ref 100–199)
HDL: 47 mg/dL (ref >39)
LDL Chol Calc (NIH): 104 mg/dL — ABNORMAL HIGH (ref 0–99)
Triglycerides: 107 mg/dL (ref 0–149)
VLDL Cholesterol Cal: 19 mg/dL (ref 5–40)

## 2021-05-12 LAB — HEMOGLOBIN A1C
Est. average glucose Bld gHb Est-mCnc: 143 mg/dL
Hgb A1c MFr Bld: 6.6 % — ABNORMAL HIGH (ref 4.8–5.6)

## 2021-05-12 LAB — HCV AB W REFLEX TO QUANT PCR: HCV Ab: <0.1 s/co ratio (ref 0.0–0.9)

## 2021-05-12 LAB — HIV ANTIBODY (ROUTINE TESTING W REFLEX): HIV Screen 4th Generation wRfx: NONREACTIVE

## 2021-05-12 LAB — HCV INTERPRETATION

## 2021-05-12 NOTE — Telephone Encounter (Signed)
Patient returns call to nurse line regarding lab results. Informed of labs per result note. Patient has further question on diabetes management as A1c is still elevated.   Forwarding to PCP  Please advise.   Veronda Prude, RN

## 2021-05-16 ENCOUNTER — Ambulatory Visit (INDEPENDENT_AMBULATORY_CARE_PROVIDER_SITE_OTHER): Payer: 59 | Admitting: Pharmacist

## 2021-05-16 ENCOUNTER — Other Ambulatory Visit: Payer: Self-pay

## 2021-05-16 DIAGNOSIS — E119 Type 2 diabetes mellitus without complications: Secondary | ICD-10-CM | POA: Diagnosis not present

## 2021-05-16 MED ORDER — OZEMPIC (0.25 OR 0.5 MG/DOSE) 2 MG/1.5ML ~~LOC~~ SOPN
0.2500 mg | PEN_INJECTOR | SUBCUTANEOUS | 0 refills | Status: DC
Start: 2021-05-16 — End: 2021-06-15

## 2021-05-16 NOTE — Patient Instructions (Signed)
Monica Huang it was a pleasure seeing you today.   Please do the following:  Start Ozempic 0.25mg  as directed today during your appointment. If you have any questions or if you believe something has occurred because of this change, call me or your doctor to let one of [place] know.  Continue checking blood sugars at home. It's really important that you record these and bring these in to your next doctor's appointment.  Continue making the lifestyle changes we've discussed together during our visit. Diet and exercise play a significant role in improving your blood sugars.  Follow-up with me in four weeks.    Hypoglycemia or low blood sugar:   Low blood sugar can happen quickly and may become an emergency if not treated right away.   While this shouldn't happen often, it can be brought upon if you skip a meal or do not eat enough. Also, if your insulin or other diabetes medications are dosed too high, this can cause your blood sugar to go to low.   Warning signs of low blood sugar include: Feeling shaky or dizzy Feeling weak or tired  Excessive hunger Feeling anxious or upset  Sweating even when you aren't exercising  What to do if I experience low blood sugar? Follow the Rule of 15 Check your blood sugar with your meter. If lower than 70, proceed to step 2.  Treat with 15 grams of fast acting carbs which is found in 3-4 glucose tablets. If none are available you can try hard candy, 1 tablespoon of sugar or honey,4 ounces of fruit juice, or 6 ounces of REGULAR soda.  Re-check your sugar in 15 minutes. If it is still below 70, do what you did in step 2 again. If your blood sugar has come back up, go ahead and eat a snack or small meal made up of complex carbs (ex. Whole grains) and protein at this time to avoid recurrence of low blood sugar.

## 2021-05-16 NOTE — Progress Notes (Signed)
   Subjective:    Patient ID: Monica Huang, female    DOB: [birth year], 40 y.o.   MRN: [id number]  HPI Patient is a 40 y.o. female who presents for initiation and demonstration of GLP-1. She is in good spirits and presents with assistance using walking stick and help of family member. Patient was referred and last seen by Primary Care Provider on 05/11/21.  Patient reports diabetes was diagnosed in 2014.   Insurance coverage/medication affordability: Medicare/Medicaid  Current diabetes medications include: None Patient states that She is taking her  medications as prescribed. Patient reports adherence with medications.   Objective:   Labs:   Lab Results  Component Value Date   HGBA1C 6.6 (H) 05/11/2021   HGBA1C 6.5 (A) 09/29/2020   HGBA1C 6.6 (H) 05/19/2020    Lab Results  Component Value Date   MICRALBCREAT 1 04/29/2019    Lipid Panel     Component Value Date/Time   CHOL 170 05/11/2021 1003   TRIG 107 05/11/2021 1003   HDL 47 05/11/2021 1003   CHOLHDL 3.6 05/11/2021 1003   CHOLHDL 3.3 03/11/2015 0841   VLDL 21 03/11/2015 0841   LDLCALC 104 (H) 05/11/2021 1003   LDLDIRECT 100 (H) 01/01/2012 1155    Clinical Atherosclerotic Cardiovascular Disease (ASCVD):  No The ASCVD Risk score Denman George DC Jr., et al., 2013) failed to calculate for the following reasons:   The 2013 ASCVD risk score is only valid for ages 77 to 43    Assessment/Plan:   T2DM is controlled likely due to lifestyle modifications. Medication adherence appears optimal. Discussed with patient pros/cons of Trulicity and Ozempic as both are covered on patient's formulary. Discussed with patient that Trulicity may be easier for her to administer but patient chose Ozempic due to higher likelihood of weight loss benefits. Patient educated on purpose, proper use and potential adverse effects of Ozempic. Utilized [employer] and taught patient and family member how to administer. Patient demonstrated proper  administration technique. Following instruction patient verbalized understanding of treatment plan.    Started GLP-1 semaglutide (Ozempic) 0.25mg  once weekly. Counseled on s/sx of and management of hypoglycemia Next A1C anticipated September 2022.   Follow-up appointment in four weeks to review tolerance to medication and weight management. Written patient instructions provided.  This appointment required 30 minutes of patient care (this includes precharting, chart review, review of results, and face-to-face care).  Thank you for involving pharmacy to assist in providing this patient's care.

## 2021-05-18 ENCOUNTER — Ambulatory Visit: Payer: 59 | Admitting: Physical Therapy

## 2021-05-23 ENCOUNTER — Ambulatory Visit: Payer: 59 | Admitting: Family Medicine

## 2021-05-25 ENCOUNTER — Encounter: Payer: 59 | Admitting: Physical Therapy

## 2021-05-26 ENCOUNTER — Ambulatory Visit: Payer: 59 | Admitting: Neurology

## 2021-06-01 ENCOUNTER — Encounter: Payer: 59 | Admitting: Physical Therapy

## 2021-06-02 ENCOUNTER — Telehealth: Payer: Self-pay | Admitting: Physician Assistant

## 2021-06-02 ENCOUNTER — Telehealth: Payer: Self-pay

## 2021-06-02 NOTE — Telephone Encounter (Signed)
Patient calls nurse line regarding medication issues. Patient reports that doxycycline is not working for her and would like to be on previously prescribed antibiotic, doxycycline. Patient attempted to scheduled appointment, however, we are completely booked until Tuesday of next week.   Please advise.   Veronda Prude, RN

## 2021-06-02 NOTE — Telephone Encounter (Signed)
Her symptom complex does not sound ischemic and may be musculoskeletal.  Keep [place] informed if recurrent symptoms develop.  Otherwise keep scheduled appointment

## 2021-06-02 NOTE — Telephone Encounter (Signed)
Pt c/o of Chest Pain: STAT if CP now or developed within 24 hours  1. Are you having CP right now? Yes, states it is discomfort not pain   2. Are you experiencing any other symptoms (ex. SOB, nausea, vomiting, sweating)? Nausea, not having right now   3. How long have you been experiencing CP? Started last night on right side hit so hard she felt it all the way to her hip   4. Is your CP continuous or coming and going? Coming and going  5. Have you taken Nitroglycerin? No  ?

## 2021-06-02 NOTE — Telephone Encounter (Signed)
Spoke with pt, she reports while sitting on the toilet she developed a sharp pain in her right chest that she also felt in her hip. It was a shooting pain that went away after getting up.  Today she has a tight feeling in her left chest that comes and goes. Thee is nothing that makes the tightness better or worse. She took ibuprofen today and it did not help with the tightness. She has never had this type of pain before. She reports she has never seen any other provider in the office besides dr Tresa Endo, she did not know dr berry. She was made a follow up appointment in sept. Will forward to dr Tresa Endo to review.

## 2021-06-03 ENCOUNTER — Other Ambulatory Visit: Payer: Self-pay | Admitting: Family Medicine

## 2021-06-03 DIAGNOSIS — L732 Hidradenitis suppurativa: Secondary | ICD-10-CM

## 2021-06-03 MED ORDER — CLINDAMYCIN HCL 300 MG PO CAPS: 300.0000 mg | ORAL_CAPSULE | Freq: Two times a day (BID) | ORAL | 0 refills | Status: AC

## 2021-06-03 MED ORDER — CLINDAMYCIN PHOSPHATE 1 % EX GEL: Freq: Two times a day (BID) | CUTANEOUS | 0 refills | Status: DC

## 2021-06-03 NOTE — Progress Notes (Signed)
Per patient, doxycycline oral regimen not working well for hidradenitis suppurativa. Patient was previously treated by dermatology for this with dual clindamycin therapy: both topical and oral. At last appointment, discussed risks of oral clindamycin for developing a C. Difficile infection. Chose doxycycline as agent at that time.   Given patient's desire to switch back to clindamycin, will provide one month supply. Would like to avoid long term use of this antibiotic because of C. Diff risk. Will ask her to make an appointment with the dermatology clinic here at [facility] to discuss other agents such as spironolactone and metformin.   Fayette Pho, MD

## 2021-06-03 NOTE — Telephone Encounter (Signed)
Yes, I'm sorry it was supposed to be clindamycin.   Thanks,   Veronda Prude, RN

## 2021-06-03 NOTE — Telephone Encounter (Signed)
Spoke with pt, aware of dr kelly's recommendations. 

## 2021-06-04 NOTE — Telephone Encounter (Signed)
I have sent in a script for both oral and topical clindamycin, as this was her previous regimen that worked well. I have asked her to make an appointment with our dermatology clinic held on Thursdays in order to establish a safer long-term treatment plan, given risk of C. Diff with oral clindamycin.   Fayette Pho, MD

## 2021-06-06 ENCOUNTER — Ambulatory Visit: Payer: 59 | Admitting: Physician Assistant

## 2021-06-08 ENCOUNTER — Encounter: Payer: 59 | Admitting: Physical Therapy

## 2021-06-08 ENCOUNTER — Telehealth: Payer: Self-pay

## 2021-06-08 ENCOUNTER — Other Ambulatory Visit: Payer: Self-pay | Admitting: Specialist

## 2021-06-08 MED ORDER — METHOCARBAMOL 500 MG PO TABS: 500.0000 mg | ORAL_TABLET | Freq: Three times a day (TID) | ORAL | 0 refills | Status: DC | PRN

## 2021-06-08 MED ORDER — IBUPROFEN 800 MG PO TABS: 800.0000 mg | ORAL_TABLET | Freq: Three times a day (TID) | ORAL | 1 refills | Status: DC | PRN

## 2021-06-08 MED ORDER — GABAPENTIN 100 MG PO CAPS: 100.0000 mg | ORAL_CAPSULE | Freq: Every day | ORAL | 3 refills | Status: DC

## 2021-06-08 NOTE — Telephone Encounter (Signed)
Patient would like a Rx for pain sent to her pharmacy.  Cb# [phone].  Please advise.  Thank you.

## 2021-06-08 NOTE — Telephone Encounter (Signed)
See message below °

## 2021-06-13 ENCOUNTER — Telehealth: Payer: Self-pay | Admitting: *Deleted

## 2021-06-13 NOTE — Telephone Encounter (Signed)
Patient would like to know if Wednesdays visit can be a video visit, because she has to work. She will be able to step off the floor and do the video visit.  To Dr. Nicholaus Bloom to Advise. Jone Baseman, CMA

## 2021-06-15 ENCOUNTER — Other Ambulatory Visit: Payer: Self-pay | Admitting: Family Medicine

## 2021-06-15 ENCOUNTER — Encounter: Payer: 59 | Admitting: Physical Therapy

## 2021-06-15 ENCOUNTER — Telehealth (INDEPENDENT_AMBULATORY_CARE_PROVIDER_SITE_OTHER): Payer: 59 | Admitting: Pharmacist

## 2021-06-15 DIAGNOSIS — Z6841 Body Mass Index (BMI) 40.0 and over, adult: Secondary | ICD-10-CM

## 2021-06-15 DIAGNOSIS — E119 Type 2 diabetes mellitus without complications: Secondary | ICD-10-CM

## 2021-06-15 MED ORDER — OZEMPIC (0.25 OR 0.5 MG/DOSE) 2 MG/1.5ML ~~LOC~~ SOPN: 0.5000 mg | PEN_INJECTOR | SUBCUTANEOUS | 0 refills | Status: DC

## 2021-06-15 NOTE — Progress Notes (Signed)
   Subjective:    Patient ID: Monica Huang, female    DOB: 03-Dec-1980, 40 y.o.   MRN: [id number]  HPI Patient is a 40 y.o. female who presents for follow-up of GLP-1 initiation. She is in good spirits and presents via MyChart Video visit for appointment. Patient unable to connect to video visit therefore visit conducted via telephone. Patient was referred and last seen by Primary Care Provider on 05/11/21. Last seen in pharmacy clinic 05/16/21.   Patient reports that she had some cramps in her foot a few weeks ago and wasn't sure if it was the Ozempic or not. Patient has completed 5 doses of Ozempic 0.25mg  with no reported side effects. She states she has yet to notice any weight loss but "I know I am on the lowest dose."  Patient reports diabetes was diagnosed in 2014.   Insurance coverage/medication affordability: Medicare/Medicaid  Current diabetes medications include: None Patient states that She is taking her  medications as prescribed. Patient reports adherence with medications.   Objective:   Review of Systems  Gastrointestinal:  Negative for abdominal pain, nausea and vomiting.    Labs:   Lab Results  Component Value Date   HGBA1C 6.6 (H) 05/11/2021   HGBA1C 6.5 (A) 09/29/2020   HGBA1C 6.6 (H) 05/19/2020    Lab Results  Component Value Date   MICRALBCREAT 1 04/29/2019    Lipid Panel     Component Value Date/Time   CHOL 170 05/11/2021 1003   TRIG 107 05/11/2021 1003   HDL 47 05/11/2021 1003   CHOLHDL 3.6 05/11/2021 1003   CHOLHDL 3.3 03/11/2015 0841   VLDL 21 03/11/2015 0841   LDLCALC 104 (H) 05/11/2021 1003   LDLDIRECT 100 (H) 01/01/2012 1155    Clinical Atherosclerotic Cardiovascular Disease (ASCVD):  No The ASCVD Risk score Denman George DC Jr., et al., 2013) failed to calculate for the following reasons:   The 2013 ASCVD risk score is only valid for ages 73 to 63    Assessment/Plan:   T2DM is controlled likely due to lifestyle modifications. Medication  adherence appears optimal. Patient currently taking Ozempic for weight loss benefits. Unable to assess weight loss as patient presented via telephone. At follow-up visit will have patient present in person to be able to obtain updated weight. Patient experienced no side effects after 5 doses of medication therefore will increase dose from 0.25mg  once weekly to 0.5mg  once weekly starting this week. Patient demonstrated proper administration technique. Following instruction patient verbalized understanding of treatment plan.    Increased dose of GLP-1 semaglutide (Ozempic) to 0.5mg  once weekly on Sundays. Counseled on s/sx of and management of hypoglycemia Next A1C anticipated September 2022.   Follow-up appointment in four weeks to review tolerance to medication and weight management. Written patient instructions provided.  This appointment required 30 minutes of patient care (this includes precharting, chart review, review of results, and telephonic care).  Thank you for involving pharmacy to assist in providing this patient's care.

## 2021-06-28 ENCOUNTER — Ambulatory Visit: Payer: 59 | Admitting: Diagnostic Neuroimaging

## 2021-06-30 ENCOUNTER — Ambulatory Visit (INDEPENDENT_AMBULATORY_CARE_PROVIDER_SITE_OTHER): Payer: 59 | Admitting: Family Medicine

## 2021-06-30 ENCOUNTER — Other Ambulatory Visit: Payer: Self-pay

## 2021-06-30 VITALS — BP 126/80 | HR 91 | Ht 69.0 in | Wt 302.8 lb

## 2021-06-30 DIAGNOSIS — L989 Disorder of the skin and subcutaneous tissue, unspecified: Secondary | ICD-10-CM | POA: Diagnosis not present

## 2021-06-30 DIAGNOSIS — L732 Hidradenitis suppurativa: Secondary | ICD-10-CM

## 2021-06-30 MED ORDER — TRIAMCINOLONE ACETONIDE 0.1 % EX OINT: 1.0000 "application " | TOPICAL_OINTMENT | Freq: Two times a day (BID) | CUTANEOUS | 0 refills | Status: DC

## 2021-06-30 NOTE — Assessment & Plan Note (Signed)
Skin lesion of chest wall x1 week duration.  Does not appear consistent with ringworm or other infectious etiology.  Seems to be more consistent with a contact dermatitis picture, though unsure of possible source.  Will trial course with topical steroids for improvement. - Return precautions given

## 2021-06-30 NOTE — Assessment & Plan Note (Signed)
Patient currently has outbreak of HS, area on buttock does not appear that it would have much benefit from drainage at this time.  Patient agreeable to referral to general surgery for possible gland removal if appropriate.  Discussed that if general surgery is unable to assist the patient we would consider doing another round of clindamycin and rifampin (typical course is clindamycin 300 mg twice daily and rifampin 300 mg twice daily x10 to 12 weeks). - Referral to general surgery placed - Consider treatment clindamycin and rifampin if unable to have surgical treatment

## 2021-06-30 NOTE — Patient Instructions (Signed)
We have sent in a referral to general surgery to see if they can help with your hidradenitis suppurativa. If they are unable to offer you any treatment options, please let our office know and we can send in the Rifampin and Clindamycin for you at that time.    For your skin lesion on your chest, it does not appear to be a ringworm and may be a local skin reaction to specific material. We are sending in a steroid cream you can use twice daily and see if that improves the area.

## 2021-06-30 NOTE — Progress Notes (Signed)
    SUBJECTIVE:   CHIEF COMPLAINT / HPI:   Hidradenitis suppurativa Patient reports that she has multiple areas of outbreaks that occur.  She persistently has moments up in the axilla, on her thighs and she currently has 1 on her buttock as it is causing an issue.  She does note that she previously was on clindamycin and rifampin that cleared her outbreaks significantly while on it, she states she has not been on since January.  Patient does report that the lesions do come back after finishing the antibiotic courses.  She is open to the possibility of surgery.  Skin lesion on chest Patient also note about a week or so ago she noticed a round lesion on her chest.  She says other people have told her it looks like ringworm.  She does not have any other lesions and has never had this before.  Does not feel that it has changed much in its size since she first noticed it, but is unsure of exactly how long it has been there.  Denies pruritus or pain in the area.  PERTINENT  PMH / PSH: Reviewed  OBJECTIVE:   BP 126/80   Pulse 91   Ht 5\' 9"  (1.753 m)   Wt (!) 302 lb 12.8 oz (137.3 kg)   SpO2 100%   BMI 44.72 kg/m   General: NAD, well-appearing, well-nourished Respiratory: No respiratory distress, breathing comfortably, able to speak in full sentences Skin: warm and dry, outbreak of HS noted on left buttocks with minimal drainage, area mildly tender to palpation directly over lesion, no significant tension appreciated in the area.  Psych: Appropriate affect and mood  ASSESSMENT/PLAN:   Hidradenitis suppurativa Patient currently has outbreak of HS, area on buttock does not appear that it would have much benefit from drainage at this time.  Patient agreeable to referral to general surgery for possible gland removal if appropriate.  Discussed that if general surgery is unable to assist the patient we would consider doing another round of clindamycin and rifampin (typical course is clindamycin 300  mg twice daily and rifampin 300 mg twice daily x10 to 12 weeks). - Referral to general surgery placed - Consider treatment clindamycin and rifampin if unable to have surgical treatment  Skin lesion of chest wall Skin lesion of chest wall x1 week duration.  Does not appear consistent with ringworm or other infectious etiology.  Seems to be more consistent with a contact dermatitis picture, though unsure of possible source.  Will trial course with topical steroids for improvement. - Return precautions given     , DO [facility]

## 2021-07-01 ENCOUNTER — Ambulatory Visit: Payer: 59 | Admitting: Neurology

## 2021-07-05 ENCOUNTER — Other Ambulatory Visit: Payer: Self-pay | Admitting: Family Medicine

## 2021-07-05 ENCOUNTER — Telehealth: Payer: Self-pay

## 2021-07-05 DIAGNOSIS — L732 Hidradenitis suppurativa: Secondary | ICD-10-CM

## 2021-07-05 NOTE — Telephone Encounter (Signed)
Patient calls nurse line in response to appointment on Thursday, 8/4.   Patient is requesting to start Rifampin, due to not knowing how long surgical option would take.   Will forward to provider.   Veronda Prude, RN

## 2021-07-07 MED ORDER — RIFAMPIN 300 MG PO CAPS: 300.0000 mg | ORAL_CAPSULE | Freq: Two times a day (BID) | ORAL | 2 refills | Status: DC

## 2021-07-08 ENCOUNTER — Other Ambulatory Visit: Payer: Self-pay | Admitting: Family Medicine

## 2021-07-08 DIAGNOSIS — E119 Type 2 diabetes mellitus without complications: Secondary | ICD-10-CM

## 2021-07-18 ENCOUNTER — Other Ambulatory Visit: Payer: Self-pay

## 2021-07-18 ENCOUNTER — Ambulatory Visit (INDEPENDENT_AMBULATORY_CARE_PROVIDER_SITE_OTHER): Payer: 59 | Admitting: Pharmacist

## 2021-07-18 DIAGNOSIS — Z6841 Body Mass Index (BMI) 40.0 and over, adult: Secondary | ICD-10-CM | POA: Diagnosis not present

## 2021-07-18 NOTE — Progress Notes (Signed)
   Subjective:    Patient ID: Monica Huang, female    DOB: 26-Sep-1981, 40 y.o.   MRN: [id number]  HPI Patient is a 40 y.o. female who presents for 2 month weight management follow up. She is in good spirits and presents with assistance of white cane and help of family member. She initiated use of Ozempic 2 months ago. Denies any undesirable side effects and reports compliance with medications.   Patient increased Ozempic to 0.5mg  and completed four doses since we last spoke.  Current meal patterns: Breakfast: skips Lunch: sandwich or sub Supper: skips mostly Snacks: ice cream, chips Drinks:coffee (sweetened); water; soda on occasion Current exercise: denies Previously tried weight management pharmacotherapy: none Current weight management pharmacotherapy: Ozempic 0.5mg  once weekly Patient states that She is taking her medications as prescribed. Patient reports adherence with medications.   Do you feel that your medications are working for you?  yes  Have you been experiencing any side effects to the medications prescribed? no  Do you have any problems obtaining medications due to transportation or finances?  no     Objective:   Vitals with BMI 06/30/2021 05/11/2021 04/08/2021  Height 5\' 9"  - 5\' 8"   Weight 302 lbs 13 oz 302 lbs 6 oz 307 lbs  BMI 17.4 - 94.49  Systolic 675 916 384  Diastolic 80 68 73  Pulse 91 87 96     Lab Results  Component Value Date   HGBA1C 6.6 (H) 05/11/2021   HGBA1C 6.5 (A) 09/29/2020   HGBA1C 6.6 (H) 05/19/2020    Assessment/Plan:   Patient has not met goal of at least 5% of body weight loss with comprehensive lifestyle modifications alone in the past 3-6 months. Pharmacotherapy is appropriate to pursue as augmentation. Will titrate up patient's Ozempic to 1mg . Patient educated on purpose, proper use and potential adverse effects of Ozempic.  Following instruction patient verbalized understanding of treatment plan.    Increase Ozempic from 0.5mg   once weekly to 1mg  once weekly. Patient will begin taking 2 doses of Ozempic 0.5mg  to make 1mg  dose. Comprehensive medication review performed, medication list updated in electronic medical record Patient will adhere to dietary modifications Patient will report any questions or concerns to provider

## 2021-07-18 NOTE — Patient Instructions (Signed)
Monica Huang it was a pleasure seeing you today!  Today we discussed a plan for weight management to work towards your goal of 40 lbs.  INCREASE Ozempic to 2 doses of 0.5mg  on Sundays Exercise goal: chair exercises or light weights Diet goal: work towards minimizing ice cream and chips   Follow-up with in one month  If you have any questions or concerns before your next appointment please call the clinic

## 2021-07-19 NOTE — Assessment & Plan Note (Signed)
Patient has not met goal of at least 5% of body weight loss with comprehensive lifestyle modifications alone in the past 3-6 months. Pharmacotherapy is appropriate to pursue as augmentation. Will titrate up patient's Ozempic to 44m. Patient educated on purpose, proper use and potential adverse effects of Ozempic.  Following instruction patient verbalized understanding of treatment plan.    . Increase Ozempic from 0.547monce weekly to 68m14mnce weekly. Patient will begin taking 2 doses of Ozempic 0.5mg88m make 68mg 36me. . Comprehensive medication review performed, medication list updated in electronic medical record . Patient will adhere to dietary modifications . Patient will report any questions or concerns to provider

## 2021-07-28 ENCOUNTER — Ambulatory Visit: Payer: 59 | Admitting: Physician Assistant

## 2021-08-17 ENCOUNTER — Encounter: Payer: Self-pay | Admitting: Family Medicine

## 2021-08-17 ENCOUNTER — Telehealth: Payer: Self-pay | Admitting: Family Medicine

## 2021-08-17 ENCOUNTER — Other Ambulatory Visit: Payer: Self-pay

## 2021-08-17 ENCOUNTER — Ambulatory Visit: Payer: 59 | Admitting: Family Medicine

## 2021-08-17 ENCOUNTER — Ambulatory Visit (INDEPENDENT_AMBULATORY_CARE_PROVIDER_SITE_OTHER): Payer: 59 | Admitting: Family Medicine

## 2021-08-17 VITALS — Wt 301.0 lb

## 2021-08-17 DIAGNOSIS — Z6841 Body Mass Index (BMI) 40.0 and over, adult: Secondary | ICD-10-CM

## 2021-08-17 DIAGNOSIS — R300 Dysuria: Secondary | ICD-10-CM | POA: Diagnosis not present

## 2021-08-17 LAB — POCT URINALYSIS DIP (MANUAL ENTRY)
Bilirubin, UA: NEGATIVE
Glucose, UA: NEGATIVE mg/dL
Ketones, POC UA: NEGATIVE mg/dL
Nitrite, UA: NEGATIVE
Protein Ur, POC: NEGATIVE mg/dL
Spec Grav, UA: 1.02 (ref 1.010–1.025)
Urobilinogen, UA: 0.2 E.U./dL
pH, UA: 7 (ref 5.0–8.0)

## 2021-08-17 LAB — POCT UA - MICROSCOPIC ONLY
RBC, Urine, Miroscopic: >20 (ref 0–2)
WBC, Ur, HPF, POC: >20 (ref 0–5)

## 2021-08-17 MED ORDER — CEPHALEXIN 500 MG PO CAPS: 500.0000 mg | ORAL_CAPSULE | Freq: Four times a day (QID) | ORAL | 0 refills | Status: AC

## 2021-08-17 MED ORDER — OZEMPIC (1 MG/DOSE) 4 MG/3ML ~~LOC~~ SOPN: 1.0000 mg | PEN_INJECTOR | SUBCUTANEOUS | 2 refills | Status: DC

## 2021-08-17 NOTE — Patient Instructions (Signed)
Monica Huang it was a pleasure seeing you today!  Today we discussed a plan for weight management to work towards your goal of 40 lbs.  CONTINUEOzempic 1mg  once weekly Exercise goal: work towards exercising 150 minutes a week. I have included some chair exercises below. Diet goal: work towards substituting your snacks with healthier options such as freezing yogurt.  Follow-up with me in four to five weeks  If you have any questions or concerns before your next appointment please call the clinic

## 2021-08-17 NOTE — Progress Notes (Deleted)
    SUBJECTIVE:   CHIEF COMPLAINT / HPI: f/u hidradenitis suppurativa   Patient reports*** She was referred to surgery on 8/4 for this issue and reports ***   PERTINENT  PMH / PSH: ***  OBJECTIVE:   There were no vitals taken for this visit.  Physical Exam    ASSESSMENT/PLAN:   No problem-specific Assessment & Plan notes found for this encounter.   Consider clindamycin and rifampin 300mg  BID for 10-12 weeks   , MD [facility]

## 2021-08-17 NOTE — Telephone Encounter (Signed)
Patient's urine analysis from reported dysuria during her visit with pharmacy is notable for leukocytes and red blood cells.  Given patient's report of symptoms, will treat with Keflex for 7-day course.  Attempted to call the patient, the patient's significant other and patient's mother with no response after 5 attempts.  Voicemail left on patient's significant other's voicemail informing him of prescription and to return call to the office with any questions.  If patient does call, please inform of urinary tract infection diagnosis and prescription has been sent to patient's pharmacy.  Ronnald Ramp, MD [facility], PGY-3 [phone]

## 2021-08-17 NOTE — Progress Notes (Signed)
   Subjective:    Patient ID: Monica Huang, female    DOB: March 18, 1981, 40 y.o.   MRN: [id number]  HPI Patient is a 40 y.o. female who presents for 3 month weight management follow up. She is in good spirits and presents with assistance of white cane and help of family member. She initiated use of Ozempic 3 months ago. Denies any undesirable side effects and reports compliance with medications.   Patient increased Ozempic to 0.5mg  and completed four doses since we last spoke.  Current meal patterns: Breakfast: skips Lunch: sandwich or sub Supper: skips mostly; yesterday had pasta and salad Snacks: nutty buddy, chips Drinks:coffee (sweetened with cream and/or sugar); water; 1/2 soda (3x/week) Current exercise: squats (10) every night and sit ups (50) every now and then Previously tried weight management pharmacotherapy: none Current weight management pharmacotherapy: Ozempic 0.5mg  x 2 (1mg ) once weekly Patient states that She is taking her medications as prescribed. Patient reports adherence with medications.   Do you feel that your medications are working for you?  yes  Have you been experiencing any side effects to the medications prescribed? no  Do you have any problems obtaining medications due to transportation or finances?  no     Objective:   Vitals with BMI 08/17/2021 07/18/2021 06/30/2021  Height - - 5\' 9"   Weight 301 lbs 300 lbs 302 lbs 13 oz  BMI - 03.75 43.6  Systolic - - 067  Diastolic - - 80  Pulse - - 91     Lab Results  Component Value Date   HGBA1C 6.6 (H) 05/11/2021   HGBA1C 6.5 (A) 09/29/2020   HGBA1C 6.6 (H) 05/19/2020    Assessment/Plan:   Patient has not met goal of at least 5% of body weight loss with comprehensive lifestyle modifications alone in the past 3-6 months. Pharmacotherapy is appropriate to pursue as augmentation. Ozempic 2mg  currently on backorder so will keep patient at 1mg  dose for time being. Patient educated on purpose, proper use and  potential adverse effects of Ozempic.  Following instruction patient verbalized understanding of treatment plan.    Continued Ozempic 1mg  once weekly Comprehensive medication review performed, medication list updated in electronic medical record Patient will adhere to dietary and exercise modifications Patient will report any questions or concerns to provider

## 2021-08-18 ENCOUNTER — Ambulatory Visit: Payer: 59 | Admitting: Physician Assistant

## 2021-08-19 ENCOUNTER — Telehealth: Payer: Self-pay | Admitting: Family Medicine

## 2021-08-19 NOTE — Telephone Encounter (Signed)
Patient called to update the number in her chart. She had an appointment on 09/21 and would like someone to call her with the results when they are available.

## 2021-08-22 NOTE — Telephone Encounter (Signed)
Contacted patient and updated telephone number.  Informed patient of results and prescription for Keflex sent to pharmacy for 7-day course.  Patient voiced understanding.   Ronnald Ramp, MD [facility], PGY-3 [phone]

## 2021-08-26 ENCOUNTER — Other Ambulatory Visit: Payer: Self-pay | Admitting: Surgery

## 2021-08-30 ENCOUNTER — Telehealth: Payer: Self-pay

## 2021-08-30 MED ORDER — FLUCONAZOLE 150 MG PO TABS: 150.0000 mg | ORAL_TABLET | Freq: Once | ORAL | 0 refills | Status: AC

## 2021-08-30 NOTE — Telephone Encounter (Signed)
Patient calls nurse line stating she is on her last day of Keflex and is feeling better. However, patient reports she feels yeast infection coming on. Patient endorses white itchy discharge that has been present for a day or so. Patient is requesting diflucan to her pharmacy. Will forward to provider who saw patient.

## 2021-08-30 NOTE — Telephone Encounter (Signed)
Diflucan RX sent to pharmacy

## 2021-09-01 ENCOUNTER — Other Ambulatory Visit: Payer: Self-pay

## 2021-09-01 ENCOUNTER — Ambulatory Visit (INDEPENDENT_AMBULATORY_CARE_PROVIDER_SITE_OTHER): Payer: 59 | Admitting: Family Medicine

## 2021-09-01 ENCOUNTER — Ambulatory Visit (INDEPENDENT_AMBULATORY_CARE_PROVIDER_SITE_OTHER): Payer: 59

## 2021-09-01 ENCOUNTER — Encounter: Payer: Self-pay | Admitting: Family Medicine

## 2021-09-01 VITALS — BP 128/79 | HR 94 | Ht 69.0 in | Wt 298.2 lb

## 2021-09-01 DIAGNOSIS — Z23 Encounter for immunization: Secondary | ICD-10-CM

## 2021-09-01 DIAGNOSIS — R0602 Shortness of breath: Secondary | ICD-10-CM | POA: Diagnosis not present

## 2021-09-01 DIAGNOSIS — Z3200 Encounter for pregnancy test, result unknown: Secondary | ICD-10-CM

## 2021-09-01 LAB — POCT URINE PREGNANCY: Preg Test, Ur: NEGATIVE

## 2021-09-01 MED ORDER — ALBUTEROL SULFATE HFA 108 (90 BASE) MCG/ACT IN AERS: 2.0000 | INHALATION_SPRAY | Freq: Four times a day (QID) | RESPIRATORY_TRACT | 2 refills | Status: DC | PRN

## 2021-09-01 NOTE — Progress Notes (Signed)
    SUBJECTIVE:   CHIEF COMPLAINT / HPI:   Monica Huang presents today for an episode of shortness of breath that occurred Tuesday.  SOB: Was standing in the bathroom about to leave, and suddenly felt like something knocked the breath out of her. She gasped for air for 2-3 minutes and was not abel to talk during this time.  She had her hand on her chest and noticed that her heartbeat didn't change. Once the episode passed she felt completely normal. She denies feelings of shortness of breath outside of episode, though last night had a brief moment where her breath caught while she was laughing. This kind of episode hasn't ever happened before.  She denies any recent weight loss or night sweating. She has not spent any long periods of time on airplanes or car trips. She has noticed intermittent unilateral leg swelling, but it has been occurring for a long time.  She reports she smokes a half pack daily which she has stopped since this event. She states she is overweight and uses her CPAP diligently. She voices concern that these may be related. She has never had lung function tests, but has not felt short of breath recently or noticed change in breathing. She is breathing comfortably on room air today. Sometime in 2006 or 2007 she was admitted hospital in [place] to pull fluid off her lungs. She doesn't remember the details surrounding the event.  Her mother has panic attacks and anxiety. She has many stressors in her life currently, but doesn't want to talk about it. Endorses feeling most stressed out than ever in her life. Has had baseline anxious feelings but it has increased in the past week due to stressors. She was previously on an antidepressant, however only tried for a few days but stopped due to increased feelings of anxiety.    PERTINENT  PMH / PSH:  Sleep apnea Hydradenitis Hyperlipidemia Essential hypertension  OBJECTIVE:   BP 128/79   Pulse 94   Ht 5\' 9"  (1.753 m)   Wt 298  lb 4 oz (135.3 kg)   LMP 08/01/2021   SpO2 100%   BMI 44.04 kg/m   GEN: Well appearing, in no acute distress, able to participate in exam HEENT: NCAT. Visually impaired.    CARD: Regular rate and rhythm, Nl S1/S2, no murmurs or gallops PULM: Wheezing audible on inspiratory breath on anterior lung fields. Rales auscultated on right anterior lung field. Reduced air movement bilaterally on posterior lung fields, exam limited by body habitus.   EXTREMITIES: No cyanosis or edema. No swelling bilaterally.  Ambulatory pulse ox during interview ranged form 96-98.  ASSESSMENT/PLAN:   Shortness of breath Unclear etiology based on presentation. Considering several possible causes including PE vs. New COPD daignosis vs. pleural effusion vs. anxiety vs. worsening cardiac function. Unable to obtain D-dimer in office today as lab was closed for the day. Sent prescription for albuterol inhaler to treat shortness of breath exacervatuibs. Provided education on inhaler use. Patient has used in the past and feels comfortable using inhaler. Ordered outpatient x-ray for assessment. Obtained negative Upreg in office today. At next visit, pending results of x-ray will obtain pulmonary function tests.     10/01/2021, Medical Student [facility]

## 2021-09-01 NOTE — Patient Instructions (Addendum)
It was a pleasure to see you today!  Please use the albuterol inhaler every 4 hours as needed for wheezing or shortness of breath Please go get a chest x ray at [facility] at your convenience. They are open from 8:30 AM to 4:30 PM M-F. I will call you with results. Follow up on 10/13 here at [facility] at 3:50 PM If you have trouble breathing that does not get better after albuterol and does not improve in 15 minutes, please seek immediate medical evaluation   Be Well,  Dr. Leary Roca

## 2021-09-01 NOTE — Assessment & Plan Note (Addendum)
Unclear etiology based on presentation. Considering several possible causes including PE vs. New COPD daignosis vs. pleural effusion vs. anxiety vs. worsening cardiac function. Unable to obtain D-dimer in office today as lab was closed for the day. Sent prescription for albuterol inhaler to treat shortness of breath exacervatuibs. Provided education on inhaler use. Patient has used in the past and feels comfortable using inhaler. Ordered outpatient x-ray for assessment. Obtained negative Upreg in office today. At next visit, pending results of x-ray will obtain pulmonary function tests.

## 2021-09-02 ENCOUNTER — Other Ambulatory Visit: Payer: Self-pay | Admitting: Family Medicine

## 2021-09-02 MED ORDER — FLUCONAZOLE 150 MG PO TABS: 150.0000 mg | ORAL_TABLET | Freq: Once | ORAL | 0 refills | Status: AC

## 2021-09-02 NOTE — Telephone Encounter (Signed)
Patient calls nurse line requesting additional diflucan tablet. Patient reports that she has had improvement in symptoms, however, is still having vaginal irritation and discharge.   Please advise.   Veronda Prude, RN

## 2021-09-02 NOTE — Telephone Encounter (Signed)
2nd diflucan prescribed.   Ronnald Ramp, MD [facility], PGY-3

## 2021-09-08 ENCOUNTER — Ambulatory Visit: Payer: 59

## 2021-09-09 ENCOUNTER — Ambulatory Visit: Payer: 59 | Admitting: Orthopaedic Surgery

## 2021-09-13 ENCOUNTER — Ambulatory Visit: Payer: 59

## 2021-09-19 ENCOUNTER — Ambulatory Visit: Payer: 59 | Admitting: Diagnostic Neuroimaging

## 2021-09-21 ENCOUNTER — Other Ambulatory Visit: Payer: Self-pay

## 2021-09-21 ENCOUNTER — Ambulatory Visit (INDEPENDENT_AMBULATORY_CARE_PROVIDER_SITE_OTHER): Payer: 59 | Admitting: Pharmacist

## 2021-09-21 DIAGNOSIS — Z6841 Body Mass Index (BMI) 40.0 and over, adult: Secondary | ICD-10-CM

## 2021-09-21 MED ORDER — MOUNJARO 5 MG/0.5ML ~~LOC~~ SOAJ: 5.0000 mg | SUBCUTANEOUS | 0 refills | Status: DC

## 2021-09-21 NOTE — Patient Instructions (Signed)
Monica Huang it was a pleasure seeing you today!  Today we discussed a plan for weight management to work towards your goal of losing 40 lbs  We are going to try to switch your Ozempic to [facility]. This medication will also only be taken once a week Exercise goal: I have included some chair exercises below [place] job giving up soda! Work towards minimizing sugary snacks  Follow-up with me in one month  If you have any questions or concerns before your next appointment please call the clinic  Exercises to do While Sitting Exercises that you do while sitting (chair exercises) can give you many of the same benefits as full exercise. Benefits include strengthening your heart, burning calories, and keeping muscles and joints healthy. Exercise can also improve your mood and help with depression and anxiety. You may benefit from chair exercises if you are unable to do standing exercises due to: Diabetic foot pain. Obesity. Illness. Arthritis. Recovery from surgery or injury. Breathing problems. Balance problems. Another type of disability. Before starting chair exercises, check with your health care provider or a physical therapist to find out how much exercise you can tolerate and which exercises are safe for you. If your health care provider approves: Start out slowly and build up over time. Aim to work up to about 10-20 minutes for each exercise session. Make exercise part of your daily routine. Drink water when you exercise. Do not wait until you are thirsty. Drink every 10-15 minutes. Stop exercising right away if you have pain, nausea, shortness of breath, or dizziness. If you are exercising in a wheelchair, make sure to lock the wheels. Ask your health care provider whether you can do tai chi or yoga. Many positions in these mind-body exercises can be modified to do while seated. Warm-up Before starting other exercises: Sit up as straight as you can. Have your knees bent at 90 degrees,  which is the shape of the capital letter "L." Keep your feet flat on the floor. Sit at the front edge of your chair, if you can. Pull in (tighten) the muscles in your abdomen and stretch your spine and neck as straight as you can. Hold this position for a few minutes. Breathe in and out evenly. Try to concentrate on your breathing, and relax your mind. Stretching Exercise A: Arm stretch Hold your arms out straight in front of your body. Bend your hands at the wrist with your fingers pointing up, as if signaling someone to stop. Notice the slight tension in your forearms as you hold the position. Keeping your arms out and your hands bent, rotate your hands outward as far as you can and hold this stretch. Aim to have your thumbs pointing up and your pinkie fingers pointing down. Slowly repeat arm stretches for one minute as tolerated. Exercise B: Leg stretch If you can move your legs, try to "draw" letters on the floor with the toes of your foot. Write your name with one foot. Write your name with the toes of your other foot. Slowly repeat the movements for one minute as tolerated. Exercise C: Reach for the sky Reach your hands as far over your head as you can to stretch your spine. Move your hands and arms as if you are climbing a rope. Slowly repeat the movements for one minute as tolerated. Range of motion exercises Exercise A: Shoulder roll Let your arms hang loosely at your sides. Lift just your shoulders up toward your ears, then let them  relax back down. When your shoulders feel loose, rotate your shoulders in backward and forward circles. Do shoulder rolls slowly for one minute as tolerated. Exercise B: March in place As if you are marching, pump your arms and lift your legs up and down. Lift your knees as high as you can. If you are unable to lift your knees, just pump your arms and move your ankles and feet up and down. March in place for one minute as tolerated. Exercise C:  Seated jumping jacks Let your arms hang down straight. Keeping your arms straight, lift them up over your head. Aim to point your fingers to the ceiling. While you lift your arms, straighten your legs and slide your heels along the floor to your sides, as wide as you can. As you bring your arms back down to your sides, slide your legs back together. If you are unable to use your legs, just move your arms. Slowly repeat seated jumping jacks for one minute as tolerated. Strengthening exercises Exercise A: Shoulder squeeze Hold your arms straight out from your body to your sides, with your elbows bent and your fists pointed at the ceiling. Keeping your arms in the bent position, move them forward so your elbows and forearms meet in front of your face. Open your arms back out as wide as you can with your elbows still bent, until you feel your shoulder blades squeezing together. Hold for 5 seconds. Slowly repeat the movements forward and backward for one minute as tolerated. Contact a health care provider if: You have to stop exercising due to any of the following: Pain. Nausea. Shortness of breath. Dizziness. Fatigue. You have significant pain or soreness after exercising. Get help right away if: You have chest pain. You have difficulty breathing. These symptoms may represent a serious problem that is an emergency. Do not wait to see if the symptoms will go away. Get medical help right away. Call your local emergency services (911 in the [place] Do not drive yourself to the hospital. Summary Exercises that you do while sitting (chair exercises) can strengthen your heart, burn calories, and keep muscles and joints healthy. You may benefit from chair exercises if you are unable to do standing exercises due to diabetic foot pain, obesity, recovery from surgery or injury, or other conditions. Before starting chair exercises, check with your health care provider or a physical therapist to find out  how much exercise you can tolerate and which exercises are safe for you. This information is not intended to replace advice given to you by your health care provider. Make sure you discuss any questions you have with your health care provider. Document Revised: 01/09/2021 Document Reviewed: 01/09/2021 Elsevier Patient Education  2022 [place] for [employer] Calories are units of energy. Your body needs a certain number of calories from food to keep going throughout the day. When you eat or drink more calories than your body needs, your body stores the extra calories mostly as fat. When you eat or drink fewer calories than your body needs, your body burns fat to get the energy it needs. Calorie counting means keeping track of how many calories you eat and drink each day. Calorie counting can be helpful if you need to lose weight. If you eat fewer calories than your body needs, you should lose weight. Ask your health care provider what a healthy weight is for you. For calorie counting to work, you will need to eat the  right number of calories each day to lose a healthy amount of weight per week. A dietitian can help you figure out how many calories you need in a day and will suggest ways to reach your calorie goal. A healthy amount of weight to lose each week is usually 1-2 lb (0.5-0.9 kg). This usually means that your daily calorie intake should be reduced by 500-750 calories. Eating 1,200-1,500 calories a day can help most women lose weight. Eating 1,500-1,800 calories a day can help most men lose weight. What do I need to know about calorie counting? Work with your health care provider or dietitian to determine how many calories you should get each day. To meet your daily calorie goal, you will need to: Find out how many calories are in each food that you would like to eat. Try to do this before you eat. Decide how much of the food you plan to eat. Keep a food log. Do this  by writing down what you ate and how many calories it had. To successfully lose weight, it is important to balance calorie counting with a healthy lifestyle that includes regular activity. Where do I find calorie information? The number of calories in a food can be found on a Nutrition Facts label. If a food does not have a Nutrition Facts label, try to look up the calories online or ask your dietitian for help. Remember that calories are listed per serving. If you choose to have more than one serving of a food, you will have to multiply the calories per serving by the number of servings you plan to eat. For example, the label on a package of bread might say that a serving size is 1 slice and that there are 90 calories in a serving. If you eat 1 slice, you will have eaten 90 calories. If you eat 2 slices, you will have eaten 180 calories. How do I keep a food log? After each time that you eat, record the following in your food log as soon as possible: What you ate. Be sure to include toppings, sauces, and other extras on the food. How much you ate. This can be measured in cups, ounces, or number of items. How many calories were in each food and drink. The total number of calories in the food you ate. Keep your food log near you, such as in a pocket-sized notebook or on an app or website on your mobile phone. Some programs will calculate calories for you and show you how many calories you have left to meet your daily goal. What are some portion-control tips? Know how many calories are in a serving. This will help you know how many servings you can have of a certain food. Use a measuring cup to measure serving sizes. You could also try weighing out portions on a kitchen scale. With time, you will be able to estimate serving sizes for some foods. Take time to put servings of different foods on your favorite plates or in your favorite bowls and cups so you know what a serving looks like. Try not to eat  straight from a food's packaging, such as from a bag or box. Eating straight from the package makes it hard to see how much you are eating and can lead to overeating. Put the amount you would like to eat in a cup or on a plate to make sure you are eating the right portion. Use smaller plates, glasses, and bowls for smaller portions  and to prevent overeating. Try not to multitask. For example, avoid watching TV or using your computer while eating. If it is time to eat, sit down at a table and enjoy your food. This will help you recognize when you are full. It will also help you be more mindful of what and how much you are eating. What are tips for following this plan? Reading food labels Check the calorie count compared with the serving size. The serving size may be smaller than what you are used to eating. Check the source of the calories. Try to choose foods that are high in protein, fiber, and vitamins, and low in saturated fat, trans fat, and sodium. Shopping Read nutrition labels while you shop. This will help you make healthy decisions about which foods to buy. Pay attention to nutrition labels for low-fat or fat-free foods. These foods sometimes have the same number of calories or more calories than the full-fat versions. They also often have added sugar, starch, or salt to make up for flavor that was removed with the fat. Make a grocery list of lower-calorie foods and stick to it. Cooking Try to cook your favorite foods in a healthier way. For example, try baking instead of frying. Use low-fat dairy products. Meal planning Use more fruits and vegetables. One-half of your plate should be fruits and vegetables. Include lean proteins, such as chicken, [place], and fish. Lifestyle Each week, aim to do one of the following: 150 minutes of moderate exercise, such as walking. 75 minutes of vigorous exercise, such as running. General information Know how many calories are in the foods you eat  most often. This will help you calculate calorie counts faster. Find a way of tracking calories that works for you. Get creative. Try different apps or programs if writing down calories does not work for you. What foods should I eat?  Eat nutritious foods. It is better to have a nutritious, high-calorie food, such as an avocado, than a food with few nutrients, such as a bag of potato chips. Use your calories on foods and drinks that will fill you up and will not leave you hungry soon after eating. Examples of foods that fill you up are nuts and nut butters, vegetables, lean proteins, and high-fiber foods such as whole grains. High-fiber foods are foods with more than 5 g of fiber per serving. Pay attention to calories in drinks. Low-calorie drinks include water and unsweetened drinks. The items listed above may not be a complete list of foods and beverages you can eat. Contact a dietitian for more information. What foods should I limit? Limit foods or drinks that are not good sources of vitamins, minerals, or protein or that are high in unhealthy fats. These include: Candy. Other sweets. Sodas, specialty coffee drinks, alcohol, and juice. The items listed above may not be a complete list of foods and beverages you should avoid. Contact a dietitian for more information. How do I count calories when eating out? Pay attention to portions. Often, portions are much larger when eating out. Try these tips to keep portions smaller: Consider sharing a meal instead of getting your own. If you get your own meal, eat only half of it. Before you start eating, ask for a container and put half of your meal into it. When available, consider ordering smaller portions from the menu instead of full portions. Pay attention to your food and drink choices. Knowing the way food is cooked and what is included with the  meal can help you eat fewer calories. If calories are listed on the menu, choose the lower-calorie  options. Choose dishes that include vegetables, fruits, whole grains, low-fat dairy products, and lean proteins. Choose items that are boiled, broiled, grilled, or steamed. Avoid items that are buttered, battered, fried, or served with cream sauce. Items labeled as crispy are usually fried, unless stated otherwise. Choose water, low-fat milk, unsweetened iced tea, or other drinks without added sugar. If you want an alcoholic beverage, choose a lower-calorie option, such as a glass of wine or light beer. Ask for dressings, sauces, and syrups on the side. These are usually high in calories, so you should limit the amount you eat. If you want a salad, choose a garden salad and ask for grilled meats. Avoid extra toppings such as bacon, cheese, or fried items. Ask for the dressing on the side, or ask for olive oil and vinegar or lemon to use as dressing. Estimate how many servings of a food you are given. Knowing serving sizes will help you be aware of how much food you are eating at restaurants. Where to find more information Centers for Disease Control and Prevention: http://www.wolf.info/ [place]. Department of Agriculture: http://www.wilson-mendoza.org/ Summary Calorie counting means keeping track of how many calories you eat and drink each day. If you eat fewer calories than your body needs, you should lose weight. A healthy amount of weight to lose per week is usually 1-2 lb (0.5-0.9 kg). This usually means reducing your daily calorie intake by 500-750 calories. The number of calories in a food can be found on a Nutrition Facts label. If a food does not have a Nutrition Facts label, try to look up the calories online or ask your dietitian for help. Use smaller plates, glasses, and bowls for smaller portions and to prevent overeating. Use your calories on foods and drinks that will fill you up and not leave you hungry shortly after a meal. This information is not intended to replace advice given to you by your health care provider.  Make sure you discuss any questions you have with your health care provider. Document Revised: 12/25/2019 Document Reviewed: 12/25/2019 Elsevier Patient Education  2022 [employer].

## 2021-09-21 NOTE — Assessment & Plan Note (Signed)
  Patient has not met goal of at least 5% of body weight loss with comprehensive lifestyle modifications alone in the past 3-6 months. Pharmacotherapy is appropriate to pursue as augmentation. Due to patient experiencing minimal weight loss with Ozempic and some reported nausea will switch to Mounjaro 20m once weekly if approved by patient's insurance. Will then titrate up from there, otherwise will keep patient at 182mOzempic dose and have her dial a few clicks back to see if this improves nausea. Patient educated on purpose, proper use and potential adverse effects of Mounjaro.  Following instruction patient verbalized understanding of treatment plan.    . Discontinued Ozempic [id number] initiated Mounjaro 5mg47mce weekly . Comprehensive medication review performed, medication list updated in electronic medical record . Patient will adhere to dietary and exercise modifications . Patient will report any questions or concerns to provider

## 2021-09-21 NOTE — Progress Notes (Signed)
   Subjective:    Patient ID: Monica Huang, female    DOB: 21-Dec-1980, 40 y.o.   MRN: [id number]  HPI Patient is a 40 y.o. female who presents for 3 month weight management follow up. She is in good spirits and presents with assistance of white cane and help of sister. She initiated use of Ozempic 4 months ago. Recently increased Ozempic to 1mg  and patient reports nausea that lasts for about 3 days post administration day without any improvement over the past month.  Current meal patterns: Breakfast: biscuit or grits on weekends but otherwise skips Lunch: sandwich or sub Supper: mostly skips Snacks: nutty buddy, chips, snack cakes Drinks:coffee (sweetened with cream and/or sugar); water;   Current exercise: squats (10) every night and sit ups (50) every now and then Previously tried weight management pharmacotherapy: none Current weight management pharmacotherapy: Ozempic 1mg  once weekly Patient states that She is taking her medications as prescribed. Patient reports adherence with medications.   Do you feel that your medications are working for you?  "Yes but I feel like Ozempic stops working after a few days"  Have you been experiencing any side effects to the medications prescribed? no  Do you have any problems obtaining medications due to transportation or finances?  no     Objective:   Vitals with BMI 09/01/2021 08/17/2021 07/18/2021  Height 5\' 9"  - -  Weight 298 lbs 4 oz 301 lbs 300 lbs  BMI 02.72 - 53.66  Systolic 440 - -  Diastolic 79 - -  Pulse 94 - -     Lab Results  Component Value Date   HGBA1C 6.6 (H) 05/11/2021   HGBA1C 6.5 (A) 09/29/2020   HGBA1C 6.6 (H) 05/19/2020    Assessment/Plan:   Patient has not met goal of at least 5% of body weight loss with comprehensive lifestyle modifications alone in the past 3-6 months. Pharmacotherapy is appropriate to pursue as augmentation. Due to patient experiencing minimal weight loss with Ozempic and some reported nausea  will switch to Mounjaro 5mg  once weekly if approved by patient's insurance. Will then titrate up from there, otherwise will keep patient at 1mg  Ozempic dose and have her dial a few clicks back to see if this improves nausea. Patient educated on purpose, proper use and potential adverse effects of Mounjaro.  Following instruction patient verbalized understanding of treatment plan.    Discontinued Ozempic 1mg  and initiated Mounjaro 5mg  once weekly Comprehensive medication review performed, medication list updated in electronic medical record Patient will adhere to dietary and exercise modifications Patient will report any questions or concerns to provider

## 2021-09-27 ENCOUNTER — Encounter: Payer: Self-pay | Admitting: Family Medicine

## 2021-09-27 ENCOUNTER — Other Ambulatory Visit: Payer: Self-pay

## 2021-09-27 ENCOUNTER — Ambulatory Visit (INDEPENDENT_AMBULATORY_CARE_PROVIDER_SITE_OTHER): Payer: 59 | Admitting: Family Medicine

## 2021-09-27 VITALS — BP 118/76 | HR 86 | Ht 69.0 in | Wt 298.8 lb

## 2021-09-27 DIAGNOSIS — R110 Nausea: Secondary | ICD-10-CM | POA: Diagnosis not present

## 2021-09-27 LAB — POCT URINALYSIS DIP (CLINITEK)
Bilirubin, UA: NEGATIVE
Blood, UA: NEGATIVE
Glucose, UA: NEGATIVE mg/dL
Ketones, POC UA: NEGATIVE mg/dL
Leukocytes, UA: NEGATIVE
Nitrite, UA: NEGATIVE
POC PROTEIN,UA: NEGATIVE
Spec Grav, UA: 1.025 (ref 1.010–1.025)
Urobilinogen, UA: 0.2 E.U./dL
pH, UA: 6.5 (ref 5.0–8.0)

## 2021-09-27 LAB — POCT URINE PREGNANCY: Preg Test, Ur: NEGATIVE

## 2021-09-27 MED ORDER — ONDANSETRON 4 MG PO TBDP: 4.0000 mg | ORAL_TABLET | Freq: Three times a day (TID) | ORAL | 0 refills | Status: AC | PRN

## 2021-09-27 NOTE — Progress Notes (Signed)
    SUBJECTIVE:   CHIEF COMPLAINT / HPI: nausea  Reports nausea for 2 weeks.  Recently treated for UTI and Candida. Thought nausea was related to UTI and  medications. Also noted to have increased in dosage of Ozempic,was to start [facility] next week.  Reports lower abdominal pain similar to UTI. Nausea without vomiting. Denies any diarrhea,constipation, hematemesis, bloody stool, fevers or chills.  No urinary symptoms or back pain.  Appetite good and hydrating well.   Reports bloating and feeling full.   PERTINENT  PMH / PSH:  Obesity class 3 HTN DM Type 2  OBJECTIVE:   BP 118/76   Pulse 86   Ht 5\' 9"  (1.753 m)   Wt 298 lb 12.8 oz (135.5 kg)   LMP 09/07/2021   SpO2 97%   BMI 44.13 kg/m    General: Alert, no acute distress Cardio: Normal S1 and S2, RRR, no r/m/g Pulm: CTAB, normal work of breathing Abdomen: Bowel sounds normal. Abdomen round, protuberant soft and mild tenderness noted to suprapubic and epigastric area   ASSESSMENT/PLAN:   Nausea Likely secondary to increase in Ozempic dosage.  Less likely cholecystitis given negative Murphy's sign.  Low suspicion for ectopic pregnancy given negative UPT.  Also considered medication induced pancreatitis with recent increase in GLP1 -UPT negative -POCT urine negative -Cmet,CBC,Lipase and Lipid panel today -Discussed diet and advancing slowly -Zofran 4mg  q8h prn x 3days  -Follow up in 1 week with PCP or sooner if worsening symptoms.  -Strict return precautions provided     11/07/2021, MD [facility]

## 2021-09-27 NOTE — Patient Instructions (Addendum)
Thank you for coming to see me today. It was a pleasure.   We will get some labs today.  If they are abnormal or we need to do something about them, I will call you.  If they are normal, I will send you a message on MyChart (if it is active) or a letter in the mail.  If you don't hear from [place] in 2 weeks, please call the office at the number below.   Please follow-up with PCP in 2 weeks  If you have any questions or concerns, please do not hesitate to call the office at [phone].  Best,   Dana Allan, MD    Protein Content in Foods Protein is a necessary nutrient in any diet. It helps build and repair muscles, bones, and skin. Depending on your overall health, you may need more or less protein in your diet. You are encouraged to eat a variety of protein foods to ensure that you get all the essential nutrients that are found in different protein foods. Talk with your health care provider or dietitian about how much protein you need each day and which sources of protein are best for you. Protein is especially important for: Repairing and making cells and tissues. Fighting infection. Providing energy. Growth and development. See the following list for the protein content of some common foods. What are tips for getting more protein in your diet? Try to replace processed carbohydrates with high-quality protein. Snack on nuts and seeds instead of chips. Replace baked desserts with [place] yogurt. Eat protein foods from both plant and animal sources. Replace red meat with seafood. Add beans and peas to salads, soups, and side dishes. Include a protein food with each meal and snack. Reading food labels You can find the amount of protein in a food item by looking at the nutrition facts label. Use the total grams listed to help you reach your daily goal. What foods are high in protein? High-protein foods contain 4 grams (g) or more of protein per serving. They include: Grains Quinoa  (cooked) -- 1 cup (185 g) has 8 g of protein. Whole wheat pasta (cooked) -- 1 cup (140 g) has 6 g of protein. Meat Beef, ground sirloin (cooked) -- 3 oz (85 g) has 24 g of protein. Chicken breast, boneless and skinless (cooked) -- 3 oz (85 g) has 25 g of protein. Egg -- 1 egg has 6 g of protein. Fish, filet (cooked) -- 1 oz (28 g) has 6-7 g of protein. Lamb (cooked) -- 3 oz (85 g) has 24 g of protein. Pork tenderloin (cooked) -- 3 oz (85 g) has 23 g of protein. Tuna (canned in water) -- 3 oz (85 g) has 20 g of protein. Dairy Cottage cheese --  cup (114 g) has 13.4 g of protein. Milk -- 1 cup (237 mL) has 8 g of protein. Cheese (hard) -- 1 oz (28 g) has 7 g of protein. Yogurt, regular -- 6 oz (170 g) has 8 g of protein. Greek yogurt -- 6 oz (200 g) has 18 g protein. Plant protein Garbanzo beans (canned or cooked) --  cup (130 g) has 6-7 g of protein. Kidney beans (canned or cooked) --  cup (130 g) has 6-7 g of protein. Nuts (peanuts, pistachios, almonds) -- 1 oz (28 g) has 6 g of protein. Peanut butter -- 1 oz (32 g) has 7-8 g of protein. Pumpkin seeds -- 1 oz (28 g) has 8.5 g of protein.  Soybeans (roasted) -- 1 oz (28 g) has 8 g of protein. Soybeans (cooked) --  cup (90 g) has 11 g of protein. Soy milk -- 1 cup (250 mL) has 5-10 g of protein. Soy or vegetable patty -- 1 patty has 11 g of protein. Sunflower seeds -- 1 oz (28 g) has 5.5 g of protein. Buckwheat -- 1 oz (33 g) has 4.3 g of protein. Tofu (firm) --  cup (124 g) has 20 g of protein. Tempeh --  cup (83 g) has 16 g of protein. The items listed above may not be a complete list of foods high in protein. Actual amounts of protein may differ depending on processing. Contact a dietitian for more information. What foods are low in protein? Low-protein foods contain 3 grams (g) or less of protein per serving. They include: Fruits Fruit or vegetable juice --  cup (125 mL) has 1 g of protein. Vegetables Beets (raw or cooked)  --  cup (68 g) has 1.5 g of protein. Broccoli (raw or cooked) --  cup (44 g) has 2 g of protein. Collard greens (raw or cooked) --  cup (42 g) has 2 g of protein. Green beans (raw or cooked) --  cup (83 g) has 1 g of protein. Green peas (canned) --  cup (80 g) has 3.5 g of protein. Potato (baked with skin) -- 1 medium potato (173 g) has 3 g of protein. Spinach (cooked) --  cup (90 g) has 3 g of protein. Squash (cooked) --  cup (90 g) has 1.5 g of protein. Avocado -- 1 cup (146 g) has 2.7 g of protein. Grains Bran cereal --  cup (30 g) has 2-3 g of protein. Bread -- 1 slice has 2.5 g of protein. Corn (fresh or cooked) --  cup (77 g) has 2 g of protein. Flour tortilla -- One 6-inch (15 cm) tortilla has 2.5 g of protein. Muffins -- 1 small muffin (2 oz or 57 g) has 3 g of protein. Oatmeal (cooked) --  cup (40 g) has 3 g of protein. Rice (cooked) --  cup (79 g) has 2.5-3.5 g of protein. Dairy Cream cheese -- 1 oz (29 g) has 2 g of protein. Creamer (half-and-half) -- 1 oz (29 mL) has 1 g of protein. Frozen yogurt --  cup (72 g) has 3 g of protein. Sour cream --  cup (75 g) has 2.5 g of protein. The items listed above may not be a complete list of foods low in protein. Actual amounts of protein may differ depending on processing. Contact a dietitian for more information. Summary Protein is a nutrient that your body needs for growth and development, repairing and making cells and tissues, fighting infection, and providing energy. Protein is in both plant and animal foods. Some of these foods have more protein than others. Depending on your overall health, you may need more or less protein in your diet. Talk to your health care provider about how much protein you need. This information is not intended to replace advice given to you by your health care provider. Make sure you discuss any questions you have with your health care provider. Document Revised: 10/18/2020 Document Reviewed:  10/18/2020 Elsevier Patient Education  2022 [employer].

## 2021-09-28 LAB — CBC
Hematocrit: 38.6 % (ref 34.0–46.6)
Hemoglobin: 13.4 g/dL (ref 11.1–15.9)
MCH: 32.9 pg (ref 26.6–33.0)
MCHC: 34.7 g/dL (ref 31.5–35.7)
MCV: 95 fL (ref 79–97)
Platelets: 339 10*3/uL (ref 150–450)
RBC: 4.07 x10E6/uL (ref 3.77–5.28)
RDW: 13.2 % (ref 11.7–15.4)
WBC: 9.1 10*3/uL (ref 3.4–10.8)

## 2021-09-28 LAB — COMPREHENSIVE METABOLIC PANEL
ALT: 10 IU/L (ref 0–32)
AST: 11 IU/L (ref 0–40)
Albumin/Globulin Ratio: 1.2 (ref 1.2–2.2)
Albumin: 4 g/dL (ref 3.8–4.8)
Alkaline Phosphatase: 62 IU/L (ref 44–121)
BUN/Creatinine Ratio: 13 (ref 9–23)
BUN: 9 mg/dL (ref 6–20)
Bilirubin Total: 0.2 mg/dL (ref 0.0–1.2)
CO2: 25 mmol/L (ref 20–29)
Calcium: 9 mg/dL (ref 8.7–10.2)
Chloride: 103 mmol/L (ref 96–106)
Creatinine, Ser: 0.72 mg/dL (ref 0.57–1.00)
Globulin, Total: 3.4 g/dL (ref 1.5–4.5)
Glucose: 107 mg/dL — ABNORMAL HIGH (ref 70–99)
Potassium: 4.6 mmol/L (ref 3.5–5.2)
Sodium: 141 mmol/L (ref 134–144)
Total Protein: 7.4 g/dL (ref 6.0–8.5)
eGFR: 109 mL/min/{1.73_m2} (ref >59)

## 2021-09-28 LAB — LIPASE: Lipase: 280 U/L — ABNORMAL HIGH (ref 14–72)

## 2021-09-30 ENCOUNTER — Encounter: Payer: Self-pay | Admitting: Family Medicine

## 2021-09-30 ENCOUNTER — Other Ambulatory Visit: Payer: Self-pay | Admitting: Specialist

## 2021-09-30 DIAGNOSIS — R110 Nausea: Secondary | ICD-10-CM | POA: Insufficient documentation

## 2021-09-30 MED ORDER — METHOCARBAMOL 500 MG PO TABS: 500.0000 mg | ORAL_TABLET | Freq: Three times a day (TID) | ORAL | 0 refills | Status: DC | PRN

## 2021-09-30 MED ORDER — IBUPROFEN 800 MG PO TABS: 800.0000 mg | ORAL_TABLET | Freq: Three times a day (TID) | ORAL | 1 refills | Status: DC | PRN

## 2021-09-30 NOTE — Telephone Encounter (Signed)
Please advise 

## 2021-09-30 NOTE — Assessment & Plan Note (Signed)
Likely secondary to increase in Ozempic dosage.  Less likely cholecystitis given negative Murphy's sign.  Low suspicion for ectopic pregnancy given negative UPT.  Also considered medication induced pancreatitis with recent increase in GLP1 -UPT negative -POCT urine negative -Cmet,CBC,Lipase and Lipid panel today -Discussed diet and advancing slowly -Zofran 4mg  q8h prn x 3days  -Follow up in 1 week with PCP or sooner if worsening symptoms.  -Strict return precautions provided

## 2021-09-30 NOTE — Telephone Encounter (Signed)
Pt called asking to have a refill of her robaxin and ibuprofen rx sent in to the pharmacy. She would like a CB when this has been done please.   [phone]

## 2021-10-05 ENCOUNTER — Ambulatory Visit: Payer: Self-pay

## 2021-10-05 ENCOUNTER — Other Ambulatory Visit: Payer: Self-pay

## 2021-10-05 ENCOUNTER — Ambulatory Visit (INDEPENDENT_AMBULATORY_CARE_PROVIDER_SITE_OTHER): Payer: 59 | Admitting: Surgery

## 2021-10-05 ENCOUNTER — Encounter: Payer: Self-pay | Admitting: Surgery

## 2021-10-05 VITALS — BP 124/74 | HR 92 | Ht 69.0 in | Wt 298.8 lb

## 2021-10-05 DIAGNOSIS — M25431 Effusion, right wrist: Secondary | ICD-10-CM

## 2021-10-05 DIAGNOSIS — M25531 Pain in right wrist: Secondary | ICD-10-CM | POA: Diagnosis not present

## 2021-10-05 NOTE — Progress Notes (Signed)
Office Visit Note   Patient: Monica Huang           Date of Birth: 07/06/1981           MRN: [id number] Visit Date: 10/05/2021              Requested by: [name], [place],  [place] [address] PCP: [name], DO   Assessment & Plan: Visit Diagnoses:  1. Pain in right wrist   2. Wrist swelling, right     Plan: Today blood work was drawn to check a CBC and arthritis panel.  Sent in prescription for Voltaren 75 mg p.o. twice daily as needed to be taken with food.  Follow-up in 1 week to see how she is doing.  We will also review labs at that time.  Follow-Up Instructions: Return in about 1 week (around 10/12/2021) for with Jenson Beedle to review labs and recheck right wrist.   Orders:  Orders Placed This Encounter  Procedures   XR Wrist Complete Right   CBC with Differential   Antinuclear Antib (ANA)   Uric acid   Rheumatoid Factor   Sed Rate (ESR)   No orders of the defined types were placed in this encounter.     Procedures: No procedures performed   Clinical Data: No additional findings.   Subjective: Chief Complaint  Patient presents with   Right Wrist - Pain    HPI 40 year old black female comes in today with complaints of right wrist pain and swelling for about a month.  Patient denies any injury.  She has wrist pain when she attempts to bend or grab objects.  She denies personal history of gout rheumatoid arthritis lupus.  She does have an aunt with RA.  No complaints of fever chills. Review of Systems No current cardiac pulmonary GI GU issues  Objective: Vital Signs: BP 124/74   Pulse 92   Ht '5\' 9"'  (1.753 m)   Wt 298 lb 12.8 oz (135.5 kg)   LMP 09/07/2021   BMI 44.13 kg/m   Physical Exam Constitutional:      Appearance: She is obese.  HENT:     Head: Normocephalic and atraumatic.  Pulmonary:     Effort: No respiratory distress.  Musculoskeletal:     Comments: Exam right wrist she does have some limitation  range of motion.  Tender across the dorsal aspect.  No signs of infection.  No redness.  Moves fingers well.  Neurovascularly intact.  Neurological:     Mental Status: She is alert and oriented to person, place, and time.    Ortho Exam  Specialty Comments:  No specialty comments available.  Imaging: No results found.   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    Class: Chronic   Spondylosis without myelopathy or radiculopathy, cervical region 05/25/2020    Class: Chronic   Status post cervical spinal fusion 05/25/2020   Suspected sleep apnea 03/11/2020   Palpitations 12/23/2019   Chronic pain of both knees 10/23/2019   Neck and shoulder pain 07/04/2019   Close exposure to COVID-19 virus 06/25/2019  Left knee pain 06/10/2019   Viral upper respiratory infection 05/22/2019   Cervical radiculitis 03/10/2019   Prediabetes 02/06/2019   Carpal tunnel syndrome 02/03/2019   Vaginal discharge 07/05/2016   Anxiety and depression 06/10/2016   Other optic atrophy, 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   Essential hypertension, benign 05/10/2015   Dizziness 09/30/2014   Mechanical complication-ventricular(CSF) communicating shunt ([place]) 03/05/2013   Hidradenitis suppurativa 10/15/2012   Right hand pain 02/13/2012   Tobacco abuse 01/03/2012   Legally blind 01/03/2012   Obesity 01/03/2012   Healthcare maintenance 01/03/2012   Marijuana smoker 01/03/2012   Past Medical History:  Diagnosis Date   Ambulates with cane    Chronic back pain    Chronic leg pain    Diabetes mellitus without complication (HCC)    Type  II - no meds   Dizziness    Hydradenitis    Hyperlipidemia    diet controlle - no meds   Legally blind    uses cane - some limited vision   Macular degeneration    patient denies this dx  (dr street dx)   Pneumonia    x 1   Pseudotumor cerebri syndrome 2005   shunt placed- and legally blind   Sciatica    Sleep apnea    "Mild" - has appt on 06/23/20 to see about cpap machine   Wears partial dentures    upper and lower    Family History  Problem Relation Age of Onset   Diabetes Mother    Hypertension Mother    Diabetes Father     Past Surgical History:  Procedure Laterality Date   ANTERIOR CERVICAL DECOMP/DISCECTOMY FUSION N/A 05/25/2020   Procedure: ANTERIOR CERVICAL DISCECTOMY AND FUSION CERVICAL FIVE THROUGH CERVICAL SIX WITH PLATES, SCREWS, ALLOGRAFT AND LOCAL BONE GRAFT, VIVIGEN;  Surgeon: Jessy Oto, MD;  Location: [facility];  Service: Orthopedics;  Laterality: N/A;   CSF SHUNT     2 revisions   MULTIPLE EXTRACTIONS WITH ALVEOLOPLASTY Bilateral 11/02/2017   Procedure: MULTIPLE EXTRACTION;  Surgeon: Diona Browner, DDS;  Location: [facility];  Service: Oral Surgery;  Laterality: Bilateral;   Social History   Occupational History   Not on file  Tobacco Use   Smoking status: Every Day    Packs/day: 0.50    Years: 21.00    Pack years: 10.50    Types: Cigarettes   Smokeless tobacco: Never  Vaping Use   Vaping Use: Never used  Substance and Sexual Activity   Alcohol use: Yes    Comment: occasional   Drug use: Yes    Types: Marijuana    Comment: marijuana -- bag per day. - -4 joints/day   Sexual activity: Yes    Birth control/protection: None    Comment: 1 partners

## 2021-10-06 LAB — CBC WITH DIFFERENTIAL/PLATELET
Absolute Monocytes: 475 cells/uL (ref 200–950)
Basophils Absolute: 73 cells/uL (ref 0–200)
Basophils Relative: 1 %
Eosinophils Absolute: 80 cells/uL (ref 15–500)
Eosinophils Relative: 1.1 %
HCT: 39.2 % (ref 35.0–45.0)
Hemoglobin: 13.2 g/dL (ref 11.7–15.5)
Lymphs Abs: 2540 cells/uL (ref 850–3900)
MCH: 32.9 pg (ref 27.0–33.0)
MCHC: 33.7 g/dL (ref 32.0–36.0)
MCV: 97.8 fL (ref 80.0–100.0)
MPV: 9.9 fL (ref 7.5–12.5)
Monocytes Relative: 6.5 %
Neutro Abs: 4132 cells/uL (ref 1500–7800)
Neutrophils Relative %: 56.6 %
Platelets: 349 10*3/uL (ref 140–400)
RBC: 4.01 10*6/uL (ref 3.80–5.10)
RDW: 13.3 % (ref 11.0–15.0)
Total Lymphocyte: 34.8 %
WBC: 7.3 10*3/uL (ref 3.8–10.8)

## 2021-10-06 LAB — SEDIMENTATION RATE: Sed Rate: 19 mm/h (ref 0–20)

## 2021-10-06 LAB — ANA: Anti Nuclear Antibody (ANA): NEGATIVE

## 2021-10-06 LAB — RHEUMATOID FACTOR: Rheumatoid fact SerPl-aCnc: <14 IU/mL (ref <14)

## 2021-10-06 LAB — URIC ACID: Uric Acid, Serum: 6.2 mg/dL (ref 2.5–7.0)

## 2021-10-06 SURGERY — Surgical Case
Anesthesia: *Unknown

## 2021-10-09 NOTE — Progress Notes (Deleted)
Cardiology Office Note:    Date:  10/09/2021   ID:  Monica Huang, DOB Dec 18, 1980, MRN [id number]  PCP:  Monica Jumbo, DO [facility] Cardiologist: Monica Gu[place], MD   Reason for visit: Chest pain  History of Present Illness:    Monica Huang is a 40 y.o. female with a hx of blindness secondary to pseudotumor cerebri and is status post VP shunting, diabetes mellitus, hypertension, OSA, first-degree AV block and tobacco use.  She called with sharp right chest pain in July.  Chest pain -***  Hypertension -*** -Goal BP is <130/80.  Recommend DASH diet (high in vegetables, fruits, low-fat dairy products, whole grains, poultry, fish, and nuts and low in sweets, sugar-sweetened beverages, and red meats), salt restriction and increase physical activity.  Hyperlipidemia -*** -Discussed cholesterol lowering diets - Mediterranean diet, DASH diet, vegetarian diet, low-carbohydrate diet and avoidance of trans fats.  Discussed healthier choice substitutes.  Nuts, high-fiber foods, and fiber supplements may also improve lipids.    Obesity -Discussed how even a 5-10% weight loss can have cardiovascular benefits.   -Recommend moderate intensity activity for 30 minutes 5 days/week and the DASH diet.  Tobacco use  -Recommend tobacco cessation.  Reviewed physiologic effects of nicotine and the immediate-eventual benefits of quitting including improvement in cough/breathing and reduction in cardiovascular events.  Discussed quitting tips such as removing triggers and getting support from family/friends and Quitline [place]. -USPSTF recommends one-time screening for abdominal aortic aneurysm (AAA) by ultrasound in men 31 -46 years old who have ever smoked.      Disposition - Follow-up in ***     Past Medical History:  Diagnosis Date   Ambulates with cane    Chronic back pain    Chronic leg pain    Diabetes mellitus without complication (HCC)    Type II - no meds   Dizziness     Hydradenitis    Hyperlipidemia    diet controlle - no meds   Legally blind    uses cane - some limited vision   Macular degeneration    patient denies this dx  (Monica Huang dx)   Pneumonia    x 1   Pseudotumor cerebri syndrome 2005   shunt placed- and legally blind   Sciatica    Sleep apnea    "Mild" - has appt on 06/23/20 to see about cpap machine   Wears partial dentures    upper and lower    Past Surgical History:  Procedure Laterality Date   ANTERIOR CERVICAL DECOMP/DISCECTOMY FUSION N/A 05/25/2020   Procedure: ANTERIOR CERVICAL DISCECTOMY AND FUSION CERVICAL FIVE THROUGH CERVICAL SIX WITH PLATES, SCREWS, ALLOGRAFT AND LOCAL BONE GRAFT, VIVIGEN;  Surgeon: Monica Champagne, MD;  Location: [facility];  Service: Orthopedics;  Laterality: N/A;   CSF SHUNT     2 revisions   MULTIPLE EXTRACTIONS WITH ALVEOLOPLASTY Bilateral 11/02/2017   Procedure: MULTIPLE EXTRACTION;  Surgeon: Monica Huang, DDS;  Location: [facility];  Service: Oral Surgery;  Laterality: Bilateral;    Current Medications: No outpatient medications have been marked as taking for the 10/10/21 encounter (Appointment) with Monica Kettle, PA-C.     Allergies:   Patient has no known allergies.   Social History   Socioeconomic History   Marital status: Single    Spouse name: Not on file   Number of children: Not on file   Years of education: Not on file   Highest education level: Not on file  Occupational History   Not  on file  Tobacco Use   Smoking status: Every Day    Packs/day: 0.50    Years: 21.00    Pack years: 10.50    Types: Cigarettes   Smokeless tobacco: Never  Vaping Use   Vaping Use: Never used  Substance and Sexual Activity   Alcohol use: Yes    Comment: occasional   Drug use: Yes    Types: Marijuana    Comment: marijuana -- bag per day. - -4 joints/day   Sexual activity: Yes    Birth control/protection: None    Comment: 1 partners  Other Topics Concern   Not on file  Social History Narrative    On disability since 2005 due to vision loss from psuedotumor cerebri.   Did not finish highschool-- completed the 9th grade.    Walking 2 x week for 50min.          Social Determinants of Health   Financial Resource Strain: Not on file  Food Insecurity: Not on file  Transportation Needs: Not on file  Physical Activity: Not on file  Stress: Not on file  Social Connections: Not on file     Family History: The patient's family history includes Diabetes in her father and mother; Hypertension in her mother.  ROS:   Please see the history of present illness.     EKGs/Labs/Other Studies Reviewed:    EKG:  The ekg ordered today demonstrates ***  Recent Labs: 09/27/2021: ALT 10; BUN 9; Creatinine, Ser 0.72; Potassium 4.6; Sodium 141 10/05/2021: Hemoglobin 13.2; Platelets 349   Recent Lipid Panel Lab Results  Component Value Date/Time   CHOL 170 05/11/2021 10:03 AM   TRIG 107 05/11/2021 10:03 AM   HDL 47 05/11/2021 10:03 AM   LDLCALC 104 (H) 05/11/2021 10:03 AM   LDLDIRECT 100 (H) 01/01/2012 11:55 AM    Physical Exam:    VS:  There were no vitals taken for this visit.   No data found.  Wt Readings from Last 3 Encounters:  10/05/21 298 lb 12.8 oz (135.5 kg)  09/27/21 298 lb 12.8 oz (135.5 kg)  09/21/21 298 lb 11.2 oz (135.5 kg)     GEN: *** Well nourished, well developed in no acute distress HEENT: Normal NECK: No JVD; No carotid bruits CARDIAC: ***RRR, no murmurs, rubs, gallops RESPIRATORY:  Clear to auscultation without rales, wheezing or rhonchi  ABDOMEN: Soft, non-tender, non-distended MUSCULOSKELETAL: No edema; No deformity  SKIN: Warm and dry NEUROLOGIC:  Alert and oriented PSYCHIATRIC:  Normal affect     ASSESSMENT AND PLAN   ***   {Are you ordering a CV Procedure (e.g. stress test, cath, DCCV, TEE, etc)?   Press F2        :[id number]    Medication Adjustments/Labs and Tests Ordered: Current medicines are reviewed at length with the patient today.   Concerns regarding medicines are outlined above.  No orders of the defined types were placed in this encounter.  No orders of the defined types were placed in this encounter.   There are no Patient Instructions on file for this visit.   Signed, Warren Lacy, PA-C  10/09/2021 8:28 PM    [facility]

## 2021-10-10 ENCOUNTER — Ambulatory Visit: Payer: 59 | Admitting: Physician Assistant

## 2021-10-10 DIAGNOSIS — I10 Essential (primary) hypertension: Secondary | ICD-10-CM

## 2021-10-10 DIAGNOSIS — R072 Precordial pain: Secondary | ICD-10-CM

## 2021-10-13 ENCOUNTER — Ambulatory Visit: Payer: 59 | Admitting: Surgery

## 2021-10-17 ENCOUNTER — Ambulatory Visit (INDEPENDENT_AMBULATORY_CARE_PROVIDER_SITE_OTHER): Payer: 59 | Admitting: Student

## 2021-10-17 ENCOUNTER — Encounter: Payer: Self-pay | Admitting: Student

## 2021-10-17 ENCOUNTER — Other Ambulatory Visit: Payer: Self-pay

## 2021-10-17 VITALS — BP 124/78 | HR 84 | Ht 68.0 in | Wt 302.6 lb

## 2021-10-17 DIAGNOSIS — G4733 Obstructive sleep apnea (adult) (pediatric): Secondary | ICD-10-CM

## 2021-10-17 DIAGNOSIS — R002 Palpitations: Secondary | ICD-10-CM

## 2021-10-17 DIAGNOSIS — E785 Hyperlipidemia, unspecified: Secondary | ICD-10-CM

## 2021-10-17 DIAGNOSIS — E1169 Type 2 diabetes mellitus with other specified complication: Secondary | ICD-10-CM

## 2021-10-17 DIAGNOSIS — I10 Essential (primary) hypertension: Secondary | ICD-10-CM | POA: Diagnosis not present

## 2021-10-17 DIAGNOSIS — Z720 Tobacco use: Secondary | ICD-10-CM

## 2021-10-17 DIAGNOSIS — I441 Atrioventricular block, second degree: Secondary | ICD-10-CM

## 2021-10-17 NOTE — Progress Notes (Signed)
Cardiology Office Note:    Date:  10/17/2021  ID:  Monica Huang, DOB 05-04-1981, MRN [id number]  PCP:  [name], DO  Cardiologist:  [name], MD  Electrophysiologist:  None   Referring MD: [name], DO   Chief Complaint: routine follow-up of palpitations and dizziness/presyncope  History of Present Illness:    Monica Huang is a 40 y.o. female with a history of palpitations, 2nd degree type 1 AV block (Wenckebach), hypertension, hyperlipidemia, type 2 diabetes mellitus, obstructive sleep apnea on CPAP, hidradenitis, chronic pain, morbid obesity, tobacco abuse, and blindness secondary to pseudotumor cerebri s/p VP shunting who is followed by Dr. Claiborne Billings and presents today for routine follow-up of palpitations and dizziness/presyncope.  Patient has a history of dizziness and presyncope. Echo in 08/2017 showed LVEF of 60-65% with normal wall motion, mild MR, and mild TR. Holter Monitor in 08/2017 showed underlying sinus rhythm (average heart rate 88 bpm) with one episode of type 1 second degree AV block (Wenkebach) and three episodes of 2nd degree block with ventricular pause over 2 seconds but less than 3 seconds as well as some PACs and PVCs. Patient was seen in the ED in 11/2019 for palpitations. Initial work-up was negative but patient left AMA before work-up could be completed. At follow-up visit with Cardiology in 01/2020, she reported intermittent episodes of presyncope with associated vague chest discomfort and a noticeable drop in the heartbeat. Repeat Zio monitor was ordered and showed underlying sinus rhythm (average heart rate 99 bpm) with several episodes of type 1 second degree AV block and one 9 beat run of NSVT as well as rare PACs/PVCs but no atrial fibrillation. Sleep study was also ordered at this visit and was consistent with obstructive sleep apnea so she was started on CPAP. Patient was last seen by Dr. Claiborne Billings in 09/2021 at which time for follow-up of her  sleep apnea and was advised to use CPAP throughout the entire night.   Patient presents today for routine follow-up. Here with her sister. Patient reports doing well since last visit. She denies any chest pain. She notes intermittent dyspnea on exertion that seems to be related to if she does not wear her CPAP all night which does not occur often. In fact, she states she wears her CPAP all night now but sometimes it will only read 70% (she is unsure if she has a leak in her mask). She denies any orthopnea, PND, or lower extremity edema. She notes occasional palpitations that she describes as "fluttering" but no sustained/prolonged palpitations. She denies any recurrent dizziness/pre-syncope since she started wearing her CPAP all night. Her biggest concern today is her weight and she wants to know how she can lose weight. She states she exercises everyday and does activities like sit ups and squats but her diet is poor. However, she seems motivated to make some changes and lose weight. She also still continues to smoke 1/2 pack per day.  Past Medical History:  Diagnosis Date   Ambulates with cane    Chronic back pain    Chronic leg pain    Diabetes mellitus without complication (HCC)    Type II - no meds   Dizziness    Hydradenitis    Hyperlipidemia    diet controlle - no meds   Legally blind    uses cane - some limited vision   Macular degeneration    patient denies this dx  (dr street dx)   Pneumonia    x 1  Pseudotumor cerebri syndrome 2005   shunt placed- and legally blind   Sciatica    Sleep apnea    "Mild" - has appt on 06/23/20 to see about cpap machine   Wears partial dentures    upper and lower    Past Surgical History:  Procedure Laterality Date   ANTERIOR CERVICAL DECOMP/DISCECTOMY FUSION N/A 05/25/2020   Procedure: ANTERIOR CERVICAL DISCECTOMY AND FUSION CERVICAL FIVE THROUGH CERVICAL SIX WITH PLATES, SCREWS, ALLOGRAFT AND LOCAL BONE GRAFT, VIVIGEN;  Surgeon: Jessy Oto, MD;  Location: [facility];  Service: Orthopedics;  Laterality: N/A;   CSF SHUNT     2 revisions   MULTIPLE EXTRACTIONS WITH ALVEOLOPLASTY Bilateral 11/02/2017   Procedure: MULTIPLE EXTRACTION;  Surgeon: Diona Browner, DDS;  Location: [facility];  Service: Oral Surgery;  Laterality: Bilateral;    Current Medications: Current Meds  Medication Sig   albuterol (VENTOLIN HFA) 108 (90 Base) MCG/ACT inhaler Inhale 2 puffs into the lungs every 6 (six) hours as needed for wheezing or shortness of breath.   clindamycin (CLEOCIN) 300 MG capsule Take 300 mg by mouth 2 (two) times daily.   clindamycin (CLINDAGEL) 1 % gel APPLY TOPICALLY TWICE A DAY   gabapentin (NEURONTIN) 100 MG capsule Take 1 capsule (100 mg total) by mouth at bedtime.   ibuprofen (ADVIL) 800 MG tablet Take 1 tablet (800 mg total) by mouth every 8 (eight) hours as needed for moderate pain.   methocarbamol (ROBAXIN) 500 MG tablet Take 1 tablet (500 mg total) by mouth every 8 (eight) hours as needed for muscle spasms.   tirzepatide [facility]) 5 MG/0.5ML Pen Inject 5 mg into the skin once a week.   [DISCONTINUED] rifampin (RIFADIN) 300 MG capsule Take 1 capsule (300 mg total) by mouth 2 (two) times daily.   [DISCONTINUED] triamcinolone ointment (KENALOG) 0.1 % Apply 1 application topically 2 (two) times daily.     Allergies:   Patient has no known allergies.   Social History   Socioeconomic History   Marital status: Single    Spouse name: Not on file   Number of children: Not on file   Years of education: Not on file   Highest education level: Not on file  Occupational History   Not on file  Tobacco Use   Smoking status: Every Day    Packs/day: 0.50    Years: 21.00    Pack years: 10.50    Types: Cigarettes   Smokeless tobacco: Never  Vaping Use   Vaping Use: Never used  Substance and Sexual Activity   Alcohol use: Yes    Comment: occasional   Drug use: Yes    Types: Marijuana    Comment: marijuana -- bag per day. - -4  joints/day   Sexual activity: Yes    Birth control/protection: None    Comment: 1 partners  Other Topics Concern   Not on file  Social History Narrative   On disability since 2005 due to vision loss from psuedotumor cerebri.   Did not finish highschool-- completed the 9th grade.    Walking 2 x week for 68min.          Social Determinants of Health   Financial Resource Strain: Not on file  Food Insecurity: Not on file  Transportation Needs: Not on file  Physical Activity: Not on file  Stress: Not on file  Social Connections: Not on file     Family History: The patient's family history includes Diabetes in her father and mother; Hypertension  in her mother.  ROS:   Please see the history of present illness.     EKGs/Labs/Other Studies Reviewed:    The following studies were reviewed today:  Echocardiogram 09/17/2017: Study Conclusions: - Left ventricle: The cavity size was normal. Systolic function was    normal. The estimated ejection fraction was in the range of 60%    to 65%. Wall motion was normal; there were no regional wall    motion abnormalities. Left ventricular diastolic function    parameters were normal.  - Aortic valve: Trileaflet; normal thickness leaflets. There was no    regurgitation.  - Aortic root: The aortic root was normal in size.  - Mitral valve: There was mild regurgitation.  - Right ventricle: The cavity size was normal. Wall thickness was    normal. Systolic function was normal.  - Tricuspid valve: There was mild regurgitation.  - Pulmonic valve: There was no regurgitation.  - Pulmonary arteries: Systolic pressure was within the normal    range.  - Inferior vena cava: The vessel was normal in size.  - Pericardium, extracardiac: There was no pericardial effusion.   Impressions: - Mild mitral and tricuspid regurgitation, otherwise normal study.  _______________  Holter Monitor 08/2017: The predominant rhythm is sinus rhythm with an  average heart rate at 88 bpm.  There were episodes of sinus bradycardia to a minimum of 41 and sinus tachycardia to a maximum of 144 bpm.  There was an episode of type I Wenkebach second-degree AV block.  There were 3 episodes of second-degree block with a ventricular pause over 2.0 seconds lasting 2.08, 2.04, and 2.36 seconds.  There were isolated PVCs with 7 bigeminal beats and some PACs with 2 atrial couplets. The patient is not on any negative chronotropic medications. _______________  Chauncy Passy 03/08/2020 to 03/15/2020: The average heart rate was sinus rhythm at 99 bpm with a minimum sinus rhythm being at 46 bpm and maximum at 144 bpm.  The patient had a minimum heart rate at 25 bpm which occurred at 6:34 AM on 416 and a maximal heart rate at 164 bpm.  The predominant rhythm was sinus rhythm.  There was an episode of 9 beats of ventricular tachycardia with an average rate of 153 bpm.  There were several episodes of second-degree Mobitz type I AV block 1 episode the rate ranged from 37-60 and another episode the rate ranged from 26 to 78 bpm.  There were rare isolated PACs.  There were no couplets or triplets.  There were no isolated PVCs couplets or triplets.  There was no evidence for atrial fibrillation.  EKG:  EKG ordered today. EKG personally reviewed and demonstrates normal sinus rhythm, rate 84 bpm, with 1st degree AV block and LVH but no acute ST/T changes. Left axis deviation. QTc 420 ms.  Recent Labs: 09/27/2021: ALT 10; BUN 9; Creatinine, Ser 0.72; Potassium 4.6; Sodium 141 10/05/2021: Hemoglobin 13.2; Platelets 349  Recent Lipid Panel    Component Value Date/Time   CHOL 170 05/11/2021 1003   TRIG 107 05/11/2021 1003   HDL 47 05/11/2021 1003   CHOLHDL 3.6 05/11/2021 1003   CHOLHDL 3.3 03/11/2015 0841   VLDL 21 03/11/2015 0841   LDLCALC 104 (H) 05/11/2021 1003   LDLDIRECT 100 (H) 01/01/2012 1155    Physical Exam:    Vital Signs: BP 124/78   Pulse 84   Ht 5\' 8"  (1.727 m)   Wt  (!) 302 lb 9.6 oz (137.3 kg)   SpO2 100%  BMI 46.01 kg/m     Wt Readings from Last 3 Encounters:  10/17/21 (!) 302 lb 9.6 oz (137.3 kg)  10/05/21 298 lb 12.8 oz (135.5 kg)  09/27/21 298 lb 12.8 oz (135.5 kg)     General: 40 y.o. morbidly obese African-American female in no acute distress. HEENT: Normocephalic and atraumatic. Sclera clear.  Neck: Supple. No carotid bruits. JVD difficult to assess due to body habitus but none appreciated. Heart: RRR. Distinct S1 and S2. No murmurs, gallops, or rubs. Radial pulses 2+ and equal bilaterally. Lungs: No increased work of breathing. Clear to ausculation bilaterally. No wheezes, rhonchi, or rales.  Abdomen: Soft, non-distended, and non-tender to palpation.  Extremities: No lower extremity edema.    Skin: Warm and dry. Neuro: Alert and oriented x3. No focal deficits. Psych: Normal affect. Responds appropriately.  Assessment:    1. Palpitations   2. Second degree AV block, Mobitz type I   3. Primary hypertension   4. Hyperlipidemia, unspecified hyperlipidemia type   5. Type 2 diabetes mellitus with morbid obesity (HCC)   6. OSA (obstructive sleep apnea)   7. Morbid obesity (HCC)   8. Tobacco abuse     Plan:    Palpitations 2nd Degree Type 1 AV Block (Wenckebach) Patient has a history of palpitations, dizziness, and presyncope. Monitor in 2018 and 2021 showed second degree type 1 AV block, one 9 beat run of NSVT, and rare PACs/PVCs.  - Symptoms stable. No significant palpitations and dizziness improved once she started CPAP. - No additional work-up necessary at this time.  Hypertension Patient states she used to have hypertension and used to be on medications but no longer is. BP well controlled today. - No medications necessary at this time.  Hyperlipidemia Patient states she used to have hyperlipidemia and used to be on medications but no longer is. Last lipid panel in 04/2021: Total Cholesterol 170, Triglycerides 107, HDL 47,  LDL 104.  - 10 year ASCVD risk is low. Therefore, no statin necessary at this time. Recommended lifestyle medications with diet and exercise.  Type 2 Diabetes Mellitus Hemoglobin A1c 6.6 in 04/2021.  - On Tirzepatide at home. - Management per PCP.  Obstructive Sleep Apnea Compliant with CPAP. Continue  Morbid Obesity BMI 46.01. Patient biggest concern today is her weight. She seems motivated to make lifestyle changes and lose weight.  - [facility]   Tobacco Abuse Patient continues to smoke 1/2 pack per day.  - Discussed importance of complete cessation.   Disposition: Follow up with Dr. Tresa Endo in his sleep apnea clinic.    Medication Adjustments/Labs and Tests Ordered: Current medicines are reviewed at length with the patient today.  Concerns regarding medicines are outlined above.  Orders Placed This Encounter  Procedures   Amb Ref to [facility]   EKG 12-Lead   No orders of the defined types were placed in this encounter.   Patient Instructions  Medication Instructions:  Your physician recommends that you continue on your current medications as directed. Please refer to the Current Medication list given to you today.  *If you need a refill on your cardiac medications before your next appointment, please call your pharmacy*  Lab Work: NONE ordered at this time of appointment   If you have labs (blood work) drawn today and your tests are completely normal, you will receive your results only by: MyChart Message (if you have MyChart) OR A paper copy in the mail If  you have any lab test that is abnormal or we need to change your treatment, we will call you to review the results.  Testing/Procedures: You have been referred to [facility]    Follow-Up: At [facility], you and your health needs are our priority.  As part of our continuing mission to provide you with exceptional heart care, we have  created designated Provider Care Teams.  These Care Teams include your primary Cardiologist (physician) and Advanced Practice Providers (APPs -  Physician Assistants and Nurse Practitioners) who all work together to provide you with the care you need, when you need it.  Your next appointment:   Next available Sleep Clinic     The format for your next appointment:   In Person  Provider:   [name], MD    Other Instructions    Signed, Darreld Mclean, PA-C  10/17/2021 10:05 PM    [place]

## 2021-10-17 NOTE — Patient Instructions (Signed)
Medication Instructions:  Your physician recommends that you continue on your current medications as directed. Please refer to the Current Medication list given to you today.  *If you need a refill on your cardiac medications before your next appointment, please call your pharmacy*  Lab Work: NONE ordered at this time of appointment   If you have labs (blood work) drawn today and your tests are completely normal, you will receive your results only by: MyChart Message (if you have MyChart) OR A paper copy in the mail If you have any lab test that is abnormal or we need to change your treatment, we will call you to review the results.  Testing/Procedures: You have been referred to [facility]    Follow-Up: At [facility], you and your health needs are our priority.  As part of our continuing mission to provide you with exceptional heart care, we have created designated Provider Care Teams.  These Care Teams include your primary Cardiologist (physician) and Advanced Practice Providers (APPs -  Physician Assistants and Nurse Practitioners) who all work together to provide you with the care you need, when you need it.  Your next appointment:   Next available Sleep Clinic     The format for your next appointment:   In Person  Provider:   Nicki Gu[place], MD    Other Instructions

## 2021-10-26 ENCOUNTER — Ambulatory Visit (INDEPENDENT_AMBULATORY_CARE_PROVIDER_SITE_OTHER): Payer: 59 | Admitting: Pharmacist

## 2021-10-26 ENCOUNTER — Telehealth: Payer: Self-pay | Admitting: Specialist

## 2021-10-26 ENCOUNTER — Other Ambulatory Visit: Payer: Self-pay

## 2021-10-26 DIAGNOSIS — R110 Nausea: Secondary | ICD-10-CM | POA: Diagnosis not present

## 2021-10-26 DIAGNOSIS — Z6841 Body Mass Index (BMI) 40.0 and over, adult: Secondary | ICD-10-CM | POA: Diagnosis not present

## 2021-10-26 NOTE — Telephone Encounter (Signed)
Talked with patient and advised her of message below per James Owens.  Patient voiced that she understands. 

## 2021-10-26 NOTE — Assessment & Plan Note (Signed)
Patient has not met goal of at least 5% of body weight loss with comprehensive lifestyle modifications alone in the past 3-6 months. At this time will hold off on GLP1 until repeat labs drawn today. Will re-assess based on results. Following instruction patient verbalized understanding of treatment plan.    . Discontinued Mounjaro 40m once weekly . Comprehensive medication review performed, medication list updated in electronic medical record . Patient will adhere to dietary and exercise modifications . Patient will report any questions or concerns to provider

## 2021-10-26 NOTE — Telephone Encounter (Signed)
Pt called stating her neck and back has her in a lot of pain and she would like to get a note excusing her from work just for today. Pt would like a CB when this at the front desk for puck up please.   [phone]

## 2021-10-26 NOTE — Patient Instructions (Signed)
Monica Huang it was a pleasure seeing you today!  Today we discussed a plan for weight management  Stop Mounjaro for the time being until we receive results of your labwork Exercise goal: continue current exercises and I have included a list of sitting exercises below Diet goal: make appointment with dietitian  Follow-up with Dr. Janus Molder as needed  If you have any questions or concerns before your next appointment please call the clinic  Exercises to do While Sitting Exercises that you do while sitting (chair exercises) can give you many of the same benefits as full exercise. Benefits include strengthening your heart, burning calories, and keeping muscles and joints healthy. Exercise can also improve your mood and help with depression and anxiety. You may benefit from chair exercises if you are unable to do standing exercises due to: Diabetic foot pain. Obesity. Illness. Arthritis. Recovery from surgery or injury. Breathing problems. Balance problems. Another type of disability. Before starting chair exercises, check with your health care provider or a physical therapist to find out how much exercise you can tolerate and which exercises are safe for you. If your health care provider approves: Start out slowly and build up over time. Aim to work up to about 10-20 minutes for each exercise session. Make exercise part of your daily routine. Drink water when you exercise. Do not wait until you are thirsty. Drink every 10-15 minutes. Stop exercising right away if you have pain, nausea, shortness of breath, or dizziness. If you are exercising in a wheelchair, make sure to lock the wheels. Ask your health care provider whether you can do tai chi or yoga. Many positions in these mind-body exercises can be modified to do while seated. Warm-up Before starting other exercises: Sit up as straight as you can. Have your knees bent at 90 degrees, which is the shape of the capital letter "L." Keep  your feet flat on the floor. Sit at the front edge of your chair, if you can. Pull in (tighten) the muscles in your abdomen and stretch your spine and neck as straight as you can. Hold this position for a few minutes. Breathe in and out evenly. Try to concentrate on your breathing, and relax your mind. Stretching Exercise A: Arm stretch Hold your arms out straight in front of your body. Bend your hands at the wrist with your fingers pointing up, as if signaling someone to stop. Notice the slight tension in your forearms as you hold the position. Keeping your arms out and your hands bent, rotate your hands outward as far as you can and hold this stretch. Aim to have your thumbs pointing up and your pinkie fingers pointing down. Slowly repeat arm stretches for one minute as tolerated. Exercise B: Leg stretch If you can move your legs, try to "draw" letters on the floor with the toes of your foot. Write your name with one foot. Write your name with the toes of your other foot. Slowly repeat the movements for one minute as tolerated. Exercise C: Reach for the sky Reach your hands as far over your head as you can to stretch your spine. Move your hands and arms as if you are climbing a rope. Slowly repeat the movements for one minute as tolerated. Range of motion exercises Exercise A: Shoulder roll Let your arms hang loosely at your sides. Lift just your shoulders up toward your ears, then let them relax back down. When your shoulders feel loose, rotate your shoulders in backward and forward  circles. Do shoulder rolls slowly for one minute as tolerated. Exercise B: March in place As if you are marching, pump your arms and lift your legs up and down. Lift your knees as high as you can. If you are unable to lift your knees, just pump your arms and move your ankles and feet up and down. March in place for one minute as tolerated. Exercise C: Seated jumping jacks Let your arms hang down  straight. Keeping your arms straight, lift them up over your head. Aim to point your fingers to the ceiling. While you lift your arms, straighten your legs and slide your heels along the floor to your sides, as wide as you can. As you bring your arms back down to your sides, slide your legs back together. If you are unable to use your legs, just move your arms. Slowly repeat seated jumping jacks for one minute as tolerated. Strengthening exercises Exercise A: Shoulder squeeze Hold your arms straight out from your body to your sides, with your elbows bent and your fists pointed at the ceiling. Keeping your arms in the bent position, move them forward so your elbows and forearms meet in front of your face. Open your arms back out as wide as you can with your elbows still bent, until you feel your shoulder blades squeezing together. Hold for 5 seconds. Slowly repeat the movements forward and backward for one minute as tolerated. Contact a health care provider if: You have to stop exercising due to any of the following: Pain. Nausea. Shortness of breath. Dizziness. Fatigue. You have significant pain or soreness after exercising. Get help right away if: You have chest pain. You have difficulty breathing. These symptoms may represent a serious problem that is an emergency. Do not wait to see if the symptoms will go away. Get medical help right away. Call your local emergency services (911 in the [place] Do not drive yourself to the hospital. Summary Exercises that you do while sitting (chair exercises) can strengthen your heart, burn calories, and keep muscles and joints healthy. You may benefit from chair exercises if you are unable to do standing exercises due to diabetic foot pain, obesity, recovery from surgery or injury, or other conditions. Before starting chair exercises, check with your health care provider or a physical therapist to find out how much exercise you can tolerate and which  exercises are safe for you. This information is not intended to replace advice given to you by your health care provider. Make sure you discuss any questions you have with your health care provider. Document Revised: 01/09/2021 Document Reviewed: 01/09/2021 Elsevier Patient Education  2022 [employer].

## 2021-10-26 NOTE — Telephone Encounter (Signed)
Please advise on message below. Thank you!

## 2021-10-26 NOTE — Progress Notes (Signed)
   Subjective:    Patient ID: Monica Huang, female    DOB: [birth year], 40 y.o.   MRN: [id number]  HPI Patient is a 40 y.o. female who presents for 4 month weight management follow up. She is in good spirits and presents with assistance of white cane and help of family member.   She initiated use of Ozempic 4 months ago and at last visit was switched to [facility] 5mg . Patient seen on 09/27/21 by Dr. Volanda Napoleon and found to have elevated lipase. Patient was to discontinue GLP1 and repeat labs in one week. Patient unaware she was supposed to return to repeat labs and has taken 3 doses of Mounjaro since then.  Current meal patterns: Breakfast: biscuit or grits on weekends but otherwise skips Lunch: sandwich or sub Supper: mostly skips Snacks: nutty buddy, chips, snack cakes Drinks:coffee (sweetened with cream and/or sugar); water;   Current exercise: squats (10) every night and sit ups (50) every now and then Previously tried weight management pharmacotherapy: none Current weight management pharmacotherapy: Mounjaro 5mg  once weekly Patient states that She is taking her medications as prescribed. Patient reports adherence with medications.   Do you feel that your medications are working for you?  yes  Have you been experiencing any side effects to the medications prescribed? Yes; still having stomach pain  Do you have any problems obtaining medications due to transportation or finances?  no     Objective:   Physical Exam Constitutional:      Appearance: She is obese.  Neurological:     Mental Status: She is alert and oriented to person, place, and time.   Review of Systems  Gastrointestinal:  Positive for abdominal pain.    Vitals with BMI 10/26/2021 10/17/2021 10/05/2021  Height - 5\' 8"  5\' 9"   Weight 299 lbs 13 oz 302 lbs 10 oz 298 lbs 13 oz  BMI 45.59 00.86 76.1  Systolic - 950 932  Diastolic - 78 74  Pulse - 84 92     Lab Results  Component Value Date   HGBA1C 6.6 (H)  05/11/2021   HGBA1C 6.5 (A) 09/29/2020   HGBA1C 6.6 (H) 05/19/2020    Assessment/Plan:   Patient has not met goal of at least 5% of body weight loss with comprehensive lifestyle modifications alone in the past 3-6 months. At this time will hold off on GLP1 until repeat labs drawn today. Will re-assess based on results. Following instruction patient verbalized understanding of treatment plan.    Discontinued Mounjaro 5mg  once weekly Comprehensive medication review performed, medication list updated in electronic medical record Patient will adhere to dietary and exercise modifications Patient will report any questions or concerns to provider   This appointment required 10 minutes of direct patient care.  Thank you for involving pharmacy to assist in providing this patient's care.

## 2021-10-27 ENCOUNTER — Telehealth: Payer: Self-pay | Admitting: *Deleted

## 2021-10-27 LAB — LIPASE: Lipase: 154 U/L — ABNORMAL HIGH (ref 14–72)

## 2021-10-27 NOTE — Progress Notes (Signed)
FYI

## 2021-10-27 NOTE — Telephone Encounter (Signed)
Pt calling in wanting dr to call her about her lipase result. Please advise. Monica Huang Bruna Potter, CMA

## 2021-10-28 ENCOUNTER — Telehealth: Payer: Self-pay | Admitting: Family Medicine

## 2021-10-28 NOTE — Telephone Encounter (Signed)
Called patient to discuss results of recent Lipase that was ordered by pharmacy. Still elevated but has improved.  Likely secondary from GLP1.    Follow up with pharmacy and PCP  Dana Allan, MD [facility]

## 2021-10-28 NOTE — Telephone Encounter (Signed)
Done. Improving Lipase, likely from increase in GLP1 that was d/c'd.  She should stop GLP1/GIP.  Follow up with PCP and pharmacy as appropriate.  Dana Allan, MD Family Medicine Residency

## 2021-11-10 ENCOUNTER — Ambulatory Visit: Payer: 59 | Admitting: Cardiovascular Disease

## 2021-11-11 ENCOUNTER — Ambulatory Visit (INDEPENDENT_AMBULATORY_CARE_PROVIDER_SITE_OTHER): Payer: 59 | Admitting: Specialist

## 2021-11-11 ENCOUNTER — Ambulatory Visit: Payer: 59 | Admitting: Specialist

## 2021-11-11 ENCOUNTER — Other Ambulatory Visit: Payer: Self-pay

## 2021-11-11 ENCOUNTER — Encounter: Payer: Self-pay | Admitting: Specialist

## 2021-11-11 VITALS — BP 109/73 | HR 92 | Ht 68.0 in | Wt 300.0 lb

## 2021-11-11 DIAGNOSIS — M778 Other enthesopathies, not elsewhere classified: Secondary | ICD-10-CM | POA: Diagnosis not present

## 2021-11-11 DIAGNOSIS — M4712 Other spondylosis with myelopathy, cervical region: Secondary | ICD-10-CM | POA: Diagnosis not present

## 2021-11-11 DIAGNOSIS — Z981 Arthrodesis status: Secondary | ICD-10-CM | POA: Diagnosis not present

## 2021-11-11 DIAGNOSIS — G5602 Carpal tunnel syndrome, left upper limb: Secondary | ICD-10-CM | POA: Diagnosis not present

## 2021-11-11 DIAGNOSIS — M7542 Impingement syndrome of left shoulder: Secondary | ICD-10-CM

## 2021-11-11 MED ORDER — NAPROXEN 500 MG PO TABS: 500.0000 mg | ORAL_TABLET | Freq: Two times a day (BID) | ORAL | 1 refills | Status: DC

## 2021-11-11 NOTE — Patient Instructions (Addendum)
Avoid overhead lifting and overhead use of the arms. Pillows to keep from sleeping directly on the shoulders Limited lifting to less than 10 lbs. Ice or heat for relief. NSAIDs are helpful, such as alleve or motrin, be careful not to use in excess as they place burdens on the kidney. Stretching exercise help and strengthening is helpful to build endurance.  Carpal Tunnel Syndrome  Carpal tunnel syndrome is a condition that causes pain in your hand and arm. The carpal tunnel is a narrow area located on the palm side of your wrist. Repeated wrist motion or certain diseases may cause swelling within the tunnel. This swelling pinches the main nerve in the wrist (median nerve). What are the causes? This condition may be caused by: Repeated wrist motions. Wrist injuries. Arthritis. A cyst or tumor in the carpal tunnel. Fluid buildup during pregnancy. Sometimes the cause of this condition is not known. What increases the risk? This condition is more likely to develop in: People who have jobs that cause them to repeatedly move their wrists in the same motion, such as Health visitor. Women. People with certain conditions, such as: Diabetes. Obesity. An underactive thyroid (hypothyroidism). Kidney failure. What are the signs or symptoms? Symptoms of this condition include: A tingling feeling in your fingers, especially in your thumb, index, and middle fingers. Tingling or numbness in your hand. An aching feeling in your entire arm, especially when your wrist and elbow are bent for long periods of time. Wrist pain that goes up your arm to your shoulder. Pain that goes down into your palm or fingers. A weak feeling in your hands. You may have trouble grabbing and holding items. Your symptoms may feel worse during the night. How is this diagnosed? This condition is diagnosed with a medical history and physical exam. You may also have tests, including: An electromyogram (EMG). This test  measures electrical signals sent by your nerves into the muscles. X-rays. How is this treated? Treatment for this condition includes: Lifestyle changes. It is important to stop doing or modify the activity that caused your condition. Physical or occupational therapy. Medicines for pain and inflammation. This may include medicine that is injected into your wrist. A wrist splint. Surgery. Follow these instructions at home: If you have a splint:  Wear it as told by your health care provider. Remove it only as told by your health care provider. Loosen the splint if your fingers become numb and tingle, or if they turn cold and blue. Keep the splint clean and dry. General instructions  Take over-the-counter and prescription medicines only as told by your health care provider. Rest your wrist from any activity that may be causing your pain. If your condition is work related, talk to your employer about changes that can be made, such as getting a wrist pad to use while typing. If directed, apply ice to the painful area: Put ice in a plastic bag. Place a towel between your skin and the bag. Leave the ice on for 20 minutes, 2-3 times per day. Keep all follow-up visits as told by your health care provider. This is important. Do any exercises as told by your health care provider, physical therapist, or occupational therapist. Contact a health care provider if: You have new symptoms. Your pain is not controlled with medicines. Your symptoms get worse. This information is not intended to replace advice given to you by your health care provider. Make sure you discuss any questions you have with your health  care provider. Document Released: 11/10/2000 Document Revised: 03/23/2016 Document Reviewed: 07/25/2017 Elsevier Interactive Patient Education  2017 [employer].  Avoid overhead lifting and overhead use of the arms. Do not lift greater than 5 lbs. Adjust head rest in vehicle to prevent  hyperextension if rear ended. Take extra precautions to avoid falling. Avoid frequent bending and stooping  No lifting greater than 10 lbs. May use ice or moist heat for pain. Weight loss is of benefit. Best medication for lumbar disc disease is arthritis medications like motrin, celebrex and naprosyn. Exercise is important to improve your indurance and does allow people to function better inspite of back pain.

## 2021-11-11 NOTE — Progress Notes (Signed)
Office Visit Note   Patient: Monica Huang           Date of Birth: 01-03-1981           MRN: [id number] Visit Date: 11/11/2021              Requested by: [name], [address] [place],  [place] [address] PCP: [name], DO   Assessment & Plan: Visit Diagnoses: No diagnosis found.  Plan: Avoid overhead lifting and overhead use of the arms. Pillows to keep from sleeping directly on the shoulders Limited lifting to less than 10 lbs. Ice or heat for relief. NSAIDs are helpful, such as alleve or motrin, be careful not to use in excess as they place burdens on the kidney. Stretching exercise help and strengthening is helpful to build endurance.  Carpal Tunnel Syndrome  Carpal tunnel syndrome is a condition that causes pain in your hand and arm. The carpal tunnel is a narrow area located on the palm side of your wrist. Repeated wrist motion or certain diseases may cause swelling within the tunnel. This swelling pinches the main nerve in the wrist (median nerve). What are the causes? This condition may be caused by: Repeated wrist motions. Wrist injuries. Arthritis. A cyst or tumor in the carpal tunnel. Fluid buildup during pregnancy. Sometimes the cause of this condition is not known. What increases the risk? This condition is more likely to develop in: People who have jobs that cause them to repeatedly move their wrists in the same motion, such as Art gallery manager. Women. People with certain conditions, such as: Diabetes. Obesity. An underactive thyroid (hypothyroidism). Kidney failure. What are the signs or symptoms? Symptoms of this condition include: A tingling feeling in your fingers, especially in your thumb, index, and middle fingers. Tingling or numbness in your hand. An aching feeling in your entire arm, especially when your wrist and elbow are bent for long periods of time. Wrist pain that goes up your arm to your shoulder. Pain that  goes down into your palm or fingers. A weak feeling in your hands. You may have trouble grabbing and holding items. Your symptoms may feel worse during the night. How is this diagnosed? This condition is diagnosed with a medical history and physical exam. You may also have tests, including: An electromyogram (EMG). This test measures electrical signals sent by your nerves into the muscles. X-rays. How is this treated? Treatment for this condition includes: Lifestyle changes. It is important to stop doing or modify the activity that caused your condition. Physical or occupational therapy. Medicines for pain and inflammation. This may include medicine that is injected into your wrist. A wrist splint. Surgery. Follow these instructions at home: If you have a splint:  Wear it as told by your health care provider. Remove it only as told by your health care provider. Loosen the splint if your fingers become numb and tingle, or if they turn cold and blue. Keep the splint clean and dry. General instructions  Take over-the-counter and prescription medicines only as told by your health care provider. Rest your wrist from any activity that may be causing your pain. If your condition is work related, talk to your employer about changes that can be made, such as getting a wrist pad to use while typing. If directed, apply ice to the painful area: Put ice in a plastic bag. Place a towel between your skin and the bag. Leave the ice on for 20 minutes, 2-3  times per day. Keep all follow-up visits as told by your health care provider. This is important. Do any exercises as told by your health care provider, physical therapist, or occupational therapist. Contact a health care provider if: You have new symptoms. Your pain is not controlled with medicines. Your symptoms get worse. This information is not intended to replace advice given to you by your health care provider. Make sure you discuss any  questions you have with your health care provider. Document Released: 11/10/2000 Document Revised: 03/23/2016 Document Reviewed: 07/25/2017 Elsevier Interactive Patient Education  2017 [employer].  Avoid overhead lifting and overhead use of the arms. Do not lift greater than 5 lbs. Adjust head rest in vehicle to prevent hyperextension if rear ended. Take extra precautions to avoid falling. Avoid overhead lifting and overhead use of the arms. Do not lift greater than 5 lbs. Adjust head rest in vehicle to prevent hyperextension if rear ended. Take extra precautions to avoid falling. Avoid frequent bending and stooping  No lifting greater than 10 lbs. May use ice or moist heat for pain. Weight loss is of benefit. Best medication for lumbar disc disease is arthritis medications like motrin, celebrex and naprosyn. Exercise is important to improve your indurance and does allow people to function better inspite of back  Follow-Up Instructions: No follow-ups on file.   Orders:  No orders of the defined types were placed in this encounter.  No orders of the defined types were placed in this encounter.     Procedures: No procedures performed   Clinical Data: No additional findings.   Subjective: Chief Complaint  Patient presents with   Neck - Follow-up   Left Shoulder - Pain   Left Hand - Pain   Right Wrist - Pain    40 year old female with history of pseudocerebrei, she has undergone previous ACDF  in 04/2020. 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 She is doing a lot of movements ripping the boxes up, not heavy and she reports that she was in a fold up chair and that pretty much set every thing off. She is working 8 hour days 5 days a week. She has no FMLA and she reports that she decides on her own breaks. Needs for the employer to  know her limitations. Back hurts from sitting for a long period. No bowel or bladder difficulty. Left shoulder is painful over the upper outer shoulder and there is pain with lying on the left shoulder.    Review of Systems  Constitutional:  Positive for activity change. Negative for appetite change, chills, diaphoresis, fatigue, fever and unexpected weight change.  HENT:  Positive for sore throat and tinnitus. Negative for congestion, dental problem, drooling, ear discharge, ear pain, facial swelling, hearing loss, mouth sores, nosebleeds, postnasal drip, rhinorrhea, sinus pressure, sinus pain, sneezing, trouble swallowing and voice change.   Eyes:  Positive for visual disturbance. Negative for photophobia, pain, discharge, redness and itching.  Respiratory:  Positive for apnea (uses a sleep apnea machine CPAP). Negative for cough, choking, chest tightness, shortness of breath, wheezing and stridor.   Cardiovascular:  Positive for chest pain (Left anterior upper chest discomfort). Negative for palpitations and leg swelling.  Gastrointestinal: Negative.  Negative for abdominal distention, abdominal pain, anal bleeding, blood in stool, constipation, diarrhea, nausea, rectal pain and vomiting.  Endocrine: Negative.  Negative for cold intolerance, heat intolerance, polydipsia, polyphagia and polyuria.  Genitourinary: Negative.  Negative for decreased urine volume, difficulty urinating, dyspareunia, dysuria, enuresis, flank pain, frequency, genital sores, hematuria, menstrual problem, pelvic pain, urgency, vaginal bleeding, vaginal discharge and vaginal pain.  Musculoskeletal:  Positive for back pain. Negative for arthralgias, gait problem, joint swelling, myalgias, neck pain and neck stiffness.  Skin: Negative.  Negative for color change, pallor, rash and wound.  Allergic/Immunologic: Negative.  Negative for environmental allergies, food allergies and immunocompromised state.  Neurological:  Positive for  weakness and numbness (Right lateral whole leg numbness and tingling). Negative for dizziness, tremors, seizures, syncope, facial asymmetry, speech difficulty, light-headedness and headaches.  Hematological:  Negative for adenopathy. Does not bruise/bleed easily.  Psychiatric/Behavioral: Negative.  Negative for agitation, behavioral problems, confusion, decreased concentration, dysphoric mood, hallucinations, self-injury, sleep disturbance and suicidal ideas. The patient is not nervous/anxious and is not hyperactive.     Objective: Vital Signs: BP 109/73 (BP Location: Left Arm, Patient Position: Sitting)    Pulse 92    Ht 5\' 8"  (1.727 m)    Wt 300 lb (136.1 kg)    BMI 45.61 kg/m   Physical Exam Constitutional:      Appearance: She is well-developed.  HENT:     Head: Normocephalic and atraumatic.  Eyes:     Pupils: Pupils are equal, round, and reactive to light.  Pulmonary:     Effort: Pulmonary effort is normal.     Breath sounds: Normal breath sounds.  Abdominal:     General: Bowel sounds are normal.     Palpations: Abdomen is soft.  Musculoskeletal:     Cervical back: Normal range of motion and neck supple.  Skin:    General: Skin is warm and dry.  Neurological:     Mental Status: She is alert and oriented to person, place, and time.  Psychiatric:        Behavior: Behavior normal.        Thought Content: Thought content normal.        Judgment: Judgment normal.    Back Exam   Tenderness  The patient is experiencing tenderness in the cervical.  Range of Motion  Extension:  abnormal  Flexion:  normal  Lateral bend right:  normal  Lateral bend left:  abnormal  Rotation right:  normal  Rotation left:  abnormal   Muscle Strength  Right Quadriceps:  5/5  Left Quadriceps:  5/5  Right Hamstrings:  5/5  Left Hamstrings:  5/5      Specialty Comments:  No specialty comments available.  Imaging: No results found.   PMFS History: Patient Active Problem List    Diagnosis Date Noted   Herniation of cervical intervertebral disc with radiculopathy 05/25/2020    Priority: High    Class: Chronic   Spondylosis without myelopathy or radiculopathy, cervical region 05/25/2020    Priority: High    Class: Chronic   Nausea 09/30/2021   Shortness of breath 09/01/2021   Skin lesion of chest wall 06/30/2021   Elevated blood pressure reading 10/26/2020   Diabetes mellitus without complication ([place])    [id number] vaccine administered    Vaginal itching 06/05/2020   S/P cervical spinal fusion 05/26/2020   Status post cervical spinal fusion 05/25/2020   Suspected sleep apnea 03/11/2020   Palpitations 12/23/2019   Chronic pain of both knees 10/23/2019   Neck and shoulder pain 07/04/2019   Close exposure to COVID-19 virus 06/25/2019   Left knee  pain 06/10/2019   Viral upper respiratory infection 05/22/2019   Cervical radiculitis 03/10/2019   Prediabetes 02/06/2019   Carpal tunnel syndrome 02/03/2019   Vaginal discharge 07/05/2016   Anxiety and depression 06/10/2016   Other optic atrophy, 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   Essential hypertension, benign 05/10/2015   Dizziness 09/30/2014   Mechanical complication-ventricular(CSF) communicating shunt ([place]) 03/05/2013   Hidradenitis suppurativa 10/15/2012   Right hand pain 02/13/2012   Tobacco abuse 01/03/2012   Legally blind 01/03/2012   Obesity 01/03/2012   Healthcare maintenance 01/03/2012   Marijuana smoker 01/03/2012   Past Medical History:  Diagnosis Date   Ambulates with cane    Chronic back pain    Chronic leg pain    Diabetes mellitus without complication (HCC)    Type II - no meds   Dizziness    Hydradenitis    Hyperlipidemia    diet controlle - no meds   Legally blind    uses cane - some limited vision   Macular degeneration    patient denies this dx  (dr street dx)   Pneumonia    x 1   Pseudotumor cerebri syndrome 2005   shunt placed- and legally blind   Sciatica    Sleep apnea    "Mild" -  has appt on 06/23/20 to see about cpap machine   Wears partial dentures    upper and lower    Family History  Problem Relation Age of Onset   Diabetes Mother    Hypertension Mother    Diabetes Father     Past Surgical History:  Procedure Laterality Date   ANTERIOR CERVICAL DECOMP/DISCECTOMY FUSION N/A 05/25/2020   Procedure: ANTERIOR CERVICAL DISCECTOMY AND FUSION CERVICAL FIVE THROUGH CERVICAL SIX WITH PLATES, SCREWS, ALLOGRAFT AND LOCAL BONE GRAFT, VIVIGEN;  Surgeon: Jessy Oto, MD;  Location: [facility];  Service: Orthopedics;  Laterality: N/A;   CSF SHUNT     2 revisions   MULTIPLE EXTRACTIONS WITH ALVEOLOPLASTY Bilateral 11/02/2017   Procedure: MULTIPLE EXTRACTION;  Surgeon: Diona Browner, DDS;  Location: [facility];  Service: Oral Surgery;  Laterality: Bilateral;   Social History   Occupational History   Not on file  Tobacco Use   Smoking status: Every Day    Packs/day: 0.50    Years: 21.00    Pack years: 10.50    Types: Cigarettes   Smokeless tobacco: Never  Vaping Use   Vaping Use: Never used  Substance and Sexual Activity   Alcohol use: Yes    Comment: occasional   Drug use: Yes    Types: Marijuana    Comment: marijuana -- bag per day. - -4 joints/day   Sexual activity: Yes    Birth control/protection: None    Comment: 1 partners

## 2021-11-30 ENCOUNTER — Other Ambulatory Visit: Payer: Self-pay

## 2021-11-30 ENCOUNTER — Ambulatory Visit (INDEPENDENT_AMBULATORY_CARE_PROVIDER_SITE_OTHER): Payer: 59 | Admitting: Physical Medicine and Rehabilitation

## 2021-11-30 ENCOUNTER — Encounter: Payer: Self-pay | Admitting: Physical Medicine and Rehabilitation

## 2021-11-30 DIAGNOSIS — R202 Paresthesia of skin: Secondary | ICD-10-CM | POA: Diagnosis not present

## 2021-11-30 NOTE — Progress Notes (Signed)
Pt state left hand and wrist numbness. Pt state she doesn't know what cause the numbness but she uses pain cream to help ease her pain.  Numeric Pain Rating Scale and Functional Assessment Average Pain 7   In the last MONTH (on 0-10 scale) has pain interfered with the following?  1. General activity like being  able to carry out your everyday physical activities such as walking, climbing stairs, carrying groceries, or moving a chair?  Rating(10)

## 2021-12-01 ENCOUNTER — Ambulatory Visit: Payer: 59 | Admitting: Cardiovascular Disease

## 2021-12-01 NOTE — Progress Notes (Signed)
Monica Huang - 41 y.o. female MRN [id number]  Date of birth: 1981-06-01  Office Visit Note: Visit Date: 11/30/2021 PCP: [name], DO Referred by: Jessy Oto, MD  Subjective: Chief Complaint  Patient presents with   Left Hand - Numbness   Left Wrist - Numbness   HPI:  Monica Huang is a 41 y.o. female who comes in today at the request of Dr. Basil Dess for electrodiagnostic study of the Bilateral upper extremities.  Patient is Right hand dominant.  She essentially reports mainly left hand numbness and tingling and paresthesia in a global nondermatomal fashion.  She denies any real frank radicular symptoms but does have left shoulder pain.  She really denies symptoms on the right.  Dr. Louanne Skye did want Bilateral electrodiagnostic study.  She has had a history of prior ACDF in 2021.  She is diabetic.  ROS Otherwise per HPI.  Assessment & Plan: Visit Diagnoses:    ICD-10-CM   1. Paresthesia of skin  R20.2 NCV with EMG (electromyography)      Plan: Impression: The above electrodiagnostic study is ABNORMAL and reveals evidence of a mild right median nerve entrapment at the wrist (carpal tunnel syndrome) affecting sensory components. No left sided findings. This test cannot rule out sensory only radiculopathy or radiculitis.  There is no significant electrodiagnostic evidence of any other focal nerve entrapment, brachial plexopathy or cervical radiculopathy in either upper limb.   Recommendations: 1.  Follow-up with referring physician. 2.  Continue current management of symptoms.  Meds & Orders: No orders of the defined types were placed in this encounter.   Orders Placed This Encounter  Procedures   NCV with EMG (electromyography)    Follow-up: Return in about 2 weeks (around 12/14/2021) for Basil Dess, MD.   Procedures: No procedures performed  EMG & NCV Findings: Evaluation of the right median (across palm) sensory nerve showed prolonged distal peak latency  (Wrist, 3.9 ms).  All remaining nerves (as indicated in the following tables) were within normal limits.  All left vs. right side differences were within normal limits.    All examined muscles (as indicated in the following table) showed no evidence of electrical instability.    Impression: The above electrodiagnostic study is ABNORMAL and reveals evidence of a mild right median nerve entrapment at the wrist (carpal tunnel syndrome) affecting sensory components. No left sided findings. This test cannot rule out sensory only radiculopathy or radiculitis.  There is no significant electrodiagnostic evidence of any other focal nerve entrapment, brachial plexopathy or cervical radiculopathy in either upper limb.   Recommendations: 1.  Follow-up with referring physician. 2.  Continue current management of symptoms.  ___________________________ Laurence Spates FAAPMR Board Certified, American Board of Physical Medicine and Rehabilitation    Nerve Conduction Studies Anti Sensory Summary Table   Stim Site NR Peak (ms) Norm Peak (ms) P-T Amp (V) Norm P-T Amp Site1 Site2 Delta-P (ms) Dist (cm) Vel (m/s) Norm Vel (m/s)  Left Median Acr Palm Anti Sensory (2nd Digit)  32.6C  Wrist    3.6 <3.6 28.5 >10 Wrist Palm 1.8 0.0    Palm    1.8 <2.0 28.2         Right Median Acr Palm Anti Sensory (2nd Digit)  32C  Wrist    *3.9 <3.6 26.4 >10 Wrist Palm 2.2 0.0    Palm    1.7 <2.0 20.4         Left Radial Anti Sensory (Base 1st Digit)  32.[place]  Wrist    1.9 <3.1 29.9  Wrist Base 1st Digit 1.9 0.0    Right Radial Anti Sensory (Base 1st Digit)  32.2C  Wrist    2.0 <3.1 27.4  Wrist Base 1st Digit 2.0 0.0    Left Ulnar Anti Sensory (5th Digit)  32.8C  Wrist    2.9 <3.7 15.1 >15.0 Wrist 5th Digit 2.9 14.0 48 >38  Right Ulnar Anti Sensory (5th Digit)  32.5C  Wrist    2.8 <3.7 27.3 >15.0 Wrist 5th Digit 2.8 14.0 50 >38   Motor Summary Table   Stim Site NR Onset (ms) Norm Onset (ms) O-P Amp (mV) Norm O-P Amp  Site1 Site2 Delta-0 (ms) Dist (cm) Vel (m/s) Norm Vel (m/s)  Left Median Motor (Abd Poll Brev)  32.6C  Wrist    3.1 <4.2 10.0 >5 Elbow Wrist 4.4 24.0 55 >50  Elbow    7.5  10.2         Right Median Motor (Abd Poll Brev)  32.3C  Wrist    3.8 <4.2 7.8 >5 Elbow Wrist 4.6 24.5 53 >50  Elbow    8.4  9.3         Left Ulnar Motor (Abd Dig Min)  32.7C  Wrist    2.7 <4.2 7.8 >3 B Elbow Wrist 3.6 22.0 61 >53  B Elbow    6.3  8.0  A Elbow B Elbow 1.2 12.0 100 >53  A Elbow    7.5  7.8         Right Ulnar Motor (Abd Dig Min)  32.2C  Wrist    2.6 <4.2 9.1 >3 B Elbow Wrist 3.4 23.0 68 >53  B Elbow    6.0  8.4  A Elbow B Elbow 1.4 12.0 86 >53  A Elbow    7.4  6.5          EMG   Side Muscle Nerve Root Ins Act Fibs Psw Amp Dur Poly Recrt Int Fraser Din Comment  Left Abd Poll Brev Median C8-T1 Nml Nml Nml Nml Nml 0 Nml Nml   Left 1stDorInt Ulnar C8-T1 Nml Nml Nml Nml Nml 0 Nml Nml   Left PronatorTeres Median C6-7 Nml Nml Nml Nml Nml 0 Nml Nml   Left Biceps Musculocut C5-6 Nml Nml Nml Nml Nml 0 Nml Nml   Left Deltoid Axillary C5-6 Nml Nml Nml Nml Nml 0 Nml Nml     Nerve Conduction Studies Anti Sensory Left/Right Comparison   Stim Site L Lat (ms) R Lat (ms) L-R Lat (ms) L Amp (V) R Amp (V) L-R Amp (%) Site1 Site2 L Vel (m/s) R Vel (m/s) L-R Vel (m/s)  Median Acr Palm Anti Sensory (2nd Digit)  32.6C  Wrist 3.6 *3.9 0.3 28.5 26.4 7.4 Wrist Palm     Palm 1.8 1.7 0.1 28.2 20.4 27.7       Radial Anti Sensory (Base 1st Digit)  32.5C  Wrist 1.9 2.0 0.1 29.9 27.4 8.4 Wrist Base 1st Digit     Ulnar Anti Sensory (5th Digit)  32.8C  Wrist 2.9 2.8 0.1 15.1 27.3 44.7 Wrist 5th Digit 48 50 2   Motor Left/Right Comparison   Stim Site L Lat (ms) R Lat (ms) L-R Lat (ms) L Amp (mV) R Amp (mV) L-R Amp (%) Site1 Site2 L Vel (m/s) R Vel (m/s) L-R Vel (m/s)  Median Motor (Abd Poll Brev)  32.6C  Wrist 3.1 3.8 0.7 10.0 7.8 22.0 Elbow Wrist 55 53 2  Elbow  7.5 8.4 0.9 10.2 9.3 8.8       Ulnar Motor (Abd Dig Min)   32.7C  Wrist 2.7 2.6 0.1 7.8 9.1 14.3 B Elbow Wrist 61 68 7  B Elbow 6.3 6.0 0.3 8.0 8.4 4.8 A Elbow B Elbow 100 86 14  A Elbow 7.5 7.4 0.1 7.8 6.5 16.7          Waveforms:                      Clinical History: No specialty comments available.     Objective:  VS:  HT:     WT:    BMI:      BP:    HR: bpm   TEMP: ( )   RESP:  Physical Exam Constitutional:      Appearance: She is obese.  Musculoskeletal:        General: No swelling, tenderness or deformity.     Comments: Inspection reveals no atrophy of the bilateral APB or FDI or hand intrinsics. There is no swelling, color changes, allodynia or dystrophic changes. There is 5 out of 5 strength in the bilateral wrist extension, finger abduction and long finger flexion. There is intact sensation to light touch in all dermatomal and peripheral nerve distributions. There is a negative Phalen's test bilaterally. There is a negative Hoffmann's test bilaterally.  Skin:    General: Skin is warm and dry.     Findings: No erythema or rash.  Neurological:     General: No focal deficit present.     Mental Status: She is alert and oriented to person, place, and time.     Motor: No weakness or abnormal muscle tone.     Coordination: Coordination normal.  Psychiatric:        Mood and Affect: Mood normal.        Behavior: Behavior normal.     Imaging: No results found.

## 2021-12-01 NOTE — Procedures (Signed)
EMG & NCV Findings: Evaluation of the right median (across palm) sensory nerve showed prolonged distal peak latency (Wrist, 3.9 ms).  All remaining nerves (as indicated in the following tables) were within normal limits.  All left vs. right side differences were within normal limits.    All examined muscles (as indicated in the following table) showed no evidence of electrical instability.    Impression: The above electrodiagnostic study is ABNORMAL and reveals evidence of a mild right median nerve entrapment at the wrist (carpal tunnel syndrome) affecting sensory components. No left sided findings. This test cannot rule out sensory only radiculopathy or radiculitis.  There is no significant electrodiagnostic evidence of any other focal nerve entrapment, brachial plexopathy or cervical radiculopathy in either upper limb.   Recommendations: 1.  Follow-up with referring physician. 2.  Continue current management of symptoms.  ___________________________ Monica Huang FAAPMR Board Certified, American Board of Physical Medicine and Rehabilitation    Nerve Conduction Studies Anti Sensory Summary Table   Stim Site NR Peak (ms) Norm Peak (ms) P-T Amp (V) Norm P-T Amp Site1 Site2 Delta-P (ms) Dist (cm) Vel (m/s) Norm Vel (m/s)  Left Median Acr Palm Anti Sensory (2nd Digit)  32.6C  Wrist    3.6 <3.6 28.5 >10 Wrist Palm 1.8 0.0    Palm    1.8 <2.0 28.2         Right Median Acr Palm Anti Sensory (2nd Digit)  32C  Wrist    *3.9 <3.6 26.4 >10 Wrist Palm 2.2 0.0    Palm    1.7 <2.0 20.4         Left Radial Anti Sensory (Base 1st Digit)  32.5C  Wrist    1.9 <3.1 29.9  Wrist Base 1st Digit 1.9 0.0    Right Radial Anti Sensory (Base 1st Digit)  32.2C  Wrist    2.0 <3.1 27.4  Wrist Base 1st Digit 2.0 0.0    Left Ulnar Anti Sensory (5th Digit)  32.8C  Wrist    2.9 <3.7 15.1 >15.0 Wrist 5th Digit 2.9 14.0 48 >38  Right Ulnar Anti Sensory (5th Digit)  32.5C  Wrist    2.8 <3.7 27.3 >15.0 Wrist  5th Digit 2.8 14.0 50 >38   Motor Summary Table   Stim Site NR Onset (ms) Norm Onset (ms) O-P Amp (mV) Norm O-P Amp Site1 Site2 Delta-0 (ms) Dist (cm) Vel (m/s) Norm Vel (m/s)  Left Median Motor (Abd Poll Brev)  32.6C  Wrist    3.1 <4.2 10.0 >5 Elbow Wrist 4.4 24.0 55 >50  Elbow    7.5  10.2         Right Median Motor (Abd Poll Brev)  32.3C  Wrist    3.8 <4.2 7.8 >5 Elbow Wrist 4.6 24.5 53 >50  Elbow    8.4  9.3         Left Ulnar Motor (Abd Dig Min)  32.7C  Wrist    2.7 <4.2 7.8 >3 B Elbow Wrist 3.6 22.0 61 >53  B Elbow    6.3  8.0  A Elbow B Elbow 1.2 12.0 100 >53  A Elbow    7.5  7.8         Right Ulnar Motor (Abd Dig Min)  32.2C  Wrist    2.6 <4.2 9.1 >3 B Elbow Wrist 3.4 23.0 68 >53  B Elbow    6.0  8.4  A Elbow B Elbow 1.4 12.0 86 >53  A Elbow  7.4  6.5          EMG   Side Muscle Nerve Root Ins Act Fibs Psw Amp Dur Poly Recrt Int Dennie Bible Comment  Left Abd Poll Brev Median C8-T1 Nml Nml Nml Nml Nml 0 Nml Nml   Left 1stDorInt Ulnar C8-T1 Nml Nml Nml Nml Nml 0 Nml Nml   Left PronatorTeres Median C6-7 Nml Nml Nml Nml Nml 0 Nml Nml   Left Biceps Musculocut C5-6 Nml Nml Nml Nml Nml 0 Nml Nml   Left Deltoid Axillary C5-6 Nml Nml Nml Nml Nml 0 Nml Nml     Nerve Conduction Studies Anti Sensory Left/Right Comparison   Stim Site L Lat (ms) R Lat (ms) L-R Lat (ms) L Amp (V) R Amp (V) L-R Amp (%) Site1 Site2 L Vel (m/s) R Vel (m/s) L-R Vel (m/s)  Median Acr Palm Anti Sensory (2nd Digit)  32.6C  Wrist 3.6 *3.9 0.3 28.5 26.4 7.4 Wrist Palm     Palm 1.8 1.7 0.1 28.2 20.4 27.7       Radial Anti Sensory (Base 1st Digit)  32.5C  Wrist 1.9 2.0 0.1 29.9 27.4 8.4 Wrist Base 1st Digit     Ulnar Anti Sensory (5th Digit)  32.8C  Wrist 2.9 2.8 0.1 15.1 27.3 44.7 Wrist 5th Digit 48 50 2   Motor Left/Right Comparison   Stim Site L Lat (ms) R Lat (ms) L-R Lat (ms) L Amp (mV) R Amp (mV) L-R Amp (%) Site1 Site2 L Vel (m/s) R Vel (m/s) L-R Vel (m/s)  Median Motor (Abd Poll Brev)  32.6C   Wrist 3.1 3.8 0.7 10.0 7.8 22.0 Elbow Wrist 55 53 2  Elbow 7.5 8.4 0.9 10.2 9.3 8.8       Ulnar Motor (Abd Dig Min)  32.7C  Wrist 2.7 2.6 0.1 7.8 9.1 14.3 B Elbow Wrist 61 68 7  B Elbow 6.3 6.0 0.3 8.0 8.4 4.8 A Elbow B Elbow 100 86 14  A Elbow 7.5 7.4 0.1 7.8 6.5 16.7          Waveforms:

## 2021-12-07 ENCOUNTER — Other Ambulatory Visit: Payer: Self-pay

## 2021-12-07 ENCOUNTER — Encounter: Payer: Self-pay | Admitting: Specialist

## 2021-12-07 ENCOUNTER — Ambulatory Visit: Payer: Self-pay

## 2021-12-07 ENCOUNTER — Ambulatory Visit (INDEPENDENT_AMBULATORY_CARE_PROVIDER_SITE_OTHER): Payer: 59 | Admitting: Specialist

## 2021-12-07 VITALS — BP 121/70 | HR 93 | Ht 68.0 in | Wt 300.0 lb

## 2021-12-07 DIAGNOSIS — M778 Other enthesopathies, not elsewhere classified: Secondary | ICD-10-CM | POA: Diagnosis not present

## 2021-12-07 DIAGNOSIS — M4802 Spinal stenosis, cervical region: Secondary | ICD-10-CM | POA: Diagnosis not present

## 2021-12-07 DIAGNOSIS — M7541 Impingement syndrome of right shoulder: Secondary | ICD-10-CM

## 2021-12-07 DIAGNOSIS — M25512 Pain in left shoulder: Secondary | ICD-10-CM

## 2021-12-07 DIAGNOSIS — M542 Cervicalgia: Secondary | ICD-10-CM

## 2021-12-07 NOTE — Patient Instructions (Signed)
°  Monica Huang was seen in my clinic on 12/07/2021. She may return to work on 12/30/2021. Avoid overhead lifting and overhead use of the arms. Do not lift greater than 5 lbs. Adjust head rest in vehicle to prevent hyperextension if rear ended. Take extra precautions to avoid falling, including use of a cane if you feel weak.  Myelogram and post myelogram CT scan of the cervical spine. MRI of the left shoulder.

## 2021-12-07 NOTE — Progress Notes (Signed)
Office Visit Note   Patient: Monica Huang           Date of Birth: 05-19-1981           MRN: [id number] Visit Date: 12/07/2021              Requested by: Lavonda Jumbo, DO [address] [place],  [place] [address] PCP: Lavonda Jumbo, DO   Assessment & Plan: Visit Diagnoses:  1. Left shoulder tendonitis   2. Impingement syndrome of right shoulder   3. Left shoulder pain, unspecified chronicity   4. Spinal stenosis of cervical region     Plan: Monica Huang was seen in my clinic on 12/07/2021. She may return to work on 12/30/2021. Avoid overhead lifting and overhead use of the arms. Do not lift greater than 5 lbs. Adjust head rest in vehicle to prevent hyperextension if rear ended. Take extra precautions to avoid falling, including use of a cane if you feel weak.  Myelogram and post myelogram CT scan of the cervical spine. MRI of the left shoulder.   Follow-Up Instructions: No follow-ups on file.   Orders:  Orders Placed This Encounter  Procedures   XR Shoulder Left   No orders of the defined types were placed in this encounter.     Procedures: No procedures performed   Clinical Data: Findings:  MRI of the cervical spine from 2022 shows cervical canal narrowing C4-5 through C6-7, fusion at C5-6. AP diameter is narrowed to 5-7 mm, normal is 10 mm so that  Cervical stenosis is apparent. Report is not consistent with the study I will recommentd cervical myelogram. Left shoulder MRI.    Subjective: Chief Complaint  Patient presents with   Left Arm - Follow-up    EMG/NCS Review    41 year old female right handed with painful shoulder and neck. History of cervical fusion 6/21, she is experiencing left shoulder pain, pain with ROM and overhead use of the left arm. Pain in the neck also and feelings of weakness   Review of Systems   Objective: Vital Signs: BP 121/70 (BP Location: Right Arm, Patient Position: Sitting)    Pulse 93    Ht 5\' 8"  (1.727 m)     Wt 300 lb (136.1 kg)    BMI 45.61 kg/m   Physical Exam  Ortho Exam  Specialty Comments:  No specialty comments available.  Imaging: No results found.   PMFS History: Patient Active Problem List   Diagnosis Date Noted   Herniation of cervical intervertebral disc with radiculopathy 05/25/2020    Priority: High    Class: Chronic   Spondylosis without myelopathy or radiculopathy, cervical region 05/25/2020    Priority: High    Class: Chronic   Nausea 09/30/2021   Shortness of breath 09/01/2021   Skin lesion of chest wall 06/30/2021   Elevated blood pressure reading 10/26/2020   Diabetes mellitus without complication (HCC)    COVID-19 vaccine administered    Vaginal itching 06/05/2020   S/P cervical spinal fusion 05/26/2020   Status post cervical spinal fusion 05/25/2020   Suspected sleep apnea 03/11/2020   Palpitations 12/23/2019   Chronic pain of both knees 10/23/2019   Neck and shoulder pain 07/04/2019   Close exposure to COVID-19 virus 06/25/2019   Left knee pain 06/10/2019   Viral upper respiratory infection 05/22/2019   Cervical radiculitis 03/10/2019   Prediabetes 02/06/2019   Carpal tunnel syndrome 02/03/2019   Vaginal discharge 07/05/2016   Anxiety and depression 06/10/2016  Other optic atrophy, 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   Essential hypertension, benign 05/10/2015   Dizziness 09/30/2014   Mechanical complication-ventricular(CSF) communicating shunt (HCC) 03/05/2013   Hidradenitis suppurativa 10/15/2012   Right hand pain 02/13/2012   Tobacco abuse 01/03/2012   Legally blind 01/03/2012   Obesity 01/03/2012   Healthcare maintenance 01/03/2012   Marijuana smoker 01/03/2012   Past Medical History:  Diagnosis Date   Ambulates with cane    Chronic back pain    Chronic leg pain    Diabetes mellitus without complication (HCC)    Type II - no meds   Dizziness    Hydradenitis    Hyperlipidemia    diet controlle - no meds   Legally blind    uses cane - some  limited vision   Macular degeneration    patient denies this dx  (dr street dx)   Pneumonia    x 1   Pseudotumor cerebri syndrome 2005   shunt placed- and legally blind   Sciatica    Sleep apnea    "Mild" - has appt on 06/23/20 to see about cpap machine   Wears partial dentures    upper and lower    Family History  Problem Relation Age of Onset   Diabetes Mother    Hypertension Mother    Diabetes Father     Past Surgical History:  Procedure Laterality Date   ANTERIOR CERVICAL DECOMP/DISCECTOMY FUSION N/A 05/25/2020   Procedure: ANTERIOR CERVICAL DISCECTOMY AND FUSION CERVICAL FIVE THROUGH CERVICAL SIX WITH PLATES, SCREWS, ALLOGRAFT AND LOCAL BONE GRAFT, VIVIGEN;  Surgeon: Kerrin Champagne, MD;  Location: [facility] OR;  Service: Orthopedics;  Laterality: N/A;   CSF SHUNT     2 revisions   MULTIPLE EXTRACTIONS WITH ALVEOLOPLASTY Bilateral 11/02/2017   Procedure: MULTIPLE EXTRACTION;  Surgeon: Ocie Doyne, DDS;  Location: [facility] OR;  Service: Oral Surgery;  Laterality: Bilateral;   Social History   Occupational History   Not on file  Tobacco Use   Smoking status: Every Day    Packs/day: 0.50    Years: 21.00    Pack years: 10.50    Types: Cigarettes   Smokeless tobacco: Never  Vaping Use   Vaping Use: Never used  Substance and Sexual Activity   Alcohol use: Yes    Comment: occasional   Drug use: Yes    Types: Marijuana    Comment: marijuana -- bag per day. - -4 joints/day   Sexual activity: Yes    Birth control/protection: None    Comment: 1 partners

## 2021-12-08 ENCOUNTER — Telehealth: Payer: Self-pay | Admitting: Specialist

## 2021-12-08 NOTE — Telephone Encounter (Signed)
Pt calling stating she feels like she is taking too many ibuprofen. Pt had an appt yesterday and wanted to know if there was anything else she could do to help manage pain without taking too many medications. The best call back number is [phone].

## 2021-12-09 ENCOUNTER — Telehealth: Payer: Self-pay | Admitting: Specialist

## 2021-12-09 NOTE — Telephone Encounter (Signed)
Received medical records release form/FMLA paperwork and $25.00 cash/ Forwarding ro [employer] today

## 2021-12-13 ENCOUNTER — Inpatient Hospital Stay
Admission: RE | Admit: 2021-12-13 | Discharge: 2021-12-13 | Disposition: A | Discharge status: Home / Self Care | Payer: 59 | Source: Ambulatory Visit | Attending: Specialist | Admitting: Specialist

## 2021-12-13 ENCOUNTER — Other Ambulatory Visit: Payer: 59

## 2021-12-13 NOTE — Discharge Instructions (Signed)

## 2021-12-19 ENCOUNTER — Ambulatory Visit (INDEPENDENT_AMBULATORY_CARE_PROVIDER_SITE_OTHER): Payer: 59 | Admitting: Family Medicine

## 2021-12-19 ENCOUNTER — Other Ambulatory Visit: Payer: Self-pay

## 2021-12-19 VITALS — BP 112/68 | HR 70 | Wt 301.0 lb

## 2021-12-19 DIAGNOSIS — L732 Hidradenitis suppurativa: Secondary | ICD-10-CM | POA: Diagnosis not present

## 2021-12-19 DIAGNOSIS — E66813 Obesity, class 3: Secondary | ICD-10-CM

## 2021-12-19 DIAGNOSIS — Z6841 Body Mass Index (BMI) 40.0 and over, adult: Secondary | ICD-10-CM

## 2021-12-19 DIAGNOSIS — E119 Type 2 diabetes mellitus without complications: Secondary | ICD-10-CM

## 2021-12-19 LAB — POCT GLYCOSYLATED HEMOGLOBIN (HGB A1C): HbA1c, POC (controlled diabetic range): 6.7 % (ref 0.0–7.0)

## 2021-12-19 MED ORDER — CLINDAMYCIN HCL 300 MG PO CAPS: 300.0000 mg | ORAL_CAPSULE | Freq: Two times a day (BID) | ORAL | 0 refills | Status: DC

## 2021-12-19 NOTE — Patient Instructions (Signed)
Thank for coming in today.  It was nice to see you.  We discussed your A1c is 6.7 this is good.  You desire weight loss we discussed following up with Dr. Wynelle Cleveland to discuss this.  I would advise taking medication that is not prescribed to you.  I have also refilled your clindamycin for your hidradenitis.  Follow-up with me as needed.

## 2021-12-19 NOTE — Progress Notes (Signed)
° ° °  SUBJECTIVE:   CHIEF COMPLAINT / HPI:   Ms. Dorval is a 41 yo F who presents for the below.   Diabetes Current Regimen: Diet controlled Last A1c: 6.6 on 05/11/2021  Last Eye Exam: 10/07/2020; referred for eye exam 05/11/2021 will need to get records Statin: None; last lipid panel with LDL of 104 ACE/ARB: n/a  Desires weight loss Has tried several injectables including Montanaro, semaglutide, Victoza.  She states that all of these made her sick.  She now desires an oral medication in the form of a pill for weight loss.  She attempted a friend's medication that was in the shape of a dog bone.  She denies having increased heart rate, agitation, insomnia with taking this medication.  States that she only took 1 to 2 pills.  PERTINENT  PMH / PSH: Hx of HTN (no medications and BP 121/70 on 12/07/2021), Hidradenitis suppurativa (needs refill on clindamycin); Legally blind, tobacco use disorder, anxiety, marijuana use  OBJECTIVE:   Vitals:   12/19/21 1510  BP: 112/68  Pulse: 70  SpO2: 99%   Physical Exam Constitutional:      General: She is not in acute distress.    Appearance: She is not ill-appearing.  Cardiovascular:     Rate and Rhythm: Normal rate and regular rhythm.  Pulmonary:     Effort: Pulmonary effort is normal.     Breath sounds: Normal breath sounds.  Musculoskeletal:     Right lower leg: No edema.     Left lower leg: No edema.  Feet:     Right foot:     Skin integrity: No ulcer, blister, skin breakdown, callus or dry skin.     Left foot:     Skin integrity: No ulcer, blister, skin breakdown, callus or dry skin.  Neurological:     Mental Status: She is alert and oriented to person, place, and time.  Psychiatric:        Mood and Affect: Mood normal.        Behavior: Behavior normal.   ASSESSMENT/PLAN:   1. Diabetes mellitus without complication (HCC) A1c 6.7. Discussed lifestyle and diet for glucose control. Desires weight loss. Hx of GLP-1 use but makes  patient ill. Considering oral medications as discussed below.   2. Class 3 severe obesity due to excess calories with serious comorbidity and body mass index (BMI) of 45.0 to 49.9 in adult [facility]) Desires weight loss. Taking medication not prescribed to her. Dog bone identified as phentermine. Advised against phentermine with hx of marijuana/tobacco use, diabetes, and anxiety. Can revisit this topic at follow up. Follow up scheduled with Dr. Idalia Needle to discuss weight loss options.   3. Hidradenitis suppurativa Chronic. Refilled medication. Also taking rifampin. Discussed this is not a recurrent long term option and it is advised to use for up to 10 weeks.  - clindamycin (CLEOCIN) 300 MG capsule; Take 1 capsule (300 mg total) by mouth 2 (two) times daily.  Dispense: 60 capsule; Refill: 0   Lavonda Jumbo, DO [place] [facility]

## 2021-12-20 ENCOUNTER — Ambulatory Visit
Admission: RE | Admit: 2021-12-20 | Discharge: 2021-12-20 | Disposition: A | Discharge status: Home / Self Care | Payer: 59 | Source: Ambulatory Visit | Attending: Specialist | Admitting: Specialist

## 2021-12-20 DIAGNOSIS — M4802 Spinal stenosis, cervical region: Secondary | ICD-10-CM

## 2021-12-20 DIAGNOSIS — M542 Cervicalgia: Secondary | ICD-10-CM

## 2021-12-20 IMAGING — CT CT CERVICAL SPINE W/ CM
3 series · 8 of 14 positions shown, 9 images · non-contrast
Comparison: Cervical spine MRI [DATE]

CLINICAL DATA: Chronic neck and shoulder pain. Numbness in the
hands. Prior cervical fusion.
TECHNIQUE: Contiguous axial images were obtained through the cervical spine
after the intrathecal infusion of contrast. Coronal and sagittal
reconstructions were obtained of the axial image sets.

[Series 2: cspine soft · axial · 0.26mm/px · z∈[+562,+624]mm · 2 of 93 slices shown]
[im 31/93  soft-tissue]
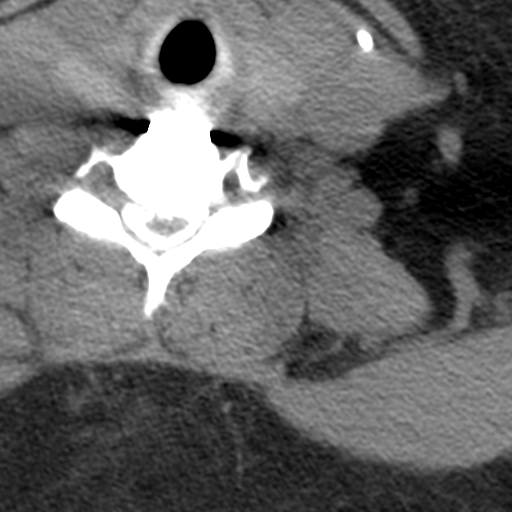
[im 62/93  soft-tissue]
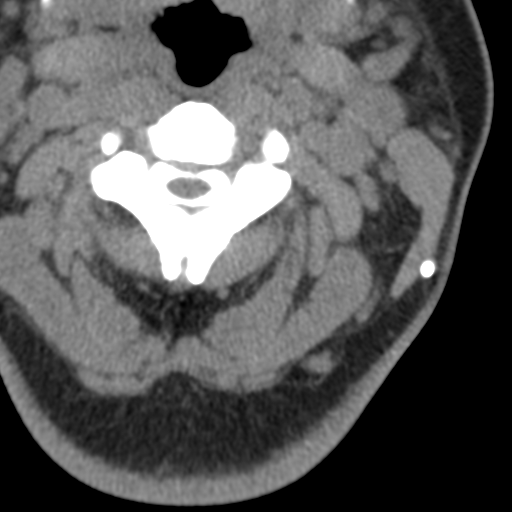

[Series 3: c spine bone · axial · 0.26mm/px · z∈[+550,+640]mm · 3 of 91 slices shown, 4 images]
[im 23/91  soft-tissue]
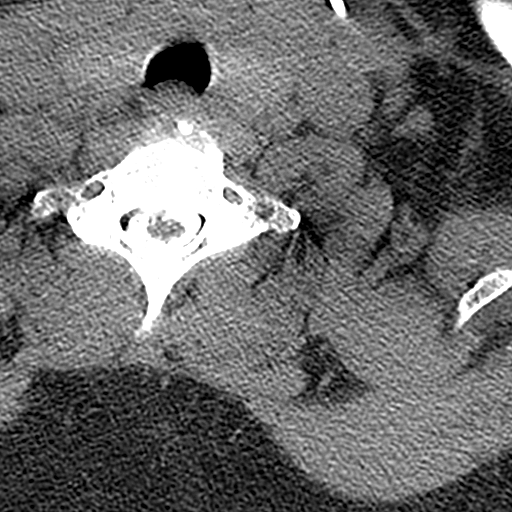
[im 23/91  bone]
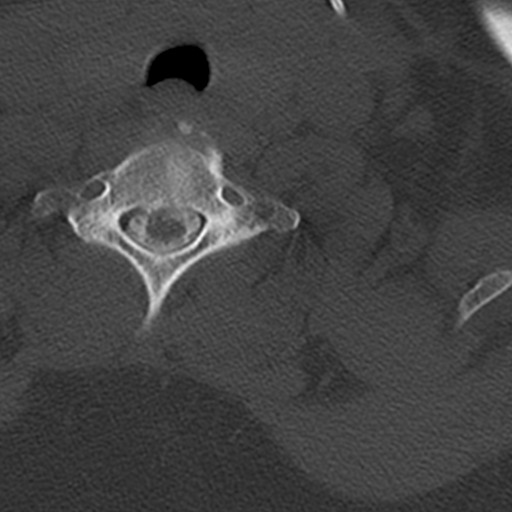
[im 46/91  bone]
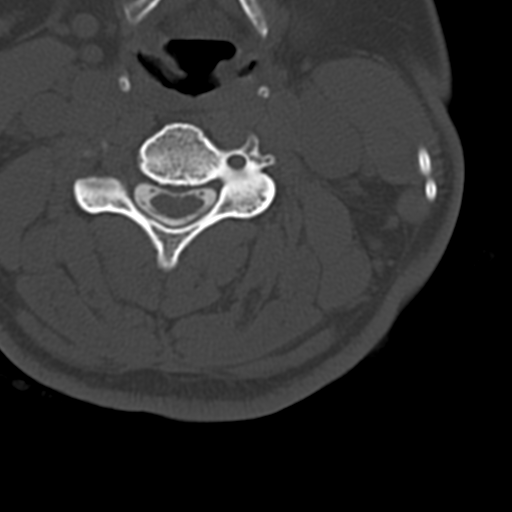
[im 68/91  bone]
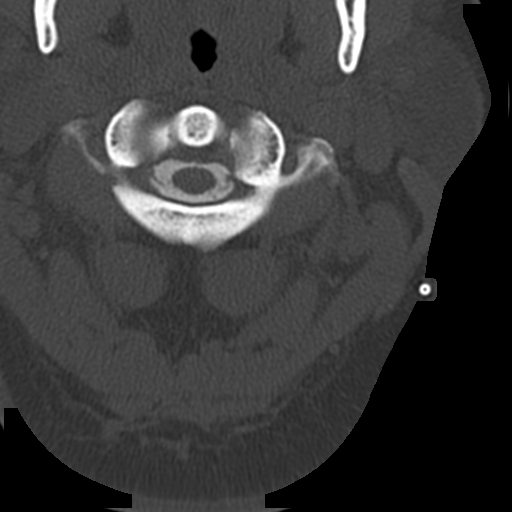

[Series 8: angled axial soft · axial · 0.20mm/px · z∈[+542,+633]mm · 3 of 94 slices shown]
[im 24/94  soft-tissue]
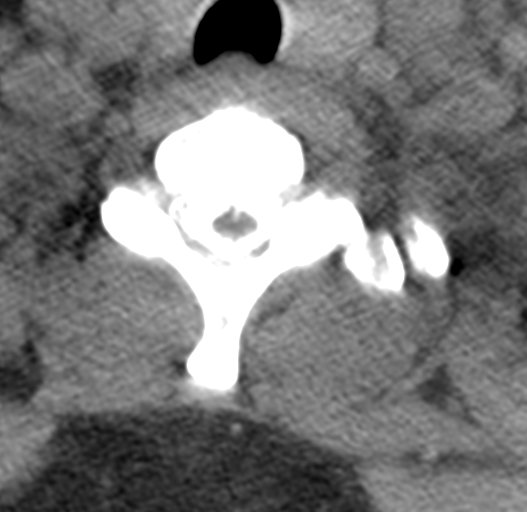
[im 47/94  soft-tissue]
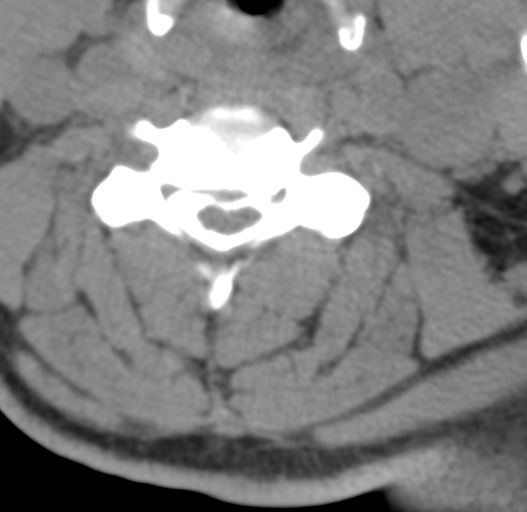
[im 70/94  soft-tissue]
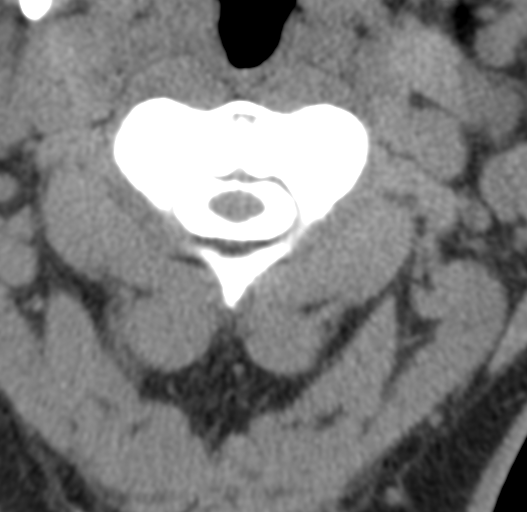

[8 of 14 positions shown; findings below may reference images not displayed]

EXAM:
CERVICAL MYELOGRAM

CT CERVICAL MYELOGRAM

FLUOROSCOPY TIME:  Fluoroscopy Time: 1 minute 10 seconds

Radiation Exposure Index: 412.42 microGray*m^2

PROCEDURE:
LUMBAR PUNCTURE FOR CERVICAL MYELOGRAM

After thorough discussion of risks and benefits of the procedure
including bleeding, infection, injury to nerves, blood vessels,
adjacent structures as well as headache and CSF leak, written and
oral informed consent was obtained. Consent was obtained by Dr.
BLADIMIR RAUL. We discussed the high likelihood of obtaining a
diagnostic study.

Patient was positioned prone on the fluoroscopy table. Local
anesthesia was provided with 1% lidocaine without epinephrine after
prepped and draped in the usual sterile fashion. Puncture was
performed at L3-4 using a 6 inch 20 gauge spinal needle via a right
interlaminar approach. Using a single pass through the dura, the
needle was placed within the thecal sac, with return of clear CSF.
10 mL of Isovue [H6] was injected into the thecal sac, with normal
opacification of the nerve roots and cauda equina consistent with
free flow within the subarachnoid space. The patient was then moved
to the Trendelenburg position and contrast flowed into the cervical
spine region.

I personally performed the lumbar puncture and administered the
intrathecal contrast. I also personally supervised acquisition of
the myelogram images.
FINDINGS: CERVICAL MYELOGRAM FINDINGS:

There is straightening of the normal cervical lordosis without
significant listhesis or evidence of abnormal motion on upright
flexion or extension radiographs. C5-6 ACDF is noted. Superimposed
soft tissues limit detailed assessment for stenosis on the
conventional myelogram images, particularly in the lower cervical
spine. There are small ventral extradural defects at C3-4 and C4-5
resulting in likely mild spinal stenosis.

CT CERVICAL MYELOGRAM FINDINGS:

There is straightening of the normal cervical lordosis without
listhesis. No acute fracture or suspicious osseous lesion is
evident. Sequelae of C5-6 ACDF are again identified with evidence of
solid arthrodesis. The cervical spinal canal is mildly small in
caliber diffusely on a congenital basis. The paraspinal soft tissues
are unremarkable.

C2-3: A small central disc protrusion results in mild spinal
stenosis with subtle ventral cord flattening. Patent neural
foramina.

C3-4: A central disc protrusion results in mild spinal stenosis,
mildly indenting the ventral spinal cord. Patent neural foramina.

C4-5: A small central disc protrusion results in mild spinal
stenosis, mildly indenting the ventral spinal cord. There is mild
left neural foraminal stenosis due to mild disc bulging and
uncovertebral spurring.

C5-6: ACDF. A central disc osteophyte complex and ossification of
the posterior longitudinal ligament result in moderate spinal
stenosis, moderately indenting the ventral spinal cord. Patent
neural foramina.

C6-7: A small right paracentral disc osteophyte complex results in
mild-to-moderate spinal stenosis with mild-to-moderate right-sided
cord flattening. Patent neural foramina.

C7-T1: Mild facet arthrosis without disc herniation or stenosis.

The overall appearance of the cervical spine is similar to the prior
motion degraded MRI except at C6-7 where spinal stenosis has likely
mildly progressed.
IMPRESSION: 1. Solid C5-6 ACDF. Moderate residual spinal stenosis due to a disc
osteophyte complex and OPLL.
2. Mild-to-moderate spinal stenosis at C6-7.
3. Mild spinal stenosis at C2-3, C3-4, and C4-5.

## 2021-12-20 IMAGING — XA DG MYELOGRAPHY LUMBAR INJ CERVICAL
7 of 14 series · 7 of 14 positions shown · non-contrast
Comparison: Cervical spine MRI [DATE]

CLINICAL DATA: Chronic neck and shoulder pain. Numbness in the
hands. Prior cervical fusion.
TECHNIQUE: Contiguous axial images were obtained through the cervical spine
after the intrathecal infusion of contrast. Coronal and sagittal
reconstructions were obtained of the axial image sets.

[Series 1: vasc adipose · 1 of 1 slices shown (1 of 4)]
[im 1/1]
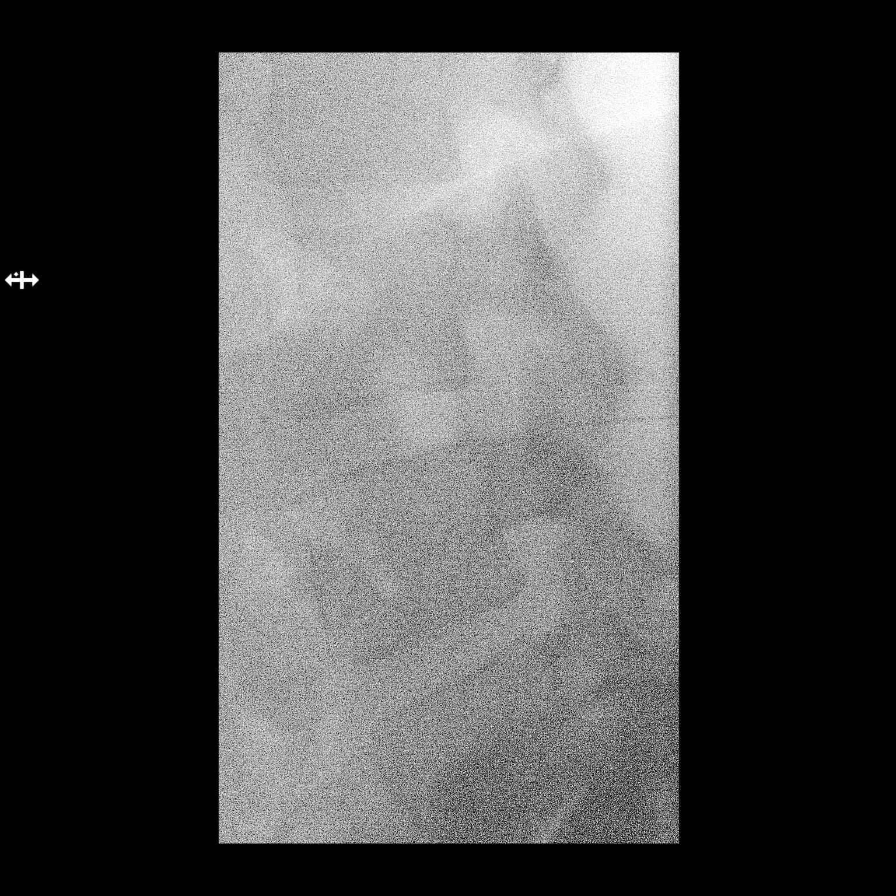

[Series 1: w cervical spine lat · 0.15mm/px · 1 of 1 slices shown]
[im 1/1]
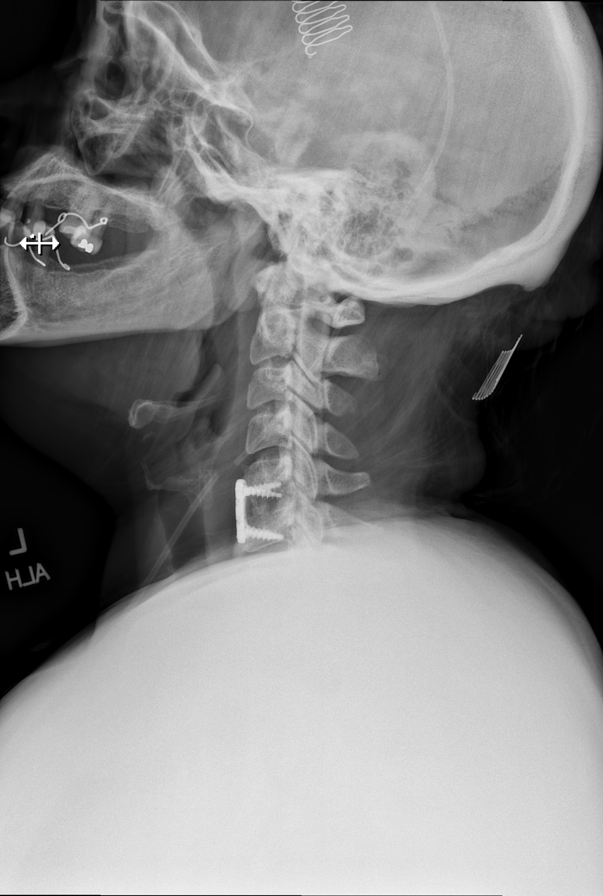

[Series 2: w cervical spine flexion · 0.15mm/px · 1 of 1 slices shown]
[im 1/1]
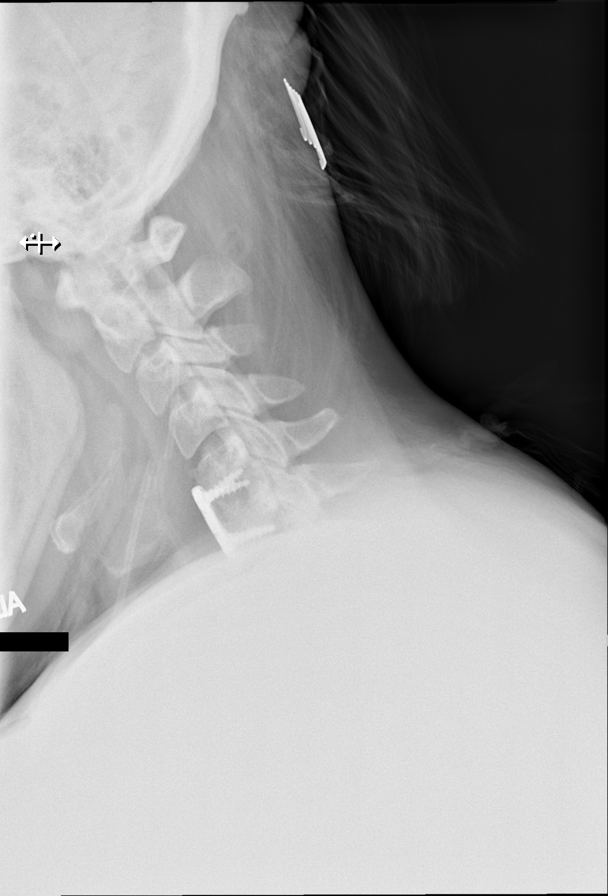

[Series 2: vasc adipose · 1 of 1 slices shown (2 of 4)]
[im 1/1]
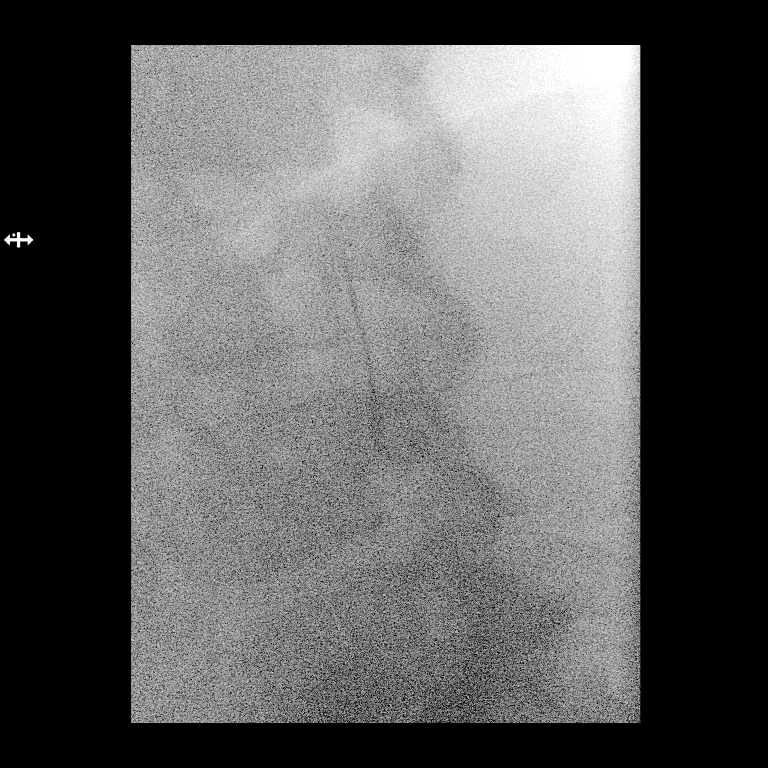

[Series 3: w cervical spine extension · 0.15mm/px · 1 of 1 slices shown]
[im 1/1]
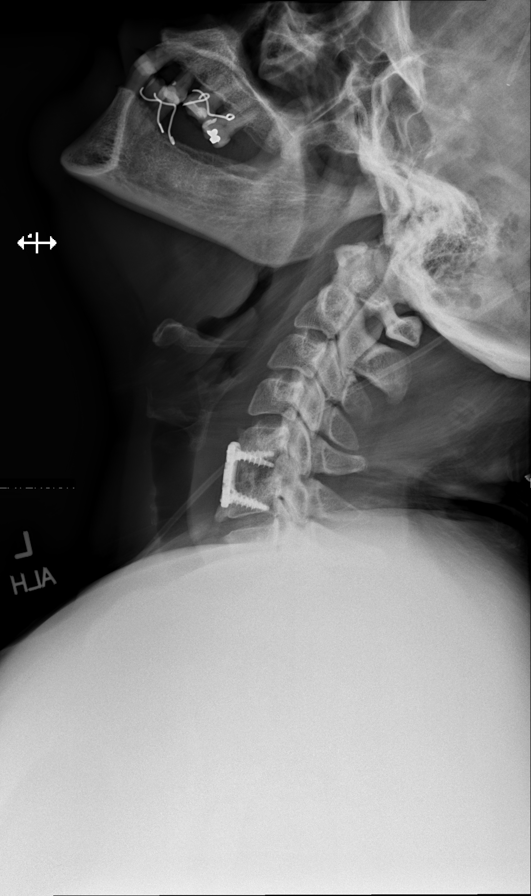

[Series 3: vasc adipose · 1 of 1 slices shown (3 of 4)]
[im 1/1]
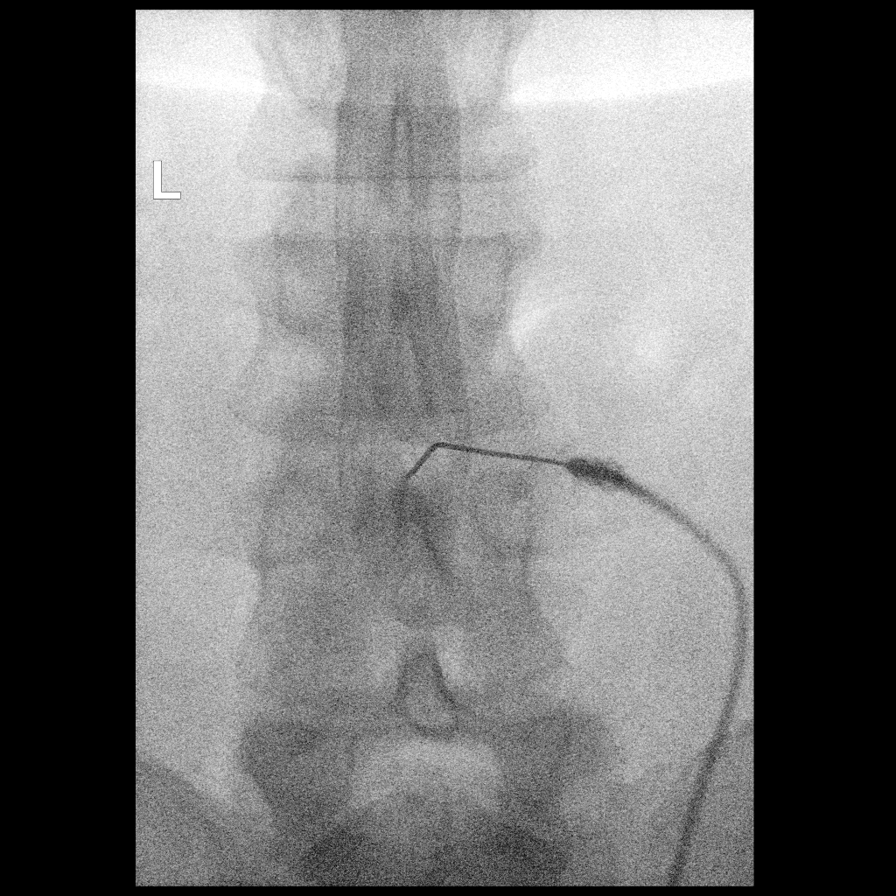

[Series 4: vasc adipose · 1 of 1 slices shown (4 of 4)]
[im 1/1]
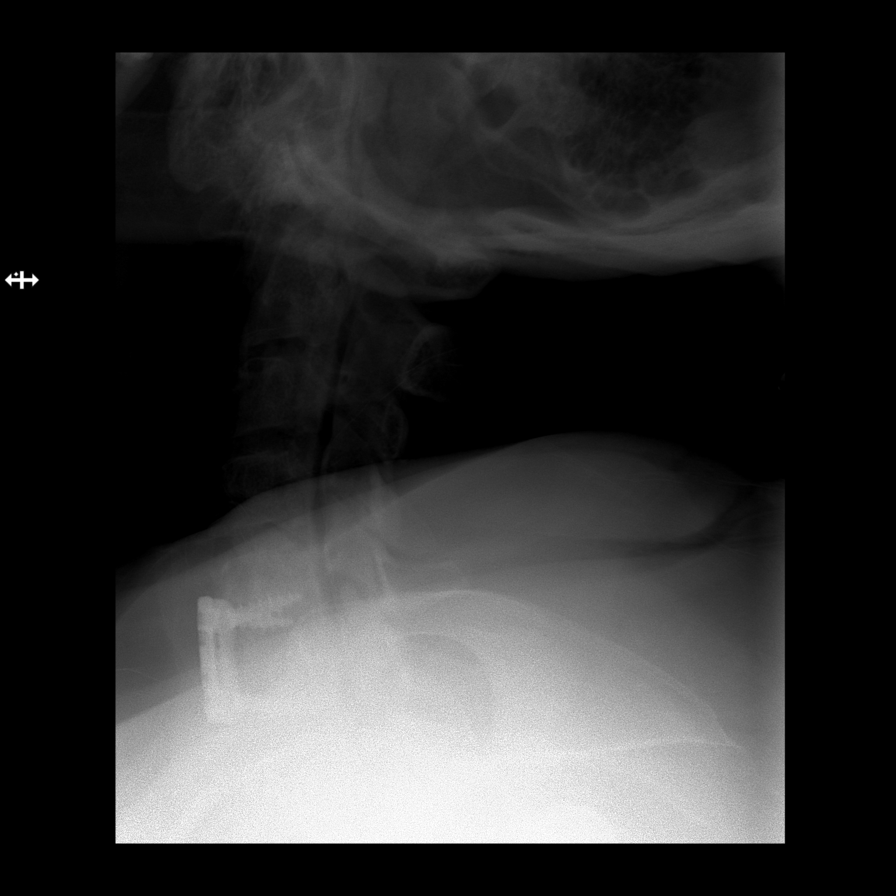

[7 of 14 positions shown; findings below may reference images not displayed]

EXAM:
CERVICAL MYELOGRAM

CT CERVICAL MYELOGRAM

FLUOROSCOPY TIME:  Fluoroscopy Time: 1 minute 10 seconds

Radiation Exposure Index: 412.42 microGray*m^2

PROCEDURE:
LUMBAR PUNCTURE FOR CERVICAL MYELOGRAM

After thorough discussion of risks and benefits of the procedure
including bleeding, infection, injury to nerves, blood vessels,
adjacent structures as well as headache and CSF leak, written and
oral informed consent was obtained. Consent was obtained by Dr.
BLADIMIR RAUL. We discussed the high likelihood of obtaining a
diagnostic study.

Patient was positioned prone on the fluoroscopy table. Local
anesthesia was provided with 1% lidocaine without epinephrine after
prepped and draped in the usual sterile fashion. Puncture was
performed at L3-4 using a 6 inch 20 gauge spinal needle via a right
interlaminar approach. Using a single pass through the dura, the
needle was placed within the thecal sac, with return of clear CSF.
10 mL of Isovue [H6] was injected into the thecal sac, with normal
opacification of the nerve roots and cauda equina consistent with
free flow within the subarachnoid space. The patient was then moved
to the Trendelenburg position and contrast flowed into the cervical
spine region.

I personally performed the lumbar puncture and administered the
intrathecal contrast. I also personally supervised acquisition of
the myelogram images.
FINDINGS: CERVICAL MYELOGRAM FINDINGS:

There is straightening of the normal cervical lordosis without
significant listhesis or evidence of abnormal motion on upright
flexion or extension radiographs. C5-6 ACDF is noted. Superimposed
soft tissues limit detailed assessment for stenosis on the
conventional myelogram images, particularly in the lower cervical
spine. There are small ventral extradural defects at C3-4 and C4-5
resulting in likely mild spinal stenosis.

CT CERVICAL MYELOGRAM FINDINGS:

There is straightening of the normal cervical lordosis without
listhesis. No acute fracture or suspicious osseous lesion is
evident. Sequelae of C5-6 ACDF are again identified with evidence of
solid arthrodesis. The cervical spinal canal is mildly small in
caliber diffusely on a congenital basis. The paraspinal soft tissues
are unremarkable.

C2-3: A small central disc protrusion results in mild spinal
stenosis with subtle ventral cord flattening. Patent neural
foramina.

C3-4: A central disc protrusion results in mild spinal stenosis,
mildly indenting the ventral spinal cord. Patent neural foramina.

C4-5: A small central disc protrusion results in mild spinal
stenosis, mildly indenting the ventral spinal cord. There is mild
left neural foraminal stenosis due to mild disc bulging and
uncovertebral spurring.

C5-6: ACDF. A central disc osteophyte complex and ossification of
the posterior longitudinal ligament result in moderate spinal
stenosis, moderately indenting the ventral spinal cord. Patent
neural foramina.

C6-7: A small right paracentral disc osteophyte complex results in
mild-to-moderate spinal stenosis with mild-to-moderate right-sided
cord flattening. Patent neural foramina.

C7-T1: Mild facet arthrosis without disc herniation or stenosis.

The overall appearance of the cervical spine is similar to the prior
motion degraded MRI except at C6-7 where spinal stenosis has likely
mildly progressed.
IMPRESSION: 1. Solid C5-6 ACDF. Moderate residual spinal stenosis due to a disc
osteophyte complex and OPLL.
2. Mild-to-moderate spinal stenosis at C6-7.
3. Mild spinal stenosis at C2-3, C3-4, and C4-5.

## 2021-12-20 MED ORDER — DIAZEPAM 5 MG PO TABS
10.0000 mg | ORAL_TABLET | Freq: Once | ORAL | Status: AC
  Administered 2021-12-20: 10 mg via ORAL

## 2021-12-20 MED ORDER — MEPERIDINE HCL 50 MG/ML IJ SOLN: 50.0000 mg | Freq: Once | INTRAMUSCULAR | Status: DC | PRN

## 2021-12-20 MED ORDER — IOPAMIDOL (ISOVUE-M 300) INJECTION 61%
10.0000 mL | Freq: Once | INTRAMUSCULAR | Status: AC
  Administered 2021-12-20: 10 mL via INTRATHECAL

## 2021-12-20 MED ORDER — ONDANSETRON HCL 4 MG/2ML IJ SOLN: 4.0000 mg | Freq: Once | INTRAMUSCULAR | Status: DC | PRN

## 2021-12-20 NOTE — Discharge Instructions (Signed)

## 2021-12-21 ENCOUNTER — Other Ambulatory Visit: Payer: Self-pay | Admitting: Family Medicine

## 2021-12-21 DIAGNOSIS — L732 Hidradenitis suppurativa: Secondary | ICD-10-CM

## 2021-12-23 ENCOUNTER — Encounter: Payer: Self-pay | Admitting: Radiology

## 2021-12-23 ENCOUNTER — Telehealth: Payer: Self-pay | Admitting: Specialist

## 2021-12-23 NOTE — Telephone Encounter (Signed)
I called and advised that we did not have anything appts [facility] on 01/04/22, but I could get her moved up to 01/12/22 @ 9, I did up date her FMLA forms to show this.

## 2021-12-23 NOTE — Telephone Encounter (Signed)
Patient needs an appointment 2/8 for MRI Review. Also her work note needs to be extended. Her call back number is [phone]

## 2021-12-26 NOTE — Telephone Encounter (Signed)
Pt called stating she would like a CB in regards to her work note being extended. I did make the pt aware that Neysa Bonito offered her an appt on the 16th and stated she would update the FMLA forms; pt would still like a CB.   [phone]

## 2021-12-26 NOTE — Telephone Encounter (Signed)
I called and advised patient that I sent in her forms but the fax did not go thru, I refaxed them to [phone]

## 2021-12-29 ENCOUNTER — Ambulatory Visit: Payer: 59 | Admitting: Specialist

## 2022-01-01 ENCOUNTER — Other Ambulatory Visit: Payer: Self-pay

## 2022-01-01 ENCOUNTER — Ambulatory Visit
Admission: RE | Admit: 2022-01-01 | Discharge: 2022-01-01 | Disposition: A | Discharge status: Home / Self Care | Payer: 59 | Source: Ambulatory Visit | Attending: Specialist | Admitting: Specialist

## 2022-01-01 DIAGNOSIS — M25512 Pain in left shoulder: Secondary | ICD-10-CM

## 2022-01-01 DIAGNOSIS — M778 Other enthesopathies, not elsewhere classified: Secondary | ICD-10-CM

## 2022-01-02 ENCOUNTER — Telehealth: Payer: Self-pay

## 2022-01-02 NOTE — Telephone Encounter (Signed)
Patient calls nurse line reporting she was denied MRI over the weekend because of her shunt placement. Patient reports "shunt needs to be checked out and cleared for MRI." Patient reports we have done this before for her in the past. I do see some chart notes reflecting this.  Patient has an apt tomorrow with Dr. Idalia Needle. MRI has been rescheduled for 2/10.

## 2022-01-03 ENCOUNTER — Other Ambulatory Visit: Payer: Self-pay | Admitting: Specialist

## 2022-01-03 ENCOUNTER — Telehealth: Payer: Self-pay | Admitting: Specialist

## 2022-01-03 ENCOUNTER — Ambulatory Visit: Payer: 59 | Admitting: Family Medicine

## 2022-01-03 NOTE — Telephone Encounter (Signed)
[employer] forms received. To [employer].

## 2022-01-03 NOTE — Telephone Encounter (Signed)
Received medical records release form and $25.00 cash from patient. Forwarding to [employer] today  °

## 2022-01-06 ENCOUNTER — Ambulatory Visit
Admission: RE | Admit: 2022-01-06 | Discharge: 2022-01-06 | Disposition: A | Discharge status: Home / Self Care | Payer: 59 | Source: Ambulatory Visit | Attending: Specialist | Admitting: Specialist

## 2022-01-06 IMAGING — MR MR SHOULDER*L* W/O CM
6 series · 38 of 40 positions shown · non-contrast
Comparison: Plain films left shoulder [DATE].

CLINICAL DATA: Left shoulder pain, numbness and weakness for 6
months. No known injury.

EXAM:
MRI OF THE LEFT SHOULDER WITHOUT CONTRAST
TECHNIQUE: Multiplanar, multisequence MR imaging of the shoulder was performed.
No intravenous contrast was administered.

[Series 3: T2 fat-sat · axial · 4.0mm · 0.62mm/px · z∈[-101,+28]mm · 7 of 28 slices shown (1 of 4)]
[im 1/28]
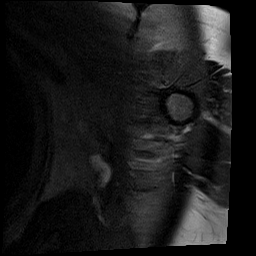
[im 5/28]
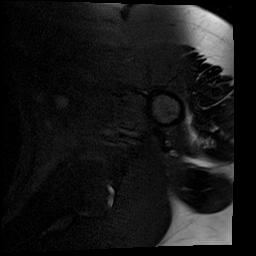
[im 10/28]
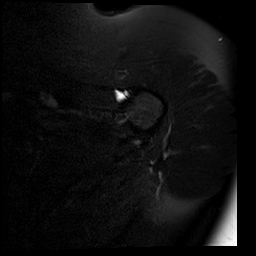
[im 14/28]
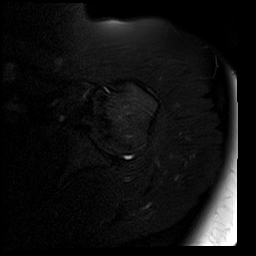
[im 19/28]
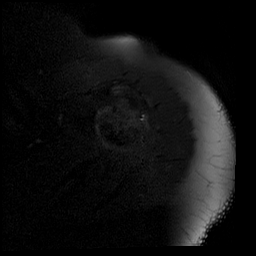
[im 23/28]
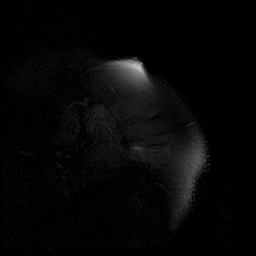
[im 28/28]
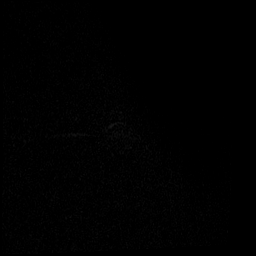

[Series 4: PD · oblique · 4.0mm · 0.29mm/px · 6 of 21 slices shown]
[im 1/21]
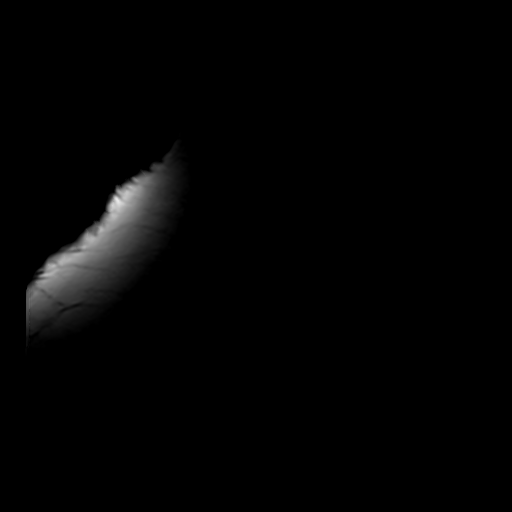
[im 5/21]
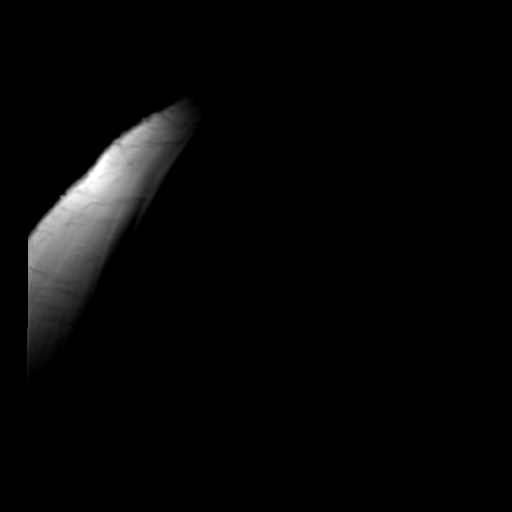
[im 9/21]
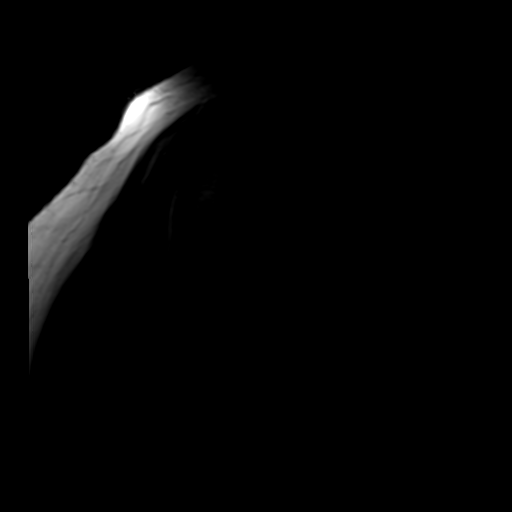
[im 13/21]
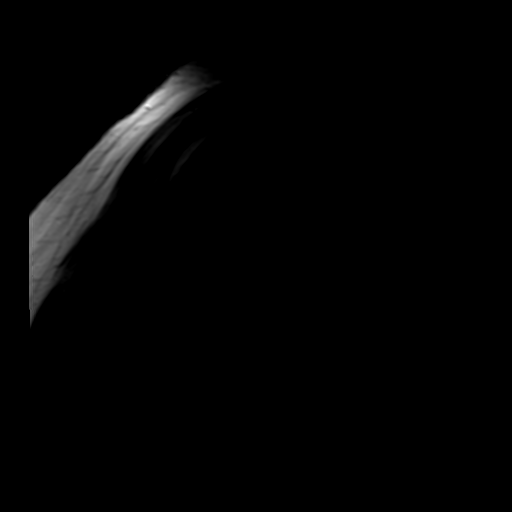
[im 17/21]
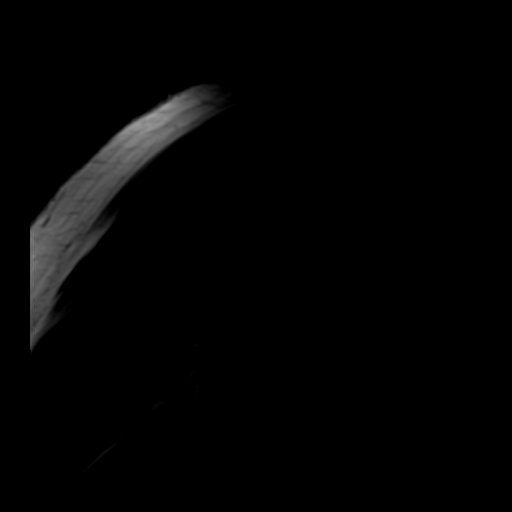
[im 21/21]
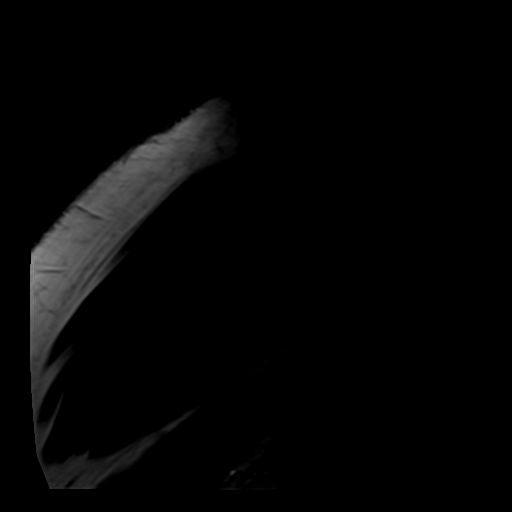

[Series 5: T2 fat-sat · oblique · 4.0mm · 0.59mm/px · 6 of 21 slices shown (2 of 4)]
[im 1/21]
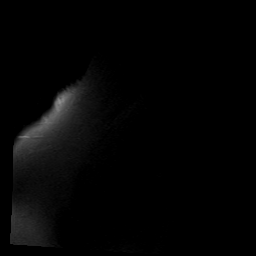
[im 5/21]
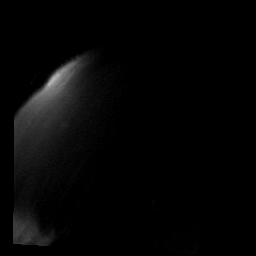
[im 9/21]
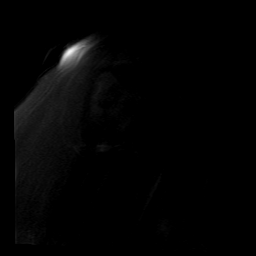
[im 13/21]
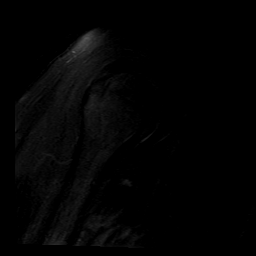
[im 17/21]
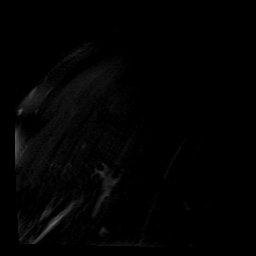
[im 21/21]
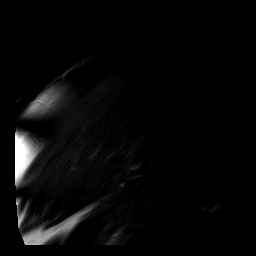

[Series 6: T2 fat-sat · oblique · 4.0mm · 0.59mm/px · 7 of 25 slices shown (3 of 4)]
[im 1/25]
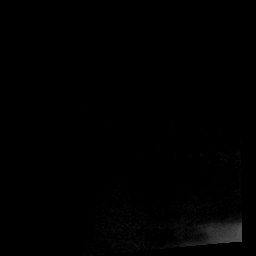
[im 5/25]
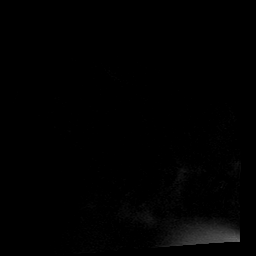
[im 9/25]
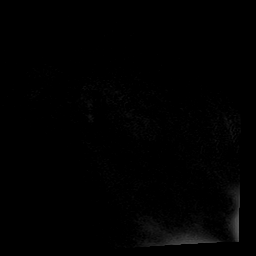
[im 13/25]
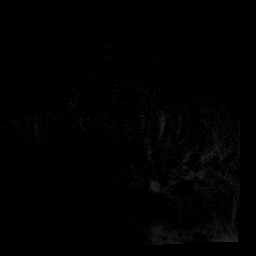
[im 17/25]
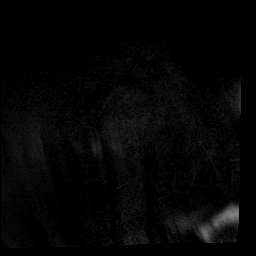
[im 21/25]
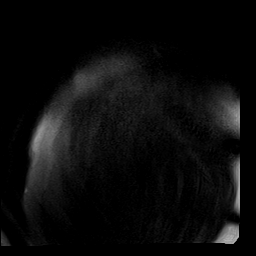
[im 25/25]
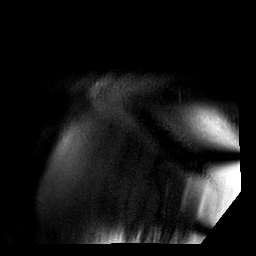

[Series 7: T1 · oblique · 4.0mm · 0.29mm/px · 5 of 25 slices shown]
[im 1/25]
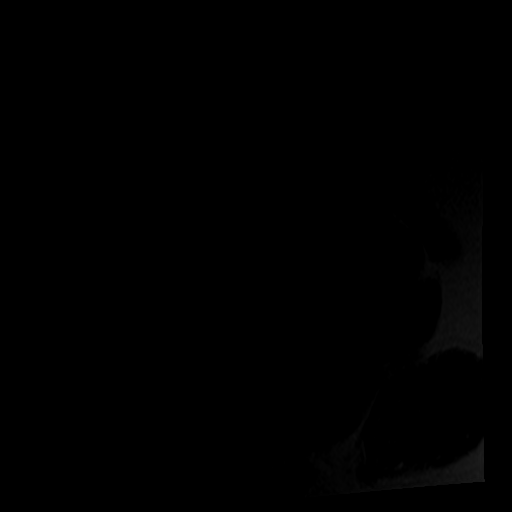
[im 5/25]
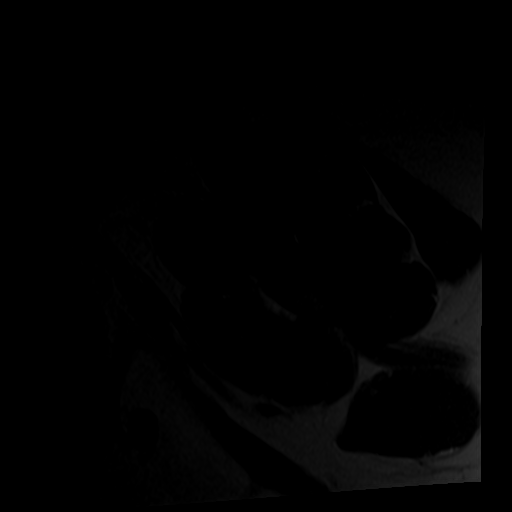
[im 9/25]
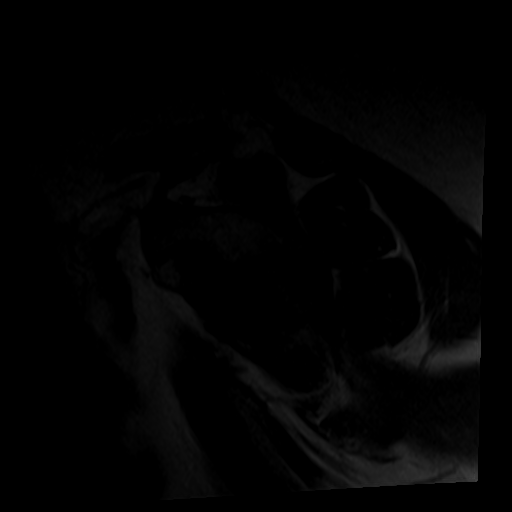
[im 13/25]
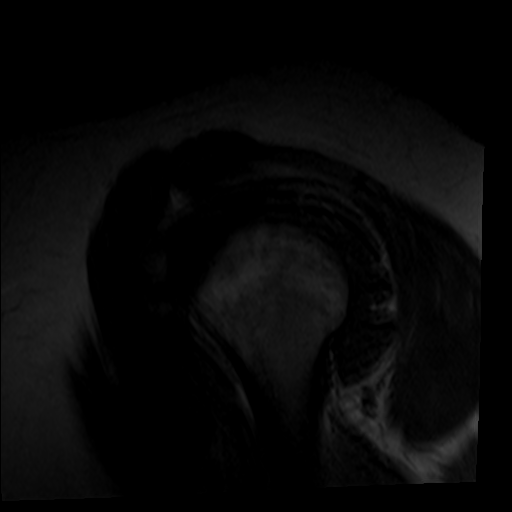
[im 17/25]
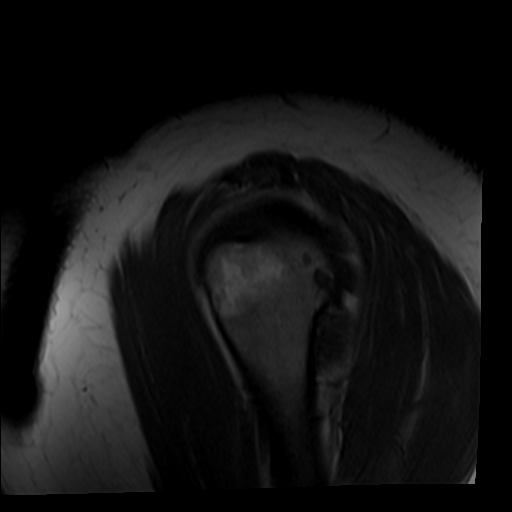

[Series 8: T2 fat-sat · oblique · 4.0mm · 0.59mm/px · 7 of 25 slices shown (4 of 4)]
[im 1/25]
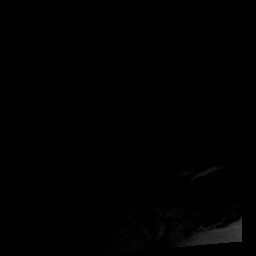
[im 5/25]
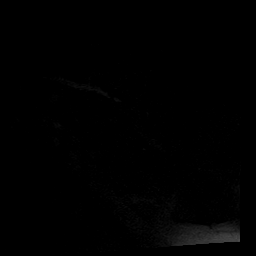
[im 9/25]
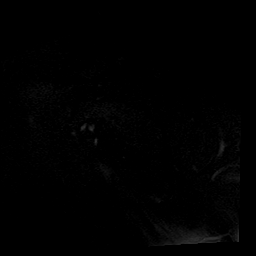
[im 13/25]
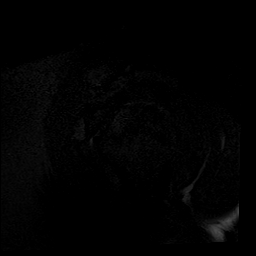
[im 17/25]
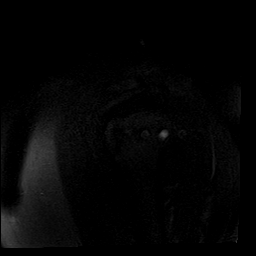
[im 21/25]
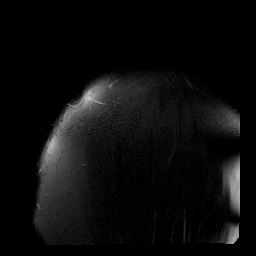
[im 25/25]
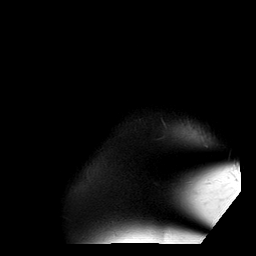

[38 of 40 positions shown; findings below may reference images not displayed]

FINDINGS: Patient motion degrades the exam.

Rotator cuff: Intact. Mild appearing infraspinatus tendinopathy
noted.

Muscles:  Normal without atrophy or focal lesion.

Biceps long head:  Intact.

Acromioclavicular Joint: Mild osteoarthritis. Type 2 acromion. No
subacromial/subdeltoid bursal fluid.

Glenohumeral Joint: Appears normal.

Labrum:  Poorly seen but appears intact.

Bones:  Negative.

Other: None.
IMPRESSION: Mild appearing infraspinatus tendinopathy without tear.

Mild acromioclavicular osteoarthritis.

## 2022-01-09 ENCOUNTER — Other Ambulatory Visit: Payer: 59

## 2022-01-12 ENCOUNTER — Ambulatory Visit (INDEPENDENT_AMBULATORY_CARE_PROVIDER_SITE_OTHER): Payer: 59 | Admitting: Specialist

## 2022-01-12 ENCOUNTER — Encounter: Payer: Self-pay | Admitting: Specialist

## 2022-01-12 ENCOUNTER — Other Ambulatory Visit: Payer: Self-pay

## 2022-01-12 ENCOUNTER — Telehealth: Payer: Self-pay | Admitting: Specialist

## 2022-01-12 VITALS — BP 139/85 | HR 92 | Ht 68.0 in | Wt 301.0 lb

## 2022-01-12 DIAGNOSIS — M4712 Other spondylosis with myelopathy, cervical region: Secondary | ICD-10-CM

## 2022-01-12 DIAGNOSIS — M778 Other enthesopathies, not elsewhere classified: Secondary | ICD-10-CM

## 2022-01-12 DIAGNOSIS — M4802 Spinal stenosis, cervical region: Secondary | ICD-10-CM | POA: Diagnosis not present

## 2022-01-12 DIAGNOSIS — M19011 Primary osteoarthritis, right shoulder: Secondary | ICD-10-CM

## 2022-01-12 DIAGNOSIS — M542 Cervicalgia: Secondary | ICD-10-CM | POA: Diagnosis not present

## 2022-01-12 DIAGNOSIS — M19012 Primary osteoarthritis, left shoulder: Secondary | ICD-10-CM

## 2022-01-12 MED ORDER — IBUPROFEN 800 MG PO TABS: 800.0000 mg | ORAL_TABLET | Freq: Three times a day (TID) | ORAL | 1 refills | Status: DC | PRN

## 2022-01-12 MED ORDER — PREGABALIN 75 MG PO CAPS: 75.0000 mg | ORAL_CAPSULE | Freq: Two times a day (BID) | ORAL | 0 refills | Status: DC

## 2022-01-12 NOTE — Progress Notes (Signed)
Office Visit Note   Patient: Monica Huang           Date of Birth: [birth year]           MRN: [id number] Visit Date: 01/12/2022              Requested by: Lavonda Jumbo, DO [address] [place],  [place] [address] PCP: Lavonda Jumbo, DO   Assessment & Plan: Visit Diagnoses:  1. Cervicalgia   2. Left shoulder tendonitis   3. Spinal stenosis of cervical region   4. Other spondylosis with myelopathy, cervical region   5. Acromioclavicular arthrosis, bilateral     Plan: Avoid overhead lifting and overhead use of the arms. Do not lift greater than 5 lbs. Adjust head rest in vehicle to prevent hyperextension if rear ended. Take extra precautions to avoid falling. Avoid overhead lifting and overhead use of the arms. Pillows to keep from sleeping directly on the shoulders Limited lifting to less than 10 lbs. Ice or heat for relief. NSAIDs are helpful, such as alleve or motrin, be careful not to use in excess as they place burdens on the kidney. Stretching exercise help and strengthening is helpful to build endurance.  Therapy for the shoulders is ordered.  Discontinue gabapentin and start lyrica.  Follow-Up Instructions: No follow-ups on file.   Orders:  No orders of the defined types were placed in this encounter.  Meds ordered this encounter  Medications   pregabalin (LYRICA) 75 MG capsule    Sig: Take 1 capsule (75 mg total) by mouth 2 (two) times daily.    Dispense:  60 capsule    Refill:  0   ibuprofen (ADVIL) 800 MG tablet    Sig: Take 1 tablet (800 mg total) by mouth every 8 (eight) hours as needed for moderate pain.    Dispense:  40 tablet    Refill:  1      Procedures: No procedures performed   Clinical Data: No additional findings.   Subjective: Chief Complaint  Patient presents with   Left Shoulder - Follow-up   MRI Review    41 year old female with history of cervical HNP with myelopathy post ACDF in 2020. She is still having  difficulty with arm and hand numbness and paresthesias. She is legally blind and uses a walking cane.    Review of Systems  Constitutional: Negative.   HENT: Negative.    Eyes: Negative.   Respiratory: Negative.    Cardiovascular: Negative.   Gastrointestinal: Negative.   Endocrine: Negative.   Genitourinary: Negative.   Musculoskeletal: Negative.   Skin: Negative.   Allergic/Immunologic: Negative.   Neurological: Negative.   Hematological: Negative.   Psychiatric/Behavioral: Negative.      Objective: Vital Signs: BP 139/85    Pulse 92    Ht 5\' 8"  (1.727 m)    Wt (!) 301 lb (136.5 kg)    BMI 45.77 kg/m   Physical Exam Constitutional:      Appearance: She is well-developed.  HENT:     Head: Normocephalic and atraumatic.  Eyes:     Pupils: Pupils are equal, round, and reactive to light.  Pulmonary:     Effort: Pulmonary effort is normal.     Breath sounds: Normal breath sounds.  Abdominal:     General: Bowel sounds are normal.     Palpations: Abdomen is soft.  Musculoskeletal:     Cervical back: Normal range of motion and neck supple.     Lumbar  back: Negative right straight leg raise test and negative left straight leg raise test.  Skin:    General: Skin is warm and dry.  Neurological:     Mental Status: She is alert and oriented to person, place, and time.  Psychiatric:        Behavior: Behavior normal.        Thought Content: Thought content normal.        Judgment: Judgment normal.    Back Exam   Tenderness  The patient is experiencing tenderness in the cervical.  Range of Motion  Extension:  abnormal  Flexion:  abnormal  Lateral bend right:  abnormal  Lateral bend left:  abnormal  Rotation right:  abnormal  Rotation left:  abnormal   Muscle Strength  Right Quadriceps:  5/5  Right Hamstrings:  5/5  Left Hamstrings:  5/5   Tests  Straight leg raise right: negative Straight leg raise left: negative  Other  Toe walk: normal Heel walk:  normal     Specialty Comments:  No specialty comments available.  Imaging: No results found.   PMFS History: Patient Active Problem List   Diagnosis Date Noted   Herniation of cervical intervertebral disc with radiculopathy 05/25/2020    Priority: High    Class: Chronic   Spondylosis without myelopathy or radiculopathy, cervical region 05/25/2020    Priority: High    Class: Chronic   Nausea 09/30/2021   Shortness of breath 09/01/2021   Skin lesion of chest wall 06/30/2021   Elevated blood pressure reading 10/26/2020   Diabetes mellitus without complication (HCC)    COVID-19 vaccine administered    Vaginal itching 06/05/2020   S/P cervical spinal fusion 05/26/2020   Status post cervical spinal fusion 05/25/2020   Suspected sleep apnea 03/11/2020   Palpitations 12/23/2019   Chronic pain of both knees 10/23/2019   Neck and shoulder pain 07/04/2019   Close exposure to COVID-19 virus 06/25/2019   Left knee pain 06/10/2019   Viral upper respiratory infection 05/22/2019   Cervical radiculitis 03/10/2019   Prediabetes 02/06/2019   Carpal tunnel syndrome 02/03/2019   Vaginal discharge 07/05/2016   Anxiety and depression 06/10/2016   Other optic atrophy, 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   Essential hypertension, benign 05/10/2015   Dizziness 09/30/2014   Mechanical complication-ventricular(CSF) communicating shunt (HCC) 03/05/2013   Hidradenitis suppurativa 10/15/2012   Right hand pain 02/13/2012   Tobacco abuse 01/03/2012   Legally blind 01/03/2012   Obesity 01/03/2012   Healthcare maintenance 01/03/2012   Marijuana smoker 01/03/2012   Past Medical History:  Diagnosis Date   Ambulates with cane    Chronic back pain    Chronic leg pain    Diabetes mellitus without complication (HCC)    Type II - no meds   Dizziness    Hydradenitis    Hyperlipidemia    diet controlle - no meds   Legally blind    uses cane - some limited vision   Macular degeneration    patient denies this  dx  (dr street dx)   Pneumonia    x 1   Pseudotumor cerebri syndrome 2005   shunt placed- and legally blind   Sciatica    Sleep apnea    "Mild" - has appt on 06/23/20 to see about cpap machine   Wears partial dentures    upper and lower    Family History  Problem Relation Age of Onset   Diabetes Mother    Hypertension Mother    Diabetes Father  Past Surgical History:  Procedure Laterality Date   ANTERIOR CERVICAL DECOMP/DISCECTOMY FUSION N/A 05/25/2020   Procedure: ANTERIOR CERVICAL DISCECTOMY AND FUSION CERVICAL FIVE THROUGH CERVICAL SIX WITH PLATES, SCREWS, ALLOGRAFT AND LOCAL BONE GRAFT, VIVIGEN;  Surgeon: Kerrin Champagne, MD;  Location: [facility] OR;  Service: Orthopedics;  Laterality: N/A;   CSF SHUNT     2 revisions   MULTIPLE EXTRACTIONS WITH ALVEOLOPLASTY Bilateral 11/02/2017   Procedure: MULTIPLE EXTRACTION;  Surgeon: Ocie Doyne, DDS;  Location: [facility] OR;  Service: Oral Surgery;  Laterality: Bilateral;   Social History   Occupational History   Not on file  Tobacco Use   Smoking status: Every Day    Packs/day: 0.50    Years: 21.00    Pack years: 10.50    Types: Cigarettes   Smokeless tobacco: Never  Vaping Use   Vaping Use: Never used  Substance and Sexual Activity   Alcohol use: Yes    Comment: occasional   Drug use: Yes    Types: Marijuana    Comment: marijuana -- bag per day. - -4 joints/day   Sexual activity: Yes    Birth control/protection: None    Comment: 1 partners

## 2022-01-12 NOTE — Patient Instructions (Signed)
°  Plan: Avoid overhead lifting and overhead use of the arms. Do not lift greater than 5 lbs. Adjust head rest in vehicle to prevent hyperextension if rear ended. Take extra precautions to avoid falling. Avoid overhead lifting and overhead use of the arms. Pillows to keep from sleeping directly on the shoulders Limited lifting to less than 10 lbs. Ice or heat for relief. NSAIDs are helpful, such as alleve or motrin, be careful not to use in excess as they place burdens on the kidney. Stretching exercise help and strengthening is helpful to build endurance.  Therapy for the shoulders is ordered.  Discontinue gabapentin and start lyrica.

## 2022-01-12 NOTE — Telephone Encounter (Signed)
Patient called to leave fax# to her employee. The fax# is [phone]     The number to contact patient is [phone]

## 2022-01-13 NOTE — Telephone Encounter (Signed)
Need to know from patient what needing faxed.

## 2022-01-17 ENCOUNTER — Telehealth: Payer: Self-pay | Admitting: Specialist

## 2022-01-17 NOTE — Telephone Encounter (Signed)
Patient called needing a return back to work note faxed to her employer. Patient said they need the AVS faxed as well as the work note with any restrictions. Patient said she will be returning back to work on March 6.  The fax# is  [phone]   The number to contact patient is [phone]

## 2022-01-17 NOTE — Telephone Encounter (Signed)
Patient requested note and AVS be faxed to employer-- this has been done

## 2022-01-17 NOTE — Telephone Encounter (Signed)
Info has been faxed to employer as requested

## 2022-01-18 ENCOUNTER — Ambulatory Visit: Payer: 59 | Admitting: Specialist

## 2022-01-19 ENCOUNTER — Other Ambulatory Visit: Payer: Self-pay | Admitting: Family Medicine

## 2022-01-19 DIAGNOSIS — L732 Hidradenitis suppurativa: Secondary | ICD-10-CM

## 2022-01-25 ENCOUNTER — Ambulatory Visit: Payer: 59 | Attending: Specialist

## 2022-01-25 NOTE — Therapy (Deleted)
?OUTPATIENT PHYSICAL THERAPY SHOULDER EVALUATION ? ? ?Patient Name: Monica Huang ?MRN: [id number] ?DOB:January 29, 1981, 41 y.o., female ?Today's Date: 01/25/2022 ? ? ? ?Past Medical History:  ?Diagnosis Date  ? Ambulates with cane   ? Chronic back pain   ? Chronic leg pain   ? Diabetes mellitus without complication (HCC)   ? Type II - no meds  ? Dizziness   ? Hydradenitis   ? Hyperlipidemia   ? diet controlle - no meds  ? Legally blind   ? uses cane - some limited vision  ? Macular degeneration   ? patient denies this dx  (dr street dx)  ? Pneumonia   ? x 1  ? Pseudotumor cerebri syndrome 2005  ? shunt placed- and legally blind  ? Sciatica   ? Sleep apnea   ? "Mild" - has appt on 06/23/20 to see about cpap machine  ? Wears partial dentures   ? upper and lower  ? ?Past Surgical History:  ?Procedure Laterality Date  ? ANTERIOR CERVICAL DECOMP/DISCECTOMY FUSION N/A 05/25/2020  ? Procedure: ANTERIOR CERVICAL DISCECTOMY AND FUSION CERVICAL FIVE THROUGH CERVICAL SIX WITH PLATES, SCREWS, ALLOGRAFT AND LOCAL BONE GRAFT, VIVIGEN;  Surgeon: Kerrin Champagne, MD;  Location: [facility] OR;  Service: Orthopedics;  Laterality: N/A;  ? CSF SHUNT    ? 2 revisions  ? MULTIPLE EXTRACTIONS WITH ALVEOLOPLASTY Bilateral 11/02/2017  ? Procedure: MULTIPLE EXTRACTION;  Surgeon: Ocie Doyne, DDS;  Location: [facility] OR;  Service: Oral Surgery;  Laterality: Bilateral;  ? ?Patient Active Problem List  ? Diagnosis Date Noted  ? Nausea 09/30/2021  ? Shortness of breath 09/01/2021  ? Skin lesion of chest wall 06/30/2021  ? Elevated blood pressure reading 10/26/2020  ? Diabetes mellitus without complication (HCC)   ? COVID-19 vaccine administered   ? Vaginal itching 06/05/2020  ? S/P cervical spinal fusion 05/26/2020  ? Herniation of cervical intervertebral disc with radiculopathy 05/25/2020  ?  Class: Chronic  ? Spondylosis without myelopathy or radiculopathy, cervical region 05/25/2020  ?  Class: Chronic  ? Status post cervical spinal fusion 05/25/2020  ? Suspected  sleep apnea 03/11/2020  ? Palpitations 12/23/2019  ? Chronic pain of both knees 10/23/2019  ? Neck and shoulder pain 07/04/2019  ? Close exposure to COVID-19 virus 06/25/2019  ? Left knee pain 06/10/2019  ? Viral upper respiratory infection 05/22/2019  ? Cervical radiculitis 03/10/2019  ? Prediabetes 02/06/2019  ? Carpal tunnel syndrome 02/03/2019  ? Vaginal discharge 07/05/2016  ? Anxiety and depression 06/10/2016  ? Other optic atrophy, 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  ? Essential hypertension, benign 05/10/2015  ? Dizziness 09/30/2014  ? Mechanical complication-ventricular(CSF) communicating shunt (HCC) 03/05/2013  ? Hidradenitis suppurativa 10/15/2012  ? Right hand pain 02/13/2012  ? Tobacco abuse 01/03/2012  ? Legally blind 01/03/2012  ? Obesity 01/03/2012  ? Healthcare maintenance 01/03/2012  ? Marijuana smoker 01/03/2012  ? ? ?PCP: Lavonda Jumbo, DO ? ?REFERRING PROVIDER: Kerrin Champagne, MD ? ?REFERRING DIAG: M77.8 (ICD-10-CM) - Left shoulder tendonitis M19.011,M19.012 (ICD-10-CM) - Acromioclavicular arthrosis, bilateral  ? ?THERAPY DIAG: B shoulder pain ? ? ? ?ONSET DATE: *** ? ?SUBJECTIVE:                                                                                                                                                                                     ? ?  SUBJECTIVE STATEMENT: ?*** ? ?PERTINENT HISTORY: ?41 year old female with history of cervical HNP with myelopathy post ACDF in 2020. She is still having difficulty with arm and hand numbness and paresthesias. She is legally blind and uses a walking cane.   ? ?PAIN:  ?Are you having pain? {yes/no:20286} ?NPRS scale: ***/10 ?Pain location: *** ?Pain orientation: {Pain Orientation:25161}  ?PAIN TYPE: {type:313116} ?Pain description: {PAIN DESCRIPTION:21022940}  ?Aggravating factors: *** ?Relieving factors: *** ? ?PRECAUTIONS: Cervical ? ?WEIGHT BEARING RESTRICTIONS No ? ?FALLS:  ?Has patient fallen in last 6 months? {yes/no:20286} Number of falls:  *** ? ?LIVING ENVIRONMENT: ?Lives with: {[facility] lives with:25569::"lives with their family"} ?Lives in: {Lives in:25570} ?Stairs: {yes/no:20286}; {Stairs:24000} ?Has following equipment at home: {Assistive devices:23999} ? ?OCCUPATION: ?disabled ? ?PLOF: Independent with basic ADLs ? ?PATIENT GOALS *** ? ?OBJECTIVE:  ? ?DIAGNOSTIC FINDINGS:  ?MRI left shoulder with AC arthrosis moderate and mild tendonitis of the rotator cuff.  ?Also having right AC arthrosis symptoms. Recommend ROM, Stretching and iontophoresis of the AC joints. Eval and treat. ? ?PATIENT SURVEYS:  ?FOTO *** ? ?COGNITION: ? Overall cognitive status: Within functional limits for tasks assessed ?    ?SENSATION: ? Light touch: Appears intact ?  ? ?POSTURE: ?*** ? ?UPPER EXTREMITY AROM/PROM: ? ?A/PROM Right ?01/25/2022 Left ?01/25/2022  ?Shoulder flexion    ?Shoulder extension    ?Shoulder abduction    ?Shoulder adduction    ?Shoulder internal rotation    ?Shoulder external rotation    ?Elbow flexion    ?Elbow extension    ?Wrist flexion    ?Wrist extension    ?Wrist ulnar deviation    ?Wrist radial deviation    ?Wrist pronation    ?Wrist supination    ?(Blank rows = not tested) ? ?UPPER EXTREMITY MMT: ? ?MMT Right ?01/25/2022 Left ?01/25/2022  ?Shoulder flexion    ?Shoulder extension    ?Shoulder abduction    ?Shoulder adduction    ?Shoulder internal rotation    ?Shoulder external rotation    ?Middle trapezius    ?Lower trapezius    ?Elbow flexion    ?Elbow extension    ?Wrist flexion    ?Wrist extension    ?Wrist ulnar deviation    ?Wrist radial deviation    ?Wrist pronation    ?Wrist supination    ?Grip strength (lbs)    ?(Blank rows = not tested) ? ?SHOULDER SPECIAL TESTS: ? Impingement tests: {shoulder impingement test:25231:a} ? SLAP lesions: {SLAP lesions:25232} ? Instability tests: {shoulder instability test:25233} ? Rotator cuff assessment: {rotator cuff assessment:25234} ? Biceps assessment: {biceps assessment:25235} ? ?JOINT MOBILITY TESTING:   ?*** ? ?PALPATION:  ?*** ?  ?TODAY'S TREATMENT:  ?*** ? ? ?PATIENT EDUCATION: ?Education details: *** ?Person educated: {Person educated:25204} ?Education method: {Education Method:25205} ?Education comprehension: {Education Comprehension:25206} ? ? ?HOME EXERCISE PROGRAM: ?*** ? ?ASSESSMENT: ? ?CLINICAL IMPRESSION: ?Patient is a *** y.o. *** who was seen today for physical therapy evaluation and treatment for ***.  ? ? ?OBJECTIVE IMPAIRMENTS {opptimpairments:25111}.  ? ?ACTIVITY LIMITATIONS {activity limitations:25113}.  ? ?PERSONAL FACTORS {Personal factors:25162} are also affecting patient's functional outcome.  ? ? ?REHAB POTENTIAL: {rehabpotential:25112} ? ?CLINICAL DECISION MAKING: {clinical decision making:25114} ? ?EVALUATION COMPLEXITY: {Evaluation complexity:25115} ? ? ?GOALS: ?Goals reviewed with patient? {yes/no:20286} ? ?SHORT TERM GOALS: ? ?STG Name Target Date Goal status  ?1 *** ?Baseline:  {follow up:25551} {GOALSTATUS:25110}  ?2 *** ?Baseline:  {follow up:25551} {GOALSTATUS:25110}  ?3 *** ?Baseline: {follow up:25551} {GOALSTATUS:25110}  ?4 *** ?Baseline: {follow up:25551} {GOALSTATUS:25110}  ?5 *** ?  Baseline: {follow up:25551} {GOALSTATUS:25110}  ?6 *** ?Baseline: {follow up:25551} {GOALSTATUS:25110}  ?7 *** ?Baseline: {follow up:25551} {GOALSTATUS:25110}  ? ?LONG TERM GOALS:  ? ?LTG Name Target Date Goal status  ?1 *** ?Baseline: {follow up:25551} {GOALSTATUS:25110}  ?2 *** ?Baseline: {follow up:25551} {GOALSTATUS:25110}  ?3 *** ?Baseline: {follow up:25551} {GOALSTATUS:25110}  ?4 *** ?Baseline: {follow up:25551} {GOALSTATUS:25110}  ?5 *** ?Baseline: {follow up:25551} {GOALSTATUS:25110}  ?6 *** ?Baseline: {follow up:25551} {GOALSTATUS:25110}  ?7 *** ?Baseline: {follow up:25551} {GOALSTATUS:25110}  ? ?PLAN: ?PT FREQUENCY: {rehab frequency:25116} ? ?PT DURATION: {rehab duration:25117} ? ?PLANNED INTERVENTIONS: Therapeutic exercises, Therapeutic activity, Neuromuscular re-education, Balance training,  Gait training, Patient/Family education, Joint mobilization, Ionotophoresis 4mg /ml Dexamethasone, and Manual therapy ? ?PLAN FOR NEXT SESSION: *** ? ? ? , PT ?01/25/2022, 3:52 PM  ?

## 2022-01-27 ENCOUNTER — Telehealth: Payer: Self-pay | Admitting: Specialist

## 2022-01-27 ENCOUNTER — Encounter: Payer: Self-pay | Admitting: Radiology

## 2022-01-27 NOTE — Telephone Encounter (Signed)
Lmom that I wrote a note with the previous restrictions stated in the last OV note. I advised that I would place it the front desk downstairs for her to pick up ?

## 2022-01-27 NOTE — Telephone Encounter (Signed)
Pt called stating she need an updated back to work note with her limitations. Please call pt about this matter at [phone]. ?

## 2022-02-20 ENCOUNTER — Ambulatory Visit (INDEPENDENT_AMBULATORY_CARE_PROVIDER_SITE_OTHER): Payer: 59 | Admitting: Family Medicine

## 2022-02-20 ENCOUNTER — Other Ambulatory Visit: Payer: Self-pay

## 2022-02-20 ENCOUNTER — Telehealth: Payer: Self-pay | Admitting: Cardiovascular Disease

## 2022-02-20 ENCOUNTER — Encounter: Payer: Self-pay | Admitting: Family Medicine

## 2022-02-20 DIAGNOSIS — M545 Low back pain, unspecified: Secondary | ICD-10-CM | POA: Diagnosis not present

## 2022-02-20 DIAGNOSIS — M4802 Spinal stenosis, cervical region: Secondary | ICD-10-CM | POA: Insufficient documentation

## 2022-02-20 LAB — POCT URINALYSIS DIP (MANUAL ENTRY)
Bilirubin, UA: NEGATIVE
Glucose, UA: 100 mg/dL — AB
Ketones, POC UA: NEGATIVE mg/dL
Leukocytes, UA: NEGATIVE
Nitrite, UA: NEGATIVE
Protein Ur, POC: NEGATIVE mg/dL
Spec Grav, UA: 1.02 (ref 1.010–1.025)
Urobilinogen, UA: 0.2 E.U./dL
pH, UA: 7 (ref 5.0–8.0)

## 2022-02-20 LAB — POCT UA - MICROSCOPIC ONLY

## 2022-02-20 NOTE — Assessment & Plan Note (Addendum)
The physical exam findings and history are consistent with sacroiliac pain etiology but differential also includes lumbar stenosis and spinal disease. Given the patient's extensive cervical disk disease, it is likely that she has lumbar and/or pelvic arthritis. Conservative management with capsaicin OTC ointment and PT is indicated. Advised patient to request back therapy in upcoming cervical spine PT appointment (patient will contact us if new order is needed). Ordered urinalysis per patient request to rule out renal or urinary causes of pain. Ordered pelvic and spinal Xrays for further evaluation of structural disease. ?

## 2022-02-20 NOTE — Progress Notes (Signed)
.  fm

## 2022-02-20 NOTE — Progress Notes (Signed)
? ? ?  SUBJECTIVE:  ? ?Monica Huang is a 41 y.o. female with a PMH of cervical spinal stenosis, spondylosis, and disk herniation presenting to the clinic for lower back and hip pain. ? ?CHIEF COMPLAINT / HPI: ?Last Wednesday or Thursday the patient woke up with some moderate hip pain. After she got up and started showering the pain got very severe (10/10) and she had to get assistance to get out of the shower. The patient states that the pain is unilateral on the left side and is felt mostly laterally to the sacral/lumbar spine, but wraps around left side from the back to the front. It is felt deeper than the skin, in the muscle. The patient feels some pain radiation in her thigh, especially when walking up steps, but it does not travel down to her knees or ankles. Moving exacerbates the pain. ? ?The patient has a history of spinal surgery for her cervical spine in 2021 and an extensive history of cervical spine disease including spinal stenosis diagnosed in January of this year. ? ?The patient has continued treatment of symptoms with ibuprofen (800 mg 2-3 times a day) and Lyrica (75 mg 2 times a day). These medications have not brought much relief. ? ?PERTINENT  PMH / PSH: ?-Cervical spinal fusion June 2021 ?-Cervical spinal stenosis January 2023 ?-Cervical disk herniation with radiculopathy June 2021  ? ?OBJECTIVE:  ? ?BP 133/85   Pulse 83   Wt (!) 315 lb 9.6 oz (143.2 kg)   LMP 02/06/2022 (Approximate)   SpO2 100%   BMI 47.99 kg/m?   ?General: age-appropriate, resting comfortably in chair, NAD, WNWD, alert and at baseline ?HEENT: NCAT. PERRLA. Sclera without injection or icterus. MMM. ?Neck: Supple. No LAD, thyroid smooth and not palpable. ?Cardiovascular: Regular rate and rhythm. Normal S1/S2. No murmurs, rubs, or gallops appreciated. 2+ radial pulses. ?Pulmonary: Clear bilaterally to ascultation. No increased WOB, no accessory muscle usage. No wheezes, rales, or crackles. ?MSK: Negative straight leg test  bilaterally, TTP over iliac crest and laterally to sacral spine. No TTP over trochanteric bursa. Pain with left external hip rotation. Normal hip ROM. ?Extremities: No edema, warm to touch ?Psych: Pleasant and appropriate  ? ?ASSESSMENT/PLAN:  ? ?Monica Huang is a 41 y.o. female with a PMH of cervical spinal disk herniation, stenosis, and radiculitis leading to disk fusion presenting to the clinic for lower back/hip pain and with pain with left external hip rotation on physical exam. ? ?Low back pain potentially associated with radiculopathy ?The physical exam findings and history are consistent with sacroiliac pain etiology but differential also includes lumbar stenosis and spinal disease. Given the patient's extensive cervical disk disease, it is likely that she has lumbar and/or pelvic arthritis. Conservative management with capsaicin OTC ointment and PT is indicated. Advised patient to request back therapy in upcoming cervical spine PT appointment (patient will contact us if new order is needed). Ordered urinalysis per patient request to rule out renal or urinary causes of pain. Ordered pelvic and spinal Xrays for further evaluation of structural disease. ? ?[facility] doctor, Medical Student ?[facility]   ?

## 2022-02-20 NOTE — Telephone Encounter (Signed)
New Message: ? ? ? ?Patient says she needs new equipment for her C Pap. She wants to know who does she need to contact? ?

## 2022-02-20 NOTE — Patient Instructions (Addendum)
Try capsaicin roll on topically  ?I will call with the lab test results. ?Tell the physical therapy people to also treat your back.  Let me know if they need any orders from me.  Good luck.   ?

## 2022-02-21 ENCOUNTER — Encounter: Payer: Self-pay | Admitting: Family Medicine

## 2022-02-21 NOTE — Progress Notes (Signed)
Monica Huang has a history of significant C spine disease and less well documented lumbar spine disease.  Had the abrupt onset of left sided low back and hip pain radiating to groin.  Pain is clearly worse with movement.  No dysuria, urgency or frequency.  No fever, chills or systemic symptoms.   ? ?Exam strongly suggests musculoskeletal origin.  She asked about a urine test since she has some anterior pressure.  UA is reassuring for no infection.  Will proceed with LS spine and left hip xrays.  Treatment is tough in that she has already been doing many of the things for musculoskeletal pain.  Focus on PT and on trying a new topical, capsaicin.  I will call with Xray results.  Work note given. ?

## 2022-02-28 ENCOUNTER — Ambulatory Visit: Payer: 59 | Admitting: Cardiovascular Disease

## 2022-03-10 ENCOUNTER — Telehealth: Payer: Self-pay | Admitting: Cardiovascular Disease

## 2022-03-10 NOTE — Telephone Encounter (Signed)
agree

## 2022-03-10 NOTE — Telephone Encounter (Signed)
Do not recommend she take Adipex.  It is a stimulant and she already has a history of palpitations, HTN, and AV block.  Patient has diabetes and a medication such as Ozempic or Wegovy would be more beneficial for her for weight loss and cardio protection ?

## 2022-03-10 NOTE — Telephone Encounter (Signed)
Left message to call back  

## 2022-03-10 NOTE — Telephone Encounter (Signed)
? ?  Pt c/o medication issue: ? ?1. Name of Medication: weight loss medication ? ?2. How are you currently taking this medication (dosage and times per day)?  ? ?3. Are you having a reaction (difficulty breathing--STAT)?  ? ?4. What is your medication issue? Pt is requesting to speak with Dr. Landry Dyke nurse. She said, she is getting a new prescription for weight loss medication and wanted to discuss with a nurse if this is something she can take. I asked for the name of the meds she said she cant read it right now but will provide to the nurse when they call her back ?

## 2022-03-10 NOTE — Telephone Encounter (Signed)
-  Pt called to report she was recently prescribed adipex for weight loss and wanted to make sure she can take from a cardiac perspective.  ? ?Will forward to MD and Pharm D for recommendations  ?

## 2022-03-13 NOTE — Telephone Encounter (Signed)
Patient is returning call.  °

## 2022-03-13 NOTE — Telephone Encounter (Signed)
Spoke to patient . Information given. Patient states she has tried Ozempic in the past  but had to stop due pancreas issues. ?Patient did have referral for healthy wellness program In Nov 2022.She is still on the weight list. Phone number  given to patient to conact see on the progress of waitlist . ?She verbalized understanding. ?

## 2022-03-24 ENCOUNTER — Other Ambulatory Visit: Payer: Self-pay | Admitting: Specialist

## 2022-03-30 ENCOUNTER — Ambulatory Visit (INDEPENDENT_AMBULATORY_CARE_PROVIDER_SITE_OTHER): Payer: 59 | Admitting: Cardiovascular Disease

## 2022-03-30 ENCOUNTER — Encounter: Payer: Self-pay | Admitting: Cardiovascular Disease

## 2022-03-30 VITALS — BP 120/80 | HR 84 | Ht 68.0 in | Wt 307.0 lb

## 2022-03-30 DIAGNOSIS — L732 Hidradenitis suppurativa: Secondary | ICD-10-CM | POA: Diagnosis not present

## 2022-03-30 DIAGNOSIS — R42 Dizziness and giddiness: Secondary | ICD-10-CM

## 2022-03-30 DIAGNOSIS — G4733 Obstructive sleep apnea (adult) (pediatric): Secondary | ICD-10-CM | POA: Diagnosis not present

## 2022-03-30 NOTE — Patient Instructions (Signed)
Medication Instructions:  The current medical regimen is effective;  continue present plan and medications.  *If you need a refill on your cardiac medications before your next appointment, please call your pharmacy*  Follow-Up: At [facility], you and your health needs are our priority.  As part of our continuing mission to provide you with exceptional heart care, we have created designated Provider Care Teams.  These Care Teams include your primary Cardiologist (physician) and Advanced Practice Providers (APPs -  Physician Assistants and Nurse Practitioners) who all work together to provide you with the care you need, when you need it.  We recommend signing up for the patient portal called "MyChart".  Sign up information is provided on this After Visit Summary.  MyChart is used to connect with patients for Virtual Visits (Telemedicine).  Patients are able to view lab/test results, encounter notes, upcoming appointments, etc.  Non-urgent messages can be sent to your provider as well.   To learn more about what you can do with MyChart, go to [email or web address].    Your next appointment:   2 year(s)  The format for your next appointment:   In Person  Provider:   Thomas Kelly, MD      

## 2022-03-30 NOTE — Progress Notes (Signed)
? ?Cardiology Office Note   ? ?Date:  03/30/2022  ? ?ID:  Monica Huang, DOB 06-Feb-1981, MRN [id number] ? ?PCP:  [name], DO  ?Cardiologist:  [name], MD  ? ?54 -month follow-up evaluation ? ?History of Present Illness:  ?Monica Huang is a 41 y.o. female who has a history of blindness, diabetes mellitus, and hidradenitis who was referred through the courtesy of Dr. Annamaria Boots for further evaluation of dizziness.  I initially saw her in October 2018.  I last saw her in November 2021.  She presents for 73-month follow-up evaluation. ?  ?Monica Huang is legally blind secondary to pseudotumor cerebri and is status post VP shunting.  In 2017 she was seen by Dr. Gwenlyn Found for evaluation of presyncope.  She had a history of hypertension on diuretics as well as diabetes.  She was smoking 1 pack of cigarettes per day.  She denied chest pain or dyspnea.  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  She often felt dizzy when she would get up.  He did not feel her cardiac exam was significant and at that point did not recommend further cardiac testing. ?  ?The patient states that she has had recurrent episodes of dizziness, particularly if she sits for long periods of time.  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  She denies any recent episodes of syncope.  She denies any episodes of chest pressure.  She has noticed lightheadedness.  At times she notes her heart rate increases.  She denies any awareness of significant arrhythmia.  She denies chest pressure.  She continues to smoke one half pack per day.  She was [facility] and because of her continued recurrent symptomatology and an ECG which suggested irregular bradycardia with potential dropped beats, most likely due to PACs.   ?  ?She underwent an echo Doppler study which showed an EF of 60 to 65%, mild MR and mild TR.  A 48-hour Holter monitor showed an average rate of 88 bpm and  predominant sinus rhythm.  There were episodes of sinus bradycardia with minimum heart rate of 41, and sinus tachycardia up to 144 bpm.  There was also an episode of winky block second-degree type I block.  There were 3 episodes of second-degree heart block with ventricular pauses greater than 2 seconds. ?  ?She was seen in the emergency room in January 2021 with palpitations.  She left AMA.  She was seen by Loma Sousa in March 2021 he continued to have episodes of presyncope about once every other week. She underwent a Zio patch monitor in April 2021 which showed an average heart rate at 99 bpm with sinus rhythm with minimum sinus bradycardia 46 bpm with a maximum sinus tachycardia at 144 bpm.  Her slowest heart rate occurred at 25 bpm at 6:34 AM.  She had several episodes of second-degree Mobitz type I block, and one episode where her rate ranged from 37 to 60 bpm with another episode with rates ranging from 46 to 78 bpm.  There were isolated PACs.  There were no episodes of atrial fibrillation.  Due to concerns for obstructive apnea she underwent a sleep study on March 22, 2020 which showed mild overall sleep apnea with AHI of 5.1 and RDI of 6.9/h.  However sleep apnea was moderate during REM sleep (AHI19/h).  Oxygen desaturation to 89%. ?  ?I saw her for follow-up evaluation on  June 23, 2020 in a telemedicine encounter. At that time, her dizziness was better.  She typically goes to bed between 930 and 10 PM and wakes up around 5 AM when she works in 7 AM without work.  She had undergone herniated disc surgery of her neck by Dr. Barbarann Ehlers, in June 2021.  Presently she denies chest pain or shortness of breath.  She is not sleeping well.  She had not yet had a CPAP titration. ? ?I last saw her on October 12, 2020.  Since her prior evaluation she was started on AutoPap therapy.  She was told by the DME company that all she needed to do was to use her CPAP for 4 hours. Her set up date was around August 29, 2020. I obtained  a download today from October 16 through October 10, 2020. Presently she is not meeting compliance with only 63% of usage days. Average usage is only 3 hours and 33 minutes. Her AutoPap was set at a range of 6 to 20 cm of water. When used AHI is excellent at 2.6. When she has been using the CPAP she does notice improvement in how she feels. An Epworth Sleepiness Scale score was calculated in the office today and this endorsed that seven arguing against significant residual daytime sleepiness.  During her evaluation I had a lengthy discussion with her regarding the effects of sleep apnea on normal sleep architecture and potential adverse cardiovascular consequences if left untreated.  We discussed optimal sleep duration of at least 7 to 8 hours with use of CPAP through the nights entirety.  I discussed the preponderance of REM sleep occurring in the second half of the night and the importance that she use CPAP for the sleep duration particularly since her events were more severe during REM sleep.  I changed her CPAP settings to a auto range of 9 to 18 cm of water. ? ?Since I last saw her, she has been seen by several providers.  She denies any recent chest pain.  She is unaware of any significant tachypalpitations or bradycardia arrhythmic events.  She continues to work at [employer] of the blind.  She typically goes to bed between 9 and 10 PM and wakes up at 5 AM.  I obtained a download from April 4 through Mar 29, 2022.  She is meeting compliance standards.  Average use is significantly improved but still suboptimal at only 6 hours and 25 minutes per night.  At her current pressure settings, AHI is excellent at 1.0.  Her 95th percentile pressure however was elevated at 16.9 with maximum average pressure at 17.6.  An Epworth Sleepiness Scale score was calculated in the office today and this endorsed at 6 arguing against residual daytime sleepiness.  She is no longer on gabapentin or pregabalin.  She continues to  be on rifampin and clindamycin for her hydradenitis.  She presents for evaluation. ? ?Past Medical History:  ?Diagnosis Date  ? Ambulates with cane   ? Chronic back pain   ? Chronic leg pain   ? Diabetes mellitus without complication ([place])   ? Type II - no meds  ? Dizziness   ? Hydradenitis   ? Hyperlipidemia   ? diet controlle - no meds  ? Legally blind   ? uses cane - some limited vision  ? Macular degeneration   ? patient denies this dx  (dr street dx)  ? Pneumonia   ? x 1  ? Pseudotumor cerebri syndrome 2005  ?  shunt placed- and legally blind  ? Sciatica   ? Sleep apnea   ? "Mild" - has appt on 06/23/20 to see about cpap machine  ? Wears partial dentures   ? upper and lower  ? ? ?Past Surgical History:  ?Procedure Laterality Date  ? ANTERIOR CERVICAL DECOMP/DISCECTOMY FUSION N/A 05/25/2020  ? Procedure: ANTERIOR CERVICAL DISCECTOMY AND FUSION CERVICAL FIVE THROUGH CERVICAL SIX WITH PLATES, SCREWS, ALLOGRAFT AND LOCAL BONE GRAFT, VIVIGEN;  Surgeon: Jessy Oto, MD;  Location: [facility];  Service: Orthopedics;  Laterality: N/A;  ? CSF SHUNT    ? 2 revisions  ? MULTIPLE EXTRACTIONS WITH ALVEOLOPLASTY Bilateral 11/02/2017  ? Procedure: MULTIPLE EXTRACTION;  Surgeon: Diona Browner, DDS;  Location: [facility];  Service: Oral Surgery;  Laterality: Bilateral;  ? ? ?Current Medications: ?Outpatient Medications Prior to Visit  ?Medication Sig Dispense Refill  ? albuterol (VENTOLIN HFA) 108 (90 Base) MCG/ACT inhaler Inhale 2 puffs into the lungs every 6 (six) hours as needed for wheezing or shortness of breath. 8 g 2  ? clindamycin (CLEOCIN) 300 MG capsule Take 1 capsule (300 mg total) by mouth 2 (two) times daily. 60 capsule 0  ? clindamycin (CLINDAGEL) 1 % gel APPLY TOPICALLY TWICE A DAY 30 g 0  ? ibuprofen (ADVIL) 800 MG tablet TAKE 1 TABLET BY MOUTH EVERY 8 HOURS AS NEEDED FOR MODERATE PAIN. 40 tablet 1  ? rifampin (RIFADIN) 300 MG capsule TAKE 1 CAPSULE BY MOUTH TWICE A DAY 60 capsule 0  ? gabapentin (NEURONTIN) 100 MG capsule  Take 1 capsule (100 mg total) by mouth at bedtime. 90 capsule 3  ? pregabalin (LYRICA) 75 MG capsule Take 1 capsule (75 mg total) by mouth 2 (two) times daily. 60 capsule 0  ? ?No facility-administered

## 2022-04-04 ENCOUNTER — Ambulatory Visit: Payer: 59 | Admitting: Cardiovascular Disease

## 2022-04-05 NOTE — Patient Instructions (Addendum)
It was wonderful to see you today. ? ?Please bring ALL of your medications with you to every visit.  ? ?Today we talked about: ? ?Plantar fasciitis-wear supportive shoes, massage with tennis ball, dorsiflexion stretch, can apply ice, can use ibuprofen or Aleve as needed for pain if desired.  Follow-up in 6 weeks if no improvement. ? ?Please be sure to schedule follow up at the front  desk before you leave today.  ? ?If you haven't already, sign up for My Chart to have easy access to your labs results, and communication with your primary care physician. ? ?Please call the clinic at [phone] if your symptoms worsen or you have any concerns. It was our pleasure to serve you. ? ?Dr. Salvadore Dom ? ?

## 2022-04-05 NOTE — Progress Notes (Signed)
? ? ?  SUBJECTIVE:  ? ?CHIEF COMPLAINT / HPI:  ? ?Bilateral foot pain ?X1 month. Worsen when taking first steps in the morning but will subside during the day. Recent department change at work 2 months prior. Standing on her feet more. Most sore on the bottom of her foot and along the arch. Also feels soreness in her toes. Denies trauma and states she usually wears nikes and vans to work.  ? ?Diabetes ?Current Regimen: Diet controlled ?Last A1c: 6.7 on 11/2021  ?Last Eye Exam: Needs to schedule ?Statin: None ?ACE/ARB: n/a ? ?PERTINENT  PMH / PSH: HTN ? ?OBJECTIVE:  ? ?BP (!) 137/93   Pulse 91   Temp 98.2 ?F (36.8 ?C)   Ht 5\' 8"  (1.727 m)   Wt (!) 302 lb 12.8 oz (137.3 kg)   SpO2 100%   BMI 46.04 kg/m?   ?General: Appears well, no acute distress. Age appropriate. ?Cardiac: RRR, normal heart sounds, no murmurs ?Respiratory: CTAB, normal effort ?Abdomen: soft, nontender, nondistended ?Extremities: Tenderness along mid foot plantar surface and arch bilaterally. No bone spurs palpated. Negative compression test.  ?Skin: Warm and dry, no rashes noted ?Neuro: alert and oriented ?Psych: normal affect ? ? ?ASSESSMENT/PLAN:  ? ?1. Plantar fasciitis, bilateral ?Discussed wearing supportive shoes, massage with tennis ball, dorsiflexion stretch, ice, can use ibuprofen or Aleve as needed for pain if desired.  Follow-up in 6 weeks if no improvement. ? ?2. Diabetes mellitus without complication (HCC) ?Well controlled. Continue current dietary modifications for diabetes management. ?- HgB A1c ? ? ?Nyashia Raney Autry-Lott, DO ?[facility]  ?

## 2022-04-06 ENCOUNTER — Ambulatory Visit (INDEPENDENT_AMBULATORY_CARE_PROVIDER_SITE_OTHER): Payer: 59 | Admitting: Family Medicine

## 2022-04-06 VITALS — BP 137/93 | HR 91 | Temp 98.2°F | Ht 68.0 in | Wt 302.8 lb

## 2022-04-06 DIAGNOSIS — E119 Type 2 diabetes mellitus without complications: Secondary | ICD-10-CM

## 2022-04-06 DIAGNOSIS — M722 Plantar fascial fibromatosis: Secondary | ICD-10-CM

## 2022-04-06 LAB — POCT GLYCOSYLATED HEMOGLOBIN (HGB A1C): HbA1c, POC (controlled diabetic range): 6.6 % (ref 0.0–7.0)

## 2022-04-26 ENCOUNTER — Encounter: Payer: Self-pay | Admitting: Surgery

## 2022-04-26 ENCOUNTER — Ambulatory Visit (INDEPENDENT_AMBULATORY_CARE_PROVIDER_SITE_OTHER): Payer: 59 | Admitting: Surgery

## 2022-04-26 DIAGNOSIS — G5601 Carpal tunnel syndrome, right upper limb: Secondary | ICD-10-CM

## 2022-04-26 NOTE — Progress Notes (Signed)
Office Visit Note   Patient: Monica Huang           Date of Birth: November 13, 1981           MRN: [id number] Visit Date: 04/26/2022              Requested by: Lavonda Jumbo, DO [address] [place],  [place] [address] PCP: Lavonda Jumbo, DO   Assessment & Plan: Visit Diagnoses:  1. Carpal tunnel syndrome, right upper limb   Possible double crush phenomenon increasing right hand symptoms  Plan: At this point I will refer patient to Dr. Frazier Butt get his input.  I think patient may possibly benefit from outpatient right carpal tunnel release.  She can follow-up with Dr. Otelia Sergeant for recheck of her neck.  All questions answered.  Follow-Up Instructions: Return in about 5 days (around 05/01/2022) for With Dr. Frazier Butt to discuss possible right carpal tunnel release.   Orders:  No orders of the defined types were placed in this encounter.  No orders of the defined types were placed in this encounter.     Procedures: No procedures performed   Clinical Data: No additional findings.   Subjective: Chief Complaint  Patient presents with   Left Hand - Follow-up   Right Hand - Follow-up    HPI 41 year old black female comes in with complaints of right hand pain and numbness and left hand numbness.  States the numbness has been ongoing since her cervical fusion in 2021.  States that the right hand pain and numbness is more bothersome.  She did have NCV/EMG study with Dr. Alvester Morin November 30, 2021 and that study showed:  EMG & NCV Findings: Evaluation of the right median (across palm) sensory nerve showed prolonged distal peak latency (Wrist, 3.9 ms).  All remaining nerves (as indicated in the following tables) were within normal limits.  All left vs. right side differences were within normal limits.     All examined muscles (as indicated in the following table) showed no evidence of electrical instability.     Impression: The above electrodiagnostic study is ABNORMAL and  reveals evidence of a mild right median nerve entrapment at the wrist (carpal tunnel syndrome) affecting sensory components. No left sided findings. This test cannot rule out sensory only radiculopathy or radiculitis   She is try to manage this conservatively with wrist braces and cream for her hands. Review of Systems No complaints of cardiopulmonary GI/GU issue  Objective: Vital Signs: There were no vitals taken for this visit.  Physical Exam Constitutional:      Appearance: She is obese.  HENT:     Nose: Nose normal.  Pulmonary:     Effort: No respiratory distress.  Musculoskeletal:     Comments: Right elbow negative Tinel's of the cubital tunnel.  Right wrist positive Tinel's and Phalen's test.  Left wrist negative Tinel's.  Neurological:     Mental Status: She is alert.    Ortho Exam  Specialty Comments:  No specialty comments available.  Imaging: No results found.   PMFS History: Patient Active Problem List   Diagnosis Date Noted   Low back pain potentially associated with radiculopathy 02/20/2022   Spinal stenosis of cervical region 02/20/2022   Skin lesion of chest wall 06/30/2021   Diabetes mellitus without complication (HCC)    S/P cervical spinal fusion 05/26/2020   Herniation of cervical intervertebral disc with radiculopathy 05/25/2020    Class: Chronic   Spondylosis without myelopathy or radiculopathy, cervical region 05/25/2020  Class: Chronic   Suspected sleep apnea 03/11/2020   Chronic pain of both knees 10/23/2019   Cervical radiculitis 03/10/2019   Prediabetes 02/06/2019   Carpal tunnel syndrome 02/03/2019   Anxiety and depression 06/10/2016   Other optic atrophy, 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   Essential hypertension, benign 05/10/2015   Mechanical complication-ventricular([facility]) communicating shunt (HCC) 03/05/2013   Hidradenitis suppurativa 10/15/2012   Tobacco abuse 01/03/2012   Legally blind 01/03/2012   Obesity 01/03/2012   Marijuana smoker  01/03/2012   Past Medical History:  Diagnosis Date   Ambulates with cane    Chronic back pain    Chronic leg pain    Diabetes mellitus without complication (HCC)    Type II - no meds   Dizziness    Hydradenitis    Hyperlipidemia    diet controlle - no meds   Legally blind    uses cane - some limited vision   Macular degeneration    patient denies this dx  (dr street dx)   Pneumonia    x 1   Pseudotumor cerebri syndrome 2005   shunt placed- and legally blind   Sciatica    Sleep apnea    "Mild" - has appt on 06/23/20 to see about cpap machine   Wears partial dentures    upper and lower    Family History  Problem Relation Age of Onset   Diabetes Mother    Hypertension Mother    Diabetes Father     Past Surgical History:  Procedure Laterality Date   ANTERIOR CERVICAL DECOMP/DISCECTOMY FUSION N/A 05/25/2020   Procedure: ANTERIOR CERVICAL DISCECTOMY AND FUSION CERVICAL FIVE THROUGH CERVICAL SIX WITH PLATES, SCREWS, ALLOGRAFT AND LOCAL BONE GRAFT, VIVIGEN;  Surgeon: Kerrin Champagne, MD;  Location: [facility];  Service: Orthopedics;  Laterality: N/A;   CSF SHUNT     2 revisions   MULTIPLE EXTRACTIONS WITH ALVEOLOPLASTY Bilateral 11/02/2017   Procedure: MULTIPLE EXTRACTION;  Surgeon: Ocie Doyne, DDS;  Location: [facility];  Service: Oral Surgery;  Laterality: Bilateral;   Social History   Occupational History   Not on file  Tobacco Use   Smoking status: Every Day    Packs/day: 0.50    Years: 21.00    Pack years: 10.50    Types: Cigarettes   Smokeless tobacco: Never  Vaping Use   Vaping Use: Never used  Substance and Sexual Activity   Alcohol use: Yes    Comment: occasional   Drug use: Yes    Types: Marijuana    Comment: marijuana -- bag per day. - -4 joints/day   Sexual activity: Yes    Birth control/protection: None    Comment: 1 partners

## 2022-04-28 ENCOUNTER — Other Ambulatory Visit: Payer: Self-pay | Admitting: Family Medicine

## 2022-04-28 DIAGNOSIS — L732 Hidradenitis suppurativa: Secondary | ICD-10-CM

## 2022-05-01 ENCOUNTER — Ambulatory Visit (INDEPENDENT_AMBULATORY_CARE_PROVIDER_SITE_OTHER): Payer: 59 | Admitting: Orthopedic Surgery

## 2022-05-01 ENCOUNTER — Encounter: Payer: Self-pay | Admitting: Orthopedic Surgery

## 2022-05-01 VITALS — BP 114/78 | HR 88 | Ht 68.0 in | Wt 303.0 lb

## 2022-05-01 DIAGNOSIS — G5601 Carpal tunnel syndrome, right upper limb: Secondary | ICD-10-CM | POA: Diagnosis not present

## 2022-05-01 MED ORDER — BETAMETHASONE SOD PHOS & ACET 6 (3-3) MG/ML IJ SUSP
6.0000 mg | INTRAMUSCULAR | Status: AC | PRN
  Administered 2022-05-01: 6 mg via INTRA_ARTICULAR

## 2022-05-01 MED ORDER — LIDOCAINE HCL 1 % IJ SOLN
1.0000 mL | INTRAMUSCULAR | Status: AC | PRN
  Administered 2022-05-01: 1 mL

## 2022-05-01 NOTE — Progress Notes (Signed)
Office Visit Note   Patient: Monica Huang           Date of Birth: 03-30-1981           MRN: [id number] Visit Date: 05/01/2022              Requested by: Lavonda Jumbo, DO [address] [place],  [place] [address] PCP: Lavonda Jumbo, DO   Assessment & Plan: Visit Diagnoses:  1. Carpal tunnel syndrome of right wrist     Plan: Discussed with patient that her history exam findings do seem consistent with carpal tunnel syndrome on the right.  Slowly complicated because her symptoms started after a cervical fusion back in 2021.  We discussed the nature of carpal tunnel syndrome as well as diagnosis, prognosis, both conservative and surgical treatment options.  She did have an EMG/nerve conduction study recently was suggested and mild right carpal tunnel syndrome.  After discussion, she would like to proceed with a corticosteroid junction into the right carpal tunnel.  We discussed the risk of injection including bleeding, infection, and alterations in her blood sugar given her questionable diabetes.  She will keep an eye on her blood sugar over the next several weeks.  I can see her back in the office in a few months.  Follow-Up Instructions: No follow-ups on file.   Orders:  No orders of the defined types were placed in this encounter.  No orders of the defined types were placed in this encounter.     Procedures: Hand/UE Inj: R carpal tunnel for carpal tunnel syndrome on 05/01/2022 9:58 AM Indications: therapeutic and diagnostic Details: 25 G needle, volar approach Medications: 1 mL lidocaine 1 %; 6 mg betamethasone acetate-betamethasone sodium phosphate 6 (3-3) MG/ML Procedure, treatment alternatives, risks and benefits explained, specific risks discussed. Consent was given by the patient. Immediately prior to procedure a time out was called to verify the correct patient, procedure, equipment, support staff and site/side marked as required. Patient was prepped and draped in  the usual sterile fashion.      Clinical Data: No additional findings.   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  Her right is the much bigger issue for her.  She describes numbness and tingling in the entire hand that started after a cervical fusion in 2021.  She says all fingers are involved mostly the fingertips.  She does have nocturnal symptoms that she says happens maybe 2 times a week.  She describes the symptoms as pressure, tingling, and pain in the wrist.  She has been wearing a night brace with some modest symptom relief.  She does have occasional daytime symptoms at work.  She recently had EMG/nerve conduction study which suggested mild right carpal tunnel syndrome.  She does have have a history of diabetes but is not on any medication.   Review of Systems   Objective: Vital Signs: BP 114/78 (BP Location: Left Arm, Patient Position: Sitting)   Pulse 88   Ht 5\' 8"  (1.727 m)   Wt (!) 303 lb (137.4 kg)   BMI 46.07 kg/m   Physical Exam Constitutional:      Appearance: Normal  appearance.  Cardiovascular:     Rate and Rhythm: Normal rate.     Pulses: Normal pulses.  Pulmonary:     Effort: Pulmonary effort is normal.  Skin:    General: Skin is warm and dry.     Capillary Refill: Capillary refill takes less than 2 seconds.  Neurological:     Mental Status: She is alert.    Right Hand Exam   Tenderness  The patient is experiencing no tenderness.   Other  Erythema: absent Sensation: normal Pulse: present  Comments:  Positive Tinel sign into middle finger. Positive Phalen sign with numbness in all fingers.  Positive  Durkan sign w/ numbness in thumb and index.  4+/5 thenar motor strength.      Specialty Comments:  No specialty comments available.  Imaging: No results found.   PMFS History: Patient Active Problem List   Diagnosis Date Noted   Low back pain potentially associated with radiculopathy 02/20/2022   Spinal stenosis of cervical region 02/20/2022   Skin lesion of chest wall 06/30/2021   Diabetes mellitus without complication (HCC)    S/P cervical spinal fusion 05/26/2020   Herniation of cervical intervertebral disc with radiculopathy 05/25/2020    Class: Chronic   Spondylosis without myelopathy or radiculopathy, cervical region 05/25/2020    Class: Chronic   Suspected sleep apnea 03/11/2020   Chronic pain of both knees 10/23/2019   Cervical radiculitis 03/10/2019   Prediabetes 02/06/2019   Carpal tunnel syndrome 02/03/2019   Anxiety and depression 06/10/2016   Other optic atrophy, 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   Essential hypertension, benign 05/10/2015   Mechanical complication-ventricular([facility]) communicating shunt (HCC) 03/05/2013   Hidradenitis suppurativa 10/15/2012   Tobacco abuse 01/03/2012   Legally blind 01/03/2012   Obesity 01/03/2012   Marijuana smoker 01/03/2012   Past Medical History:  Diagnosis Date   Ambulates with cane    Chronic back pain    Chronic leg pain    Diabetes mellitus without complication (HCC)    Type II - no meds   Dizziness    Hydradenitis    Hyperlipidemia    diet controlle - no meds   Legally blind    uses cane - some limited vision   Macular degeneration    patient denies this dx  (dr street dx)   Pneumonia    x 1   Pseudotumor cerebri syndrome 2005   shunt placed- and legally blind   Sciatica    Sleep apnea    "Mild" - has appt on 06/23/20 to see about cpap machine   Wears partial dentures    upper and lower    Family History  Problem Relation Age of Onset   Diabetes Mother    Hypertension Mother    Diabetes Father     Past  Surgical History:  Procedure Laterality Date   ANTERIOR CERVICAL DECOMP/DISCECTOMY FUSION N/A 05/25/2020   Procedure: ANTERIOR CERVICAL DISCECTOMY AND FUSION CERVICAL FIVE THROUGH CERVICAL SIX WITH PLATES, SCREWS, ALLOGRAFT AND LOCAL BONE GRAFT, VIVIGEN;  Surgeon: Kerrin ChampagneNitka, James E, MD;  Location: [facility];  Service: Orthopedics;  Laterality: N/A;   CSF SHUNT     2 revisions   MULTIPLE EXTRACTIONS WITH ALVEOLOPLASTY Bilateral 11/02/2017   Procedure: MULTIPLE EXTRACTION;  Surgeon: Ocie DoyneJensen, Scott, DDS;  Location: [facility];  Service: Oral Surgery;  Laterality: Bilateral;   Social History   Occupational History   Not on file  Tobacco Use   Smoking status: Every Day    Packs/day: 0.50    Years:  21.00    Pack years: 10.50    Types: Cigarettes   Smokeless tobacco: Never  Vaping Use   Vaping Use: Never used  Substance and Sexual Activity   Alcohol use: Yes    Comment: occasional   Drug use: Yes    Types: Marijuana    Comment: marijuana -- bag per day. - -4 joints/day   Sexual activity: Yes    Birth control/protection: None    Comment: 1 partners

## 2022-05-02 ENCOUNTER — Encounter: Payer: Self-pay | Admitting: *Deleted

## 2022-05-04 ENCOUNTER — Ambulatory Visit: Payer: 59 | Admitting: Family Medicine

## 2022-05-04 ENCOUNTER — Ambulatory Visit: Payer: 59 | Admitting: Surgery

## 2022-05-10 ENCOUNTER — Ambulatory Visit: Payer: 59 | Admitting: Family Medicine

## 2022-05-11 ENCOUNTER — Other Ambulatory Visit (HOSPITAL_COMMUNITY)
Admission: RE | Admit: 2022-05-11 | Discharge: 2022-05-11 | Disposition: A | Discharge status: Home / Self Care | Payer: 59 | Source: Ambulatory Visit | Attending: Family Medicine | Admitting: Family Medicine

## 2022-05-11 ENCOUNTER — Ambulatory Visit (INDEPENDENT_AMBULATORY_CARE_PROVIDER_SITE_OTHER): Payer: 59 | Admitting: Family Medicine

## 2022-05-11 VITALS — BP 115/80 | HR 96 | Temp 98.2°F | Ht 68.0 in | Wt 306.0 lb

## 2022-05-11 DIAGNOSIS — Z01411 Encounter for gynecological examination (general) (routine) with abnormal findings: Secondary | ICD-10-CM | POA: Diagnosis present

## 2022-05-11 DIAGNOSIS — Z1151 Encounter for screening for human papillomavirus (HPV): Secondary | ICD-10-CM | POA: Diagnosis not present

## 2022-05-11 DIAGNOSIS — N939 Abnormal uterine and vaginal bleeding, unspecified: Secondary | ICD-10-CM | POA: Insufficient documentation

## 2022-05-11 DIAGNOSIS — R3915 Urgency of urination: Secondary | ICD-10-CM

## 2022-05-11 DIAGNOSIS — Z124 Encounter for screening for malignant neoplasm of cervix: Secondary | ICD-10-CM | POA: Diagnosis not present

## 2022-05-11 LAB — POCT URINALYSIS DIP (MANUAL ENTRY)
Glucose, UA: NEGATIVE mg/dL
Leukocytes, UA: NEGATIVE
Nitrite, UA: NEGATIVE
Spec Grav, UA: 1.025 (ref 1.010–1.025)
Urobilinogen, UA: 0.2 E.U./dL
pH, UA: 6 (ref 5.0–8.0)

## 2022-05-11 LAB — POCT WET PREP (WET MOUNT)
Clue Cells Wet Prep Whiff POC: POSITIVE
Trichomonas Wet Prep HPF POC: ABSENT

## 2022-05-11 LAB — POCT UA - MICROSCOPIC ONLY

## 2022-05-11 LAB — POCT URINE PREGNANCY: Preg Test, Ur: NEGATIVE

## 2022-05-11 NOTE — Progress Notes (Signed)
    SUBJECTIVE:   CHIEF COMPLAINT / HPI:   Menstrual bleeding, blood in urine X1 week has notice blood in her urine and a little on the tissue when she wipes. She has not had a normal period in several weeks and she is usually normal. She endorses urinary urgency, low abdominal cramping and low back pain. She denies fever and dysuria. She is sexually active and not on any form of birth control. She denies breast tenderness, nausea, and vomiting. She desires a referral to the OBGYN.   PERTINENT  PMH / PSH: DM  OBJECTIVE:   BP 115/80   Pulse 96   Temp 98.2 F (36.8 C)   Ht 5\' 8"  (1.727 m)   Wt (!) 306 lb (138.8 kg)   SpO2 97%   BMI 46.53 kg/m   General: Appears well, no acute distress. Age appropriate. Respiratory: normal effort Abdomen: soft, nontender, nondistended. No CVA tenderness.  Pelvic exam: VULVA: normal appearing vulva with no masses, tenderness or lesions, VAGINA: normal appearing vagina with normal color and discharge, no lesions, CERVIX: cervical discharge present - bloody and scant, PAP: Pap smear done today, WET MOUNT done - results: negative for pathogens, few clue cells, exam chaperoned by , CMA. Extremities: No edema or cyanosis. Skin: Warm and dry, no rashes noted Neuro: alert and oriented Psych: normal affect  ASSESSMENT/PLAN:   1. Abnormal uterine bleeding Previously regular cycle.  Endorsing abnormal bleeding for several weeks.  Urine pregnancy test is negative today.  Encouraged to retest in several weeks.  Wet mount unremarkable.  Pap performed today we will follow-up on this as well as additional infectious screenings.  Refer to OB/GYN per patient request.  Consider malignancy, early menopausal symptoms versus infection versus early pregnancy. - Cervicovaginal ancillary only - POCT Wet Prep [facility]) - POCT urine pregnancy - Ambulatory referral to Obstetrics / Gynecology - POCT urinalysis dipstick - Hemoglobin; Future  2. Screening  for cervical cancer - Cytology - PAP  3. Urinary urgency Questionable symptom.  No other systemic symptoms or CVA tenderness.  Will follow urine culture. - POCT urinalysis dipstick - Urine Culture - POCT urinalysis dipstick - POCT UA - Microscopic Only  [facility], DO [place] [facility]

## 2022-05-11 NOTE — Patient Instructions (Addendum)
It was wonderful to see you today.  Please bring ALL of your medications with you to every visit.   Today we talked about:  Menstrual bleeding v. Blood in urine. I will call with abnormal results and mychart message you with normal results. I have referred you to [facility] as you wish. Consider a form of birth control is pregnancy is not desired.   Please be sure to schedule follow up at the front  desk before you leave today.   If you haven't already, sign up for My Chart to have easy access to your labs results, and communication with your primary care physician.  Please call the clinic at [phone] if your symptoms worsen or you have any concerns. It was our pleasure to serve you.  Dr. Salvadore Dom

## 2022-05-12 LAB — CERVICOVAGINAL ANCILLARY ONLY
Chlamydia: NEGATIVE
Comment: NEGATIVE
Comment: NORMAL
Neisseria Gonorrhea: NEGATIVE

## 2022-05-13 LAB — URINE CULTURE

## 2022-05-16 LAB — CYTOLOGY - PAP
Comment: NEGATIVE
Diagnosis: NEGATIVE
High risk HPV: NEGATIVE

## 2022-05-18 ENCOUNTER — Telehealth: Payer: Self-pay | Admitting: Family Medicine

## 2022-05-18 MED ORDER — METRONIDAZOLE 500 MG PO TABS: 500.0000 mg | ORAL_TABLET | Freq: Two times a day (BID) | ORAL | 0 refills | Status: AC

## 2022-05-18 NOTE — Telephone Encounter (Signed)
BV tx to pharmacy. Patient made aware.   Mathias Bogacki Autry-Lott, DO 05/18/2022, 1:30 PM PGY-3, [facility]

## 2022-05-19 ENCOUNTER — Ambulatory Visit (INDEPENDENT_AMBULATORY_CARE_PROVIDER_SITE_OTHER): Payer: 59 | Admitting: Family Medicine

## 2022-05-19 ENCOUNTER — Ambulatory Visit: Payer: 59

## 2022-05-19 VITALS — BP 121/70 | HR 102 | Ht 68.0 in | Wt 306.4 lb

## 2022-05-19 DIAGNOSIS — M5414 Radiculopathy, thoracic region: Secondary | ICD-10-CM

## 2022-05-19 DIAGNOSIS — Z981 Arthrodesis status: Secondary | ICD-10-CM | POA: Diagnosis not present

## 2022-05-19 DIAGNOSIS — M4802 Spinal stenosis, cervical region: Secondary | ICD-10-CM

## 2022-05-19 DIAGNOSIS — M47812 Spondylosis without myelopathy or radiculopathy, cervical region: Secondary | ICD-10-CM

## 2022-05-19 DIAGNOSIS — M5412 Radiculopathy, cervical region: Secondary | ICD-10-CM

## 2022-05-19 DIAGNOSIS — G5601 Carpal tunnel syndrome, right upper limb: Secondary | ICD-10-CM

## 2022-05-19 MED ORDER — PREGABALIN 75 MG PO CAPS: 75.0000 mg | ORAL_CAPSULE | Freq: Two times a day (BID) | ORAL | 1 refills | Status: DC

## 2022-05-19 NOTE — Patient Instructions (Addendum)
It was a pleasure to see you today!  For lyrica: start off with 75 mg twice a day. After 2-3 days you can increase to 150 mg (2 pills) twice a day. If your pain is not controlled with that you can increase to 300 mg (4 pills) twice a day in 2-3 weeks. I have placed a referral for physical therapy. You should receive a phone call in 1-2 weeks to schedule this appointment. If for any reason you do not receive a phone call or need more help scheduling this appointment, please call our office at [phone]. Follow up with me or with orthopedics in 3-4 weeks  Be Well,  Dr. Boris Lown

## 2022-05-19 NOTE — Progress Notes (Signed)
    SUBJECTIVE:   CHIEF COMPLAINT / HPI: spot on back/shoulder of pain/numbness  Numbness/tingling of back: Patient reports several weeks of burning, tingling and numbness at a spot under her right shoulder blade. She has significant h/o cervical radiculopathy and spondylosis, s/p ACDF 2021 C5-C6. She has had extensive imaging both CT and MR of cervical spine and L shoulder, not R shoulder or thoracic spine. She also has h/o carpal tunnel in R wrist (works in [employer], uses hands), being treated by orthopedics. She also has a history of numbness of her L arm since her cervical fusion. She has previously tried gabapentin and did not have satisfactory results. She has tried lyrica, only 75 mg, which made her sleepy, but did not address pain.   PERTINENT  PMH / PSH: h/o spondylosis, herniation of cervical disc, cervical spinal stenosis, s/p spinal fusion, legally blind, R carpal tunnel  OBJECTIVE:   BP 121/70   Pulse (!) 102   Ht 5\' 8"  (1.727 m)   Wt (!) 306 lb 6 oz (139 kg)   LMP 05/02/2022   SpO2 100%   BMI 46.58 kg/m   Nursing note and vitals reviewed GEN: age-appropriate, AAW, resting comfortably in chair, NAD, class III obesity HEENT: NCAT. PERRLA. Sclera without injection or icterus. MMM.  Neck: Supple. ROM intact, no LAD Cardiac: Regular rate and rhythm. Normal S1/S2. No murmurs, rubs, or gallops appreciated. 2+ radial pulses. Lungs: Clear bilaterally to ascultation. No increased WOB, no accessory muscle usage. No w/r/r. Thoracic spine: - Inspection: no gross deformity or asymmetry, swelling or ecchymosis. No skin changes - Palpation: No TTP over the spinous processes; + TTP on right thoracic paraspinal muscles below scapula ~ dermatome T4-T6. - ROM: full ROM  - Strength: 5/5 strength of b/l upper extremities - Neuro: 2+ brachial/biceps reflexes, diminished sensation in left arm (baseline), normal sensation in R am Ext: no edema Psych: Pleasant and appropriate   ASSESSMENT/PLAN:   Thoracic radiculopathy 41 yo woman with significant PMH of spinal radiculopathy presents today with new problem area of mid right thoracic back. Patient has no signs on exam to indicate an acutely decompensating or emergent process such as spinal cord compression. Reviewed her extensive imaging, which does not include her thoracic spine. Plan is to treat pain, initiate physical therapy, and have patient follow up in 2-4 weeks. If no improvement, suggest obtaining imaging at that time. - lyrica 75 mg BID, can increase to 150 mg in 2-3 days if 75 mg does not treat sufficiently, patient can titrate to 300 mg BID in 2-3 weeks     24, MD [facility]

## 2022-05-22 ENCOUNTER — Telehealth: Payer: Self-pay | Admitting: Family Medicine

## 2022-05-22 NOTE — Telephone Encounter (Signed)
On day 3 of treatment for BV.  Made aware of normal Pap results.  Lavonda Jumbo, DO 05/22/2022, 4:15 PM PGY-3, [facility]

## 2022-05-24 ENCOUNTER — Ambulatory Visit: Payer: Self-pay | Admitting: Physical Therapy

## 2022-05-25 DIAGNOSIS — M5414 Radiculopathy, thoracic region: Secondary | ICD-10-CM | POA: Insufficient documentation

## 2022-05-25 NOTE — Assessment & Plan Note (Signed)
41 yo woman with significant PMH of spinal radiculopathy presents today with new problem area of mid right thoracic back. Patient has no signs on exam to indicate an acutely decompensating or emergent process such as spinal cord compression. Reviewed her extensive imaging, which does not include her thoracic spine. Plan is to treat pain, initiate physical therapy, and have patient follow up in 2-4 weeks. If no improvement, suggest obtaining imaging at that time. - lyrica 75 mg BID, can increase to 150 mg in 2-3 days if 75 mg does not treat sufficiently, patient can titrate to 300 mg BID in 2-3 weeks

## 2022-05-26 ENCOUNTER — Other Ambulatory Visit: Payer: Self-pay | Admitting: Specialist

## 2022-05-26 ENCOUNTER — Ambulatory Visit (INDEPENDENT_AMBULATORY_CARE_PROVIDER_SITE_OTHER): Payer: 59 | Admitting: Specialist

## 2022-05-26 ENCOUNTER — Encounter: Payer: Self-pay | Admitting: Specialist

## 2022-05-26 VITALS — BP 133/79 | HR 87 | Ht 68.0 in | Wt 306.0 lb

## 2022-05-26 DIAGNOSIS — M7541 Impingement syndrome of right shoulder: Secondary | ICD-10-CM

## 2022-05-26 DIAGNOSIS — M19012 Primary osteoarthritis, left shoulder: Secondary | ICD-10-CM

## 2022-05-26 DIAGNOSIS — M4802 Spinal stenosis, cervical region: Secondary | ICD-10-CM | POA: Diagnosis not present

## 2022-05-26 DIAGNOSIS — M542 Cervicalgia: Secondary | ICD-10-CM | POA: Diagnosis not present

## 2022-05-26 DIAGNOSIS — M778 Other enthesopathies, not elsewhere classified: Secondary | ICD-10-CM | POA: Diagnosis not present

## 2022-05-26 DIAGNOSIS — G5601 Carpal tunnel syndrome, right upper limb: Secondary | ICD-10-CM

## 2022-05-26 DIAGNOSIS — M19011 Primary osteoarthritis, right shoulder: Secondary | ICD-10-CM

## 2022-05-26 NOTE — Progress Notes (Signed)
Office Visit Note   Patient: Monica Huang           Date of Birth: 1981/03/03           MRN: [id number] Visit Date: 05/26/2022              Requested by: Lavonda Jumbo, DO [address] [place],  [place] [address] PCP: Lavonda Jumbo, DO   Assessment & Plan: Visit Diagnoses:  1. Carpal tunnel syndrome of right wrist   2. Carpal tunnel syndrome, right upper limb   3. Left shoulder tendonitis   4. Spinal stenosis of cervical region   5. Cervicalgia   6. Acromioclavicular arthrosis, bilateral   7. Impingement syndrome of right shoulder     Plan: Avoid overhead lifting and overhead use of the arms. Do not lift greater than 5 lbs. Adjust head rest in vehicle to prevent hyperextension if rear ended. Take extra precautions to avoid falling. Avoid bending, stooping and avoid lifting weights greater than 10 lbs. Avoid prolong standing and walking. Avoid frequent bending and stooping  No lifting greater than 10 lbs. May use ice or moist heat for pain. Weight loss is of benefit. Handicap license is approved. I think that you have difficulty with neck and shoulder pain due to persistent cervical stenosis and this causes central neck and periscapular pain. The left shoulder has arthrosis of the [facility] joint and mild tendonitis. There is carpal tunnel syndrome I think these conditions make it difficult to work full time and can cause you to have periods where the pain is such that you have to stop work. The risks associated with surgical treatment are concerning and ultimately surgery may be necessary.  I will continue to follow you and assist you in anyway I can.  Follow-Up Instructions: No follow-ups on file.   Orders:  No orders of the defined types were placed in this encounter.  No orders of the defined types were placed in this encounter.     Procedures: No procedures performed   Clinical Data: No additional findings.   Subjective: Chief Complaint  Patient  presents with   Neck - Pain, Follow-up   Left Shoulder - Follow-up, Pain    HPI  Review of Systems   Objective: Vital Signs: BP 133/79   Pulse 87   Ht 5\' 8"  (1.727 m)   Wt (!) 306 lb (138.8 kg)   LMP 05/02/2022   BMI 46.53 kg/m   Physical Exam  Ortho Exam  Specialty Comments:  No specialty comments available.  Imaging: No results found.   PMFS History: Patient Active Problem List   Diagnosis Date Noted   Herniation of cervical intervertebral disc with radiculopathy 05/25/2020    Priority: High    Class: Chronic   Spondylosis without myelopathy or radiculopathy, cervical region 05/25/2020    Priority: High    Class: Chronic   Thoracic radiculopathy 05/25/2022   Abnormal uterine bleeding 05/11/2022   Screening for cervical cancer 05/11/2022   Urinary urgency 05/11/2022   Low back pain potentially associated with radiculopathy 02/20/2022   Spinal stenosis of cervical region 02/20/2022   Skin lesion of chest wall 06/30/2021   Diabetes mellitus without complication (HCC)    S/P cervical spinal fusion 05/26/2020   Suspected sleep apnea 03/11/2020   Chronic pain of both knees 10/23/2019   Cervical radiculitis 03/10/2019   Prediabetes 02/06/2019   Carpal tunnel syndrome 02/03/2019   Anxiety and depression 06/10/2016   Other optic atrophy, 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  Essential hypertension, benign 05/10/2015   Mechanical complication-ventricular(CSF) communicating shunt (HCC) 03/05/2013   Hidradenitis suppurativa 10/15/2012   Tobacco abuse 01/03/2012   Legally blind 01/03/2012   Obesity 01/03/2012   Marijuana smoker 01/03/2012   Past Medical History:  Diagnosis Date   Ambulates with cane    Chronic back pain    Chronic leg pain    Diabetes mellitus without complication (HCC)    Type II - no meds   Dizziness    Hydradenitis    Hyperlipidemia    diet controlle - no meds   Legally blind    uses cane - some limited vision   Macular degeneration    patient  denies this dx  (dr street dx)   Pneumonia    x 1   Pseudotumor cerebri syndrome 2005   shunt placed- and legally blind   Sciatica    Sleep apnea    "Mild" - has appt on 06/23/20 to see about cpap machine   Wears partial dentures    upper and lower    Family History  Problem Relation Age of Onset   Diabetes Mother    Hypertension Mother    Diabetes Father     Past Surgical History:  Procedure Laterality Date   ANTERIOR CERVICAL DECOMP/DISCECTOMY FUSION N/A 05/25/2020   Procedure: ANTERIOR CERVICAL DISCECTOMY AND FUSION CERVICAL FIVE THROUGH CERVICAL SIX WITH PLATES, SCREWS, ALLOGRAFT AND LOCAL BONE GRAFT, VIVIGEN;  Surgeon: Kerrin Champagne, MD;  Location: [facility] OR;  Service: Orthopedics;  Laterality: N/A;   CSF SHUNT     2 revisions   MULTIPLE EXTRACTIONS WITH ALVEOLOPLASTY Bilateral 11/02/2017   Procedure: MULTIPLE EXTRACTION;  Surgeon: Ocie Doyne, DDS;  Location: [facility] OR;  Service: Oral Surgery;  Laterality: Bilateral;   Social History   Occupational History   Not on file  Tobacco Use   Smoking status: Every Day    Packs/day: 0.50    Years: 21.00    Total pack years: 10.50    Types: Cigarettes   Smokeless tobacco: Never  Vaping Use   Vaping Use: Never used  Substance and Sexual Activity   Alcohol use: Yes    Comment: occasional   Drug use: Yes    Types: Marijuana    Comment: marijuana -- bag per day. - -4 joints/day   Sexual activity: Yes    Birth control/protection: None    Comment: 1 partners

## 2022-05-26 NOTE — Patient Instructions (Signed)
Plan: Avoid overhead lifting and overhead use of the arms. Do not lift greater than 5 lbs. Adjust head rest in vehicle to prevent hyperextension if rear ended. Take extra precautions to avoid falling. Avoid bending, stooping and avoid lifting weights greater than 10 lbs. Avoid prolong standing and walking. Avoid frequent bending and stooping  No lifting greater than 10 lbs. May use ice or moist heat for pain. Weight loss is of benefit. Handicap license is approved. I think that you have difficulty with neck and shoulder pain due to persistent cervical stenosis and this causes central neck and periscapular pain. The left shoulder has arthrosis of the [facility] joint and mild tendonitis. There is carpal tunnel syndrome I think these conditions make it difficult to work full time and can cause you to have periods where the pain is such that you have to stop work. The risks associated with surgical treatment are concerning and ultimately surgery may be necessary.  I will continue to follow you and assist you in anyway I can.

## 2022-05-29 ENCOUNTER — Telehealth: Payer: Self-pay | Admitting: Specialist

## 2022-05-29 ENCOUNTER — Other Ambulatory Visit: Payer: Self-pay | Admitting: Specialist

## 2022-05-29 MED ORDER — TRAMADOL HCL 50 MG PO TABS: 50.0000 mg | ORAL_TABLET | Freq: Four times a day (QID) | ORAL | 0 refills | Status: DC | PRN

## 2022-05-29 NOTE — Telephone Encounter (Signed)
Patient called. She would like something for pain called in for her that's stronger than what she is taking. Her call back number is [phone]

## 2022-06-06 ENCOUNTER — Telehealth: Payer: Self-pay | Admitting: Specialist

## 2022-06-06 NOTE — Telephone Encounter (Signed)
[place] forms received. To [employer]. ?

## 2022-06-06 NOTE — Telephone Encounter (Signed)
Received $25.00 cash,medical records release form/Forwarding to [employer] today

## 2022-06-15 ENCOUNTER — Ambulatory Visit: Payer: 59 | Admitting: Orthopedic Surgery

## 2022-06-30 ENCOUNTER — Ambulatory Visit: Payer: 59 | Admitting: Orthopedic Surgery

## 2022-07-02 ENCOUNTER — Other Ambulatory Visit: Payer: Self-pay | Admitting: Specialist

## 2022-07-07 ENCOUNTER — Encounter: Payer: Self-pay | Admitting: Specialist

## 2022-07-07 ENCOUNTER — Encounter: Payer: Self-pay | Admitting: Radiology

## 2022-07-07 ENCOUNTER — Ambulatory Visit (INDEPENDENT_AMBULATORY_CARE_PROVIDER_SITE_OTHER): Payer: 59 | Admitting: Specialist

## 2022-07-07 VITALS — BP 115/78 | HR 87 | Ht 68.0 in | Wt 306.0 lb

## 2022-07-07 DIAGNOSIS — R202 Paresthesia of skin: Secondary | ICD-10-CM | POA: Diagnosis not present

## 2022-07-07 DIAGNOSIS — M4712 Other spondylosis with myelopathy, cervical region: Secondary | ICD-10-CM | POA: Diagnosis not present

## 2022-07-07 DIAGNOSIS — G5692 Unspecified mononeuropathy of left upper limb: Secondary | ICD-10-CM

## 2022-07-07 DIAGNOSIS — G5601 Carpal tunnel syndrome, right upper limb: Secondary | ICD-10-CM

## 2022-07-07 DIAGNOSIS — R200 Anesthesia of skin: Secondary | ICD-10-CM

## 2022-07-07 NOTE — Progress Notes (Signed)
Office Visit Note   Patient: Monica Huang           Date of Birth: 1981/04/02           MRN: [id number] Visit Date: 07/07/2022              Requested by: Alicia Amel, MD [address] [place],  [place] [address] PCP: Alicia Amel, MD   Assessment & Plan: Visit Diagnoses:  1. Other spondylosis with myelopathy, cervical region   2. Neuropathy, arm, left   3. Paresthesia of skin   4. Carpal tunnel syndrome, right upper limb   5. Bilateral arm numbness and tingling while sleeping     Plan: Avoid overhead lifting and overhead use of the arms. Do not lift greater than 5 lbs. Adjust head rest in vehicle to prevent hyperextension if rear ended. Take extra precautions to avoid falling. Neurology evaluation assess for neuropathy vs chronic myelopathy Combination of legal blindness and chronic cervical myelopathty with bilateral hand numbness  Make the ability to perform work not possible and I believe you are totally disabled due to this.  Follow-Up Instructions: No follow-ups on file.   Orders:  No orders of the defined types were placed in this encounter.  No orders of the defined types were placed in this encounter.     Procedures: No procedures performed   Clinical Data: No additional findings.   Subjective: Chief Complaint  Patient presents with   Neck - Follow-up    HPI  Review of Systems   Objective: Vital Signs: BP 115/78 (BP Location: Left Arm, Patient Position: Sitting)   Pulse 87   Ht 5\' 8"  (1.727 m)   Wt (!) 306 lb (138.8 kg)   BMI 46.53 kg/m   Physical Exam  Ortho Exam  Specialty Comments:  No specialty comments available.  Imaging: No results found.   PMFS History: Patient Active Problem List   Diagnosis Date Noted   Herniation of cervical intervertebral disc with radiculopathy 05/25/2020    Priority: High    Class: Chronic   Spondylosis without myelopathy or radiculopathy, cervical region 05/25/2020    Priority: High     Class: Chronic   Thoracic radiculopathy 05/25/2022   Abnormal uterine bleeding 05/11/2022   Screening for cervical cancer 05/11/2022   Urinary urgency 05/11/2022   Low back pain potentially associated with radiculopathy 02/20/2022   Spinal stenosis of cervical region 02/20/2022   Skin lesion of chest wall 06/30/2021   Diabetes mellitus without complication (HCC)    S/P cervical spinal fusion 05/26/2020   Suspected sleep apnea 03/11/2020   Chronic pain of both knees 10/23/2019   Cervical radiculitis 03/10/2019   Prediabetes 02/06/2019   Carpal tunnel syndrome 02/03/2019   Anxiety and depression 06/10/2016   Other optic atrophy, 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   Essential hypertension, benign 05/10/2015   Mechanical complication-ventricular(CSF) communicating shunt (HCC) 03/05/2013   Hidradenitis suppurativa 10/15/2012   Tobacco abuse 01/03/2012   Legally blind 01/03/2012   Obesity 01/03/2012   Marijuana smoker 01/03/2012   Past Medical History:  Diagnosis Date   Ambulates with cane    Chronic back pain    Chronic leg pain    Diabetes mellitus without complication (HCC)    Type II - no meds   Dizziness    Hydradenitis    Hyperlipidemia    diet controlle - no meds   Legally blind    uses cane - some limited vision   Macular degeneration  patient denies this dx  (dr street dx)   Pneumonia    x 1   Pseudotumor cerebri syndrome 2005   shunt placed- and legally blind   Sciatica    Sleep apnea    "Mild" - has appt on 06/23/20 to see about cpap machine   Wears partial dentures    upper and lower    Family History  Problem Relation Age of Onset   Diabetes Mother    Hypertension Mother    Diabetes Father     Past Surgical History:  Procedure Laterality Date   ANTERIOR CERVICAL DECOMP/DISCECTOMY FUSION N/A 05/25/2020   Procedure: ANTERIOR CERVICAL DISCECTOMY AND FUSION CERVICAL FIVE THROUGH CERVICAL SIX WITH PLATES, SCREWS, ALLOGRAFT AND LOCAL BONE GRAFT, VIVIGEN;  Surgeon:  Kerrin Champagne, MD;  Location: [facility] OR;  Service: Orthopedics;  Laterality: N/A;   CSF SHUNT     2 revisions   MULTIPLE EXTRACTIONS WITH ALVEOLOPLASTY Bilateral 11/02/2017   Procedure: MULTIPLE EXTRACTION;  Surgeon: Ocie Doyne, DDS;  Location: [facility] OR;  Service: Oral Surgery;  Laterality: Bilateral;   Social History   Occupational History   Not on file  Tobacco Use   Smoking status: Every Day    Packs/day: 0.50    Years: 21.00    Total pack years: 10.50    Types: Cigarettes   Smokeless tobacco: Never  Vaping Use   Vaping Use: Never used  Substance and Sexual Activity   Alcohol use: Yes    Comment: occasional   Drug use: Yes    Types: Marijuana    Comment: marijuana -- bag per day. - -4 joints/day   Sexual activity: Yes    Birth control/protection: None    Comment: 1 partners

## 2022-07-07 NOTE — Addendum Note (Signed)
Addended by: Vira Browns on: 07/07/2022 01:24 PM   Modules accepted: Orders

## 2022-07-07 NOTE — Patient Instructions (Addendum)
Plan: Avoid overhead lifting and overhead use of the arms. Do not lift greater than 5 lbs. Adjust head rest in vehicle to prevent hyperextension if rear ended. Take extra precautions to avoid falling. Neurology evaluation assess for neuropathy vs chronic myelopathy Combination of legal blindness and chronic cervical myelopathty with bilateral hand numbness  Make the ability to perform work not possible and I believe you are totally disabled due to this. Rferral to Occupational Therapy to maximize use of hands  and function despite permanent hand sensory changes.   Follow-Up Instructions: No follow-ups on file.

## 2022-07-11 ENCOUNTER — Telehealth: Payer: Self-pay | Admitting: Specialist

## 2022-07-11 NOTE — Telephone Encounter (Signed)
[place] forms received. To [employer]. ?

## 2022-07-17 ENCOUNTER — Ambulatory Visit: Payer: 59 | Admitting: Occupational Therapy

## 2022-07-17 ENCOUNTER — Telehealth: Payer: Self-pay | Admitting: Specialist

## 2022-07-17 NOTE — Telephone Encounter (Signed)
Pt called requesting a call from [facility].. Pt did not leave a reason for call back. Pt phone number is [phone].

## 2022-07-18 ENCOUNTER — Telehealth: Payer: Self-pay | Admitting: Specialist

## 2022-07-18 NOTE — Telephone Encounter (Signed)
Received $25.00 cash and medical records release form/forwarding to [employer] today

## 2022-07-19 NOTE — Telephone Encounter (Signed)
Lmom for pt to call back and to leae a DETAILED message with the front desk as to why she is calling.

## 2022-07-24 ENCOUNTER — Other Ambulatory Visit: Payer: Self-pay | Admitting: Specialist

## 2022-07-24 ENCOUNTER — Ambulatory Visit: Payer: 59 | Admitting: Obstetrics and Gynecology

## 2022-07-26 ENCOUNTER — Ambulatory Visit: Payer: 59 | Attending: Specialist | Admitting: Occupational Therapy

## 2022-08-11 NOTE — Telephone Encounter (Signed)
Patient called in stating she would like to consult with a nurse about going back to work, she is scheduled to go back to work Monday and she would like to know what restrictions she may have and she will need a note please call and advise.

## 2022-08-14 NOTE — Telephone Encounter (Signed)
I called and lmom advised that she would have to be seen by MD to discuss restrictions, that I can not make them, I advised that there is an appointment this afternoon that she can discuss this with Dr. Louanne Skye. To call back and make appointment.

## 2022-08-16 NOTE — Telephone Encounter (Signed)
If patient calls back please schedule her with MD to be seen

## 2022-08-17 ENCOUNTER — Ambulatory Visit (INDEPENDENT_AMBULATORY_CARE_PROVIDER_SITE_OTHER): Payer: 59 | Admitting: Specialist

## 2022-08-17 ENCOUNTER — Encounter: Payer: Self-pay | Admitting: Specialist

## 2022-08-17 VITALS — BP 121/85 | HR 88 | Ht 68.0 in | Wt 306.0 lb

## 2022-08-17 DIAGNOSIS — M4712 Other spondylosis with myelopathy, cervical region: Secondary | ICD-10-CM | POA: Diagnosis not present

## 2022-08-17 DIAGNOSIS — G5692 Unspecified mononeuropathy of left upper limb: Secondary | ICD-10-CM

## 2022-08-17 DIAGNOSIS — M19012 Primary osteoarthritis, left shoulder: Secondary | ICD-10-CM

## 2022-08-17 DIAGNOSIS — M778 Other enthesopathies, not elsewhere classified: Secondary | ICD-10-CM

## 2022-08-17 DIAGNOSIS — G959 Disease of spinal cord, unspecified: Secondary | ICD-10-CM | POA: Diagnosis not present

## 2022-08-17 DIAGNOSIS — G5601 Carpal tunnel syndrome, right upper limb: Secondary | ICD-10-CM

## 2022-08-17 DIAGNOSIS — M7582 Other shoulder lesions, left shoulder: Secondary | ICD-10-CM

## 2022-08-17 DIAGNOSIS — M5030 Other cervical disc degeneration, unspecified cervical region: Secondary | ICD-10-CM

## 2022-08-17 MED ORDER — CYCLOBENZAPRINE HCL 7.5 MG PO TABS: 7.5000 mg | ORAL_TABLET | Freq: Three times a day (TID) | ORAL | 0 refills | Status: DC | PRN

## 2022-08-17 NOTE — Patient Instructions (Addendum)
Avoid overhead lifting and overhead use of the arms. Do not lift greater than 10-15 lbs. Adjust head rest in vehicle to prevent hyperextension if rear ended. Take extra precautions to avoid falling. Using cane for ambulation Limited in her ability to use the left arm to manipulate her environment due to diffuse numbness and myelopathy that is chronic. Sedentary light work with limits in her ability to use the left arm and hand to manipulate  Her enviroment.  Go to Occupational therapy for work on use of hands and ADLs, also is helpful for [place] to be able to determine the limitations of your ability to work and perform activities that include use of the left arm to manipulate your environment.

## 2022-08-17 NOTE — Progress Notes (Signed)
Office Visit Note   Patient: Monica Huang           Date of Birth: [birth year]           MRN: [id number] Visit Date: 08/17/2022              Requested by: Monica Gibson, MD [place],  [place] [address] PCP: Monica Gibson, MD   Assessment & Plan: Visit Diagnoses:  1. Other spondylosis with myelopathy, cervical region   2. Carpal tunnel syndrome, right upper limb   3. Neuropathy, arm, left   4. Chronic myelopathy ([place])   5. Disc disease, degenerative, cervical   6. Arthritis of left acromioclavicular joint   7. Left shoulder tendonitis     Plan: Avoid overhead lifting and overhead use of the arms. Do not lift greater than 10-15 lbs. Adjust head rest in vehicle to prevent hyperextension if rear ended. Take extra precautions to avoid falling. Using cane for ambulation Limited in her ability to use the left arm to manipulate her environment due to diffuse numbness and myelopathy that is chronic. Sedentary light work with limits in her ability to use the left arm and hand to manipulate  Her enviroment.  Go to Occupational therapy for work on use of hands and ADLs, also is helpful for [place] to be able to determine the limitations of your ability to work and perform activities that include use of the left arm to manipulate your environment.  Follow-Up Instructions: No follow-ups on file.   Orders:  No orders of the defined types were placed in this encounter.  No orders of the defined types were placed in this encounter.     Procedures: No procedures performed   Clinical Data: No additional findings.   Subjective: Chief Complaint  Patient presents with   Neck - Follow-up    41 year old right handed female with history of legal blindness, underwent ACDF 2021 for HNP C5-6 with cord edema. She has had persistent pain left shoulder with left shoulder weakness. Complaints of numbness left arm and hand that is diffuse and non dermatomal. She wants to return to  work and wishes to have restrictions concerning left arm.    Review of Systems  Constitutional: Negative.   HENT: Negative.    Eyes: Negative.   Respiratory: Negative.    Cardiovascular: Negative.   Gastrointestinal: Negative.   Endocrine: Negative.   Genitourinary: Negative.   Musculoskeletal: Negative.   Skin: Negative.   Allergic/Immunologic: Negative.   Neurological: Negative.   Hematological: Negative.   Psychiatric/Behavioral: Negative.       Objective: Vital Signs: BP 121/85 (BP Location: Left Arm, Patient Position: Sitting)   Pulse 88   Ht 5\' 8"  (1.727 m)   Wt (!) 306 lb (138.8 kg)   BMI 46.53 kg/m   Physical Exam Constitutional:      Appearance: She is well-developed.  HENT:     Head: Normocephalic and atraumatic.  Eyes:     Pupils: Pupils are equal, round, and reactive to light.  Pulmonary:     Effort: Pulmonary effort is normal.     Breath sounds: Normal breath sounds.  Abdominal:     General: Bowel sounds are normal.     Palpations: Abdomen is soft.  Musculoskeletal:     Cervical back: Normal range of motion and neck supple.     Lumbar back: Negative right straight leg raise test and negative left straight leg raise test.  Skin:  General: Skin is warm and dry.  Neurological:     Mental Status: She is alert and oriented to person, place, and time.  Psychiatric:        Behavior: Behavior normal.        Thought Content: Thought content normal.        Judgment: Judgment normal.     Back Exam   Tenderness  The patient is experiencing tenderness in the cervical.  Range of Motion  Extension:  abnormal  Flexion:  abnormal  Lateral bend right:  abnormal  Lateral bend left:  abnormal  Rotation right:  abnormal  Rotation left:  abnormal   Muscle Strength  Right Quadriceps:  5/5  Left Quadriceps:  5/5  Right Hamstrings:  5/5  Left Hamstrings:  5/5   Tests  Straight leg raise right: negative Straight leg raise left: negative  Reflexes   Patellar:  2/4 Achilles:  2/4 Biceps:  2/4 Babinski's sign: normal   Other  Toe walk: normal Heel walk: normal Sensation: normal Gait: normal  Erythema: no back redness Scars: present  Comments:  Left shoulder abduction weakness 4/5 Left hand diffuse numbness likely due to myelopathy Right CTS post injection.       Specialty Comments:  No specialty comments available.  Imaging: No results found.   PMFS History: Patient Active Problem List   Diagnosis Date Noted   Herniation of cervical intervertebral disc with radiculopathy 05/25/2020    Priority: High    Class: Chronic   Spondylosis without myelopathy or radiculopathy, cervical region 05/25/2020    Priority: High    Class: Chronic   Thoracic radiculopathy 05/25/2022   Abnormal uterine bleeding 05/11/2022   Screening for cervical cancer 05/11/2022   Urinary urgency 05/11/2022   Low back pain potentially associated with radiculopathy 02/20/2022   Spinal stenosis of cervical region 02/20/2022   Skin lesion of chest wall 06/30/2021   Diabetes mellitus without complication (HCC)    S/P cervical spinal fusion 05/26/2020   Suspected sleep apnea 03/11/2020   Chronic pain of both knees 10/23/2019   Cervical radiculitis 03/10/2019   Prediabetes 02/06/2019   Carpal tunnel syndrome 02/03/2019   Anxiety and depression 06/10/2016   Other optic atrophy, 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   Essential hypertension, benign 05/10/2015   Mechanical complication-ventricular(CSF) communicating shunt (HCC) 03/05/2013   Hidradenitis suppurativa 10/15/2012   Tobacco abuse 01/03/2012   Legally blind 01/03/2012   Obesity 01/03/2012   Marijuana smoker 01/03/2012   Past Medical History:  Diagnosis Date   Ambulates with cane    Chronic back pain    Chronic leg pain    Diabetes mellitus without complication (HCC)    Type II - no meds   Dizziness    Hydradenitis    Hyperlipidemia    diet controlle - no meds   Legally blind    uses cane  - some limited vision   Macular degeneration    patient denies this dx  (dr street dx)   Pneumonia    x 1   Pseudotumor cerebri syndrome 2005   shunt placed- and legally blind   Sciatica    Sleep apnea    "Mild" - has appt on 06/23/20 to see about cpap machine   Wears partial dentures    upper and lower    Family History  Problem Relation Age of Onset   Diabetes Mother    Hypertension Mother    Diabetes Father     Past Surgical History:  Procedure Laterality Date  ANTERIOR CERVICAL DECOMP/DISCECTOMY FUSION N/A 05/25/2020   Procedure: ANTERIOR CERVICAL DISCECTOMY AND FUSION CERVICAL FIVE THROUGH CERVICAL SIX WITH PLATES, SCREWS, ALLOGRAFT AND LOCAL BONE GRAFT, VIVIGEN;  Surgeon: Kerrin Champagne, MD;  Location: [facility] OR;  Service: Orthopedics;  Laterality: N/A;   CSF SHUNT     2 revisions   MULTIPLE EXTRACTIONS WITH ALVEOLOPLASTY Bilateral 11/02/2017   Procedure: MULTIPLE EXTRACTION;  Surgeon: Ocie Doyne, DDS;  Location: [facility] OR;  Service: Oral Surgery;  Laterality: Bilateral;   Social History   Occupational History   Not on file  Tobacco Use   Smoking status: Every Day    Packs/day: 0.50    Years: 21.00    Total pack years: 10.50    Types: Cigarettes   Smokeless tobacco: Never  Vaping Use   Vaping Use: Never used  Substance and Sexual Activity   Alcohol use: Yes    Comment: occasional   Drug use: Yes    Types: Marijuana    Comment: marijuana -- bag per day. - -4 joints/day   Sexual activity: Yes    Birth control/protection: None    Comment: 1 partners

## 2022-09-05 ENCOUNTER — Ambulatory Visit (INDEPENDENT_AMBULATORY_CARE_PROVIDER_SITE_OTHER): Payer: 59 | Admitting: Neurology

## 2022-09-05 ENCOUNTER — Encounter: Payer: Self-pay | Admitting: Neurology

## 2022-09-05 VITALS — BP 152/90 | HR 88 | Ht 68.0 in | Wt 301.0 lb

## 2022-09-05 DIAGNOSIS — R202 Paresthesia of skin: Secondary | ICD-10-CM

## 2022-09-05 MED ORDER — DULOXETINE HCL 60 MG PO CPEP: 60.0000 mg | ORAL_CAPSULE | Freq: Every day | ORAL | 11 refills | Status: DC

## 2022-09-05 NOTE — Progress Notes (Signed)
Chief Complaint  Patient presents with   New Patient (Initial Visit)    Rm 17 alone here for consult on worsening neuropathy. Pt reports prior hx of neck fusion (2021) since procedure lingering numbness/tingling in bilateral arms (left arm is worse). She has tried several meds in the past and worked with PT not much improvement noted with symptoms.       ASSESSMENT AND PLAN  Monica Huang is a 41 y.o. female Bilateral upper extremity paresthesia  History of cervical decompression by Dr. Louanne Skye in 2021  She has persistent neck pain, bilateral hands paresthesia, also complains of wrist pain,  EMG nerve conduction study to rule out bilateral upper extremity focal neuropathy  Repeat CT cervical myelogram in January 2023 showed solid C5-6 ACDF, moderate residual spinal stenosis due to a disc osteophyte complex, ossification of the posterior longitudinal ligament, moderately indenting the ventral spinal cord, patent bilateral neuroforaminal  Add on Cymbalta 60 mg daily  Blind due to pseudotumor cerebri in 2005  DIAGNOSTIC DATA (LABS, IMAGING, TESTING) - I reviewed patient records, labs, notes, testing and imaging myself where available.   MEDICAL HISTORY:  Monica Huang is a 41 year old female, seen in request by orthopedic surgeon Dr. Basil Dess for evaluation of worsening paresthesia, primary care physician is Dr. PCP Jim Like show evaluation September 05, 2022  I reviewed and summarized the referring note. PMHX Pseudotumor cerebri in 2005, lost vision, s/p shunt, was treated at [facility] degeneration,  OSA, using CPAP,   He had a history of rapid progression pseudotumor Cerebri in 2005, lost bilateral vision,  She had a history of cervical decompression surgery by Dr. Louanne Skye in 2021,, prior to surgery, presenting with significant neck pain, radiating pain to bilateral hands, worsening on the left side,  Newly revealed MRI cervical prior to surgery on May 10 2020. Multilevel degenerative change in the cervical spine, most prominent at C5-6 where there is a central disc protrusion causing moderate spinal stenosis and cord hyperintensity consistent with compressive myelopathy.  She lives by herself, needing help with driving, otherwise quite independent, since 2022, she noticed worsening bilateral hands paresthesia, still has chronic neck pain radiating pain to bilateral shoulder, denies gait abnormality, denies bowel bladder incontinence, CT cervical on Dec 20 2021 1. Solid C5-6 ACDF. Moderate residual spinal stenosis due to a disc osteophyte complex and OPLL. 2. Mild-to-moderate spinal stenosis at C6-7. 3. Mild spinal stenosis at C2-3, C3-4, and C4-5.  PHYSICAL EXAM:   Vitals:   09/05/22 1435  BP: (!) 152/90  Pulse: 88  Weight: (!) 301 lb (136.5 kg)  Height: 5\' 8"  (1.727 m)   Not recorded     Body mass index is 45.77 kg/m.  PHYSICAL EXAMNIATION:  Gen: NAD, conversant, well nourised, well groomed                     Cardiovascular: Regular rate rhythm, no peripheral edema, warm, nontender. Eyes: Conjunctivae clear without exudates or hemorrhage Neck: Supple, no carotid bruits. Pulmonary: Clear to auscultation bilaterally   NEUROLOGICAL EXAM:  MENTAL STATUS: Speech/cognition: Awake, alert, oriented to history taking and casual conversation CRANIAL NERVES: CN II: Visual fields are full to confrontation. Pupils are round, very sluggish reactive to light CN III, IV, VI: extraocular movement are normal. No ptosis. CN V: Facial sensation is intact to light touch CN VII: Face is symmetric with normal eye closure  CN VIII: Hearing is normal to causal conversation. CN IX, X: Phonation is  normal. CN XI: Head turning and shoulder shrug are intact  MOTOR: There is no pronator drift of out-stretched arms. Muscle bulk and tone are normal. Muscle strength is normal.  REFLEXES: Reflexes are 2+ and symmetric at the biceps, triceps,  knees, and trace at the ankles. Plantar responses are extensor bilaterally  SENSORY: Intact to light touch, pinprick and vibratory sensation are intact in fingers and toes.  COORDINATION: There is no trunk or limb dysmetria noted.  GAIT/STANCE: Patient is blind, use guiding staff, cautious fairly steady   REVIEW OF SYSTEMS:  Full 14 system review of systems performed and notable only for as above All other review of systems were negative.   ALLERGIES: No Known Allergies  HOME MEDICATIONS: Current Outpatient Medications  Medication Sig Dispense Refill   cyclobenzaprine (FEXMID) 7.5 MG tablet Take 1 tablet (7.5 mg total) by mouth 3 (three) times daily as needed for muscle spasms. 30 tablet 0   ibuprofen (ADVIL) 800 MG tablet TAKE 1 TABLET BY MOUTH EVERY 8 HOURS AS NEEDED FOR MODERATE PAIN. 40 tablet 1   No current facility-administered medications for this visit.    PAST MEDICAL HISTORY: Past Medical History:  Diagnosis Date   Ambulates with cane    Chronic back pain    Chronic leg pain    Diabetes mellitus without complication (HCC)    Type II - no meds   Dizziness    Hydradenitis    Hyperlipidemia    diet controlle - no meds   Legally blind    uses cane - some limited vision   Macular degeneration    patient denies this dx  (dr street dx)   Pneumonia    x 1   Pseudotumor cerebri syndrome 2005   shunt placed- and legally blind   Sciatica    Sleep apnea    "Mild" - has appt on 06/23/20 to see about cpap machine   Wears partial dentures    upper and lower    PAST SURGICAL HISTORY: Past Surgical History:  Procedure Laterality Date   ANTERIOR CERVICAL DECOMP/DISCECTOMY FUSION N/A 05/25/2020   Procedure: ANTERIOR CERVICAL DISCECTOMY AND FUSION CERVICAL FIVE THROUGH CERVICAL SIX WITH PLATES, SCREWS, ALLOGRAFT AND LOCAL BONE GRAFT, VIVIGEN;  Surgeon: Jessy Oto, MD;  Location: [facility];  Service: Orthopedics;  Laterality: N/A;   CSF SHUNT     2 revisions    MULTIPLE EXTRACTIONS WITH ALVEOLOPLASTY Bilateral 11/02/2017   Procedure: MULTIPLE EXTRACTION;  Surgeon: Diona Browner, DDS;  Location: [place];  Service: Oral Surgery;  Laterality: Bilateral;    FAMILY HISTORY: Family History  Problem Relation Age of Onset   Diabetes Mother    Hypertension Mother    Diabetes Father     SOCIAL HISTORY: Social History   Socioeconomic History   Marital status: Single    Spouse name: Not on file   Number of children: Not on file   Years of education: Not on file   Highest education level: Not on file  Occupational History   Not on file  Tobacco Use   Smoking status: Every Day    Packs/day: 0.50    Years: 21.00    Total pack years: 10.50    Types: Cigarettes   Smokeless tobacco: Never  Vaping Use   Vaping Use: Never used  Substance and Sexual Activity   Alcohol use: Yes    Comment: occasional   Drug use: Yes    Types: Marijuana    Comment: marijuana -- bag per day. - -  4 joints/day   Sexual activity: Yes    Birth control/protection: None    Comment: 1 partners  Other Topics Concern   Not on file  Social History Narrative   On disability since 2005 due to vision loss from psuedotumor cerebri.Did not finish highschool-- completed the 9th grade. Walking 2 x week for 11min.    Right handed    Social Determinants of Health   Financial Resource Strain: Not on file  Food Insecurity: Not on file  Transportation Needs: Not on file  Physical Activity: Not on file  Stress: Not on file  Social Connections: Not on file  Intimate Partner Violence: Not on file      Marcial Pacas, M.D. Ph.D.  [facility] [address], [place], [place] [address] Ph: [phone] Fax: [phone]  CC:  Jessy Oto, [place],  [place] [id number]  Eppie Gibson, MD

## 2022-09-06 ENCOUNTER — Other Ambulatory Visit: Payer: Self-pay | Admitting: Specialist

## 2022-09-09 ENCOUNTER — Encounter: Payer: Self-pay | Admitting: Neurology

## 2022-09-25 ENCOUNTER — Encounter: Payer: Self-pay | Admitting: Student

## 2022-09-25 ENCOUNTER — Ambulatory Visit (INDEPENDENT_AMBULATORY_CARE_PROVIDER_SITE_OTHER): Payer: 59 | Admitting: Student

## 2022-09-25 VITALS — BP 141/86 | HR 80 | Wt 299.2 lb

## 2022-09-25 DIAGNOSIS — I10 Essential (primary) hypertension: Secondary | ICD-10-CM

## 2022-09-25 DIAGNOSIS — E119 Type 2 diabetes mellitus without complications: Secondary | ICD-10-CM

## 2022-09-25 DIAGNOSIS — L732 Hidradenitis suppurativa: Secondary | ICD-10-CM

## 2022-09-25 DIAGNOSIS — Z0000 Encounter for general adult medical examination without abnormal findings: Secondary | ICD-10-CM

## 2022-09-25 LAB — POCT GLYCOSYLATED HEMOGLOBIN (HGB A1C): HbA1c, POC (controlled diabetic range): 6.4 % (ref 0.0–7.0)

## 2022-09-25 MED ORDER — DOXYCYCLINE HYCLATE 100 MG PO TABS: 100.0000 mg | ORAL_TABLET | Freq: Two times a day (BID) | ORAL | 0 refills | Status: AC

## 2022-09-25 NOTE — Assessment & Plan Note (Addendum)
-   doxycyline 100 mg BID for 10 days sent to pharmacy  - dermatology referral sent for biologic therapy

## 2022-09-25 NOTE — Patient Instructions (Addendum)
It was great to see you today! Thank you for choosing [facility] for your primary care.   Today we addressed: HS - I am sending in doxycycline and a referral to dermatology.  I sent in the order for your mammogram  I am checking several labs today, I will call you and let you know your results   If you haven't already, sign up for My Chart to have easy access to your labs results, and communication with your primary care physician.  We are checking some labs today. If they are abnormal, I will call you. If they are normal, I will send you a MyChart message (if it is active) or a letter in the mail. If you do not hear about your labs in the next 2 weeks, please call the office.   You should return to our clinic Return in about 3 months (around 12/26/2022).  I recommend that you always bring your medications to each appointment as this makes it easy to ensure you are on the correct medications and helps [place] not miss refills when you need them.  Please arrive 15 minutes before your appointment to ensure smooth check in process.  We appreciate your efforts in making this happen.  Please call the clinic at [phone] if your symptoms worsen or you have any concerns.  Thank you for allowing me to participate in your care, Dr. Sabra Heck

## 2022-09-25 NOTE — Assessment & Plan Note (Signed)
Mammogram ordered by shared decision making  - declined flu and prevnar 20 vaccines

## 2022-09-25 NOTE — Assessment & Plan Note (Addendum)
A1C 6.4, continue diet controlled therapy for T2DM Check urine microalbumin, BMP, and lipid panel  - follow up in 3 months

## 2022-09-25 NOTE — Progress Notes (Signed)
    SUBJECTIVE:   CHIEF COMPLAINT / HPI:   Monica Huang is a 41 y.o. female  presenting for annual visit for antibiotics for her HS and mammogram.   HS: She has recently had a HS flare in her inguinal to inner thighs. She reports this is an ongoing issue that is very painful. She has seen a dermatologist in the past for this issue but was a loss to follow up.   T2DM:  Meds: None, diet controlled  Eye Exam: legally blind, follows with ophthalmology  Last A1C: 6.6  PERTINENT  PMH / PSH: HTN, sleep apnea, DM, pseudotumor cerebri, sciatic, HS, legally blind, CSF communicating shunt  OBJECTIVE:   BP (!) 141/86   Pulse 80   Wt 299 lb 3.2 oz (135.7 kg)   SpO2 100%   BMI 45.49 kg/m   Ambulates with cane  Chronically ill-appearing, no acute distress Cardio: Regular rate, regular rhythm, no murmurs on exam. Pulm: Clear, no wheezing, no crackles. No increased work of breathing Abdominal: bowel sounds present, soft, non-tender, non-distended Extremities: no peripheral edema   Skin: multiple sores and cysts on her inner thighs, with clear drainage.   ASSESSMENT/PLAN:   Hidradenitis suppurativa - doxycyline 100 mg BID for 10 days sent to pharmacy  - dermatology referral sent for biologic therapy   Diabetes mellitus without complication ([place]) H8E 6.4, continue diet controlled therapy for T2DM Check urine microalbumin, BMP, and lipid panel  - follow up in 3 months   Healthcare maintenance Mammogram ordered by shared decision making  - declined flu and prevnar 20 vaccines     Monica Huang, Monica Huang

## 2022-09-26 LAB — LIPID PANEL
Chol/HDL Ratio: 3.7 ratio (ref 0.0–4.4)
Cholesterol, Total: 181 mg/dL (ref 100–199)
HDL: 49 mg/dL (ref >39)
LDL Chol Calc (NIH): 105 mg/dL — ABNORMAL HIGH (ref 0–99)
Triglycerides: 155 mg/dL — ABNORMAL HIGH (ref 0–149)
VLDL Cholesterol Cal: 27 mg/dL (ref 5–40)

## 2022-09-26 LAB — BASIC METABOLIC PANEL
BUN/Creatinine Ratio: 15 (ref 9–23)
BUN: 12 mg/dL (ref 6–24)
CO2: 22 mmol/L (ref 20–29)
Calcium: 9.3 mg/dL (ref 8.7–10.2)
Chloride: 100 mmol/L (ref 96–106)
Creatinine, Ser: 0.82 mg/dL (ref 0.57–1.00)
Glucose: 124 mg/dL — ABNORMAL HIGH (ref 70–99)
Potassium: 4.3 mmol/L (ref 3.5–5.2)
Sodium: 140 mmol/L (ref 134–144)
eGFR: 93 mL/min/{1.73_m2} (ref >59)

## 2022-09-26 LAB — MICROALBUMIN / CREATININE URINE RATIO
Creatinine, Urine: 234.2 mg/dL
Microalb/Creat Ratio: 2 mg/g creat (ref 0–29)
Microalbumin, Urine: 5.1 ug/mL

## 2022-09-27 ENCOUNTER — Telehealth: Payer: Self-pay | Admitting: Student

## 2022-09-27 NOTE — Telephone Encounter (Signed)
Called patient and discussed recent lab results.  Her cholesterol increased moderately from previous lipid panel but she is not high risk for stroke.  ASCVD risk score 2.4%.  She is not a candidate for statin currently.  Patient in agreement with plan and understands lab results.  Darci Current, DO [facility], PGY-1 09/27/22 9:09 AM

## 2022-09-30 ENCOUNTER — Other Ambulatory Visit: Payer: Self-pay | Admitting: Neurology

## 2022-10-05 ENCOUNTER — Ambulatory Visit (INDEPENDENT_AMBULATORY_CARE_PROVIDER_SITE_OTHER): Payer: 59 | Admitting: Surgical

## 2022-10-05 ENCOUNTER — Encounter: Payer: Self-pay | Admitting: Orthopedic Surgery

## 2022-10-05 DIAGNOSIS — M4802 Spinal stenosis, cervical region: Secondary | ICD-10-CM

## 2022-10-05 DIAGNOSIS — M542 Cervicalgia: Secondary | ICD-10-CM | POA: Diagnosis not present

## 2022-10-05 NOTE — Progress Notes (Signed)
Office Visit Note   Patient: Monica Huang           Date of Birth: 16-Jul-1981           MRN: [id number] Visit Date: 10/05/2022 Requested by: Alicia Amel, MD [address] [place],  [place] [address] PCP: Alicia Amel, MD  Subjective: Chief Complaint  Patient presents with   Right Shoulder - Pain   Right Hand - Pain   Left Hand - Pain    HPI: Monica Huang is a 41 y.o. female who presents to the office reporting right shoulder and neck pain as well as bilateral hand numbness and tingling.  Patient states that she has had pain for over a year.  No known injury.  She localizes pain to the right trapezius and clavicle region with radiation into the shoulder blade and down the right arm into the right hand.  She has associated numbness and tingling of all 5 fingers of the right hand as well as the left hand but more so the right.  She has history of prior ACDF at C5-C6 by Dr. Otelia Sergeant in June 2021.  She has fairly recent CT myelogram of the cervical spine that was done in January 2023 demonstrating solid fusion at C5-C6 with moderate spinal stenosis at C6-C7.  She sees Dr. Sherrell Puller of [facility] neurology.  She has been started on Cymbalta but she feels this is not really making much of a difference.  No history of prior cervical spine ESI but she has had carpal tunnel injection by Dr. Frazier Butt in the right hand that did not really provide any relief and made her pain worse according to her.  No history of diabetes, blood thinner use, hypertension according to her.  She does smoke.              ROS: All systems reviewed are negative as they relate to the chief complaint within the history of present illness.  Patient denies fevers or chills.  Assessment & Plan: Visit Diagnoses:  1. Spinal stenosis of cervical region   2. Cervicalgia     Plan: Patient is a 41 year old female who presents for evaluation of right shoulder/neck pain and radicular pain down both arms with numbness and tingling  both hands.  She has pain ongoing for over a year.  History of ACDF at C5-C6 by Dr. Otelia Sergeant in June 2021.  She has been seeing Dr. Otelia Sergeant but it is unclear by his notes if the weakness that she is demonstrating on exam today is new or chronic for her.  With her continued pain and the fairly recent CT myelogram demonstrating moderate spinal stenosis at C6-C7, plan for further evaluation of potential progression of stenosis with MRI cervical spine.  Patient agreed with this plan.  Did offer to set her up with a diagnostic/therapeutic cervical spine ESI but she does not want to go ahead with this at this time.  Follow-up after MRI to review results.  Follow-Up Instructions: No follow-ups on file.   Orders:  Orders Placed This Encounter  Procedures   MR Cervical Spine w/o contrast   No orders of the defined types were placed in this encounter.     Procedures: No procedures performed   Clinical Data: No additional findings.  Objective: Vital Signs: There were no vitals taken for this visit.  Physical Exam:  Constitutional: Patient appears well-developed HEENT:  Head: Normocephalic Eyes:EOM are normal Neck: Normal range of motion Cardiovascular: Normal rate Pulmonary/chest: Effort normal  Neurologic: Patient is alert Skin: Skin is warm Psychiatric: Patient has normal mood and affect  Ortho Exam: Ortho exam demonstrates mild tenderness over the cervical spine around the level of C7.  She has positive Lhermitte sign.  Negative Spurling sign.  She has 4/5 motor strength of finger abduction, FPL, supination on the right relative to 5/5 on the left.  She has 5 -/5 tricep strength on the right relative to 5/5 on the left.  She has 5/5 motor strength of bilateral grip strength, EPL, pronation, bicep flexion.  5/5 motor strength of the deltoid, supraspinatus, infraspinatus, subscapularis.  She has no significant reproduction of pain with stressing the right-sided rotator cuff tendons.  No tenderness  over the bicep tendon.  No tenderness over the [facility] joint.  Range of motion of the right shoulder demonstrates 30 degrees X rotation, 90 degrees abduction, 150 degrees forward flexion passively.  Specialty Comments:  No specialty comments available.  Imaging: No results found.   PMFS History: Patient Active Problem List   Diagnosis Date Noted   Healthcare maintenance 09/25/2022   Thoracic radiculopathy 05/25/2022   Abnormal uterine bleeding 05/11/2022   Urinary urgency 05/11/2022   Low back pain potentially associated with radiculopathy 02/20/2022   Diabetes mellitus without complication ([place])    S/P cervical spinal fusion 05/26/2020   Herniation of cervical intervertebral disc with radiculopathy 05/25/2020    Class: Chronic   Chronic pain of both knees 10/23/2019   Carpal tunnel syndrome 02/03/2019   Anxiety and depression 06/10/2016   Essential hypertension, benign 05/10/2015   Mechanical complication-ventricular(CSF) communicating shunt ([place]) 03/05/2013   Hidradenitis suppurativa 10/15/2012   Tobacco abuse 01/03/2012   Legally blind 01/03/2012   Obesity 01/03/2012   Marijuana smoker 01/03/2012   Past Medical History:  Diagnosis Date   Ambulates with cane    Chronic back pain    Chronic leg pain    Diabetes mellitus without complication (HCC)    Type II - no meds   Hyperlipidemia    diet controlle - no meds   Legally blind    uses cane - some limited vision   Macular degeneration    patient denies this dx  (dr street dx)   Pseudotumor cerebri syndrome 2005   shunt placed- and legally blind   Sciatica    Sleep apnea    "Mild" - has appt on 06/23/20 to see about cpap machine   Wears partial dentures    upper and lower    Family History  Problem Relation Age of Onset   Diabetes Mother    Hypertension Mother    Diabetes Father     Past Surgical History:  Procedure Laterality Date   ANTERIOR CERVICAL DECOMP/DISCECTOMY FUSION N/A 05/25/2020   Procedure: ANTERIOR  CERVICAL DISCECTOMY AND FUSION CERVICAL FIVE THROUGH CERVICAL SIX WITH PLATES, SCREWS, ALLOGRAFT AND LOCAL BONE GRAFT, VIVIGEN;  Surgeon: Jessy Oto, MD;  Location: [facility];  Service: Orthopedics;  Laterality: N/A;   CSF SHUNT     2 revisions   MULTIPLE EXTRACTIONS WITH ALVEOLOPLASTY Bilateral 11/02/2017   Procedure: MULTIPLE EXTRACTION;  Surgeon: Diona Browner, DDS;  Location: [facility];  Service: Oral Surgery;  Laterality: Bilateral;   Social History   Occupational History   Not on file  Tobacco Use   Smoking status: Every Day    Packs/day: 0.50    Years: 21.00    Total pack years: 10.50    Types: Cigarettes   Smokeless tobacco: Never  Vaping Use  Vaping Use: Never used  Substance and Sexual Activity   Alcohol use: Yes    Comment: occasional   Drug use: Yes    Types: Marijuana    Comment: marijuana -- bag per day. - -4 joints/day   Sexual activity: Yes    Birth control/protection: None    Comment: 1 partners

## 2022-10-18 ENCOUNTER — Other Ambulatory Visit: Payer: Self-pay | Admitting: Family Medicine

## 2022-10-18 DIAGNOSIS — L732 Hidradenitis suppurativa: Secondary | ICD-10-CM

## 2022-10-23 ENCOUNTER — Telehealth: Payer: Self-pay

## 2022-10-23 NOTE — Telephone Encounter (Signed)
Patient calls nurse line regarding issues with Hidradenitis flare. She was prescribed doxycycline at visit on 10/30, however, reports no improvement. She states that in the past clindamycin 300 mg capsules has worked best for her for this issue. She is requesting that this prescription be refilled and sent to the [facility] on [address].   Please advise.   Veronda Prude, RN

## 2022-10-24 ENCOUNTER — Other Ambulatory Visit: Payer: Self-pay | Admitting: Surgical

## 2022-10-24 ENCOUNTER — Telehealth: Payer: Self-pay | Admitting: Surgical

## 2022-10-24 MED ORDER — IBUPROFEN 800 MG PO TABS: ORAL_TABLET | ORAL | 1 refills | Status: DC

## 2022-10-24 NOTE — Telephone Encounter (Signed)
Sent in

## 2022-10-24 NOTE — Telephone Encounter (Signed)
Patient would like her Ibuprofen 800 refilled [facility] [address]

## 2022-10-26 ENCOUNTER — Telehealth: Payer: Self-pay | Admitting: Neurology

## 2022-10-26 ENCOUNTER — Telehealth: Payer: Self-pay | Admitting: Orthopedic Surgery

## 2022-10-26 NOTE — Telephone Encounter (Signed)
Called Pt. Informed her that  her swelling was not mentioned at her last visit and this may be a new issue. She said this is not a new issue and I will not be coming back and hung up on me.

## 2022-10-26 NOTE — Telephone Encounter (Signed)
I reviewed chart, we originally evaluated the pt on 09/05/2022 for paresthesia.   We will not be able to provide a work letter for today, swelling was not mentioned at the time of her visit on 09/05/22 possibly a new issue.  Please have her reach out to primary care or urgent care for eval and work note.

## 2022-10-26 NOTE — Telephone Encounter (Signed)
Patient called advised her hands are really swollen and she could not go to work today. Patient asked if she could get a note for staying home today? The number to contact patient is [phone]

## 2022-10-26 NOTE — Telephone Encounter (Signed)
Pt is calling. Stated she needs a doctors note for worked. Stated her hands are swollen and couldn't go to work today. Pt is requesting a call-back from nurse.

## 2022-10-28 ENCOUNTER — Inpatient Hospital Stay: Admission: RE | Admit: 2022-10-28 | Payer: 59 | Source: Ambulatory Visit

## 2022-10-30 ENCOUNTER — Telehealth: Payer: Self-pay | Admitting: Orthopedic Surgery

## 2022-10-30 NOTE — Telephone Encounter (Signed)
I spoke with patient. Note actually needs to be for 10/26/2022.  I advised this fax number does not work. She asked me to try [phone]. Note faxed.

## 2022-10-30 NOTE — Telephone Encounter (Signed)
Patient wants you to fax letter to this number instead of leaving at the front. [phone]

## 2022-10-30 NOTE — Telephone Encounter (Signed)
She's recently been seen for her hands and neck pain with radicular symptoms so I am okay with a onetime note since it hasn't been long since her visit

## 2022-10-30 NOTE — Telephone Encounter (Signed)
I left voicemail for patient advising note entered and placed at front for pick up. Also available through My Chart.

## 2022-11-02 ENCOUNTER — Ambulatory Visit: Payer: 59 | Admitting: Orthopedic Surgery

## 2022-11-08 ENCOUNTER — Ambulatory Visit (INDEPENDENT_AMBULATORY_CARE_PROVIDER_SITE_OTHER): Payer: 59 | Admitting: Student

## 2022-11-08 VITALS — BP 122/74 | HR 87 | Ht 68.0 in | Wt 299.0 lb

## 2022-11-08 DIAGNOSIS — B349 Viral infection, unspecified: Secondary | ICD-10-CM | POA: Diagnosis not present

## 2022-11-08 DIAGNOSIS — R051 Acute cough: Secondary | ICD-10-CM | POA: Diagnosis not present

## 2022-11-08 MED ORDER — NICOTINE 14 MG/24HR TD PT24: 14.0000 mg | MEDICATED_PATCH | Freq: Every day | TRANSDERMAL | 0 refills | Status: DC

## 2022-11-08 NOTE — Progress Notes (Signed)
    SUBJECTIVE:   CHIEF COMPLAINT / HPI:   Monica Huang is a 41 year-old female here for cough, congestion, diarrhea.  She started feeling unwell yesterday with viral URI symptoms of congestion and feeling stuffy. Then she developed cough and diarrhea. Has been able to tolerate p.o. fluids.  Denies any vomiting.  Is having a little bit of nausea.  Requesting work note to stay home from work.   PERTINENT  PMH / PSH: Reviewed  OBJECTIVE:   BP 122/74   Pulse 87   Ht 5\' 8"  (1.727 m)   Wt 299 lb (135.6 kg)   LMP 10/16/2022 (Approximate)   SpO2 100%   BMI 45.46 kg/m   General: Alert and cooperative and appears to be in no acute distress HEENT: No conjunctival injection.  Mild tonsillar erythema.  Nasal congestion. Cardio: Normal S1 and S2, no S3 or S4. Rhythm is regular. No murmurs or rubs.   Pulm: Clear to auscultation bilaterally, no crackles, wheezing, or diminished breath sounds. Normal respiratory effort Abdomen: Bowel sounds normal. Abdomen soft and non-tender.  Extremities: No peripheral edema. Warm/ well perfused.  Strong radial pulses. Neuro: Cranial nerves grossly intact   ASSESSMENT/PLAN:   Viral illness Patient with 2-day history of viral URI symptoms and diarrhea. No red flag signs or symptoms.  Tired but nontoxic-appearing.  Vital signs stable.  Normal work of breathing on room air. Provided nicotine patch as she has been able to smoke cigarettes.  Encouraged her to use this as an opportunity for complete tobacco cessation. Obtain COVID/flu testing today, especially given that she has high risk factors.  Will follow-up on these. Encouraged supportive care with fluids, rest, honey for cough. Work note provided. Return precautions discussed     10/18/2022, DO [facility]

## 2022-11-08 NOTE — Patient Instructions (Addendum)
It was great seeing you today.  I am sorry you are not feeling well. You likely have a virus, whether this is the flu or COVID we will know based on your testing.  I will call you with the results. In the meantime, stay at home away from other people and wash your hands frequently.  For your cough, continue using honey and drink plenty of fluids (warm tea, water, Gatorade zero). As we discussed, if your symptoms worsen in a week or do not improve, please let us know or come back and see [place] as you may be developing a bacterial infection that requires antibiotic therapy.   If you have any questions or concerns, please feel free to call the clinic.   Have a wonderful day,  Dr. Darral Dash [facility] [phone]

## 2022-11-09 ENCOUNTER — Telehealth: Payer: Self-pay

## 2022-11-09 DIAGNOSIS — B349 Viral infection, unspecified: Secondary | ICD-10-CM | POA: Insufficient documentation

## 2022-11-09 NOTE — Assessment & Plan Note (Signed)
Patient with 2-day history of viral URI symptoms and diarrhea. No red flag signs or symptoms.  Tired but nontoxic-appearing.  Vital signs stable.  Normal work of breathing on room air. Provided nicotine patch as she has been able to smoke cigarettes.  Encouraged her to use this as an opportunity for complete tobacco cessation. Obtain COVID/flu testing today, especially given that she has high risk factors.  Will follow-up on these. Encouraged supportive care with fluids, rest, honey for cough. Work note provided. Return precautions discussed

## 2022-11-09 NOTE — Telephone Encounter (Signed)
Patient calls nurse line requesting a prescription for a decongestant.   Patient reports she was seen yesterday for symptoms. She is requesting results from swab. Advised patient the test is not back yet.   Patient reports she has been using Mucinex without relief.   She denies any worsening symptoms. No SOB or fevers.   Will forward to provider who saw patient.

## 2022-11-10 ENCOUNTER — Telehealth: Payer: Self-pay | Admitting: Student

## 2022-11-10 ENCOUNTER — Encounter: Payer: Self-pay | Admitting: Student

## 2022-11-10 ENCOUNTER — Telehealth: Payer: Self-pay

## 2022-11-10 LAB — COVID-19, FLU A+B NAA
Influenza A, NAA: DETECTED — AB
Influenza B, NAA: NOT DETECTED
SARS-CoV-2, NAA: NOT DETECTED

## 2022-11-10 MED ORDER — GUAIFENESIN ER 600 MG PO TB12: 600.0000 mg | ORAL_TABLET | Freq: Two times a day (BID) | ORAL | 0 refills | Status: DC | PRN

## 2022-11-10 MED ORDER — BENZONATATE 100 MG PO CAPS: 100.0000 mg | ORAL_CAPSULE | Freq: Two times a day (BID) | ORAL | 0 refills | Status: DC | PRN

## 2022-11-10 NOTE — Telephone Encounter (Signed)
Tried to call patient to advise her of positive influenza results. Will send MyChart message and also try again later.

## 2022-11-10 NOTE — Telephone Encounter (Signed)
Pt is returning call. Requesting call back.  

## 2022-11-10 NOTE — Telephone Encounter (Signed)
Called patient and LVM that letter has been printed and placed at front desk.   Veronda Prude, RN

## 2022-11-10 NOTE — Telephone Encounter (Signed)
Patient calls nurse line. Advised that she can take Sudafed OTC for congestion.   Veronda Prude, RN

## 2022-11-10 NOTE — Telephone Encounter (Signed)
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  She states that she is continuing to have productive cough and does not feel up to going to work today. She is requesting letter excusing her for today and allowing return to work on Monday.   Will forward to Dr. Melissa Noon.   Veronda Prude, RN

## 2022-11-10 NOTE — Addendum Note (Signed)
Addended by: Darral Dash on: 11/10/2022 11:05 AM   Modules accepted: Orders

## 2022-11-13 ENCOUNTER — Telehealth: Payer: Self-pay

## 2022-11-13 MED ORDER — ONDANSETRON HCL 4 MG PO TABS: 4.0000 mg | ORAL_TABLET | Freq: Three times a day (TID) | ORAL | 0 refills | Status: DC | PRN

## 2022-11-13 NOTE — Telephone Encounter (Signed)
Patient advised of medication to the pharmacy.   She is requesting a work note to return to tomorrow.  I asked her if she has been fever free for 24 hours. She reports last fever was at 8am yesterday morning of 101.0.  Will forward to provider.

## 2022-11-13 NOTE — Telephone Encounter (Signed)
Patient calls nurse line requesting a medication for nausea.   She reports she is feeling a lot better, however has been having episodes of nausea and diarrhea. She reports she is staying hydrated. She would like to return to back to work "soon," however would like Zofran to help.   She denies any recents fevers or vomiting.   Will forward to provider who saw patient.

## 2022-11-14 ENCOUNTER — Encounter: Payer: Self-pay | Admitting: Student

## 2022-11-17 ENCOUNTER — Ambulatory Visit: Payer: 59

## 2022-11-22 ENCOUNTER — Other Ambulatory Visit: Payer: 59

## 2022-12-03 ENCOUNTER — Other Ambulatory Visit: Payer: Self-pay | Admitting: Surgical

## 2022-12-08 ENCOUNTER — Telehealth: Payer: Self-pay | Admitting: Orthopedic Surgery

## 2022-12-08 NOTE — Telephone Encounter (Signed)
Patient need someone to call she has some questions about her MRI. Please advise..[phone] 

## 2022-12-08 NOTE — Telephone Encounter (Signed)
Patient need someone to call she has some questions about her MRI. Please advise..[phone]

## 2022-12-13 NOTE — Telephone Encounter (Signed)
Tried calling several times no answer

## 2022-12-20 ENCOUNTER — Encounter: Payer: 59 | Admitting: Neurology

## 2023-01-09 ENCOUNTER — Ambulatory Visit
Admission: RE | Admit: 2023-01-09 | Discharge: 2023-01-09 | Disposition: A | Discharge status: Home / Self Care | Payer: 59 | Source: Ambulatory Visit | Attending: Orthopedic Surgery | Admitting: Orthopedic Surgery

## 2023-01-09 DIAGNOSIS — M542 Cervicalgia: Secondary | ICD-10-CM

## 2023-01-11 ENCOUNTER — Ambulatory Visit
Admission: RE | Admit: 2023-01-11 | Discharge: 2023-01-11 | Disposition: A | Discharge status: Home / Self Care | Payer: 59 | Source: Ambulatory Visit | Attending: Family Medicine | Admitting: Family Medicine

## 2023-01-11 DIAGNOSIS — Z0000 Encounter for general adult medical examination without abnormal findings: Secondary | ICD-10-CM

## 2023-01-12 NOTE — Progress Notes (Signed)
R c67 adjacent segment dz - does not want shots   - h/o c56 acdf pls rf to [facility] thx

## 2023-01-15 ENCOUNTER — Other Ambulatory Visit: Payer: Self-pay

## 2023-01-15 DIAGNOSIS — M542 Cervicalgia: Secondary | ICD-10-CM

## 2023-01-25 ENCOUNTER — Other Ambulatory Visit: Payer: Self-pay | Admitting: Surgical

## 2023-01-25 ENCOUNTER — Other Ambulatory Visit: Payer: Self-pay | Admitting: Student

## 2023-01-25 DIAGNOSIS — L732 Hidradenitis suppurativa: Secondary | ICD-10-CM

## 2023-01-31 ENCOUNTER — Ambulatory Visit (INDEPENDENT_AMBULATORY_CARE_PROVIDER_SITE_OTHER): Payer: 59 | Admitting: Orthopedic Surgery

## 2023-01-31 ENCOUNTER — Ambulatory Visit (INDEPENDENT_AMBULATORY_CARE_PROVIDER_SITE_OTHER): Payer: 59

## 2023-01-31 ENCOUNTER — Encounter: Payer: Self-pay | Admitting: Orthopedic Surgery

## 2023-01-31 VITALS — BP 129/87 | HR 91 | Ht 68.0 in | Wt 299.0 lb

## 2023-01-31 DIAGNOSIS — M4802 Spinal stenosis, cervical region: Secondary | ICD-10-CM

## 2023-01-31 DIAGNOSIS — G5602 Carpal tunnel syndrome, left upper limb: Secondary | ICD-10-CM

## 2023-01-31 DIAGNOSIS — G5601 Carpal tunnel syndrome, right upper limb: Secondary | ICD-10-CM

## 2023-01-31 DIAGNOSIS — M542 Cervicalgia: Secondary | ICD-10-CM

## 2023-01-31 NOTE — Progress Notes (Signed)
Orthopedic Spine Surgery Office Note  Assessment: Patient is a 42 y.o. female with neck pain that radiates into her right upper extremity in a C7 distribution.  Also has hand clumsiness due to numbness and tingling in the bilateral hands.  Has positive findings of carpal tunnel syndrome.  No imbalance and no physical exam findings of myelopathy   Plan: -I explained the patient that she does have stenosis in her cervical spine and symptoms of hand clumsiness.  However, she has numbness and tingling and positive physical exam findings of carpal tunnel syndrome.  She has no imbalance and no physical exam findings of myelopathy, so I told her I am not sure that her symptoms are from myelopathy.  She did get relief with the prior cortisone injection to her carpal tunnel so I advised her to get carpal tunnel releases and then we can reevaluate to see what symptoms she has.  I would also recheck her physical exam to see if she develops any physical exam findings of myelopathy.  She agrees with this plan because she did not want undergo further cervical spine surgery.  I did inform her that the carpal tunnel release would not help with her radiating pain down her right arm.  She expressed understanding. -If we ever consider surgery for radiculopathy, she would need to lose weight and quit nicotine containing products.  However, if she develops more signs or symptoms of myelopathy, then would forego that requirement to prevent further progression of her functional loss -Patient should return to office in 8 weeks, x-rays at next visit: None   Patient expressed understanding of the plan and all questions were answered to the patient's satisfaction.   ___________________________________________________________________________   History:  Patient is a 42 y.o. female who presents today for cervical spine.  Patient has previously undergone C5/6 ACDF with Dr. Louanne Skye.  She says she got good relief of her left upper  extremity pain after that surgery.  She was still left with numbness and tingling in her left hand.  Within the last 6 months, she has noticed onset of right upper extremity pain.  It starts in her neck and radiates down into her shoulder, arm, dorsal forearm and into the middle of her hand.  She also gets numbness and tingling in the left hand and all fingers.  She does report a history of hand clumsiness.  She says she has decreased sensation in her hands and that causes her to have difficulty opening jars and she does drop objects because she does not have normal sensation in her hands.  She has been previously diagnosed with carpal tunnel syndrome and has gotten injections which did provide her with good relief.  She has no issues with balance.   Weakness: denies Difficulty with fine motor skills (e.g., buttoning shirts, handwriting): yes Symptoms of imbalance: denies Paresthesias and numbness: yes, in bilateral hands. No other numbness or paresthesias Bowel or bladder incontinence: Denies Saddle anesthesia: Denies  Treatments tried: Physical therapy, steroid injection, Tylenol, Cymbalta  Review of systems: Denies fevers and chills, night sweats, unexplained weight loss, history of cancer.  Has had pain that wakes her at night  Past medical history: Depression/anxiety Sleep apnea Morbid obesity Legally blind Pseudotumor cerebri Diabetes (A1c of 6.4 on 09/25/2022)  Allergies: NKDA  Past surgical history:  C5/6 ACDF CSF shunt Multiple tooth extraction with alveoloplasty  Social history: Reports use of nicotine product (smoking, vaping, patches, smokeless) Alcohol use: Denies Denies recreational drug use  Physical Exam:  General: no acute distress, appears stated age Neurologic: alert, answering questions appropriately, following commands Respiratory: unlabored breathing on room air, symmetric chest rise Psychiatric: appropriate affect, normal cadence to speech   MSK  (spine):  -Strength exam      Left  Right Grip strength                5/5  5/5 Interosseus   5/5   5/5 Wrist extension  5/5  5/5 Wrist flexion   5/5  5/5 Elbow flexion   5/5  5/5 Deltoid    5/5  5/5  EHL    5/5  5/5 TA    5/5  5/5 GSC    5/5  5/5 Knee extension  5/5  5/5 Hip flexion   5/5  5/5  -Sensory exam    Sensation intact to light touch in L3-S1 nerve distributions of bilateral lower extremities  Sensation intact to light touch in C5-T1 nerve distributions of bilateral upper extremities  -Brachioradialis DTR: 2/4 on the left, 2/4 on the right -Biceps DTR: 2/4 on the left, 2/4 on the right -Achilles DTR: 1/4 on the left, 1/4 on the right -Patellar tendon DTR: 2/4 on the left, 2/4 on the right  -Spurling: Negative bilaterally -Hoffman sign: Negative bilaterally -Clonus: No beats bilaterally -Interosseous wasting: None seen -Grip and release test: Negative -Romberg: Negative -Gait: Normal with walking stick  Left shoulder exam: No pain through range of motion Right shoulder exam: No pain through range of motion, negative Jobe, negative belly press, no weakness with external rotation with arm at side  Tinel's at wrist: Positive on the right, negative on the left Phalen's at wrist: Negative bilaterally Durkan's: Positive bilaterally  Tinel's at elbow: Negative bilaterally  Imaging: XR of the cervical spine from 01/31/2023 was independently reviewed and interpreted, showing anterior instrumentation at C5 and C6.  Screws are not backed out.  No lucency around the screws.  There appears to be fusion mass across the former disc space at C5-6.  Neutral alignment.  No fracture or dislocation.  MRI of the cervical spine from 01/09/2023 was independently reviewed and interpreted, showing foraminal stenosis on the left at C3-4, central stenosis posterior to C5 and C6, T2 cord signal change behind the former C5-6 disc space, right-sided paracentral disc herniation at C6-7,  bilateral foraminal stenosis at C6/7.   Patient name: Monica Huang Patient MRN: [id number] Date of visit: 01/31/23

## 2023-02-07 ENCOUNTER — Telehealth: Payer: Self-pay | Admitting: Physical Medicine and Rehabilitation

## 2023-02-07 NOTE — Telephone Encounter (Signed)
Received call from [place] @ [facility]. Requesting EMG/NCS report from 11/30/2021, patient there now. I Faxed to fax number given [phone], ph [phone]

## 2023-03-10 ENCOUNTER — Other Ambulatory Visit: Payer: Self-pay | Admitting: Surgical

## 2023-03-28 ENCOUNTER — Ambulatory Visit: Payer: 59 | Admitting: Orthopedic Surgery

## 2023-03-28 ENCOUNTER — Telehealth: Payer: Self-pay | Admitting: Cardiovascular Disease

## 2023-03-28 ENCOUNTER — Ambulatory Visit: Payer: 59 | Admitting: Cardiovascular Disease

## 2023-03-28 DIAGNOSIS — G4733 Obstructive sleep apnea (adult) (pediatric): Secondary | ICD-10-CM

## 2023-03-28 DIAGNOSIS — I10 Essential (primary) hypertension: Secondary | ICD-10-CM

## 2023-03-28 NOTE — Telephone Encounter (Signed)
What problem are you experiencing? Patient needs new supplies. She saw Dr. Tresa Endo in May of 2023 and was told to follow up in 2 years. She had an appt today but needed to cancel. She wants to know if a new prescription can be sent in for since she is technically not due until May of 2026. Please advise.   Who is your medical equipment company? N/A   Please route to the sleep study assistant.

## 2023-05-03 ENCOUNTER — Other Ambulatory Visit: Payer: Self-pay | Admitting: Surgical

## 2023-05-27 ENCOUNTER — Other Ambulatory Visit: Payer: Self-pay | Admitting: Student

## 2023-05-27 DIAGNOSIS — L732 Hidradenitis suppurativa: Secondary | ICD-10-CM

## 2023-05-30 NOTE — Telephone Encounter (Signed)
New supplies ordered through a new DME [facility]. Order placed via community message.

## 2023-06-04 ENCOUNTER — Ambulatory Visit: Payer: 59 | Admitting: Student

## 2023-06-15 ENCOUNTER — Encounter: Payer: Self-pay | Admitting: Student

## 2023-06-15 ENCOUNTER — Other Ambulatory Visit: Payer: Self-pay

## 2023-06-15 ENCOUNTER — Ambulatory Visit (INDEPENDENT_AMBULATORY_CARE_PROVIDER_SITE_OTHER): Payer: 59 | Admitting: Student

## 2023-06-15 VITALS — BP 115/74 | HR 104 | Ht 68.0 in | Wt 298.4 lb

## 2023-06-15 DIAGNOSIS — L301 Dyshidrosis [pompholyx]: Secondary | ICD-10-CM

## 2023-06-15 DIAGNOSIS — Z720 Tobacco use: Secondary | ICD-10-CM

## 2023-06-15 DIAGNOSIS — E119 Type 2 diabetes mellitus without complications: Secondary | ICD-10-CM | POA: Diagnosis not present

## 2023-06-15 DIAGNOSIS — Z1231 Encounter for screening mammogram for malignant neoplasm of breast: Secondary | ICD-10-CM

## 2023-06-15 DIAGNOSIS — Z0000 Encounter for general adult medical examination without abnormal findings: Secondary | ICD-10-CM | POA: Diagnosis not present

## 2023-06-15 DIAGNOSIS — I10 Essential (primary) hypertension: Secondary | ICD-10-CM

## 2023-06-15 DIAGNOSIS — Z791 Long term (current) use of non-steroidal anti-inflammatories (NSAID): Secondary | ICD-10-CM

## 2023-06-15 LAB — POCT GLYCOSYLATED HEMOGLOBIN (HGB A1C): HbA1c, POC (controlled diabetic range): 7.3 % — AB (ref 0.0–7.0)

## 2023-06-15 MED ORDER — FLUOCINONIDE 0.05 % EX OINT: 1.0000 | TOPICAL_OINTMENT | Freq: Two times a day (BID) | CUTANEOUS | 0 refills | Status: DC

## 2023-06-15 MED ORDER — ROSUVASTATIN CALCIUM 10 MG PO TABS: 5.0000 mg | ORAL_TABLET | Freq: Every day | ORAL | 3 refills | Status: DC

## 2023-06-15 MED ORDER — EMPAGLIFLOZIN 25 MG PO TABS: 25.0000 mg | ORAL_TABLET | Freq: Every day | ORAL | 3 refills | Status: DC

## 2023-06-15 MED ORDER — NICOTINE 14 MG/24HR TD PT24: 14.0000 mg | MEDICATED_PATCH | Freq: Every day | TRANSDERMAL | 0 refills | Status: DC

## 2023-06-15 NOTE — Progress Notes (Unsigned)
    SUBJECTIVE:   Chief compliant/HPI: annual examination  Monica Huang is a 42 y.o. who presents today for an annual exam.   History tabs reviewed and updated. PMH significant for blindness 2/2 pseudotumor cerebri.   Enjoys going outside.   Would like to lose some weight. Eating well.  A1c is 7.3% up from 6.4%.  Review of systems form reviewed and notable for none.   Smokes 1/2 ppd. Interested in quitting. Has seen Dr. Raymondo Band in the past. Not interested in seeing him again, Feels equipped to quit. Requests more nicotine patches.   OBJECTIVE:   BP 115/74   Pulse (!) 104   Ht 5\' 8"  (1.727 m)   Wt 298 lb 6.4 oz (135.4 kg)   SpO2 100%   BMI 45.37 kg/m   Physical Exam Vitals reviewed.  Constitutional:      General: She is not in acute distress. Cardiovascular:     Rate and Rhythm: Normal rate and regular rhythm.     Heart sounds: No murmur heard. Pulmonary:     Effort: Pulmonary effort is normal.     Breath sounds: No wheezing, rhonchi or rales.  Musculoskeletal:        General: No swelling or deformity.  Skin:    Comments: Dyshydrosis to the fingers of the R hand  Psychiatric:        Mood and Affect: Mood normal.      ASSESSMENT/PLAN:   Diabetes mellitus without complication (HCC) Not on any meds at present, but with A1c rising (7.3% up frm 6.4% previously, would favor addition of: - Jardiance 25mg  daily  - Crestor 10mg  daily   Dyshydrosis - Fluocinonide 0.05% ointment BID x2 weeks   Essential hypertension, benign Well controlled off medication at present. Will continue to monitor.   Encounter for long-term (current) use of NSAIDs Presently taking 800mg  of ibuprofen daily which is being prescribed by her orthopedic surgeon. Discussed that this is far from an ideal long-term solution. Thankfully is without gastric symptoms at present. - BMP - She has f/u with [facility], will discuss alternate options with them    Annual Examination  See AVS for age  appropriate recommendations.   Asked about intimate partner violence and resources given as appropriate   Cervical cancer screening: prior Pap reviewed, repeat due in 4 years Breast cancer screening: Done in February  Colorectal cancer screening: not applicable given age.  if age 15 or over.   Follow up in 3 months or sooner if indicated.    Eliezer Mccoy, MD [facility]

## 2023-06-15 NOTE — Patient Instructions (Addendum)
I am getting you an ointment for your hands. Use this twice daily. Try to not use it more than 2 weeks at a time.  I'm starting a new daily med for your Diabetes. It's just once a day. When you first start, you may notice that you're peeing more often, this will even out. If you start having issues with UTIs or yeast infections, give me a call.   We will check your kidneys. We may need to talk to your [facility] docs about changing course for your pain.  Eliezer Mccoy, MD

## 2023-06-16 LAB — BASIC METABOLIC PANEL
BUN/Creatinine Ratio: 16 (ref 9–23)
BUN: 11 mg/dL (ref 6–24)
CO2: 23 mmol/L (ref 20–29)
Calcium: 9.7 mg/dL (ref 8.7–10.2)
Chloride: 101 mmol/L (ref 96–106)
Creatinine, Ser: 0.68 mg/dL (ref 0.57–1.00)
Glucose: 158 mg/dL — ABNORMAL HIGH (ref 70–99)
Potassium: 4.3 mmol/L (ref 3.5–5.2)
Sodium: 138 mmol/L (ref 134–144)
eGFR: 113 mL/min/{1.73_m2} (ref >59)

## 2023-06-18 ENCOUNTER — Other Ambulatory Visit: Payer: Self-pay | Admitting: Surgical

## 2023-06-18 ENCOUNTER — Ambulatory Visit (INDEPENDENT_AMBULATORY_CARE_PROVIDER_SITE_OTHER): Payer: 59 | Admitting: Orthopedic Surgery

## 2023-06-18 DIAGNOSIS — M5412 Radiculopathy, cervical region: Secondary | ICD-10-CM

## 2023-06-18 DIAGNOSIS — Z791 Long term (current) use of non-steroidal anti-inflammatories (NSAID): Secondary | ICD-10-CM | POA: Insufficient documentation

## 2023-06-18 DIAGNOSIS — L301 Dyshidrosis [pompholyx]: Secondary | ICD-10-CM | POA: Insufficient documentation

## 2023-06-18 MED ORDER — PREGABALIN 75 MG PO CAPS: 75.0000 mg | ORAL_CAPSULE | Freq: Two times a day (BID) | ORAL | 2 refills | Status: DC

## 2023-06-18 NOTE — Progress Notes (Signed)
Orthopedic Spine Surgery Office Note   Assessment: Patient is a 42 y.o. female with neck pain that radiates into her right upper extremity in a C7 distribution.  Also has hand clumsiness due to numbness and tingling in the right hand. Has not noticed any issues with fine motor skills in the left hand.  No imbalance and no physical exam findings of myelopathy     Plan: -Patient has tried: tylenol, ibuprofen, cymbalta -She has not developed any new or progressive symptoms of myelopathy. She has no physical exam findings of myelopathy. Will continue to monitor for now -In terms of her radiculopathy, I told her her options would be lyrica/gabapentin or injection. She was not interested in injection but wanted to try lyrica so this was prescribed today -She is not a good candidate for elective spine surgery due to her morbid obesity and active nicotine use. Told her she would need to quit nicotine and lose weight prior to a surgery for radiculopathy -Patient should return to office in 12 weeks, x-rays at next visit: None     Patient expressed understanding of the plan and all questions were answered to the patient's satisfaction.    ___________________________________________________________________________     History:   Patient is a 42 y.o. female who comes in for follow up on her cervical spine. Patient has previously undergone C5/6 ACDF with Dr. Otelia Sergeant.  A couple of years after that surgery, she developed pain radiating into her right upper extremity. She feels it start in her neck, radiates into her arm, and goes into the all the fingers. She also notes numbness and paresthesias in all the fingers in the right hand. She has decreased sensation in her left small finger. No other numbness or paresthesias. She is not having any pain radiate into her left upper extremity. She has not noticed any issues with unsteadiness/imbalance. Has had issues with using her right hand due to the  numbness/paresthesias but no loss or decrease in fine motor skills in her left hand.     Treatments tried: Physical therapy, Tylenol, Cymbalta    Physical Exam:   General: no acute distress, appears stated age Neurologic: alert, answering questions appropriately, following commands Respiratory: unlabored breathing on room air, symmetric chest rise Psychiatric: appropriate affect, normal cadence to speech     MSK (spine):   -Strength exam                                                   Left                  Right Grip strength                5/5                  5/5 Interosseus                  5/5                  5/5 Wrist extension            5/5                  5/5 Wrist flexion                 5/5  5/5 Elbow flexion                5/5                  5/5 Deltoid                          5/5                  5/5   EHL                              5/5                  5/5 TA                                 5/5                  5/5 GSC                             5/5                  5/5 Knee extension            5/5                  5/5 Hip flexion                    5/5                  5/5   -Sensory exam                           Sensation intact to light touch in L3-S1 nerve distributions of bilateral lower extremities             Sensation intact to light touch in C5-T1 nerve distributions of bilateral upper extremities (decreased in small finger on the left)   -Brachioradialis DTR: 2/4 on the left, 2/4 on the right -Biceps DTR: 2/4 on the left, 2/4 on the right -Achilles DTR: 1/4 on the left, 1/4 on the right -Patellar tendon DTR: 2/4 on the left, 2/4 on the right   -Spurling: Negative bilaterally -Hoffman sign: Negative bilaterally -Clonus: No beats bilaterally -Interosseous wasting: None seen -Grip and release test: Negative -Romberg: Negative   Tinel's at wrist: Positive on the right, negative on the left Phalen's at wrist: Negative  bilaterally Durkan's: Positive bilaterally   Imaging: XR of the cervical spine from 01/31/2023 was previously independently reviewed and interpreted, showing anterior instrumentation at C5 and C6.  Screws are not backed out.  No lucency around the screws.  There appears to be fusion mass across the former disc space at C5-6.  Neutral alignment.  No fracture or dislocation.   MRI of the cervical spine from 01/09/2023 was previously independently reviewed and interpreted, showing foraminal stenosis on the left at C3-4, central stenosis posterior to C5 and C6, T2 cord signal change behind the former C5-6 disc space, right-sided paracentral disc herniation at C6-7, bilateral foraminal stenosis at C6/7.     Patient name: Monica Huang Patient MRN: [id number] Date of visit: 06/18/23

## 2023-06-18 NOTE — Assessment & Plan Note (Signed)
-   Fluocinonide 0.05% ointment BID x2 weeks

## 2023-06-18 NOTE — Assessment & Plan Note (Signed)
Presently taking 800mg  of ibuprofen daily which is being prescribed by her orthopedic surgeon. Discussed that this is far from an ideal long-term solution. Thankfully is without gastric symptoms at present. - BMP - She has f/u with [facility], will discuss alternate options with them

## 2023-06-18 NOTE — Assessment & Plan Note (Signed)
Well controlled off medication at present. Will continue to monitor.

## 2023-06-18 NOTE — Assessment & Plan Note (Signed)
Not on any meds at present, but with A1c rising (7.3% up frm 6.4% previously, would favor addition of: - Jardiance 25mg  daily  - Crestor 10mg  daily

## 2023-06-19 NOTE — Addendum Note (Signed)
Addended by: Darnelle Spangle B on: 06/19/2023 09:49 AM   Modules accepted: Orders

## 2023-06-25 ENCOUNTER — Telehealth: Payer: Self-pay

## 2023-06-25 NOTE — Telephone Encounter (Signed)
Patient calls nurse line regarding confusion with medications.   She reports that she has been taking rosuvastatin. She originally thought that this was for her blood sugar. Advised that rosuvastatin is for her cholesterol and Jardiance is for her blood sugar.   Per chart review, rosuvastatin was discontinued. Will forward to PCP for clarification on if patient should continue taking medication.   Veronda Prude, RN

## 2023-07-06 ENCOUNTER — Ambulatory Visit (INDEPENDENT_AMBULATORY_CARE_PROVIDER_SITE_OTHER): Payer: 59

## 2023-07-06 VITALS — Ht 68.0 in | Wt 298.0 lb

## 2023-07-06 DIAGNOSIS — Z0000 Encounter for general adult medical examination without abnormal findings: Secondary | ICD-10-CM

## 2023-07-06 DIAGNOSIS — Z0100 Encounter for examination of eyes and vision without abnormal findings: Secondary | ICD-10-CM

## 2023-07-07 NOTE — Patient Instructions (Signed)
Monica Huang , Thank you for taking time to come for your Medicare Wellness Visit. I appreciate your ongoing commitment to your health goals. Please review the following plan we discussed and let me know if I can assist you in the future.   Referrals/Orders/Follow-Ups/Clinician Recommendations: Aim for 30 minutes of exercise or brisk walking, 6-8 glasses of water, and 5 servings of fruits and vegetables each day.  This is a list of the screening recommended for you and due dates:  Health Maintenance  Topic Date Due   Eye exam for diabetics  06/03/2015   Complete foot exam   02/16/2017   COVID-19 Vaccine (5 - 2023-24 season) 07/28/2022   Flu Shot  06/28/2023   DTaP/Tdap/Td vaccine (2 - Td or Tdap) 08/07/2023   Yearly kidney health urinalysis for diabetes  09/26/2023   Hemoglobin A1C  12/16/2023   Yearly kidney function blood test for diabetes  06/14/2024   Medicare Annual Wellness Visit  07/05/2024   Pap Smear  05/11/2025   Hepatitis C Screening  Completed   HIV Screening  Completed   HPV Vaccine  Aged Out    Advanced directives: (ACP Link)Information on Advanced Care Planning can be found at [facility] Advance Health Care Directives Advance Health Care Directives ([email or web address]   Next Medicare Annual Wellness Visit scheduled for next year: Yes  Preventive Care 40-64 Years, Female Preventive care refers to lifestyle choices and visits with your health care provider that can promote health and wellness. What does preventive care include? A yearly physical exam. This is also called an annual well check. Dental exams once or twice a year. Routine eye exams. Ask your health care provider how often you should have your eyes checked. Personal lifestyle choices, including: Daily care of your teeth and gums. Regular physical activity. Eating a healthy diet. Avoiding tobacco and drug use. Limiting alcohol use. Practicing safe sex. Taking low-dose aspirin daily starting  at age 81. Taking vitamin and mineral supplements as recommended by your health care provider. What happens during an annual well check? The services and screenings done by your health care provider during your annual well check will depend on your age, overall health, lifestyle risk factors, and family history of disease. Counseling  Your health care provider may ask you questions about your: Alcohol use. Tobacco use. Drug use. Emotional well-being. Home and relationship well-being. Sexual activity. Eating habits. Work and work Astronomer. Method of birth control. Menstrual cycle. Pregnancy history. Screening  You may have the following tests or measurements: Height, weight, and BMI. Blood pressure. Lipid and cholesterol levels. These may be checked every 5 years, or more frequently if you are over 33 years old. Skin check. Lung cancer screening. You may have this screening every year starting at age 27 if you have a 30-pack-year history of smoking and currently smoke or have quit within the past 15 years. Fecal occult blood test (FOBT) of the stool. You may have this test every year starting at age 35. Flexible sigmoidoscopy or colonoscopy. You may have a sigmoidoscopy every 5 years or a colonoscopy every 10 years starting at age 68. Hepatitis C blood test. Hepatitis B blood test. Sexually transmitted disease (STD) testing. Diabetes screening. This is done by checking your blood sugar (glucose) after you have not eaten for a while (fasting). You may have this done every 1-3 years. Mammogram. This may be done every 1-2 years. Talk to your health care provider about when you should start having regular  mammograms. This may depend on whether you have a family history of breast cancer. BRCA-related cancer screening. This may be done if you have a family history of breast, ovarian, tubal, or peritoneal cancers. Pelvic exam and Pap test. This may be done every 3 years starting at age 9.  Starting at age 36, this may be done every 5 years if you have a Pap test in combination with an HPV test. Bone density scan. This is done to screen for osteoporosis. You may have this scan if you are at high risk for osteoporosis. Discuss your test results, treatment options, and if necessary, the need for more tests with your health care provider. Vaccines  Your health care provider may recommend certain vaccines, such as: Influenza vaccine. This is recommended every year. Tetanus, diphtheria, and acellular pertussis (Tdap, Td) vaccine. You may need a Td booster every 10 years. Zoster vaccine. You may need this after age 34. Pneumococcal 13-valent conjugate (PCV13) vaccine. You may need this if you have certain conditions and were not previously vaccinated. Pneumococcal polysaccharide (PPSV23) vaccine. You may need one or two doses if you smoke cigarettes or if you have certain conditions. Talk to your health care provider about which screenings and vaccines you need and how often you need them. This information is not intended to replace advice given to you by your health care provider. Make sure you discuss any questions you have with your health care provider. Document Released: 12/10/2015 Document Revised: 08/02/2016 Document Reviewed: 09/14/2015 Elsevier Interactive Patient Education  2017 [employer].    Fall Prevention in the Home Falls can cause injuries. They can happen to people of all ages. There are many things you can do to make your home safe and to help prevent falls. What can I do on the outside of my home? Regularly fix the edges of walkways and driveways and fix any cracks. Remove anything that might make you trip as you walk through a door, such as a raised step or threshold. Trim any bushes or trees on the path to your home. Use bright outdoor lighting. Clear any walking paths of anything that might make someone trip, such as rocks or tools. Regularly check to see if  handrails are loose or broken. Make sure that both sides of any steps have handrails. Any raised decks and porches should have guardrails on the edges. Have any leaves, snow, or ice cleared regularly. Use sand or salt on walking paths during winter. Clean up any spills in your garage right away. This includes oil or grease spills. What can I do in the bathroom? Use night lights. Install grab bars by the toilet and in the tub and shower. Do not use towel bars as grab bars. Use non-skid mats or decals in the tub or shower. If you need to sit down in the shower, use a plastic, non-slip stool. Keep the floor dry. Clean up any water that spills on the floor as soon as it happens. Remove soap buildup in the tub or shower regularly. Attach bath mats securely with double-sided non-slip rug tape. Do not have throw rugs and other things on the floor that can make you trip. What can I do in the bedroom? Use night lights. Make sure that you have a light by your bed that is easy to reach. Do not use any sheets or blankets that are too big for your bed. They should not hang down onto the floor. Have a firm chair that has side  arms. You can use this for support while you get dressed. Do not have throw rugs and other things on the floor that can make you trip. What can I do in the kitchen? Clean up any spills right away. Avoid walking on wet floors. Keep items that you use a lot in easy-to-reach places. If you need to reach something above you, use a strong step stool that has a grab bar. Keep electrical cords out of the way. Do not use floor polish or wax that makes floors slippery. If you must use wax, use non-skid floor wax. Do not have throw rugs and other things on the floor that can make you trip. What can I do with my stairs? Do not leave any items on the stairs. Make sure that there are handrails on both sides of the stairs and use them. Fix handrails that are broken or loose. Make sure that  handrails are as long as the stairways. Check any carpeting to make sure that it is firmly attached to the stairs. Fix any carpet that is loose or worn. Avoid having throw rugs at the top or bottom of the stairs. If you do have throw rugs, attach them to the floor with carpet tape. Make sure that you have a light switch at the top of the stairs and the bottom of the stairs. If you do not have them, ask someone to add them for you. What else can I do to help prevent falls? Wear shoes that: Do not have high heels. Have rubber bottoms. Are comfortable and fit you well. Are closed at the toe. Do not wear sandals. If you use a stepladder: Make sure that it is fully opened. Do not climb a closed stepladder. Make sure that both sides of the stepladder are locked into place. Ask someone to hold it for you, if possible. Clearly mark and make sure that you can see: Any grab bars or handrails. First and last steps. Where the edge of each step is. Use tools that help you move around (mobility aids) if they are needed. These include: Canes. Walkers. Scooters. Crutches. Turn on the lights when you go into a dark area. Replace any light bulbs as soon as they burn out. Set up your furniture so you have a clear path. Avoid moving your furniture around. If any of your floors are uneven, fix them. If there are any pets around you, be aware of where they are. Review your medicines with your doctor. Some medicines can make you feel dizzy. This can increase your chance of falling. Ask your doctor what other things that you can do to help prevent falls. This information is not intended to replace advice given to you by your health care provider. Make sure you discuss any questions you have with your health care provider. Document Released: 09/09/2009 Document Revised: 04/20/2016 Document Reviewed: 12/18/2014 Elsevier Interactive Patient Education  2017 [employer].

## 2023-07-07 NOTE — Progress Notes (Cosign Needed)
Subjective:   Monica Huang is a 42 y.o. female who presents for an Initial Medicare Annual Wellness Visit.  Visit Complete: Virtual  I connected with  Monica Huang on 07/06/23 by a audio enabled telemedicine application and verified that I am speaking with the correct person using two identifiers.  Patient Location: Home  Provider Location: Home Office  I discussed the limitations of evaluation and management by telemedicine. The patient expressed understanding and agreed to proceed.  Vital Signs: Unable to obtain new vitals due to this being a telehealth visit.  Review of Systems     Cardiac Risk Factors include: diabetes mellitus;smoking/ tobacco exposure;hypertension;sedentary lifestyle     Objective:    Today's Vitals   07/07/23 1924  Weight: 298 lb (135.2 kg)  Height: 5\' 8"  (1.727 m)   Body mass index is 45.31 kg/m.     07/07/2023    7:32 PM 11/08/2022   11:41 AM 05/19/2022    9:16 AM 05/11/2022    2:44 PM 09/01/2021    3:40 PM 04/26/2021    5:15 PM 10/25/2020    4:12 PM  Advanced Directives  Does Patient Have a Medical Advance Directive? No No No No No No No  Would patient like information on creating a medical advance directive? Yes (MAU/Ambulatory/Procedural Areas - Information given) No - Patient declined No - Patient declined  No - Patient declined No - Patient declined No - Patient declined    Current Medications (verified) Outpatient Encounter Medications as of 07/06/2023  Medication Sig   clindamycin (CLEOCIN) 300 MG capsule TAKE 1 CAPSULE BY MOUTH TWICE A DAY   empagliflozin (JARDIANCE) 25 MG TABS tablet Take 1 tablet (25 mg total) by mouth daily.   fluocinonide ointment (LIDEX) 0.05 % Apply 1 Application topically 2 (two) times daily.   ibuprofen (ADVIL) 800 MG tablet TAKE 1 TABLET BY MOUTH EVERY 8 HOURS AS NEEDED FOR MODERATE PAIN.   nicotine (NICODERM CQ - DOSED IN MG/24 HOURS) 14 mg/24hr patch Place 1 patch (14 mg total) onto the skin daily.    pregabalin (LYRICA) 75 MG capsule Take 1 capsule (75 mg total) by mouth 2 (two) times daily.   No facility-administered encounter medications on file as of 07/06/2023.    Allergies (verified) Patient has no known allergies.   History: Past Medical History:  Diagnosis Date   Ambulates with cane    Chronic back pain    Chronic leg pain    Diabetes mellitus without complication (HCC)    Type II - no meds   Hyperlipidemia    diet controlle - no meds   Legally blind    uses cane - some limited vision   Macular degeneration    patient denies this dx  (dr street dx)   Pseudotumor cerebri syndrome 2005   shunt placed- and legally blind   Sciatica    Sleep apnea    "Mild" - has appt on 06/23/20 to see about cpap machine   Wears partial dentures    upper and lower   Past Surgical History:  Procedure Laterality Date   ANTERIOR CERVICAL DECOMP/DISCECTOMY FUSION N/A 05/25/2020   Procedure: ANTERIOR CERVICAL DISCECTOMY AND FUSION CERVICAL FIVE THROUGH CERVICAL SIX WITH PLATES, SCREWS, ALLOGRAFT AND LOCAL BONE GRAFT, VIVIGEN;  Surgeon: Kerrin Champagne, MD;  Location: [facility] OR;  Service: Orthopedics;  Laterality: N/A;   CSF SHUNT     2 revisions   MULTIPLE EXTRACTIONS WITH ALVEOLOPLASTY Bilateral 11/02/2017   Procedure: MULTIPLE EXTRACTION;  Surgeon:  Ocie Doyne, DDS;  Location: [facility] OR;  Service: Oral Surgery;  Laterality: Bilateral;   Family History  Problem Relation Age of Onset   Diabetes Mother    Hypertension Mother    Diabetes Father    Social History   Socioeconomic History   Marital status: Single    Spouse name: Not on file   Number of children: Not on file   Years of education: Not on file   Highest education level: Not on file  Occupational History   Not on file  Tobacco Use   Smoking status: Every Day    Current packs/day: 0.50    Average packs/day: 0.5 packs/day for 21.0 years (10.5 ttl pk-yrs)    Types: Cigarettes   Smokeless tobacco: Never  Vaping Use   Vaping  status: Never Used  Substance and Sexual Activity   Alcohol use: Yes    Comment: occasional   Drug use: Yes    Types: Marijuana    Comment: marijuana -- bag per day. - -4 joints/day   Sexual activity: Yes    Birth control/protection: None    Comment: 1 partners  Other Topics Concern   Not on file  Social History Narrative   On disability since 2005 due to vision loss from psuedotumor cerebri.Did not finish highschool-- completed the 9th grade. Walking 2 x week for .    Right handed    Social Determinants of Health   Financial Resource Strain: Low Risk  (07/07/2023)   Overall Financial Resource Strain (CARDIA)    Difficulty of Paying Living Expenses: Not hard at all  Food Insecurity: No Food Insecurity (07/07/2023)   Hunger Vital Sign    Worried About Running Out of Food in the Last Year: Never true    Ran Out of Food in the Last Year: Never true  Transportation Needs: No Transportation Needs (07/07/2023)   PRAPARE - Administrator, Civil Service (Medical): No    Lack of Transportation (Non-Medical): No  Physical Activity: Insufficiently Active (07/07/2023)   Exercise Vital Sign    Days of Exercise per Week: 2 days    Minutes of Exercise per Session: 30 min  Stress: No Stress Concern Present (07/07/2023)   [employer] of Occupational Health - Occupational Stress Questionnaire    Feeling of Stress : Not at all  Social Connections: Moderately Isolated (07/07/2023)   Social Connection and Isolation Panel [NHANES]    Frequency of Communication with Friends and Family: More than three times a week    Frequency of Social Gatherings with Friends and Family: Three times a week    Attends Religious Services: 1 to 4 times per year    Active Member of Clubs or Organizations: No    Attends Banker Meetings: Never    Marital Status: Never married    Tobacco Counseling Ready to quit: Not Answered Counseling given: Not Answered   Clinical  Intake:  Pre-visit preparation completed: Yes  Pain : No/denies pain     Diabetes: Yes CBG done?: No Did pt. bring in CBG monitor from home?: No  How often do you need to have someone help you when you read instructions, pamphlets, or other written materials from your doctor or pharmacy?: 1 - Never  Interpreter Needed?: No  Information entered by :: Kandis Fantasia LPN   Activities of Daily Living    07/07/2023    7:30 PM  In your present state of health, do you have any difficulty performing the following  activities:  Hearing? 0  Vision? 1  Difficulty concentrating or making decisions? 0  Walking or climbing stairs? 0  Dressing or bathing? 0  Doing errands, shopping? 0  Preparing Food and eating ? N  Using the Toilet? N  In the past six months, have you accidently leaked urine? N  Do you have problems with loss of bowel control? N  Managing your Medications? N  Managing your Finances? N  Housekeeping or managing your Housekeeping? N    Patient Care Team: Alicia Amel, MD as PCP - General (Family Medicine) Lennette Bihari, MD as PCP - Cardiology (Cardiology) London Sheer, MD as Consulting Physician (Orthopedic Surgery)  Indicate any recent Medical Services you may have received from other than [facility] providers in the past year (date may be approximate).     Assessment:   This is a routine wellness examination for Monica Huang.  Hearing/Vision screen Hearing Screening - Comments:: Denies hearing difficulties   Vision Screening - Comments:: No vision problems; will schedule routine eye exam soon    Dietary issues and exercise activities discussed:     Goals Addressed             This Visit's Progress    Increase physical activity        Depression Screen    07/07/2023    7:28 PM 11/08/2022   11:41 AM 05/19/2022    9:17 AM 05/11/2022    2:43 PM 02/20/2022    9:42 AM 09/01/2021    3:41 PM 05/11/2021    9:00 AM  PHQ 2/9 Scores  PHQ - 2 Score 0 0 0 0  1 0 1  PHQ- 9 Score  0 1 0 4 0 2    Fall Risk    07/07/2023    7:30 PM 06/15/2023    4:10 PM 11/08/2022   11:41 AM 10/25/2020    4:12 PM 10/21/2019    8:50 AM  Fall Risk   Falls in the past year? 0 0 1 0 0  Number falls in past yr: 0 0 0 0 0  Injury with Fall? 0 0 0 0   Risk for fall due to : No Fall Risks      Follow up Falls prevention discussed;Education provided;Falls evaluation completed    Falls evaluation completed    MEDICARE RISK AT HOME:  Medicare Risk at Home - 07/07/23 1930     Any stairs in or around the home? No    If so, are there any without handrails? No    Home free of loose throw rugs in walkways, pet beds, electrical cords, etc? Yes    Adequate lighting in your home to reduce risk of falls? Yes    Life alert? No    Use of a cane, walker or w/c? Yes    Grab bars in the bathroom? No    Shower chair or bench in shower? No    Elevated toilet seat or a handicapped toilet? No             TIMED UP AND GO:  Was the test performed? No    Cognitive Function:        07/07/2023    7:31 PM  6CIT Screen  What Year? 0 points  What month? 0 points  What time? 0 points  Count back from 20 0 points  Months in reverse 0 points  Repeat phrase 0 points  Total Score 0 points    Immunizations Immunization History  Administered Date(s) Administered   PFIZER(Purple Top)SARS-COV-2 Vaccination 02/16/2020, 03/15/2020, 09/29/2020   Pfizer Covid-19 Vaccine Bivalent Booster 41yrs & up 09/01/2021   Tdap 08/06/2013    TDAP status: Up to date  Flu Vaccine status: Due, Education has been provided regarding the importance of this vaccine. Advised may receive this vaccine at local pharmacy or Health Dept. Aware to provide a copy of the vaccination record if obtained from local pharmacy or Health Dept. Verbalized acceptance and understanding.  Pneumococcal vaccine status: Up to date  Covid-19 vaccine status: Information provided on how to obtain vaccines.    Qualifies for Shingles Vaccine? No    Screening Tests Health Maintenance  Topic Date Due   OPHTHALMOLOGY EXAM  06/03/2015   FOOT EXAM  02/16/2017   COVID-19 Vaccine (5 - 2023-24 season) 07/28/2022   INFLUENZA VACCINE  06/28/2023   DTaP/Tdap/Td (2 - Td or Tdap) 08/07/2023   Diabetic kidney evaluation - Urine ACR  09/26/2023   HEMOGLOBIN A1C  12/16/2023   Diabetic kidney evaluation - eGFR measurement  06/14/2024   Medicare Annual Wellness (AWV)  07/05/2024   PAP SMEAR-Modifier  05/11/2025   Hepatitis C Screening  Completed   HIV Screening  Completed   HPV VACCINES  Aged Out    Health Maintenance  Health Maintenance Due  Topic Date Due   OPHTHALMOLOGY EXAM  06/03/2015   FOOT EXAM  02/16/2017   COVID-19 Vaccine (5 - 2023-24 season) 07/28/2022   INFLUENZA VACCINE  06/28/2023    Mammogram status: Completed 01/15/23. Repeat every year  Lung Cancer Screening: (Low Dose CT Chest recommended if Age 54-80 years, 20 pack-year currently smoking OR have quit w/in 15years.) does not qualify.   Lung Cancer Screening Referral: n/a  Additional Screening:  Hepatitis C Screening: does qualify; Completed 05/11/21  Vision Screening: Recommended annual ophthalmology exams for early detection of glaucoma and other disorders of the eye. Is the patient up to date with their annual eye exam?  No  Who is the provider or what is the name of the office in which the patient attends annual eye exams? none If pt is not established with a provider, would they like to be referred to a provider to establish care? Yes .   Dental Screening: Recommended annual dental exams for proper oral hygiene  Diabetic Foot Exam: Diabetic Foot Exam: Overdue, Pt has been advised about the importance in completing this exam. Pt is scheduled for diabetic foot exam on at next office visit .  Community Resource Referral / Chronic Care Management: CRR required this visit?  No   CCM required this visit?  No     Plan:      I have personally reviewed and noted the following in the patient's chart:   Medical and social history Use of alcohol, tobacco or illicit drugs  Current medications and supplements including opioid prescriptions. Patient is not currently taking opioid prescriptions. Functional ability and status Nutritional status Physical activity Advanced directives List of other physicians Hospitalizations, surgeries, and ER visits in previous 12 months Vitals Screenings to include cognitive, depression, and falls Referrals and appointments  In addition, I have reviewed and discussed with patient certain preventive protocols, quality metrics, and best practice recommendations. A written personalized care plan for preventive services as well as general preventive health recommendations were provided to patient.     Kandis Fantasia [place], [place]   1/61/0960   After Visit Summary: (Mail) Due to this being a telephonic visit, the after visit summary with patients  personalized plan was offered to patient via mail   Nurse Notes: No concerns at this time

## 2023-07-16 ENCOUNTER — Ambulatory Visit: Payer: 59 | Attending: Cardiovascular Disease | Admitting: Cardiovascular Disease

## 2023-07-16 DIAGNOSIS — I10 Essential (primary) hypertension: Secondary | ICD-10-CM | POA: Diagnosis not present

## 2023-07-16 DIAGNOSIS — H548 Legal blindness, as defined in USA: Secondary | ICD-10-CM

## 2023-07-16 DIAGNOSIS — G4733 Obstructive sleep apnea (adult) (pediatric): Secondary | ICD-10-CM | POA: Diagnosis not present

## 2023-07-16 DIAGNOSIS — I445 Left posterior fascicular block: Secondary | ICD-10-CM

## 2023-07-16 DIAGNOSIS — I4510 Unspecified right bundle-branch block: Secondary | ICD-10-CM | POA: Diagnosis not present

## 2023-07-16 NOTE — Progress Notes (Unsigned)
Cardiology Office Note    Date:  07/16/2023   ID:  Monica Huang, DOB November 18, 1981, MRN [id number]  PCP:  Alicia Amel, MD  Cardiologist:  Nicki Guadalajara, MD   50-month follow-up evaluation  History of Present Illness:  Monica Huang is a 42 y.o. female who has a history of blindness, diabetes mellitus, and hidradenitis who was referred through the courtesy of Dr. Mosetta Putt for further evaluation of dizziness.  I initially saw her in October 2018.  I last saw her in May 2023.  She presents for 80-month follow-up evaluation.   Monica Huang is legally blind secondary to pseudotumor cerebri and is status post VP shunting.  In 2017 she was seen by Dr. Allyson Sabal for evaluation of presyncope.  She had a history of hypertension on diuretics as well as diabetes.  She was smoking 1 pack of cigarettes per day.  She denied chest pain or dyspnea.  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  She often felt dizzy when she would get up.  He did not feel her cardiac exam was significant and at that point did not recommend further cardiac testing.   The patient states that she has had recurrent episodes of dizziness, particularly if she sits for long periods of time.  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  She denies any recent episodes of syncope.  She denies any episodes of chest pressure.  She has noticed lightheadedness.  At times she notes her heart rate increases.  She denies any awareness of significant arrhythmia.  She denies chest pressure.  She continues to smoke one half pack per day.  She was recently [facility] and because of her continued recurrent symptomatology and an ECG which suggested irregular bradycardia with potential dropped beats, most likely due to PACs.     She underwent an echo Doppler study which showed an EF of 60 to 65%, mild MR and mild TR.  A 48-hour Holter monitor showed an average rate of 88 bpm and  predominant sinus rhythm.  There were episodes of sinus bradycardia with minimum heart rate of 41, and sinus tachycardia up to 144 bpm.  There was also an episode of winky block second-degree type I block.  There were 3 episodes of second-degree heart block with ventricular pauses greater than 2 seconds.   She was seen in the emergency room in January 2021 with palpitations.  She left AMA.  She was seen by Gillie Manners in March 2021 he continued to have episodes of presyncope about once every other week. She underwent a Zio patch monitor in April 2021 which showed an average heart rate at 99 bpm with sinus rhythm with minimum sinus bradycardia 46 bpm with a maximum sinus tachycardia at 144 bpm.  Her slowest heart rate occurred at 25 bpm at 6:34 AM.  She had several episodes of second-degree Mobitz type I block, and one episode where her rate ranged from 37 to 60 bpm with another episode with rates ranging from 46 to 78 bpm.  There were isolated PACs.  There were no episodes of atrial fibrillation.  Due to concerns for obstructive apnea she underwent a sleep study on March 22, 2020 which showed mild overall sleep apnea with AHI of 5.1 and RDI of 6.9/h.  However sleep apnea was moderate during REM sleep (AHI19/h).  Oxygen desaturation to 89%.   I saw her for follow-up evaluation on  June 23, 2020 in a telemedicine encounter. At that time, her dizziness was better.  She typically goes to bed between 930 and 10 PM and wakes up around 5 AM when she works in 7 AM without work.  She had undergone herniated disc surgery of her neck by Dr. Louanne Skye, in June 2021.  Presently she denies chest pain or shortness of breath.  She is not sleeping well.  She had not yet had a CPAP titration.  I last saw her on October 12, 2020.  Since her prior evaluation she was started on AutoPap therapy.  She was told by the DME company that all she needed to do was to use her CPAP for 4 hours. Her set up date was around August 29, 2020. I obtained  a download today from October 16 through October 10, 2020. Presently she is not meeting compliance with only 63% of usage days. Average usage is only 3 hours and 33 minutes. Her AutoPap was set at a range of 6 to 20 cm of water. When used AHI is excellent at 2.6. When she has been using the CPAP she does notice improvement in how she feels. An Epworth Sleepiness Scale score was calculated in the office today and this endorsed that seven arguing against significant residual daytime sleepiness.  During her evaluation I had a lengthy discussion with her regarding the effects of sleep apnea on normal sleep architecture and potential adverse cardiovascular consequences if left untreated.  We discussed optimal sleep duration of at least 7 to 8 hours with use of CPAP through the nights entirety.  I discussed the preponderance of REM sleep occurring in the second half of the night and the importance that she use CPAP for the sleep duration particularly since her events were more severe during REM sleep.  I changed her CPAP settings to a auto range of 9 to 18 cm of water.  I last saw her on May 4./2024. Since her prior evaluation she has been seen by several providers.  She denies any recent chest pain.  She is unaware of any significant tachypalpitations or bradycardia arrhythmic events.  She continues to work at [employer] the [employer].  She typically goes to bed between 9 and 10 PM and wakes up at 5 AM.  I obtained a download from April 4 through Mar 29, 2022.  She is meeting compliance standards.  Average use is significantly improved but still suboptimal at only 6 hours and 25 minutes per night.  At her current pressure settings, AHI is excellent at 1.0.  Her 95th percentile pressure however was elevated at 16.9 with maximum average pressure at 17.6.  An Epworth Sleepiness Scale score was calculated in the office today and this endorsed at 6 arguing against residual daytime sleepiness.  She is no longer on  gabapentin or pregabalin.  She continues to be on rifampin and clindamycin for her hydradenitis.  She presents for evaluation.  Past Medical History:  Diagnosis Date   Ambulates with cane    Chronic back pain    Chronic leg pain    Diabetes mellitus without complication (HCC)    Type II - no meds   Hyperlipidemia    diet controlle - no meds   Legally blind    uses cane - some limited vision   Macular degeneration    patient denies this dx  (dr street dx)   Pseudotumor cerebri syndrome 2005   shunt placed- and legally blind   Sciatica  Sleep apnea    "Mild" - has appt on 06/23/20 to see about cpap machine   Wears partial dentures    upper and lower    Past Surgical History:  Procedure Laterality Date   ANTERIOR CERVICAL DECOMP/DISCECTOMY FUSION N/A 05/25/2020   Procedure: ANTERIOR CERVICAL DISCECTOMY AND FUSION CERVICAL FIVE THROUGH CERVICAL SIX WITH PLATES, SCREWS, ALLOGRAFT AND LOCAL BONE GRAFT, VIVIGEN;  Surgeon: Kerrin Champagne, MD;  Location: [facility] OR;  Service: Orthopedics;  Laterality: N/A;   CSF SHUNT     2 revisions   MULTIPLE EXTRACTIONS WITH ALVEOLOPLASTY Bilateral 11/02/2017   Procedure: MULTIPLE EXTRACTION;  Surgeon: Ocie Doyne, DDS;  Location: [facility] OR;  Service: Oral Surgery;  Laterality: Bilateral;    Current Medications: Outpatient Medications Prior to Visit  Medication Sig Dispense Refill   clindamycin (CLEOCIN) 300 MG capsule TAKE 1 CAPSULE BY MOUTH TWICE A DAY 60 capsule 0   empagliflozin (JARDIANCE) 25 MG TABS tablet Take 1 tablet (25 mg total) by mouth daily. 30 tablet 3   ibuprofen (ADVIL) 800 MG tablet TAKE 1 TABLET BY MOUTH EVERY 8 HOURS AS NEEDED FOR MODERATE PAIN. 40 tablet 1   fluocinonide ointment (LIDEX) 0.05 % Apply 1 Application topically 2 (two) times daily. (Patient not taking: Reported on 07/16/2023) 30 g 0   nicotine (NICODERM CQ - DOSED IN MG/24 HOURS) 14 mg/24hr patch Place 1 patch (14 mg total) onto the skin daily. (Patient not taking: Reported  on 07/16/2023) 28 patch 0   pregabalin (LYRICA) 75 MG capsule Take 1 capsule (75 mg total) by mouth 2 (two) times daily. (Patient not taking: Reported on 07/16/2023) 60 capsule 2   No facility-administered medications prior to visit.     Allergies:   Patient has no known allergies.   Social History   Socioeconomic History   Marital status: Single    Spouse name: Not on file   Number of children: Not on file   Years of education: Not on file   Highest education level: Not on file  Occupational History   Not on file  Tobacco Use   Smoking status: Every Day    Current packs/day: 0.50    Average packs/day: 0.5 packs/day for 21.0 years (10.5 ttl pk-yrs)    Types: Cigarettes   Smokeless tobacco: Never  Vaping Use   Vaping status: Never Used  Substance and Sexual Activity   Alcohol use: Yes    Comment: occasional   Drug use: Yes    Types: Marijuana    Comment: marijuana -- bag per day. - -4 joints/day   Sexual activity: Yes    Birth control/protection: None    Comment: 1 partners  Other Topics Concern   Not on file  Social History Narrative   On disability since 2005 due to vision loss from psuedotumor cerebri.Did not finish highschool-- completed the 9th grade. Walking 2 x week for .    Right handed    Social Determinants of Health   Financial Resource Strain: Low Risk  (07/07/2023)   Overall Financial Resource Strain (CARDIA)    Difficulty of Paying Living Expenses: Not hard at all  Food Insecurity: No Food Insecurity (07/07/2023)   Hunger Vital Sign    Worried About Running Out of Food in the Last Year: Never true    Ran Out of Food in the Last Year: Never true  Transportation Needs: No Transportation Needs (07/07/2023)   PRAPARE - Administrator, Civil Service (Medical): No    Lack  of Transportation (Non-Medical): No  Physical Activity: Insufficiently Active (07/07/2023)   Exercise Vital Sign    Days of Exercise per Week: 2 days    Minutes of Exercise  per Session: 30 min  Stress: No Stress Concern Present (07/07/2023)   [employer] of Occupational Health - Occupational Stress Questionnaire    Feeling of Stress : Not at all  Social Connections: Moderately Isolated (07/07/2023)   Social Connection and Isolation Panel [NHANES]    Frequency of Communication with Friends and Family: More than three times a week    Frequency of Social Gatherings with Friends and Family: Three times a week    Attends Religious Services: 1 to 4 times per year    Active Member of Clubs or Organizations: No    Attends Banker Meetings: Never    Marital Status: Never married     Family History:  The patient's family history includes Diabetes in her father and mother; Hypertension in her mother.   ROS General: Negative; No fevers, chills, or night sweats;  HEENT: Patient is blind; No changes in  hearing, sinus congestion, difficulty swallowing Pulmonary: Negative; No cough, wheezing, shortness of breath, hemoptysis Cardiovascular: Negative; No chest pain, presyncope, syncope, palpitations GI: Negative; No nausea, vomiting, diarrhea, or abdominal pain GU: Negative; No dysuria, hematuria, or difficulty voiding Musculoskeletal: Negative; no myalgias, joint pain, or weakness Hematologic/Oncology: Negative; no easy bruising, bleeding Endocrine: Negative; no heat/cold intolerance; no diabetes Neuro: Negative; no changes in balance, headaches Skin: Negative; No rashes or skin lesions Psychiatric: Negative; No behavioral problems, depression Sleep: Negative; No snoring, daytime sleepiness, hypersomnolence, bruxism, restless legs, hypnogognic hallucinations, no cataplexy Other comprehensive 14 point system review is negative.   PHYSICAL EXAM:   VS:  BP 112/78 (BP Location: Left Arm, Patient Position: Sitting, Cuff Size: Normal)   Pulse 90   Ht 5\' 8"  (1.727 m)   Wt 296 lb 6.4 oz (134.4 kg)   SpO2 98%   BMI 45.07 kg/m     Repeat blood pressure  by me was 122/78  Wt Readings from Last 3 Encounters:  07/16/23 296 lb 6.4 oz (134.4 kg)  07/07/23 298 lb (135.2 kg)  06/15/23 298 lb 6.4 oz (135.4 kg)      Physical Exam BP 112/78 (BP Location: Left Arm, Patient Position: Sitting, Cuff Size: Normal)   Pulse 90   Ht 5\' 8"  (1.727 m)   Wt 296 lb 6.4 oz (134.4 kg)   SpO2 98%   BMI 45.07 kg/m  General: Alert, oriented, no distress.  Skin: normal turgor, no rashes, warm and dry HEENT: Normocephalic, atraumatic. Pupils equal round and reactive to light; sclera anicteric; extraocular muscles intact; Fundi ** Nose without nasal septal hypertrophy Mouth/Parynx benign; Mallinpatti scale Neck: No JVD, no carotid bruits; normal carotid upstroke Lungs: clear to ausculatation and percussion; no wheezing or rales Chest wall: without tenderness to palpitation Heart: PMI not displaced, RRR, s1 s2 normal, 1/6 systolic murmur, no diastolic murmur, no rubs, gallops, thrills, or heaves Abdomen: soft, nontender; no hepatosplenomehaly, BS+; abdominal aorta nontender and not dilated by palpation. Back: no CVA tenderness Pulses 2+ Musculoskeletal: full range of motion, normal strength, no joint deformities Extremities: no clubbing cyanosis or edema, Homan's sign negative  Neurologic: grossly nonfocal; Cranial nerves grossly wnl Psychologic: Normal mood and affect    General: Alert, oriented, no distress.  Skin: normal turgor, no rashes, warm and dry HEENT: Normocephalic, atraumatic. Pupils equal round and reactive to light; sclera anicteric; extraocular muscles intact; Fundi **  Nose without nasal septal hypertrophy Mouth/Parynx benign; Mallinpatti scale 3/4 Neck: Thick neck; no JVD, no carotid bruits; normal carotid upstroke Lungs: clear to ausculatation and percussion; no wheezing or rales Chest wall: without tenderness to palpitation Heart: PMI not displaced, RRR, s1 s2 normal, 1/6 systolic murmur, no diastolic murmur, no rubs, gallops, thrills,  or heaves Abdomen: Central adiposity; soft, nontender; no hepatosplenomehaly, BS+; abdominal aorta nontender and not dilated by palpation. Back: no CVA tenderness Pulses 2+ Musculoskeletal: full range of motion, normal strength, no joint deformities Extremities: no clubbing cyanosis or edema, Homan's sign negative  Neurologic: grossly nonfocal; Cranial nerves grossly wnl Psychologic: Normal mood and affect   Studies/Labs Reviewed:    EKG Interpretation Date/Time:  Monday July 16 2023 15:07:25 EDT Ventricular Rate:  89 PR Interval:  208 QRS Duration:  110 QT Interval:  350 QTC Calculation: 425 R Axis:   121  Text Interpretation: Normal sinus rhythm Incomplete right bundle branch block Left posterior fascicular block Minimal voltage criteria for LVH, may be normal variant ( Cornell product ) Septal infarct (cited on or before 19-May-2020) Inferior infarct , age undetermined When compared with ECG of 19-May-2020 15:01, PR interval has decreased Left posterior fascicular block is now Present Incomplete right bundle branch block is now Present Inferior infarct is now Present Confirmed by Nicki Guadalajara ([id number]) on 07/16/2023 3:57:06 PM    Mar 30, 2022 ECG (independently read by me):NSR at 84, 1st degree AV block; PR 256 msec, possible LAE, LAD  October 12, 2020 ECG (independently read by me): Sinus rhythm at 84; 1st degree AV block, PR 238 msec  I personally reviewed her last ECG from May 19, 2020 which showed normal sinus rhythm at 87, first-degree AV block with a PR interval of 244 ms, and left ventricular hypertrophy.    Recent Labs:    Latest Ref Rng & Units 06/15/2023    4:59 PM 09/25/2022    3:33 PM 09/27/2021    4:00 PM  BMP  Glucose 70 - 99 mg/dL 604  540  981   BUN 6 - 24 mg/dL 11  12  9    Creatinine 0.57 - 1.00 mg/dL 1.91  4.78  2.95   BUN/Creat Ratio 9 - 23 16  15  13    Sodium 134 - 144 mmol/L 138  140  141   Potassium 3.5 - 5.2 mmol/L 4.3  4.3  4.6   Chloride 96 - 106  mmol/L 101  100  103   CO2 20 - 29 mmol/L 23  22  25    Calcium 8.7 - 10.2 mg/dL 9.7  9.3  9.0         Latest Ref Rng & Units 09/27/2021    4:00 PM 05/19/2020    3:22 PM 12/22/2019    2:34 PM  Hepatic Function  Total Protein 6.0 - 8.5 g/dL 7.4  7.5  7.6   Albumin 3.8 - 4.8 g/dL 4.0  3.5  3.6   AST 0 - 40 IU/L 11  16  14    ALT 0 - 32 IU/L 10  19  15    Alk Phosphatase 44 - 121 IU/L 62  57  51   Total Bilirubin 0.0 - 1.2 mg/dL 0.2  0.5  0.6        Latest Ref Rng & Units 10/05/2021    1:13 PM 09/27/2021    4:00 PM 05/11/2021   10:03 AM  CBC  WBC 3.8 - 10.8 Thousand/uL 7.3  9.1  8.5  Hemoglobin 11.7 - 15.5 g/dL 16.1  09.6  04.5   Hematocrit 35.0 - 45.0 % 39.2  38.6  39.9   Platelets 140 - 400 Thousand/uL 349  339  289    Lab Results  Component Value Date   MCV 97.8 10/05/2021   MCV 95 09/27/2021   MCV 95 05/11/2021   Lab Results  Component Value Date   TSH 0.769 02/03/2019   Lab Results  Component Value Date   HGBA1C 7.3 (A) 06/15/2023     BNP No results found for: "BNP"  ProBNP No results found for: "PROBNP"   Lipid Panel     Component Value Date/Time   CHOL 181 09/25/2022 1533   TRIG 155 (H) 09/25/2022 1533   HDL 49 09/25/2022 1533   CHOLHDL 3.7 09/25/2022 1533   CHOLHDL 3.3 03/11/2015 0841   VLDL 21 03/11/2015 0841   LDLCALC 105 (H) 09/25/2022 1533   LDLDIRECT 100 (H) 01/01/2012 1155   LABVLDL 27 09/25/2022 1533     RADIOLOGY: No results found.   Additional studies/ records that were reviewed today include:    SLEEP STUDY: 03/22/2020 SLEEP ARCHITECTURE The study was initiated at 9:56:10 PM and ended at 3:59:43 AM.   Sleep onset time was 46.4 minutes and the sleep efficiency was 64.6%%. The total sleep time was 235 minutes.   Stage REM latency was 58.5 minutes.   The patient spent 6.8%% of the night in stage N1 sleep, 67.7%% in stage N2 sleep, 0.0%% in stage N3 and 25.5% in REM.   Alpha intrusion was absent.   Supine sleep was 66.81%.    RESPIRATORY PARAMETERS The overall apnea/hypopnea index (AHI) was 5.1 per hour.  The respiratory disturbancer index (RDI) was 6.9/h. There were 0 total apneas, including 0 obstructive, 0 central and 0 mixed apneas. There were 20 hypopneas and 7 RERAs.   The AHI during Stage REM sleep was 19.0 per hour.   AHI while supine was 3.8 per hour.   The mean oxygen saturation was 96.2%. The minimum SpO2 during sleep was 89.0%.   Loud noring in all positions was noted during this study.   CARDIAC DATA The 2 lead EKG demonstrated sinus rhythm. The mean heart rate was 82.2 beats per minute. Other EKG findings include: None.   LEG MOVEMENT DATA The total PLMS were 0 with a resulting PLMS index of 0.0. Associated arousal with leg movement index was 0.0 .   IMPRESSIONS - Mild obstructive sleep apnea occurred during this study (AHI 5.1/h; RDI 6.9/h); however, sleep apnea was moderate during REM sleep (AHI 19.0/h) - No significant central sleep apnea occurred during this study (CAI = 0.0/h). - The patient had minimal  desaturation during the study (Min O2 = 89.0%) - Snoring was audible during this study. - Reduced sleep efficiency at 64.6%. Wake after sleep onset (WASO) was 82.1 minutes. - No cardiac abnormalities were noted during this study. - Clinically significant periodic limb movements did not occur during sleep. No significant associated arousals.   DIAGNOSIS - Obstructive Sleep Apnea (327.23 [G47.33 ICD-10]) - Nocturnal Hypoxemia (327.26 [G47.36 ICD-10])   RECOMMENDATIONS - In this patient with significant comorbidities including significant morbid obesity and moderate sleep apnea during REM sleep, recommend CPAP therapy for optimal treatment of her sleep disordered breathing. With her blindness, recommend CPAP titration study. If unable to obtain, then consider Auto-PAP at 6 -16 cm of water. If patient is against CPAP can consider a customized oral appliance. - Effort should be made to  optimize nasal and oropharyngeal patency. - Avoid alcohol, sedatives and other CNS depressants that may worsen sleep apnea and disrupt normal sleep architecture. - Sleep hygiene should be reviewed to assess factors that may improve sleep quality. - Weight management (BMI 46) and regular exercise should be initiated or continued if appropriate. - Sleep clinic evaluation if necessary to discuss options. If CPAP instituted recommend a download in 30 days and sleep clinic evaluation.    ASSESSMENT:    1. Essential hypertension, benign     PLAN:  Monica Huang is a very pleasant 42 year old African-American female who has a history of blindness, diabetes mellitus, and is status post VP shunting for pseudotumor cerebri which led to her being legally blind.  Remotely she had had episodes of dizziness but these have resolved and prior Zio patch monitoring had shown bradycardia and Mobitz type I block without episodes of atrial fibrillation.  At her evaluation with me in November 2021, she was on AutoPap therapy for her mild overall sleep apnea however sleep apnea was moderate during REM sleep with an AHI of 19/h.  At that time, I had a lengthy discussion with her regarding the effects of sleep apnea on normal sleep architecture and potential adverse cardiovascular consequences if left untreated. We discussed optimal sleep duration to be at least 7 to 8 hours per night and that she should use CPAP for the nights entirety. We discussed that the preponderance of REM sleep occurs in the second half of the night when her sleep apnea is more significant she has not been using therapy.  At that time, CPAP duration was suboptimal and she had been told by her DME company that she only needed to use therapy for 4 hours.  I discussed with her the importance of using CPAP through the nights entirety and in particular since preponderance of REM sleep occurs in the second half of the night the importance of using it for  the nights duration.  Her most recent download is showing compliance.  However CPAP use is still suboptimal at 6 hours and 25 minutes but significantly improved from previously.  Her AHI is excellent at 1.0.  With her current high 95th percentile pressure at 16.9, I will change her pressure adjustment to a range of 11 to 20 cm of water.  We discussed potential weight loss and exercise if at all possible.  She is followed by Dr. Salvadore Dom.  Her blood pressure today is stable.  I will see her in 2 years for follow-up evaluation or sooner as needed.   Medication Adjustments/Labs and Tests Ordered: Current medicines are reviewed at length with the patient today.  Concerns regarding medicines are outlined above.  Medication changes, Labs and Tests ordered today are listed in the Patient Instructions below. There are no Patient Instructions on file for this visit.   Signed, Nicki Guadalajara, MD  07/16/2023 3:54 PM    [facility] [address], [place], [place]  [address] Phone: [phone]

## 2023-07-16 NOTE — Patient Instructions (Signed)
Medication Instructions:  *If you need a refill on your cardiac medications before your next appointment, please call your pharmacy*   Lab Work: If you have labs (blood work) drawn today and your tests are completely normal, you will receive your results only by: MyChart Message (if you have MyChart) OR A paper copy in the mail If you have any lab test that is abnormal or we need to change your treatment, we will call you to review the results.    Follow-Up: At [facility], you and your health needs are our priority.  As part of our continuing mission to provide you with exceptional heart care, we have created designated Provider Care Teams.  These Care Teams include your primary Cardiologist (physician) and Advanced Practice Providers (APPs -  Physician Assistants and Nurse Practitioners) who all work together to provide you with the care you need, when you need it.  We recommend signing up for the patient portal called "MyChart".  Sign up information is provided on this After Visit Summary.  MyChart is used to connect with patients for Virtual Visits (Telemedicine).  Patients are able to view lab/test results, encounter notes, upcoming appointments, etc.  Non-urgent messages can be sent to your provider as well.   To learn more about what you can do with MyChart, go to [email or web address].    Your next appointment:   9 month(s)  Provider:   Nicki Guadalajara, MD

## 2023-07-17 ENCOUNTER — Encounter: Payer: Self-pay | Admitting: Cardiovascular Disease

## 2023-07-17 ENCOUNTER — Telehealth: Payer: Self-pay

## 2023-07-17 DIAGNOSIS — B3731 Acute candidiasis of vulva and vagina: Secondary | ICD-10-CM

## 2023-07-17 NOTE — Telephone Encounter (Signed)
Pt calling to ask Dr Marisue Humble if he would send in the medication for the yeast infection you warned her about. Please send to [facility]. Sunday Spillers, CMA

## 2023-07-18 MED ORDER — FLUCONAZOLE 150 MG PO TABS: 150.0000 mg | ORAL_TABLET | Freq: Once | ORAL | 0 refills | Status: AC

## 2023-08-09 ENCOUNTER — Other Ambulatory Visit: Payer: Self-pay | Admitting: Student

## 2023-08-09 ENCOUNTER — Other Ambulatory Visit: Payer: Self-pay | Admitting: Surgical

## 2023-08-09 DIAGNOSIS — L732 Hidradenitis suppurativa: Secondary | ICD-10-CM

## 2023-08-20 NOTE — Telephone Encounter (Signed)
Patient is calling and would like to have a referral placed for Ophthalmology. Patient was told during her medicare wellness visit that it would be done.   This has to be placed by provider not health coach. Shanda Bumps noted that in the referral note.   Please advise.

## 2023-08-23 ENCOUNTER — Other Ambulatory Visit: Payer: Self-pay

## 2023-08-23 DIAGNOSIS — L732 Hidradenitis suppurativa: Secondary | ICD-10-CM

## 2023-08-23 DIAGNOSIS — E119 Type 2 diabetes mellitus without complications: Secondary | ICD-10-CM

## 2023-08-24 ENCOUNTER — Other Ambulatory Visit: Payer: Self-pay | Admitting: *Deleted

## 2023-08-24 DIAGNOSIS — E119 Type 2 diabetes mellitus without complications: Secondary | ICD-10-CM

## 2023-08-27 MED ORDER — CLINDAMYCIN HCL 300 MG PO CAPS: 300.0000 mg | ORAL_CAPSULE | Freq: Two times a day (BID) | ORAL | 0 refills | Status: DC

## 2023-08-27 MED ORDER — EMPAGLIFLOZIN 25 MG PO TABS: 25.0000 mg | ORAL_TABLET | Freq: Every day | ORAL | 3 refills | Status: DC

## 2023-09-04 ENCOUNTER — Telehealth: Payer: Self-pay | Admitting: Cardiovascular Disease

## 2023-09-04 NOTE — Telephone Encounter (Signed)
Returned patients call and notified her dme Christoper Allegra to please mail her supplies.

## 2023-09-04 NOTE — Telephone Encounter (Signed)
Patient states she has called multiple times regarding new CPAP supplies but she has not heard back from anyone. Please advise.

## 2023-09-06 ENCOUNTER — Other Ambulatory Visit: Payer: Self-pay | Admitting: Radiology

## 2023-09-06 MED ORDER — IBUPROFEN 800 MG PO TABS: ORAL_TABLET | ORAL | 1 refills | Status: DC

## 2023-09-10 ENCOUNTER — Telehealth: Payer: Self-pay

## 2023-09-10 ENCOUNTER — Other Ambulatory Visit: Payer: Self-pay | Admitting: Student

## 2023-09-10 DIAGNOSIS — N898 Other specified noninflammatory disorders of vagina: Secondary | ICD-10-CM

## 2023-09-10 MED ORDER — FLUCONAZOLE 150 MG PO TABS: 150.0000 mg | ORAL_TABLET | Freq: Once | ORAL | 0 refills | Status: AC

## 2023-09-10 NOTE — Telephone Encounter (Signed)
Patient calls nurse line regarding side effects to Jardiance.   Vaginal itching and discharge starting Saturday, 09/07/23.  Denies painful urination, back pain, abdominal pain, fever or chills.   If appropriate, patient is requesting treatment for yeast infection to be sent to [facility] on [address].   Veronda Prude, RN

## 2023-09-10 NOTE — Progress Notes (Signed)
Spoke with pt on the phone regarding symptoms of vaginal yeast infection which she has had before. Describes vaginal itching and increased discharged (she cannot confirm color as she is bind). No fever, abdominal pain, no new sexual partners. Rx for diflucan sent to pharmacy. Can hold jardiance until symptoms resolve, discussed scheduling apt with PCP to discuss medication options if she continues to get yeast infections on jardiance. If symptoms do not resolve, pt advised to schedule apt.

## 2023-09-21 NOTE — Telephone Encounter (Signed)
Reached out to see if patient received her supplies but got no answer could not leave a message.

## 2023-09-28 ENCOUNTER — Ambulatory Visit (INDEPENDENT_AMBULATORY_CARE_PROVIDER_SITE_OTHER): Payer: 59

## 2023-09-28 ENCOUNTER — Ambulatory Visit (HOSPITAL_COMMUNITY)
Admission: EM | Admit: 2023-09-28 | Discharge: 2023-09-28 | Disposition: A | Discharge status: Home / Self Care | Payer: 59 | Attending: Emergency Medicine | Admitting: Emergency Medicine

## 2023-09-28 ENCOUNTER — Encounter (HOSPITAL_COMMUNITY): Payer: Self-pay

## 2023-09-28 DIAGNOSIS — M791 Myalgia, unspecified site: Secondary | ICD-10-CM

## 2023-09-28 DIAGNOSIS — M898X1 Other specified disorders of bone, shoulder: Secondary | ICD-10-CM

## 2023-09-28 MED ORDER — METHYLPREDNISOLONE ACETATE 40 MG/ML IJ SUSP
40.0000 mg | Freq: Once | INTRAMUSCULAR | Status: AC
  Administered 2023-09-28: 40 mg via INTRAMUSCULAR

## 2023-09-28 MED ORDER — METHYLPREDNISOLONE ACETATE 40 MG/ML IJ SUSP
INTRAMUSCULAR | Status: AC
  Filled 2023-09-28: qty 1

## 2023-09-28 MED ORDER — CYCLOBENZAPRINE HCL 10 MG PO TABS: 10.0000 mg | ORAL_TABLET | Freq: Three times a day (TID) | ORAL | 0 refills | Status: DC | PRN
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
## 2023-09-28 NOTE — Discharge Instructions (Addendum)
I did not see any abnormality on xray. It might take a few hours for the radiologist to read it. I will call you if they see something different than me.  You can take the muscle relaxer (Flexeril) 2-3 times daily. If the medication makes you drowsy, take only at bed time.  The steroid injection will hopefully reduce overall pain and inflammation. Please contact your orthopedic specialist for further evaluation and management. If symptoms worsen, go to the emergency department.
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
## 2023-10-01 ENCOUNTER — Ambulatory Visit (HOSPITAL_COMMUNITY)
Admission: EM | Admit: 2023-10-01 | Discharge: 2023-10-01 | Disposition: A | Discharge status: Home / Self Care | Payer: 59 | Attending: Emergency Medicine | Admitting: Emergency Medicine

## 2023-10-01 ENCOUNTER — Encounter (HOSPITAL_COMMUNITY): Payer: Self-pay

## 2023-10-01 DIAGNOSIS — M542 Cervicalgia: Secondary | ICD-10-CM | POA: Diagnosis not present

## 2023-10-01 DIAGNOSIS — M79604 Pain in right leg: Secondary | ICD-10-CM

## 2023-10-01 MED ORDER — DEXAMETHASONE SODIUM PHOSPHATE 10 MG/ML IJ SOLN
10.0000 mg | Freq: Once | INTRAMUSCULAR | Status: AC
  Administered 2023-10-01: 10 mg via INTRAMUSCULAR

## 2023-10-01 MED ORDER — DEXAMETHASONE SODIUM PHOSPHATE 10 MG/ML IJ SOLN
INTRAMUSCULAR | Status: AC
  Filled 2023-10-01: qty 1

## 2023-10-01 MED ORDER — METHOCARBAMOL 500 MG PO TABS: 500.0000 mg | ORAL_TABLET | Freq: Two times a day (BID) | ORAL | 0 refills | Status: DC
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
## 2023-10-01 NOTE — Discharge Instructions (Signed)
We have given you an intramuscular steroid injection to help with your pain and inflammation.  This should help with the numbness and tingling.  Continue to take your 800 mg of ibuprofen as scheduled.  You can do heat, gentle stretching and warm Epsom salt baths.  You can do the Robaxin up to 2 times daily, do not drink or drive on this medication as it may cause drowsiness.  Do not use the Robaxin with the Flexeril as these are both muscle relaxers.  If your pain persist beyond the next 5 days or so please follow-up with an orthopedic for further evaluation.

## 2023-10-12 ENCOUNTER — Encounter: Payer: Self-pay | Admitting: Student

## 2023-10-12 ENCOUNTER — Ambulatory Visit (INDEPENDENT_AMBULATORY_CARE_PROVIDER_SITE_OTHER): Payer: 59 | Admitting: Student

## 2023-10-12 VITALS — BP 117/78 | HR 86 | Ht 69.0 in | Wt 301.8 lb

## 2023-10-12 DIAGNOSIS — M25551 Pain in right hip: Secondary | ICD-10-CM

## 2023-10-12 DIAGNOSIS — Z7984 Long term (current) use of oral hypoglycemic drugs: Secondary | ICD-10-CM

## 2023-10-12 DIAGNOSIS — E119 Type 2 diabetes mellitus without complications: Secondary | ICD-10-CM

## 2023-10-12 LAB — POCT GLYCOSYLATED HEMOGLOBIN (HGB A1C): HbA1c, POC (controlled diabetic range): 7.9 % — AB (ref 0.0–7.0)

## 2023-10-12 MED ORDER — ROSUVASTATIN CALCIUM 5 MG PO TABS: 5.0000 mg | ORAL_TABLET | Freq: Every day | ORAL | 3 refills | Status: DC

## 2023-10-12 NOTE — Patient Instructions (Signed)
Monica Huang, [place] to see you! Your A1c is up a bit at 7.9%. I bet you can get this down just by cutting out the sugary beverages.  I'm also re-starting your rosuvastatin, just 5mg  daily. And I'd like ot get an Xray of your Right hip. Please go to [facility] at [employer] to get your X-Ray done. They are open 7:30a-5p Monday-Friday. You do not need an appointment to get this done.   Eliezer Mccoy, MD
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
## 2023-10-14 DIAGNOSIS — M25551 Pain in right hip: Secondary | ICD-10-CM | POA: Insufficient documentation

## 2023-10-14 MED ORDER — SITAGLIPTIN PHOSPHATE 25 MG PO TABS: 25.0000 mg | ORAL_TABLET | Freq: Every day | ORAL | 3 refills | Status: DC

## 2023-10-14 NOTE — Assessment & Plan Note (Addendum)
A1c rising. 7.9% today. She politely declines addition of sitagliptin. Agree that she has room to improve diet-wise. Sugary coffee drinks are likely making things much worse for her.  - She will cut out her morning sugary drink or transition to sugar-free options - Continue Jardiance 25mg  daily - 3 mo follow-up
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
## 2023-10-15 ENCOUNTER — Telehealth: Payer: Self-pay | Admitting: Student

## 2023-10-15 DIAGNOSIS — E119 Type 2 diabetes mellitus without complications: Secondary | ICD-10-CM

## 2023-10-15 MED ORDER — SITAGLIPTIN PHOSPHATE 25 MG PO TABS: 25.0000 mg | ORAL_TABLET | Freq: Every day | ORAL | 3 refills | Status: DC

## 2023-10-15 MED ORDER — FLUCONAZOLE 150 MG PO TABS: ORAL_TABLET | ORAL | 0 refills | Status: DC

## 2023-10-15 NOTE — Telephone Encounter (Signed)
Follow-up discussion from our visit on Friday. She tells me that she thinks she has a yeast infection again, which she attributes to her Jardiance. This is the second time this has happened. She expresses disappointment with this side effect profile and would like to discontinue her Jardiance. We had discussed sitagliptin as an alternative in the past and she would like to give this a try.  - Diflucan x1 - STOP Jardiance 25mg  daily - START Sitagliptin 25mg  daily   Eliezer Mccoy, MD

## 2023-10-22 ENCOUNTER — Telehealth: Payer: Self-pay

## 2023-10-22 NOTE — Patient Outreach (Signed)
Attempted to contact patient regarding care gaps. Left voicemail for patient to return my call at [phone].  Nicholes Rough, CMA Care Guide [employer]

## 2023-10-31 ENCOUNTER — Ambulatory Visit: Payer: 59 | Admitting: Orthopedic Surgery

## 2023-10-31 ENCOUNTER — Other Ambulatory Visit: Payer: Self-pay

## 2023-10-31 DIAGNOSIS — M5412 Radiculopathy, cervical region: Secondary | ICD-10-CM
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
## 2023-11-12 ENCOUNTER — Other Ambulatory Visit: Payer: Self-pay | Admitting: Student

## 2023-11-12 DIAGNOSIS — E119 Type 2 diabetes mellitus without complications: Secondary | ICD-10-CM

## 2023-11-13 ENCOUNTER — Other Ambulatory Visit: Payer: Self-pay

## 2023-11-13 NOTE — Telephone Encounter (Signed)
Patient calls nurse line requesting a refill on Diflucan.   She reports she has still been taking Jardiance. She reports she has not been able to go to the pharmacy to pick up Januvia just yet. She plans to go today.   She reports Jardiance causes yeast infections and she reports she has one now.   She is requesting #2 Diflucan.   Advised will forward to PCP.
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
## 2023-11-14 MED ORDER — FLUCONAZOLE 150 MG PO TABS: ORAL_TABLET | ORAL | 0 refills | Status: DC

## 2023-11-15 ENCOUNTER — Ambulatory Visit: Payer: 59

## 2023-11-16 ENCOUNTER — Ambulatory Visit: Payer: 59
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
## 2023-11-19 ENCOUNTER — Other Ambulatory Visit: Payer: Self-pay

## 2023-11-19 ENCOUNTER — Encounter: Payer: Self-pay | Admitting: Physical Therapy

## 2023-11-19 ENCOUNTER — Ambulatory Visit: Payer: 59 | Attending: Orthopedic Surgery | Admitting: Physical Therapy

## 2023-11-19 DIAGNOSIS — R293 Abnormal posture: Secondary | ICD-10-CM | POA: Insufficient documentation

## 2023-11-19 DIAGNOSIS — M5412 Radiculopathy, cervical region: Secondary | ICD-10-CM | POA: Insufficient documentation

## 2023-11-19 DIAGNOSIS — M6281 Muscle weakness (generalized): Secondary | ICD-10-CM | POA: Diagnosis present
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
## 2023-11-19 NOTE — Addendum Note (Signed)
Addended by: Luis Abed on: 11/19/2023 01:49 PM   Modules accepted: Orders

## 2023-11-26 ENCOUNTER — Ambulatory Visit: Payer: 59 | Admitting: Physical Therapy

## 2023-11-26 ENCOUNTER — Encounter: Payer: Self-pay | Admitting: Physical Therapy

## 2023-11-26 DIAGNOSIS — M5412 Radiculopathy, cervical region: Secondary | ICD-10-CM

## 2023-11-26 DIAGNOSIS — M6281 Muscle weakness (generalized): Secondary | ICD-10-CM

## 2023-11-26 DIAGNOSIS — R293 Abnormal posture: Secondary | ICD-10-CM
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
## 2023-11-29 ENCOUNTER — Encounter: Payer: Self-pay | Admitting: Physical Therapy

## 2023-11-29 ENCOUNTER — Ambulatory Visit: Payer: 59 | Attending: Orthopedic Surgery | Admitting: Physical Therapy

## 2023-11-29 DIAGNOSIS — R293 Abnormal posture: Secondary | ICD-10-CM | POA: Diagnosis present

## 2023-11-29 DIAGNOSIS — M5412 Radiculopathy, cervical region: Secondary | ICD-10-CM | POA: Insufficient documentation

## 2023-11-29 DIAGNOSIS — M6281 Muscle weakness (generalized): Secondary | ICD-10-CM | POA: Diagnosis present

## 2023-11-29 NOTE — Therapy (Signed)
 OUTPATIENT PHYSICAL THERAPY CERVICAL EVALUATION   Patient Name: Monica Huang MRN: [id number] DOB:19-Oct-1981, 43 y.o., female Today's Date: 11/29/2023  END OF SESSION:  PT End of Session - 11/29/23 1206     Visit Number 3    Number of Visits 13    Date for PT Re-Evaluation 12/30/22    Authorization Type [employer]    PT Start Time 1210    PT Stop Time 1300    PT Time Calculation (min) 50 min    Equipment Utilized During Treatment Other (comment)    Activity Tolerance Patient tolerated treatment well    Behavior During Therapy WFL for tasks assessed/performed               Past Medical History:  Diagnosis Date   Ambulates with cane    Chronic back pain    Chronic leg pain    Diabetes mellitus without complication (HCC)    Type II - no meds   Hyperlipidemia    diet controlle - no meds   Legally blind    uses cane - some limited vision   Macular degeneration    patient denies this dx  (dr street dx)   Pseudotumor cerebri syndrome 2005   shunt placed- and legally blind   Sciatica    Sleep apnea    Mild - has appt on 06/23/20 to see about cpap machine   Wears partial dentures    upper and lower   Past Surgical History:  Procedure Laterality Date   ANTERIOR CERVICAL DECOMP/DISCECTOMY FUSION N/A 05/25/2020   Procedure: ANTERIOR CERVICAL DISCECTOMY AND FUSION CERVICAL FIVE THROUGH CERVICAL SIX WITH PLATES, SCREWS, ALLOGRAFT AND LOCAL BONE GRAFT, VIVIGEN;  Surgeon: Lucilla Lynwood BRAVO, MD;  Location: [facility] OR;  Service: Orthopedics;  Laterality: N/A;   CSF SHUNT     2 revisions   MULTIPLE EXTRACTIONS WITH ALVEOLOPLASTY Bilateral 11/02/2017   Procedure: MULTIPLE EXTRACTION;  Surgeon: Sheryle Hamilton, DDS;  Location: [facility] OR;  Service: Oral Surgery;  Laterality: Bilateral;   Patient Active Problem List   Diagnosis Date Noted   Pain of right hip 10/14/2023   Dyshydrosis 06/18/2023   Encounter for long-term (current) use of NSAIDs 06/18/2023   Thoracic radiculopathy 05/25/2022    Abnormal uterine bleeding 05/11/2022   Low back pain potentially associated with radiculopathy 02/20/2022   Diabetes mellitus without complication (HCC)    S/P cervical spinal fusion 05/26/2020   Herniation of cervical intervertebral disc with radiculopathy 05/25/2020    Class: Chronic   Chronic pain of both knees 10/23/2019   Carpal tunnel syndrome 02/03/2019   Anxiety and depression 06/10/2016   Essential hypertension, benign 05/10/2015   Mechanical complication-ventricular(CSF) communicating shunt (HCC) 03/05/2013   Hidradenitis suppurativa 10/15/2012   Tobacco abuse 01/03/2012   Legally blind 01/03/2012   Obesity 01/03/2012    PCP: Marlee Lynwood NOVAK, MD  REFERRING PROVIDER: Georgina Ozell LABOR, MD  REFERRING DIAG: Radiculopathy, cervical region [phone]   THERAPY DIAG:  Radiculopathy, cervical region  Abnormal posture  Muscle weakness (generalized)  Rationale for Evaluation and Treatment: Rehabilitation  ONSET DATE: 09/27/2023  SUBJECTIVE:  SUBJECTIVE STATEMENT: Pt stated she's feeling so, so however much better than last time. Pt has been compliant with her HEP especially doing her ducks (Chin tucks). Also stated that she can finally take some pillows down from her closet and is sleeping better. Hand dominance: Right  PERTINENT HISTORY:  DM 2 Pseudotumorcerbra (2005) Blindness   PAIN:  Are you having pain? NPRS scale: 7/10,Pain location: B shoulders/neck ,Pain description: stabbing,Aggravating factors: R rotation, flexion,Relieving factors: neutral  PRECAUTIONS: None  RED FLAGS: Cervical red flags: none      WEIGHT BEARING RESTRICTIONS: No  FALLS:  Has patient fallen in last 6 months? No  LIVING ENVIRONMENT: Lives with: lives with their  family Lives in: House/apartment Stairs: Yes: External: 20 steps; on right going up Has following equipment at home:  grab rail in shower, Red tipped cane (blindness)  OCCUPATION: not currently employed  PLOF: Independent  PATIENT GOALS: decrease neck pain and improve function  NEXT MD VISIT: 3 Month follow up from the 10/31/2023 visit  OBJECTIVE:  Note: Objective measures were completed at Evaluation unless otherwise noted.  DIAGNOSTIC FINDINGS:  Sterling compression/distraction:+   PATIENT SURVEYS:  FOTO Current:38%  Predicted 57%  COGNITION: Overall cognitive status: Within functional limits for tasks assessed  SENSATION: Light touch: Impaired   POSTURE: rounded shoulders, forward head, and increased thoracic kyphosis  PALPATION: Tenderness to B trapezius, rhomboids and suboccipital musculature  CERVICAL ROM:   Active ROM A/PROM (deg) Eval 11/19/2023  Flexion 50  Extension 40  Right lateral flexion   Left lateral flexion   Right rotation 40  Left rotation 45   (Blank rows = not tested)  UPPER EXTREMITY ROM:  Active ROM Right eval Left eval  Shoulder flexion 140 140  Shoulder extension    Shoulder abduction    Shoulder adduction    Shoulder extension    Shoulder internal rotation    Shoulder external rotation    Elbow flexion    Elbow extension    Wrist flexion    Wrist extension    Wrist ulnar deviation    Wrist radial deviation    Wrist pronation    Wrist supination     (Blank rows = not tested)  UPPER EXTREMITY MMT:  MMT Right eval Left eval  Shoulder flexion    Shoulder extension    Shoulder abduction    Shoulder adduction    Shoulder extension    Shoulder internal rotation    Shoulder external rotation    Middle trapezius    Lower trapezius    Elbow flexion 4- 4  Elbow extension 3+ 4+  Wrist flexion 4- 5  Wrist extension 3 5  Wrist ulnar deviation    Wrist radial deviation    Wrist pronation    Wrist supination    Grip  strength 4- 4   (Blank rows = not tested)  CERVICAL SPECIAL TESTS:  Neck flexor muscle endurance test: Positive, Spurling's test: Positive, and Distraction test: Positive   [facility] Adult PT Treatment:                                                DATE: 11/29/2023  Therapeutic Exercise: Cervical AROM roation and F/E, 2x1' ea. 4 way cervical isometric 2x30s (split in 10s increments) Seated Banded D2 PNF flexion, 2x12 RUE: yellow half stretch; LUE: Yellow full stretch Seated Banded D1  PNF extension, 2x 8 RUE: yellow half stretch; LUE: Yellow full stretch Seated Banded scapular retraction, Landy danie [facility] Adult PT Treatment:                                                DATE: 11/26/2023  Therapeutic Exercise: UBE 4' fwd/bkwd Scalene stretch 2x1' 4 way cervical isometric 2x30s Seated Banded D2 PNF flexion, yellow 2x12 Seated Banded D1  PNF extension, yellow 2x 8 Seated Banded scapular retraction, yellow, 2x8  Manual Therapy: Cervical distraction with suboccipital/trapezius muscle release                                                                                                             PATIENT EDUCATION:  Education details: HEP, functional cervical movement, pain management, healing times. Person educated: Patient and Niece  Education method: Explanation, Demonstration, Tactile cues, Verbal cues, and Handouts Education comprehension: verbalized understanding and returned demonstration  HOME EXERCISE PROGRAM: Access Code: [email or web address] URL: [email or web address] Date: 11/19/2023 Prepared by: Mabel Kiang  Exercises - Seated Cervical Rotation AROM  - 1 x daily - 7 x weekly - 2 sets - 15 reps - Seated Cervical Retraction Protraction AROM  - 1 x daily - 7 x weekly - 3 sets - 6-8 reps - 5s hold - Scapular Retraction with Resistance  - 1 x daily - 7 x weekly - 3 sets - 8-10 reps - 2s hold  ASSESSMENT:  CLINICAL IMPRESSION: Pt attended physical therapy  session for continuation of treatment regarding neck pain a B radiculopathy related symptoms. Pt tolerated treatment great and demonstrated improvement with gross neck ROM and quality of movement, overall reduction in pain. Pt also mentioned increased use of BUE by being able to reach and take pillows out of the closet for the first time since injury.  Pt required precise vocal and tactile cues for safe and appropriate performance of Today's interventions due to visual impairment.     11/19/2023 Eval impression: Patient is a 43 y.o. female who was seen today for physical therapy evaluation and treatment for cervical pain with radiating symptoms down BUE with R>L. Pt  demonstrated limited cervical ROM in all directions, impaired strength and sensation along the RUE with c6 myotome/dermatone being the most affected. Pt noted some tingling symptoms going into L hand at random however it wasn't as bad as the R. Pt is moderately painful with all cervical/shoulder motions, however, tolerated treatment well with a reduction in symptoms and no change in pain. Pt requires precise tactile and verbal cuing due to visual impairments. Pt requires the intervention of skilled outpatient physical therapy to address the aforementioned deficits and progress the patient towards a functional level in line with therapeutic goals.  OBJECTIVE IMPAIRMENTS: decreased mobility, impaired sensation, impaired tone, impaired vision/preception, and pain.   ACTIVITY LIMITATIONS: carrying, lifting, bending, squatting, and sleeping  PARTICIPATION LIMITATIONS: cleaning, interpersonal relationship, community activity, and  occupation  PERSONAL FACTORS: Fitness, Past/current experiences, and 1-2 comorbidities: Blindness, DM2  are also affecting patient's functional outcome.   REHAB POTENTIAL: Fair    CLINICAL DECISION MAKING: Evolving/moderate complexity  EVALUATION COMPLEXITY: Moderate   GOALS: Goals reviewed with patient?  Yes  SHORT TERM GOALS: Target date: 12/10/2023   Improve cervical rotation to 55 degrees B with </= 6/10 pain to improve activity tolerance involving cervical motion Baseline: 40 Goal status: INITIAL  2.  Pt will improve RUE gross strength to 4/5 to demonstrate improvement with contractility and functionality of RUE Baseline: 4- Goal status: INITIAL   LONG TERM GOALS: Target date: 12/31/2023    Pt  will improve FOTO score from 38 to 57% to demonstrate improvement in overall function in relation to current impairments Baseline:  Goal status: INITIAL  2.  Pt will be independent with HEP to demonstrate competency necessary for at home management of symptoms and rehabilitation Baseline:  Goal status: INITIAL   PLAN:  PT FREQUENCY: 2x/week  PT DURATION: 6 weeks  PLANNED INTERVENTIONS: 97110-Therapeutic exercises, 97530- Therapeutic activity, [id number]- Neuromuscular re-education, 97535- Self Care, and [id number]- Manual therapy  PLAN FOR NEXT SESSION: review HEP,  progress PNF pattern quality and ensure HEP independence progress.  Mabel Kiang, PT, DPT 11/29/2023, 1:03 PM

## 2023-12-04 ENCOUNTER — Telehealth: Payer: Self-pay | Admitting: Physical Therapy

## 2023-12-04 ENCOUNTER — Ambulatory Visit: Payer: 59 | Admitting: Physical Therapy

## 2023-12-04 NOTE — Telephone Encounter (Signed)
 Called patient, informed of missed appointment today at 1445, pt stated her audio message gave her incorrect information. Pt was informed of next visit and given options for avoiding communication errors in the future.

## 2023-12-06 ENCOUNTER — Encounter: Payer: Self-pay | Admitting: Physical Therapy

## 2023-12-06 ENCOUNTER — Ambulatory Visit: Payer: 59 | Admitting: Physical Therapy

## 2023-12-06 DIAGNOSIS — M6281 Muscle weakness (generalized): Secondary | ICD-10-CM

## 2023-12-06 DIAGNOSIS — M5412 Radiculopathy, cervical region: Secondary | ICD-10-CM | POA: Diagnosis not present

## 2023-12-06 DIAGNOSIS — R293 Abnormal posture: Secondary | ICD-10-CM

## 2023-12-06 NOTE — Therapy (Signed)
 OUTPATIENT PHYSICAL THERAPY CERVICAL EVALUATION   Patient Name: Monica Huang MRN: [id number] DOB:March 02, 1981, 43 y.o., female Today's Date: 12/06/2023  END OF SESSION:      Past Medical History:  Diagnosis Date   Ambulates with cane    Chronic back pain    Chronic leg pain    Diabetes mellitus without complication (HCC)    Type II - no meds   Hyperlipidemia    diet controlle - no meds   Legally blind    uses cane - some limited vision   Macular degeneration    patient denies this dx  (dr street dx)   Pseudotumor cerebri syndrome 2005   shunt placed- and legally blind   Sciatica    Sleep apnea    Mild - has appt on 06/23/20 to see about cpap machine   Wears partial dentures    upper and lower   Past Surgical History:  Procedure Laterality Date   ANTERIOR CERVICAL DECOMP/DISCECTOMY FUSION N/A 05/25/2020   Procedure: ANTERIOR CERVICAL DISCECTOMY AND FUSION CERVICAL FIVE THROUGH CERVICAL SIX WITH PLATES, SCREWS, ALLOGRAFT AND LOCAL BONE GRAFT, VIVIGEN;  Surgeon: Lucilla Lynwood BRAVO, MD;  Location: [facility] OR;  Service: Orthopedics;  Laterality: N/A;   CSF SHUNT     2 revisions   MULTIPLE EXTRACTIONS WITH ALVEOLOPLASTY Bilateral 11/02/2017   Procedure: MULTIPLE EXTRACTION;  Surgeon: Sheryle Hamilton, DDS;  Location: [facility] OR;  Service: Oral Surgery;  Laterality: Bilateral;   Patient Active Problem List   Diagnosis Date Noted   Pain of right hip 10/14/2023   Dyshydrosis 06/18/2023   Encounter for long-term (current) use of NSAIDs 06/18/2023   Thoracic radiculopathy 05/25/2022   Abnormal uterine bleeding 05/11/2022   Low back pain potentially associated with radiculopathy 02/20/2022   Diabetes mellitus without complication (HCC)    S/P cervical spinal fusion 05/26/2020   Herniation of cervical intervertebral disc with radiculopathy 05/25/2020    Class: Chronic   Chronic pain of both knees 10/23/2019   Carpal tunnel syndrome 02/03/2019   Anxiety and depression 06/10/2016   Essential  hypertension, benign 05/10/2015   Mechanical complication-ventricular(CSF) communicating shunt (HCC) 03/05/2013   Hidradenitis suppurativa 10/15/2012   Tobacco abuse 01/03/2012   Legally blind 01/03/2012   Obesity 01/03/2012    PCP: Marlee Lynwood NOVAK, MD  REFERRING PROVIDER: Georgina Ozell LABOR, MD  REFERRING DIAG: Radiculopathy, cervical region [M54.12]   THERAPY DIAG:  No diagnosis found.  Rationale for Evaluation and Treatment: Rehabilitation  ONSET DATE: 09/27/2023  SUBJECTIVE:  SUBJECTIVE STATEMENT: Reports non-compliance with HEP d/t neck pain following completion of last treatment. Pt highly painful today.  PERTINENT HISTORY:  DM 2 Pseudotumorcerbra (2005) Blindness   PAIN:  Are you having pain? NPRS scale: 7/10,Pain location: B shoulders/neck ,Pain description: stabbing,Aggravating factors: R rotation, flexion,Relieving factors: neutral  PRECAUTIONS: None  RED FLAGS: Cervical red flags: none      WEIGHT BEARING RESTRICTIONS: No  FALLS:  Has patient fallen in last 6 months? No  LIVING ENVIRONMENT: Lives with: lives with their family Lives in: House/apartment Stairs: Yes: External: 20 steps; on right going up Has following equipment at home:  grab rail in shower, Red tipped cane (blindness)  OCCUPATION: not currently employed  PLOF: Independent  PATIENT GOALS: decrease neck pain and improve function  NEXT MD VISIT: 3 Month follow up from the 10/31/2023 visit  OBJECTIVE:  Note: Objective measures were completed at Evaluation unless otherwise noted.  DIAGNOSTIC FINDINGS:  Sterling compression/distraction:+   PATIENT SURVEYS:  FOTO Current:38%  Predicted 57%  COGNITION: Overall cognitive status: Within functional limits for tasks  assessed  SENSATION: Light touch: Impaired   POSTURE: rounded shoulders, forward head, and increased thoracic kyphosis  PALPATION: Tenderness to B trapezius, rhomboids and suboccipital musculature  CERVICAL ROM:   Active ROM A/PROM (deg) Eval 11/19/2023  Flexion 50  Extension 40  Right lateral flexion   Left lateral flexion   Right rotation 40  Left rotation 45   (Blank rows = not tested)  UPPER EXTREMITY ROM:  Active ROM Right eval Left eval  Shoulder flexion 140 140  Shoulder extension    Shoulder abduction    Shoulder adduction    Shoulder extension    Shoulder internal rotation    Shoulder external rotation    Elbow flexion    Elbow extension    Wrist flexion    Wrist extension    Wrist ulnar deviation    Wrist radial deviation    Wrist pronation    Wrist supination     (Blank rows = not tested)  UPPER EXTREMITY MMT:  MMT Right eval Left eval  Shoulder flexion    Shoulder extension    Shoulder abduction    Shoulder adduction    Shoulder extension    Shoulder internal rotation    Shoulder external rotation    Middle trapezius    Lower trapezius    Elbow flexion 4- 4  Elbow extension 3+ 4+  Wrist flexion 4- 5  Wrist extension 3 5  Wrist ulnar deviation    Wrist radial deviation    Wrist pronation    Wrist supination    Grip strength 4- 4   (Blank rows = not tested)  CERVICAL SPECIAL TESTS:  Neck flexor muscle endurance test: Positive, Spurling's test: Positive, and Distraction test: Positive   [facility] Adult PT Treatment:                                                DATE: 12/06/2023  Therapeutic Exercise: Cervical AROM roation and F/E, 2x1' ea. Chin tucks Upper trap stretch 2x1' Seated Banded D2 PNF flexion, 2x12 RUE: yellow half stretch; LUE: Yellow full stretch Seated Banded D1  PNF extension, 2x 8 RUE: yellow half stretch; LUE: Yellow full stretch Seated Banded scapular retraction, Green, 2x12 Manual Therapy: Upper trap manual  release    [facility] Adult PT Treatment:  DATE: 11/29/2023  Therapeutic Exercise: Cervical AROM roation and F/E, 2x1' ea. 4 way cervical isometric 2x30s (split in 10s increments) Seated Banded D2 PNF flexion, 2x12 RUE: yellow half stretch; LUE: Yellow full stretch Seated Banded D1  PNF extension, 2x 8 RUE: yellow half stretch; LUE: Yellow full stretch Seated Banded scapular retraction, Landy danie [facility] Adult PT Treatment:                                                DATE: 11/26/2023  Therapeutic Exercise: UBE 4' fwd/bkwd Scalene stretch 2x1' 4 way cervical isometric 2x30s Seated Banded D2 PNF flexion, yellow 2x12 Seated Banded D1  PNF extension, yellow 2x 8 Seated Banded scapular retraction, yellow, 2x8  Manual Therapy: Cervical distraction with suboccipital/trapezius muscle release                                                                                                             PATIENT EDUCATION:  Education details: HEP, functional cervical movement, pain management, healing times. Person educated: Patient and Niece  Education method: Explanation, Demonstration, Tactile cues, Verbal cues, and Handouts Education comprehension: verbalized understanding and returned demonstration  HOME EXERCISE PROGRAM: Access Code: [email or web address] URL: [email or web address] Date: 11/19/2023 Prepared by: Mabel Kiang  Exercises - Seated Cervical Rotation AROM  - 1 x daily - 7 x weekly - 2 sets - 15 reps - Seated Cervical Retraction Protraction AROM  - 1 x daily - 7 x weekly - 3 sets - 6-8 reps - 5s hold - Scapular Retraction with Resistance  - 1 x daily - 7 x weekly - 3 sets - 8-10 reps - 2s hold  ASSESSMENT:  CLINICAL IMPRESSION: Pt attended physical therapy session for continuation of treatment regarding cervicalgia and radiculopathy. Pt tolerated treatment fairly and demonstrated improvement with overhead mobility and  strength throughout all B Shoulder ROM . Some difficulties maintained with neck pain, especially with cervical extension and B rotation. Pt showed good benefit from manual therapy to release tension in upper trap, scalenes and rotator cuff. Pt required minimal tactile cues for safe and appropriate performance of today's activities.      11/19/2023 Eval impression: Patient is a 43 y.o. female who was seen today for physical therapy evaluation and treatment for cervical pain with radiating symptoms down BUE with R>L. Pt  demonstrated limited cervical ROM in all directions, impaired strength and sensation along the RUE with c6 myotome/dermatone being the most affected. Pt noted some tingling symptoms going into L hand at random however it wasn't as bad as the R. Pt is moderately painful with all cervical/shoulder motions, however, tolerated treatment well with a reduction in symptoms and no change in pain. Pt requires precise tactile and verbal cuing due to visual impairments. Pt requires the intervention of skilled outpatient physical therapy to address the aforementioned deficits and progress the patient towards a functional level in line with therapeutic  goals.  OBJECTIVE IMPAIRMENTS: decreased mobility, impaired sensation, impaired tone, impaired vision/preception, and pain.   ACTIVITY LIMITATIONS: carrying, lifting, bending, squatting, and sleeping  PARTICIPATION LIMITATIONS: cleaning, interpersonal relationship, community activity, and occupation  PERSONAL FACTORS: Fitness, Past/current experiences, and 1-2 comorbidities: Blindness, DM2  are also affecting patient's functional outcome.   REHAB POTENTIAL: Fair    CLINICAL DECISION MAKING: Evolving/moderate complexity  EVALUATION COMPLEXITY: Moderate   GOALS: Goals reviewed with patient? Yes  SHORT TERM GOALS: Target date: 12/10/2023   Improve cervical rotation to 55 degrees B with </= 6/10 pain to improve activity tolerance involving  cervical motion Baseline: 40 Goal status: INITIAL  2.  Pt will improve RUE gross strength to 4/5 to demonstrate improvement with contractility and functionality of RUE Baseline: 4- Goal status: INITIAL   LONG TERM GOALS: Target date: 12/31/2023    Pt  will improve FOTO score from 38 to 57% to demonstrate improvement in overall function in relation to current impairments Baseline:  Goal status: INITIAL  2.  Pt will be independent with HEP to demonstrate competency necessary for at home management of symptoms and rehabilitation Baseline:  Goal status: INITIAL   PLAN:  PT FREQUENCY: 2x/week  PT DURATION: 6 weeks  PLANNED INTERVENTIONS: 97110-Therapeutic exercises, 97530- Therapeutic activity, [id number]- Neuromuscular re-education, 97535- Self Care, and 02859- Manual therapy  PLAN FOR NEXT SESSION: review HEP,  progress PNF pattern quality and ensure HEP independence progress.  Mabel Kiang, PT, DPT 12/06/2023, 4:43 PM

## 2023-12-10 NOTE — Therapy (Deleted)
 OUTPATIENT PHYSICAL THERAPY CERVICAL EVALUATION   Patient Name: Monica Huang MRN: [id number] DOB:September 20, 1981, 43 y.o., female Today's Date: 12/10/2023  END OF SESSION:      Past Medical History:  Diagnosis Date   Ambulates with cane    Chronic back pain    Chronic leg pain    Diabetes mellitus without complication (HCC)    Type II - no meds   Hyperlipidemia    diet controlle - no meds   Legally blind    uses cane - some limited vision   Macular degeneration    patient denies this dx  (dr street dx)   Pseudotumor cerebri syndrome 2005   shunt placed- and legally blind   Sciatica    Sleep apnea    Mild - has appt on 06/23/20 to see about cpap machine   Wears partial dentures    upper and lower   Past Surgical History:  Procedure Laterality Date   ANTERIOR CERVICAL DECOMP/DISCECTOMY FUSION N/A 05/25/2020   Procedure: ANTERIOR CERVICAL DISCECTOMY AND FUSION CERVICAL FIVE THROUGH CERVICAL SIX WITH PLATES, SCREWS, ALLOGRAFT AND LOCAL BONE GRAFT, VIVIGEN;  Surgeon: Lucilla Lynwood BRAVO, MD;  Location: [facility] OR;  Service: Orthopedics;  Laterality: N/A;   CSF SHUNT     2 revisions   MULTIPLE EXTRACTIONS WITH ALVEOLOPLASTY Bilateral 11/02/2017   Procedure: MULTIPLE EXTRACTION;  Surgeon: Sheryle Hamilton, DDS;  Location: [facility] OR;  Service: Oral Surgery;  Laterality: Bilateral;   Patient Active Problem List   Diagnosis Date Noted   Pain of right hip 10/14/2023   Dyshydrosis 06/18/2023   Encounter for long-term (current) use of NSAIDs 06/18/2023   Thoracic radiculopathy 05/25/2022   Abnormal uterine bleeding 05/11/2022   Low back pain potentially associated with radiculopathy 02/20/2022   Diabetes mellitus without complication (HCC)    S/P cervical spinal fusion 05/26/2020   Herniation of cervical intervertebral disc with radiculopathy 05/25/2020    Class: Chronic   Chronic pain of both knees 10/23/2019   Carpal tunnel syndrome 02/03/2019   Anxiety and depression 06/10/2016   Essential  hypertension, benign 05/10/2015   Mechanical complication-ventricular(CSF) communicating shunt (HCC) 03/05/2013   Hidradenitis suppurativa 10/15/2012   Tobacco abuse 01/03/2012   Legally blind 01/03/2012   Obesity 01/03/2012    PCP: Marlee Lynwood NOVAK, MD  REFERRING PROVIDER: Georgina Ozell LABOR, MD  REFERRING DIAG: Radiculopathy, cervical region [M54.12]   THERAPY DIAG:  No diagnosis found.  Rationale for Evaluation and Treatment: Rehabilitation  ONSET DATE: 09/27/2023  SUBJECTIVE:  SUBJECTIVE STATEMENT: Reports non-compliance with HEP d/t neck pain following completion of last treatment. Pt highly painful today.  PERTINENT HISTORY:  DM 2 Pseudotumorcerbra (2005) Blindness   PAIN:  Are you having pain? NPRS scale: 7/10,Pain location: B shoulders/neck ,Pain description: stabbing,Aggravating factors: R rotation, flexion,Relieving factors: neutral  PRECAUTIONS: None  RED FLAGS: Cervical red flags: none      WEIGHT BEARING RESTRICTIONS: No  FALLS:  Has patient fallen in last 6 months? No  LIVING ENVIRONMENT: Lives with: lives with their family Lives in: House/apartment Stairs: Yes: External: 20 steps; on right going up Has following equipment at home:  grab rail in shower, Red tipped cane (blindness)  OCCUPATION: not currently employed  PLOF: Independent  PATIENT GOALS: decrease neck pain and improve function  NEXT MD VISIT: 3 Month follow up from the 10/31/2023 visit  OBJECTIVE:  Note: Objective measures were completed at Evaluation unless otherwise noted.  DIAGNOSTIC FINDINGS:  Sterling compression/distraction:+   PATIENT SURVEYS:  FOTO Current:38%  Predicted 57%  COGNITION: Overall cognitive status: Within functional limits for tasks  assessed  SENSATION: Light touch: Impaired   POSTURE: rounded shoulders, forward head, and increased thoracic kyphosis  PALPATION: Tenderness to B trapezius, rhomboids and suboccipital musculature  CERVICAL ROM:   Active ROM A/PROM (deg) Eval 11/19/2023  Flexion 50  Extension 40  Right lateral flexion   Left lateral flexion   Right rotation 40  Left rotation 45   (Blank rows = not tested)  UPPER EXTREMITY ROM:  Active ROM Right eval Left eval  Shoulder flexion 140 140  Shoulder extension    Shoulder abduction    Shoulder adduction    Shoulder extension    Shoulder internal rotation    Shoulder external rotation    Elbow flexion    Elbow extension    Wrist flexion    Wrist extension    Wrist ulnar deviation    Wrist radial deviation    Wrist pronation    Wrist supination     (Blank rows = not tested)  UPPER EXTREMITY MMT:  MMT Right eval Left eval  Shoulder flexion    Shoulder extension    Shoulder abduction    Shoulder adduction    Shoulder extension    Shoulder internal rotation    Shoulder external rotation    Middle trapezius    Lower trapezius    Elbow flexion 4- 4  Elbow extension 3+ 4+  Wrist flexion 4- 5  Wrist extension 3 5  Wrist ulnar deviation    Wrist radial deviation    Wrist pronation    Wrist supination    Grip strength 4- 4   (Blank rows = not tested)  CERVICAL SPECIAL TESTS:  Neck flexor muscle endurance test: Positive, Spurling's test: Positive, and Distraction test: Positive   [facility] Adult PT Treatment:                                                DATE: 12/06/2023  Therapeutic Exercise: Cervical AROM roation and F/E, 2x1' ea. Chin tucks Upper trap stretch 2x1' Seated Banded D2 PNF flexion, 2x12 RUE: yellow half stretch; LUE: Yellow full stretch Seated Banded D1  PNF extension, 2x 8 RUE: yellow half stretch; LUE: Yellow full stretch Seated Banded scapular retraction, Green, 2x12 Manual Therapy: Upper trap manual  release    [facility] Adult PT Treatment:  DATE: 11/29/2023  Therapeutic Exercise: Cervical AROM roation and F/E, 2x1' ea. 4 way cervical isometric 2x30s (split in 10s increments) Seated Banded D2 PNF flexion, 2x12 RUE: yellow half stretch; LUE: Yellow full stretch Seated Banded D1  PNF extension, 2x 8 RUE: yellow half stretch; LUE: Yellow full stretch Seated Banded scapular retraction, [facility] Adult PT Treatment:                                                DATE: 11/26/2023  Therapeutic Exercise: UBE 4' fwd/bkwd Scalene stretch 2x1' 4 way cervical isometric 2x30s Seated Banded D2 PNF flexion, yellow 2x12 Seated Banded D1  PNF extension, yellow 2x 8 Seated Banded scapular retraction, yellow, 2x8  Manual Therapy: Cervical distraction with suboccipital/trapezius muscle release                                                                                                             PATIENT EDUCATION:  Education details: HEP, functional cervical movement, pain management, healing times. Person educated: Patient and Niece  Education method: Explanation, Demonstration, Tactile cues, Verbal cues, and Handouts Education comprehension: verbalized understanding and returned demonstration  HOME EXERCISE PROGRAM: Access Code: [email or web address] URL: [email or web address] Date: 11/19/2023 Prepared by: Mabel Kiang  Exercises - Seated Cervical Rotation AROM  - 1 x daily - 7 x weekly - 2 sets - 15 reps - Seated Cervical Retraction Protraction AROM  - 1 x daily - 7 x weekly - 3 sets - 6-8 reps - 5s hold - Scapular Retraction with Resistance  - 1 x daily - 7 x weekly - 3 sets - 8-10 reps - 2s hold  ASSESSMENT:  CLINICAL IMPRESSION: Pt attended physical therapy session for continuation of treatment regarding cervicalgia and radiculopathy. Pt tolerated treatment fairly and demonstrated improvement with overhead mobility and  strength throughout all B Shoulder ROM . Some difficulties maintained with neck pain, especially with cervical extension and B rotation. Pt showed good benefit from manual therapy to release tension in upper trap, scalenes and rotator cuff. Pt required minimal tactile cues for safe and appropriate performance of today's activities.      11/19/2023 Eval impression: Patient is a 43 y.o. female who was seen today for physical therapy evaluation and treatment for cervical pain with radiating symptoms down BUE with R>L. Pt  demonstrated limited cervical ROM in all directions, impaired strength and sensation along the RUE with c6 myotome/dermatone being the most affected. Pt noted some tingling symptoms going into L hand at random however it wasn't as bad as the R. Pt is moderately painful with all cervical/shoulder motions, however, tolerated treatment well with a reduction in symptoms and no change in pain. Pt requires precise tactile and verbal cuing due to visual impairments. Pt requires the intervention of skilled outpatient physical therapy to address the aforementioned deficits and progress the patient towards a functional level in line with therapeutic  goals.  OBJECTIVE IMPAIRMENTS: decreased mobility, impaired sensation, impaired tone, impaired vision/preception, and pain.   ACTIVITY LIMITATIONS: carrying, lifting, bending, squatting, and sleeping  PARTICIPATION LIMITATIONS: cleaning, interpersonal relationship, community activity, and occupation  PERSONAL FACTORS: Fitness, Past/current experiences, and 1-2 comorbidities: Blindness, DM2  are also affecting patient's functional outcome.   REHAB POTENTIAL: Fair    CLINICAL DECISION MAKING: Evolving/moderate complexity  EVALUATION COMPLEXITY: Moderate   GOALS: Goals reviewed with patient? Yes  SHORT TERM GOALS: Target date: 12/10/2023   Improve cervical rotation to 55 degrees B with </= 6/10 pain to improve activity tolerance involving  cervical motion Baseline: 40 Goal status: INITIAL  2.  Pt will improve RUE gross strength to 4/5 to demonstrate improvement with contractility and functionality of RUE Baseline: 4- Goal status: INITIAL   LONG TERM GOALS: Target date: 12/31/2023    Pt  will improve FOTO score from 38 to 57% to demonstrate improvement in overall function in relation to current impairments Baseline:  Goal status: INITIAL  2.  Pt will be independent with HEP to demonstrate competency necessary for at home management of symptoms and rehabilitation Baseline:  Goal status: INITIAL   PLAN:  PT FREQUENCY: 2x/week  PT DURATION: 6 weeks  PLANNED INTERVENTIONS: 97110-Therapeutic exercises, 97530- Therapeutic activity, [id number]- Neuromuscular re-education, 97535- Self Care, and [id number]- Manual therapy  PLAN FOR NEXT SESSION: review HEP,  progress PNF pattern quality and ensure HEP independence progress.  Mabel Kiang, PT, DPT 12/10/2023, 8:21 AM

## 2023-12-13 ENCOUNTER — Ambulatory Visit: Payer: 59

## 2023-12-13 DIAGNOSIS — R293 Abnormal posture: Secondary | ICD-10-CM

## 2023-12-13 DIAGNOSIS — M6281 Muscle weakness (generalized): Secondary | ICD-10-CM

## 2023-12-13 DIAGNOSIS — M5412 Radiculopathy, cervical region: Secondary | ICD-10-CM | POA: Diagnosis not present

## 2023-12-13 NOTE — Therapy (Signed)
OUTPATIENT PHYSICAL THERAPY TREATMENT NOTE   Patient Name: Monica Huang MRN: [id number] DOB:[birth year], 43 y.o., female Today's Date: 12/13/2023  END OF SESSION:  PT End of Session - 12/13/23 1524     Visit Number 5    Number of Visits 13    Date for PT Re-Evaluation 12/30/22    Authorization Type [employer]    PT Start Time 1530    PT Stop Time 1610    PT Time Calculation (min) 40 min    Activity Tolerance Patient tolerated treatment well    Behavior During Therapy WFL for tasks assessed/performed                Past Medical History:  Diagnosis Date   Ambulates with cane    Chronic back pain    Chronic leg pain    Diabetes mellitus without complication (HCC)    Type II - no meds   Hyperlipidemia    diet controlle - no meds   Legally blind    uses cane - some limited vision   Macular degeneration    patient denies this dx  (dr street dx)   Pseudotumor cerebri syndrome 2005   shunt placed- and legally blind   Sciatica    Sleep apnea    "Mild" - has appt on 06/23/20 to see about cpap machine   Wears partial dentures    upper and lower   Past Surgical History:  Procedure Laterality Date   ANTERIOR CERVICAL DECOMP/DISCECTOMY FUSION N/A 05/25/2020   Procedure: ANTERIOR CERVICAL DISCECTOMY AND FUSION CERVICAL FIVE THROUGH CERVICAL SIX WITH PLATES, SCREWS, ALLOGRAFT AND LOCAL BONE GRAFT, VIVIGEN;  Surgeon: Kerrin Champagne, MD;  Location: [facility] OR;  Service: Orthopedics;  Laterality: N/A;   CSF SHUNT     2 revisions   MULTIPLE EXTRACTIONS WITH ALVEOLOPLASTY Bilateral 11/02/2017   Procedure: MULTIPLE EXTRACTION;  Surgeon: Ocie Doyne, DDS;  Location: [facility] OR;  Service: Oral Surgery;  Laterality: Bilateral;   Patient Active Problem List   Diagnosis Date Noted   Pain of right hip 10/14/2023   Dyshydrosis 06/18/2023   Encounter for long-term (current) use of NSAIDs 06/18/2023   Thoracic radiculopathy 05/25/2022   Abnormal uterine bleeding 05/11/2022   Low back pain  potentially associated with radiculopathy 02/20/2022   Diabetes mellitus without complication (HCC)    S/P cervical spinal fusion 05/26/2020   Herniation of cervical intervertebral disc with radiculopathy 05/25/2020    Class: Chronic   Chronic pain of both knees 10/23/2019   Carpal tunnel syndrome 02/03/2019   Anxiety and depression 06/10/2016   Essential hypertension, benign 05/10/2015   Mechanical complication-ventricular(CSF) communicating shunt (HCC) 03/05/2013   Hidradenitis suppurativa 10/15/2012   Tobacco abuse 01/03/2012   Legally blind 01/03/2012   Obesity 01/03/2012    PCP: Alicia Amel, MD  REFERRING PROVIDER: London Sheer, MD  REFERRING DIAG: Radiculopathy, cervical region [phone]   THERAPY DIAG:  Radiculopathy, cervical region  Abnormal posture  Muscle weakness (generalized)  Rationale for Evaluation and Treatment: Rehabilitation  ONSET DATE: 09/27/2023  SUBJECTIVE:  SUBJECTIVE STATEMENT: Reports continued symptoms in cervical spine, symptoms improved for an hour after PT sessions but then return.  Describes L sided cervical symptoms resembling SCM spasm.  Finds it hard to obtain a comfortable position to sleep at night.   PERTINENT HISTORY:  DM 2 Pseudotumorcerbra (2005) Blindness   PAIN:  Are you having pain? "NPRS scale: 7/10","Pain location: B shoulders/neck ,"Pain description: stabbing,"Aggravating factors: R rotation, flexion","Relieving factors: neutral  PRECAUTIONS: None  RED FLAGS: Cervical red flags: none      WEIGHT BEARING RESTRICTIONS: No  FALLS:  Has patient fallen in last 6 months? No  LIVING ENVIRONMENT: Lives with: lives with their family Lives in: House/apartment Stairs: Yes: External: 20 steps; on right going up Has  following equipment at home:  grab rail in shower, Red tipped cane (blindness)  OCCUPATION: not currently employed  PLOF: Independent  PATIENT GOALS: decrease neck pain and improve function  NEXT MD VISIT: 3 Month follow up from the 10/31/2023 visit  OBJECTIVE:  Note: Objective measures were completed at Evaluation unless otherwise noted.  DIAGNOSTIC FINDINGS:  Sterling compression/distraction:+   PATIENT SURVEYS:  FOTO Current:38%  Predicted 57%  COGNITION: Overall cognitive status: Within functional limits for tasks assessed  SENSATION: Light touch: Impaired   POSTURE: rounded shoulders, forward head, and increased thoracic kyphosis  PALPATION: Tenderness to B trapezius, rhomboids and suboccipital musculature  CERVICAL ROM:   Active ROM A/PROM (deg) Eval 11/19/2023 AROM 12/13/23  Flexion 50   Extension 40 25% P!  Right lateral flexion  25%  Left lateral flexion  50%  Right rotation 40 75%  Left rotation 45 50%   P! Cervicothoracic region  UPPER EXTREMITY ROM:  Active ROM Right eval Left eval  Shoulder flexion 140 140  Shoulder extension    Shoulder abduction    Shoulder adduction    Shoulder extension    Shoulder internal rotation    Shoulder external rotation    Elbow flexion    Elbow extension    Wrist flexion    Wrist extension    Wrist ulnar deviation    Wrist radial deviation    Wrist pronation    Wrist supination     (Blank rows = not tested)  UPPER EXTREMITY MMT:  MMT Right eval Left eval  Shoulder flexion    Shoulder extension    Shoulder abduction    Shoulder adduction    Shoulder extension    Shoulder internal rotation    Shoulder external rotation    Middle trapezius    Lower trapezius    Elbow flexion 4- 4  Elbow extension 3+ 4+  Wrist flexion 4- 5  Wrist extension 3 5  Wrist ulnar deviation    Wrist radial deviation    Wrist pronation    Wrist supination    Grip strength 4- 4   (Blank rows = not  tested)  CERVICAL SPECIAL TESTS:  Neck flexor muscle endurance test: Positive, Spurling's test: Positive, and Distraction test: Positive 12/12/22 positive LUE paresthesias with ULTT, no bias tested  [facility] Adult PT Treatment:                                                DATE: 12/13/23  Manual Therapy: L pec minor release 2 min Manual scalene stretch 30s x3 L first rib mob to tolerance 10x, combined with MWM into L  shoulder flexion 5x Manual L SCM stretch 30s Cervical ROM re-assess/PROM  [facility] Adult PT Treatment:                                                DATE: 12/06/2023  Therapeutic Exercise: Cervical AROM roation and F/E, 2x1' ea. Chin tucks Upper trap stretch 2x1' Seated Banded D2 PNF flexion, 2x12 RUE: yellow half stretch; LUE: Yellow full stretch Seated Banded D1  PNF extension, 2x 8 RUE: yellow half stretch; LUE: Yellow full stretch Seated Banded scapular retraction, Green, 2x12 Manual Therapy: Upper trap manual release    [facility] Adult PT Treatment:                                                DATE: 11/29/2023  Therapeutic Exercise: Cervical AROM roation and F/E, 2x1' ea. 4 way cervical isometric 2x30s (split in 10s increments) Seated Banded D2 PNF flexion, 2x12 RUE: yellow half stretch; LUE: Yellow full stretch Seated Banded D1  PNF extension, 2x 8 RUE: yellow half stretch; LUE: Yellow full stretch Seated Banded scapular retraction, Marlyn Corporal Adult PT Treatment:                                                DATE: 11/26/2023  Therapeutic Exercise: UBE 4' fwd/bkwd Scalene stretch 2x1' 4 way cervical isometric 2x30s Seated Banded D2 PNF flexion, yellow 2x12 Seated Banded D1  PNF extension, yellow 2x 8 Seated Banded scapular retraction, yellow, 2x8  Manual Therapy: Cervical distraction with suboccipital/trapezius muscle release                                                                                                             PATIENT  EDUCATION:  Education details: HEP, functional cervical movement, pain management, healing times. Person educated: Patient and Niece  Education method: Explanation, Demonstration, Tactile cues, Verbal cues, and Handouts Education comprehension: verbalized understanding and returned demonstration  HOME EXERCISE PROGRAM: Access Code: [email or web address] URL: [email or web address] Date: 11/19/2023 Prepared by: Sheliah Plane  Exercises - Seated Cervical Rotation AROM  - 1 x daily - 7 x weekly - 2 sets - 15 reps - Seated Cervical Retraction Protraction AROM  - 1 x daily - 7 x weekly - 3 sets - 6-8 reps - 5s hold - Scapular Retraction with Resistance  - 1 x daily - 7 x weekly - 3 sets - 8-10 reps - 2s hold  ASSESSMENT:  CLINICAL IMPRESSION: Some gains in cervical mobility noted.  Presents with S&S of mild TOS, L SCM and pec minor tigtness/TrP.  Treatment session focused on L scalene stretch and manual techniques including first  rib mob, MWMs and sustained stretch.   Pt attended physical therapy session for continuation of treatment regarding cervicalgia and radiculopathy. Pt tolerated treatment fairly and demonstrated improvement with overhead mobility and strength throughout all B Shoulder ROM . Some difficulties maintained with neck pain, especially with cervical extension and B rotation. Pt showed good benefit from manual therapy to release tension in upper trap, scalenes and rotator cuff. Pt required minimal tactile cues for safe and appropriate performance of today's activities.      11/19/2023 Eval impression: Patient is a 43 y.o. female who was seen today for physical therapy evaluation and treatment for cervical pain with radiating symptoms down BUE with R>L. Pt  demonstrated limited cervical ROM in all directions, impaired strength and sensation along the RUE with c6 myotome/dermatone being the most affected. Pt noted some tingling symptoms going into L hand at "random" however it  wasn't as bad as the R. Pt is moderately painful with all cervical/shoulder motions, however, tolerated treatment well with a reduction in symptoms and no change in pain. Pt requires precise tactile and verbal cuing due to visual impairments. Pt requires the intervention of skilled outpatient physical therapy to address the aforementioned deficits and progress the patient towards a functional level in line with therapeutic goals.  OBJECTIVE IMPAIRMENTS: decreased mobility, impaired sensation, impaired tone, impaired vision/preception, and pain.   ACTIVITY LIMITATIONS: carrying, lifting, bending, squatting, and sleeping  PARTICIPATION LIMITATIONS: cleaning, interpersonal relationship, community activity, and occupation  PERSONAL FACTORS: Fitness, Past/current experiences, and 1-2 comorbidities: Blindness, DM2  are also affecting patient's functional outcome.   REHAB POTENTIAL: Fair    CLINICAL DECISION MAKING: Evolving/moderate complexity  EVALUATION COMPLEXITY: Moderate   GOALS: Goals reviewed with patient? Yes  SHORT TERM GOALS: Target date: 12/10/2023   Improve cervical rotation to 55 degrees B with </= 6/10 pain to improve activity tolerance involving cervical motion Baseline: 40 Goal status: INITIAL  2.  Pt will improve RUE gross strength to 4/5 to demonstrate improvement with contractility and functionality of RUE Baseline: 4- Goal status: INITIAL   LONG TERM GOALS: Target date: 12/31/2023    Pt  will improve FOTO score from 38 to 57% to demonstrate improvement in overall function in relation to current impairments Baseline:  Goal status: INITIAL  2.  Pt will be independent with HEP to demonstrate competency necessary for at home management of symptoms and rehabilitation Baseline:  Goal status: INITIAL   PLAN:  PT FREQUENCY: 2x/week  PT DURATION: 6 weeks  PLANNED INTERVENTIONS: 97110-Therapeutic exercises, 97530- Therapeutic activity, [id number]- Neuromuscular  re-education, 97535- Self Care, and [id number]- Manual therapy  PLAN FOR NEXT SESSION: review HEP,  progress PNF pattern quality and ensure HEP independence progress.  Sheliah Plane, PT, DPT 12/13/2023, 4:24 PM

## 2023-12-18 ENCOUNTER — Telehealth: Payer: Self-pay

## 2023-12-18 ENCOUNTER — Ambulatory Visit: Payer: 59

## 2023-12-18 NOTE — Telephone Encounter (Signed)
LVM for patient regarding missed appointment. Confirmed next appointment time and reminded of clinic attendance policy. Patient will only be able to schedule 1 appointment at a time going forward.  2nd no-show  Monica Huang, [place] 12/18/23 2:29 PM

## 2023-12-19 NOTE — Therapy (Deleted)
OUTPATIENT PHYSICAL THERAPY TREATMENT NOTE   Patient Name: Monica Huang MRN: [id number] DOB:January 14, 1981, 43 y.o., female Today's Date: 12/19/2023  END OF SESSION:       Past Medical History:  Diagnosis Date   Ambulates with cane    Chronic back pain    Chronic leg pain    Diabetes mellitus without complication (HCC)    Type II - no meds   Hyperlipidemia    diet controlle - no meds   Legally blind    uses cane - some limited vision   Macular degeneration    patient denies this dx  (dr street dx)   Pseudotumor cerebri syndrome 2005   shunt placed- and legally blind   Sciatica    Sleep apnea    "Mild" - has appt on 06/23/20 to see about cpap machine   Wears partial dentures    upper and lower   Past Surgical History:  Procedure Laterality Date   ANTERIOR CERVICAL DECOMP/DISCECTOMY FUSION N/A 05/25/2020   Procedure: ANTERIOR CERVICAL DISCECTOMY AND FUSION CERVICAL FIVE THROUGH CERVICAL SIX WITH PLATES, SCREWS, ALLOGRAFT AND LOCAL BONE GRAFT, VIVIGEN;  Surgeon: Kerrin Champagne, MD;  Location: [facility] OR;  Service: Orthopedics;  Laterality: N/A;   CSF SHUNT     2 revisions   MULTIPLE EXTRACTIONS WITH ALVEOLOPLASTY Bilateral 11/02/2017   Procedure: MULTIPLE EXTRACTION;  Surgeon: Ocie Doyne, DDS;  Location: [facility] OR;  Service: Oral Surgery;  Laterality: Bilateral;   Patient Active Problem List   Diagnosis Date Noted   Pain of right hip 10/14/2023   Dyshydrosis 06/18/2023   Encounter for long-term (current) use of NSAIDs 06/18/2023   Thoracic radiculopathy 05/25/2022   Abnormal uterine bleeding 05/11/2022   Low back pain potentially associated with radiculopathy 02/20/2022   Diabetes mellitus without complication (HCC)    S/P cervical spinal fusion 05/26/2020   Herniation of cervical intervertebral disc with radiculopathy 05/25/2020    Class: Chronic   Chronic pain of both knees 10/23/2019   Carpal tunnel syndrome 02/03/2019   Anxiety and depression 06/10/2016   Essential  hypertension, benign 05/10/2015   Mechanical complication-ventricular(CSF) communicating shunt (HCC) 03/05/2013   Hidradenitis suppurativa 10/15/2012   Tobacco abuse 01/03/2012   Legally blind 01/03/2012   Obesity 01/03/2012    PCP: Alicia Amel, MD  REFERRING PROVIDER: London Sheer, MD  REFERRING DIAG: Radiculopathy, cervical region [M54.12]   THERAPY DIAG:  No diagnosis found.  Rationale for Evaluation and Treatment: Rehabilitation  ONSET DATE: 09/27/2023  SUBJECTIVE:  SUBJECTIVE STATEMENT: Reports continued symptoms in cervical spine, symptoms improved for an hour after PT sessions but then return.  Describes L sided cervical symptoms resembling SCM spasm.  Finds it hard to obtain a comfortable position to sleep at night.   PERTINENT HISTORY:  DM 2 Pseudotumorcerbra (2005) Blindness   PAIN:  Are you having pain? "NPRS scale: 7/10","Pain location: B shoulders/neck ,"Pain description: stabbing,"Aggravating factors: R rotation, flexion","Relieving factors: neutral  PRECAUTIONS: None  RED FLAGS: Cervical red flags: none      WEIGHT BEARING RESTRICTIONS: No  FALLS:  Has patient fallen in last 6 months? No  LIVING ENVIRONMENT: Lives with: lives with their family Lives in: House/apartment Stairs: Yes: External: 20 steps; on right going up Has following equipment at home:  grab rail in shower, Red tipped cane (blindness)  OCCUPATION: not currently employed  PLOF: Independent  PATIENT GOALS: decrease neck pain and improve function  NEXT MD VISIT: 3 Month follow up from the 10/31/2023 visit  OBJECTIVE:  Note: Objective measures were completed at Evaluation unless otherwise noted.  DIAGNOSTIC FINDINGS:  Sterling compression/distraction:+   PATIENT SURVEYS:   FOTO Current:38%  Predicted 57%  COGNITION: Overall cognitive status: Within functional limits for tasks assessed  SENSATION: Light touch: Impaired   POSTURE: rounded shoulders, forward head, and increased thoracic kyphosis  PALPATION: Tenderness to B trapezius, rhomboids and suboccipital musculature  CERVICAL ROM:   Active ROM A/PROM (deg) Eval 11/19/2023 AROM 12/13/23  Flexion 50   Extension 40 25% P!  Right lateral flexion  25%  Left lateral flexion  50%  Right rotation 40 75%  Left rotation 45 50%   P! Cervicothoracic region  UPPER EXTREMITY ROM:  Active ROM Right eval Left eval  Shoulder flexion 140 140  Shoulder extension    Shoulder abduction    Shoulder adduction    Shoulder extension    Shoulder internal rotation    Shoulder external rotation    Elbow flexion    Elbow extension    Wrist flexion    Wrist extension    Wrist ulnar deviation    Wrist radial deviation    Wrist pronation    Wrist supination     (Blank rows = not tested)  UPPER EXTREMITY MMT:  MMT Right eval Left eval  Shoulder flexion    Shoulder extension    Shoulder abduction    Shoulder adduction    Shoulder extension    Shoulder internal rotation    Shoulder external rotation    Middle trapezius    Lower trapezius    Elbow flexion 4- 4  Elbow extension 3+ 4+  Wrist flexion 4- 5  Wrist extension 3 5  Wrist ulnar deviation    Wrist radial deviation    Wrist pronation    Wrist supination    Grip strength 4- 4   (Blank rows = not tested)  CERVICAL SPECIAL TESTS:  Neck flexor muscle endurance test: Positive, Spurling's test: Positive, and Distraction test: Positive 12/12/22 positive LUE paresthesias with ULTT, no bias tested  [facility] Adult PT Treatment:                                                DATE: 12/13/23  Manual Therapy: L pec minor release 2 min Manual scalene stretch 30s x3 L first rib mob to tolerance 10x, combined with MWM into L shoulder  flexion  5x Manual L SCM stretch 30s Cervical ROM re-assess/PROM  [facility] Adult PT Treatment:                                                DATE: 12/06/2023  Therapeutic Exercise: Cervical AROM roation and F/E, 2x1' ea. Chin tucks Upper trap stretch 2x1' Seated Banded D2 PNF flexion, 2x12 RUE: yellow half stretch; LUE: Yellow full stretch Seated Banded D1  PNF extension, 2x 8 RUE: yellow half stretch; LUE: Yellow full stretch Seated Banded scapular retraction, Green, 2x12 Manual Therapy: Upper trap manual release    [facility] Adult PT Treatment:                                                DATE: 11/29/2023  Therapeutic Exercise: Cervical AROM roation and F/E, 2x1' ea. 4 way cervical isometric 2x30s (split in 10s increments) Seated Banded D2 PNF flexion, 2x12 RUE: yellow half stretch; LUE: Yellow full stretch Seated Banded D1  PNF extension, 2x 8 RUE: yellow half stretch; LUE: Yellow full stretch Seated Banded scapular retraction, Marlyn Corporal Adult PT Treatment:                                                DATE: 11/26/2023  Therapeutic Exercise: UBE 4' fwd/bkwd Scalene stretch 2x1' 4 way cervical isometric 2x30s Seated Banded D2 PNF flexion, yellow 2x12 Seated Banded D1  PNF extension, yellow 2x 8 Seated Banded scapular retraction, yellow, 2x8  Manual Therapy: Cervical distraction with suboccipital/trapezius muscle release                                                                                                             PATIENT EDUCATION:  Education details: HEP, functional cervical movement, pain management, healing times. Person educated: Patient and Niece  Education method: Explanation, Demonstration, Tactile cues, Verbal cues, and Handouts Education comprehension: verbalized understanding and returned demonstration  HOME EXERCISE PROGRAM: Access Code: [email or web address] URL: [email or web address] Date: 11/19/2023 Prepared by: Sheliah Plane  Exercises - Seated Cervical Rotation AROM  - 1 x daily - 7 x weekly - 2 sets - 15 reps - Seated Cervical Retraction Protraction AROM  - 1 x daily - 7 x weekly - 3 sets - 6-8 reps - 5s hold - Scapular Retraction with Resistance  - 1 x daily - 7 x weekly - 3 sets - 8-10 reps - 2s hold  ASSESSMENT:  CLINICAL IMPRESSION: Some gains in cervical mobility noted.  Presents with S&S of mild TOS, L SCM and pec minor tigtness/TrP.  Treatment session focused on L scalene stretch and manual techniques including first  rib mob, MWMs and sustained stretch.   Pt attended physical therapy session for continuation of treatment regarding cervicalgia and radiculopathy. Pt tolerated treatment fairly and demonstrated improvement with overhead mobility and strength throughout all B Shoulder ROM . Some difficulties maintained with neck pain, especially with cervical extension and B rotation. Pt showed good benefit from manual therapy to release tension in upper trap, scalenes and rotator cuff. Pt required minimal tactile cues for safe and appropriate performance of today's activities.      11/19/2023 Eval impression: Patient is a 43 y.o. female who was seen today for physical therapy evaluation and treatment for cervical pain with radiating symptoms down BUE with R>L. Pt  demonstrated limited cervical ROM in all directions, impaired strength and sensation along the RUE with c6 myotome/dermatone being the most affected. Pt noted some tingling symptoms going into L hand at "random" however it wasn't as bad as the R. Pt is moderately painful with all cervical/shoulder motions, however, tolerated treatment well with a reduction in symptoms and no change in pain. Pt requires precise tactile and verbal cuing due to visual impairments. Pt requires the intervention of skilled outpatient physical therapy to address the aforementioned deficits and progress the patient towards a functional level in line with therapeutic  goals.  OBJECTIVE IMPAIRMENTS: decreased mobility, impaired sensation, impaired tone, impaired vision/preception, and pain.   ACTIVITY LIMITATIONS: carrying, lifting, bending, squatting, and sleeping  PARTICIPATION LIMITATIONS: cleaning, interpersonal relationship, community activity, and occupation  PERSONAL FACTORS: Fitness, Past/current experiences, and 1-2 comorbidities: Blindness, DM2  are also affecting patient's functional outcome.   REHAB POTENTIAL: Fair    CLINICAL DECISION MAKING: Evolving/moderate complexity  EVALUATION COMPLEXITY: Moderate   GOALS: Goals reviewed with patient? Yes  SHORT TERM GOALS: Target date: 12/10/2023   Improve cervical rotation to 55 degrees B with </= 6/10 pain to improve activity tolerance involving cervical motion Baseline: 40 Goal status: INITIAL  2.  Pt will improve RUE gross strength to 4/5 to demonstrate improvement with contractility and functionality of RUE Baseline: 4- Goal status: INITIAL   LONG TERM GOALS: Target date: 12/31/2023    Pt  will improve FOTO score from 38 to 57% to demonstrate improvement in overall function in relation to current impairments Baseline:  Goal status: INITIAL  2.  Pt will be independent with HEP to demonstrate competency necessary for at home management of symptoms and rehabilitation Baseline:  Goal status: INITIAL   PLAN:  PT FREQUENCY: 2x/week  PT DURATION: 6 weeks  PLANNED INTERVENTIONS: 97110-Therapeutic exercises, 97530- Therapeutic activity, [id number]- Neuromuscular re-education, 97535- Self Care, and 16109- Manual therapy  PLAN FOR NEXT SESSION: review HEP,  progress PNF pattern quality and ensure HEP independence progress.  Sheliah Plane, PT, DPT 12/19/2023, 1:30 PM

## 2023-12-20 ENCOUNTER — Telehealth: Payer: Self-pay

## 2023-12-20 ENCOUNTER — Ambulatory Visit: Payer: 59

## 2023-12-20 NOTE — Telephone Encounter (Signed)
TC due to missed visit.  VM left informing patient she had no further PT visits scheduled and left clinic number to call if she needed to reschedule.

## 2023-12-24 ENCOUNTER — Encounter: Payer: Self-pay | Admitting: Physical Therapy

## 2023-12-24 ENCOUNTER — Ambulatory Visit: Payer: 59 | Admitting: Physical Therapy

## 2023-12-24 DIAGNOSIS — M6281 Muscle weakness (generalized): Secondary | ICD-10-CM

## 2023-12-24 DIAGNOSIS — M5412 Radiculopathy, cervical region: Secondary | ICD-10-CM

## 2023-12-24 DIAGNOSIS — R293 Abnormal posture: Secondary | ICD-10-CM

## 2023-12-24 NOTE — Therapy (Signed)
OUTPATIENT PHYSICAL THERAPY TREATMENT NOTE   Patient Name: Monica Huang MRN: [id number] DOB:Jul 08, 1981, 43 y.o., female Today's Date: 12/24/2023  END OF SESSION:  PT End of Session - 12/24/23 1210     Visit Number 6    Number of Visits 13    Date for PT Re-Evaluation 01/14/24    PT Start Time 1145    PT Stop Time 1210    PT Time Calculation (min) 25 min    Activity Tolerance Patient tolerated treatment well    Behavior During Therapy WFL for tasks assessed/performed                 Past Medical History:  Diagnosis Date   Ambulates with cane    Chronic back pain    Chronic leg pain    Diabetes mellitus without complication (HCC)    Type II - no meds   Hyperlipidemia    diet controlle - no meds   Legally blind    uses cane - some limited vision   Macular degeneration    patient denies this dx  (dr street dx)   Pseudotumor cerebri syndrome 2005   shunt placed- and legally blind   Sciatica    Sleep apnea    "Mild" - has appt on 06/23/20 to see about cpap machine   Wears partial dentures    upper and lower   Past Surgical History:  Procedure Laterality Date   ANTERIOR CERVICAL DECOMP/DISCECTOMY FUSION N/A 05/25/2020   Procedure: ANTERIOR CERVICAL DISCECTOMY AND FUSION CERVICAL FIVE THROUGH CERVICAL SIX WITH PLATES, SCREWS, ALLOGRAFT AND LOCAL BONE GRAFT, VIVIGEN;  Surgeon: Kerrin Champagne, MD;  Location: [facility] OR;  Service: Orthopedics;  Laterality: N/A;   CSF SHUNT     2 revisions   MULTIPLE EXTRACTIONS WITH ALVEOLOPLASTY Bilateral 11/02/2017   Procedure: MULTIPLE EXTRACTION;  Surgeon: Ocie Doyne, DDS;  Location: [facility] OR;  Service: Oral Surgery;  Laterality: Bilateral;   Patient Active Problem List   Diagnosis Date Noted   Pain of right hip 10/14/2023   Dyshydrosis 06/18/2023   Encounter for long-term (current) use of NSAIDs 06/18/2023   Thoracic radiculopathy 05/25/2022   Abnormal uterine bleeding 05/11/2022   Low back pain potentially associated with  radiculopathy 02/20/2022   Diabetes mellitus without complication (HCC)    S/P cervical spinal fusion 05/26/2020   Herniation of cervical intervertebral disc with radiculopathy 05/25/2020    Class: Chronic   Chronic pain of both knees 10/23/2019   Carpal tunnel syndrome 02/03/2019   Anxiety and depression 06/10/2016   Essential hypertension, benign 05/10/2015   Mechanical complication-ventricular(CSF) communicating shunt (HCC) 03/05/2013   Hidradenitis suppurativa 10/15/2012   Tobacco abuse 01/03/2012   Legally blind 01/03/2012   Obesity 01/03/2012    PCP: Alicia Amel, MD  REFERRING PROVIDER: London Sheer, MD  REFERRING DIAG: Radiculopathy, cervical region [id number]   THERAPY DIAG:  Radiculopathy, cervical region  Abnormal posture  Muscle weakness (generalized)  Rationale for Evaluation and Treatment: Rehabilitation  ONSET DATE: 09/27/2023  SUBJECTIVE:  SUBJECTIVE STATEMENT: Pt reports compliance with HEP, continues to have BUE symptoms, thought improving overall.   PERTINENT HISTORY:  DM 2 Pseudotumorcerbra (2005) Blindness   PAIN:  Are you having pain? "NPRS scale: 7/10","Pain location: B shoulders/neck ,"Pain description: stabbing,"Aggravating factors: R rotation, flexion","Relieving factors: neutral  PRECAUTIONS: None  RED FLAGS: Cervical red flags: none      WEIGHT BEARING RESTRICTIONS: No  FALLS:  Has patient fallen in last 6 months? No  LIVING ENVIRONMENT: Lives with: lives with their family Lives in: House/apartment Stairs: Yes: External: 20 steps; on right going up Has following equipment at home:  grab rail in shower, Red tipped cane (blindness)  OCCUPATION: not currently employed  PLOF: Independent  PATIENT GOALS: decrease neck pain and  improve function  NEXT MD VISIT: 3 Month follow up from the 10/31/2023 visit  OBJECTIVE:  Note: Objective measures were completed at Evaluation unless otherwise noted.  DIAGNOSTIC FINDINGS:  Sterling compression/distraction:+   PATIENT SURVEYS:  FOTO Current:38%  Predicted 57%  12/24/2023: 53%  COGNITION: Overall cognitive status: Within functional limits for tasks assessed  SENSATION: Light touch: Impaired   POSTURE: rounded shoulders, forward head, and increased thoracic kyphosis  PALPATION: Tenderness to B trapezius, rhomboids and suboccipital musculature  CERVICAL ROM:   Active ROM A/PROM (deg) Eval 11/19/2023 AROM 12/13/23  Flexion 50   Extension 40 25% P!  Right lateral flexion  25%  Left lateral flexion  50%  Right rotation 40 75%  Left rotation 45 50%   P! Cervicothoracic region  UPPER EXTREMITY ROM:  Active ROM Right eval Left eval  Shoulder flexion 140 140  Shoulder extension    Shoulder abduction    Shoulder adduction    Shoulder extension    Shoulder internal rotation    Shoulder external rotation    Elbow flexion    Elbow extension    Wrist flexion    Wrist extension    Wrist ulnar deviation    Wrist radial deviation    Wrist pronation    Wrist supination     (Blank rows = not tested)  UPPER EXTREMITY MMT:  MMT Right eval Left eval  Shoulder flexion    Shoulder extension    Shoulder abduction    Shoulder adduction    Shoulder extension    Shoulder internal rotation    Shoulder external rotation    Middle trapezius    Lower trapezius    Elbow flexion 4- 4  Elbow extension 3+ 4+  Wrist flexion 4- 5  Wrist extension 3 5  Wrist ulnar deviation    Wrist radial deviation    Wrist pronation    Wrist supination    Grip strength 4- 4   (Blank rows = not tested)  CERVICAL SPECIAL TESTS:  Neck flexor muscle endurance test: Positive, Spurling's test: Positive, and Distraction test: Positive 12/12/22 positive LUE paresthesias with  ULTT, no bias tested    [facility] Adult PT Treatment:                                                DATE: 12/24/2023  Therapeutic Activity: Re-evaluative measurements POC discussion HEP review   [facility] Adult PT Treatment:  DATE: 12/13/23  Manual Therapy: L pec minor release 2 min Manual scalene stretch 30s x3 L first rib mob to tolerance 10x, combined with MWM into L shoulder flexion 5x Manual L SCM stretch 30s Cervical ROM re-assess/PROM                                                                                                              PATIENT EDUCATION:  Education details: HEP, functional cervical movement, pain management, healing times. Person educated: Patient and Niece  Education method: Explanation, Demonstration, Tactile cues, Verbal cues, and Handouts Education comprehension: verbalized understanding and returned demonstration  HOME EXERCISE PROGRAM: Access Code: [email or web address] URL: [email or web address] Date: 11/19/2023 Prepared by: Sheliah Plane  Exercises - Seated Cervical Rotation AROM  - 1 x daily - 7 x weekly - 2 sets - 15 reps - Seated Cervical Retraction Protraction AROM  - 1 x daily - 7 x weekly - 3 sets - 6-8 reps - 5s hold - Scapular Retraction with Resistance  - 1 x daily - 7 x weekly - 3 sets - 8-10 reps - 2s hold  ASSESSMENT:  CLINICAL IMPRESSION:  Pt attended physical therapy session for continuation of treatment regarding re-evaluation of neck pain and mobility. Pt showed has shown significant  improvement with perceived disability as well as BUE strength. Pt continues to progress in 2 therapeutic goals and has met 2 others as of this session. Therapeutic focus should retain on Cervical AROM and neck stability. Pt acknowledged education regarding current attendance record and how it relates to clinic policy.    11/19/2023 Eval impression: Patient is a 43 y.o. female who was seen today for  physical therapy evaluation and treatment for cervical pain with radiating symptoms down BUE with R>L. Pt  demonstrated limited cervical ROM in all directions, impaired strength and sensation along the RUE with c6 myotome/dermatone being the most affected. Pt noted some tingling symptoms going into L hand at "random" however it wasn't as bad as the R. Pt is moderately painful with all cervical/shoulder motions, however, tolerated treatment well with a reduction in symptoms and no change in pain. Pt requires precise tactile and verbal cuing due to visual impairments. Pt requires the intervention of skilled outpatient physical therapy to address the aforementioned deficits and progress the patient towards a functional level in line with therapeutic goals.  OBJECTIVE IMPAIRMENTS: decreased mobility, impaired sensation, impaired tone, impaired vision/preception, and pain.   ACTIVITY LIMITATIONS: carrying, lifting, bending, squatting, and sleeping  PARTICIPATION LIMITATIONS: cleaning, interpersonal relationship, community activity, and occupation  PERSONAL FACTORS: Fitness, Past/current experiences, and 1-2 comorbidities: Blindness, DM2  are also affecting patient's functional outcome.   REHAB POTENTIAL: Fair    CLINICAL DECISION MAKING: Evolving/moderate complexity  EVALUATION COMPLEXITY: Moderate   GOALS: Goals reviewed with patient? Yes  SHORT TERM GOALS: Target date: 12/10/2023   Improve cervical rotation to 55 degrees B with </= 6/10 pain to improve activity tolerance involving cervical motion Baseline: 40 Goal status: Progressing 12/24/2023  2.  Pt will improve RUE gross strength to  4/5 to demonstrate improvement with contractility and functionality of RUE Baseline: 4- Goal status: MET 12/24/2023   LONG TERM GOALS: Target date: 12/31/2023   Pt  will improve FOTO score from 38 to 57% to demonstrate improvement in overall function in relation to current impairments Baseline:  Goal  status: INITIAL  2.  Pt will be independent with HEP to demonstrate competency necessary for at home management of symptoms and rehabilitation Baseline:  Goal status: MET 12/24/2023   PLAN:  PT FREQUENCY: 2x/week  PT DURATION: 6 weeks  PLANNED INTERVENTIONS: 97110-Therapeutic exercises, 97530- Therapeutic activity, 97112- Neuromuscular re-education, 97535- Self Care, and 16109- Manual therapy  PLAN FOR NEXT SESSION: review HEP,  progress PNF pattern quality and ensure HEP independence progress.  Sheliah Plane, PT, DPT 12/24/2023, 12:12 PM

## 2023-12-26 ENCOUNTER — Ambulatory Visit: Payer: 59 | Admitting: Physical Therapy

## 2023-12-26 ENCOUNTER — Other Ambulatory Visit: Payer: Self-pay | Admitting: Student

## 2023-12-26 DIAGNOSIS — M6281 Muscle weakness (generalized): Secondary | ICD-10-CM

## 2023-12-26 DIAGNOSIS — Z1231 Encounter for screening mammogram for malignant neoplasm of breast: Secondary | ICD-10-CM

## 2023-12-26 DIAGNOSIS — M5412 Radiculopathy, cervical region: Secondary | ICD-10-CM | POA: Diagnosis not present

## 2023-12-26 DIAGNOSIS — R293 Abnormal posture: Secondary | ICD-10-CM

## 2023-12-26 NOTE — Therapy (Signed)
OUTPATIENT PHYSICAL THERAPY TREATMENT NOTE   Patient Name: Monica Huang MRN: [id number] DOB:[birth year], 43 y.o., female Today's Date: 12/26/2023  END OF SESSION:  PT End of Session - 12/26/23 1814     Visit Number 7    Number of Visits 13    Date for PT Re-Evaluation 01/14/24    PT Start Time 1745    PT Stop Time 1823    PT Time Calculation (min) 38 min    Equipment Utilized During Treatment Other (comment)    Activity Tolerance Patient tolerated treatment well    Behavior During Therapy WFL for tasks assessed/performed                  Past Medical History:  Diagnosis Date   Ambulates with cane    Chronic back pain    Chronic leg pain    Diabetes mellitus without complication (HCC)    Type II - no meds   Hyperlipidemia    diet controlle - no meds   Legally blind    uses cane - some limited vision   Macular degeneration    patient denies this dx  (dr street dx)   Pseudotumor cerebri syndrome 2005   shunt placed- and legally blind   Sciatica    Sleep apnea    "Mild" - has appt on 06/23/20 to see about cpap machine   Wears partial dentures    upper and lower   Past Surgical History:  Procedure Laterality Date   ANTERIOR CERVICAL DECOMP/DISCECTOMY FUSION N/A 05/25/2020   Procedure: ANTERIOR CERVICAL DISCECTOMY AND FUSION CERVICAL FIVE THROUGH CERVICAL SIX WITH PLATES, SCREWS, ALLOGRAFT AND LOCAL BONE GRAFT, VIVIGEN;  Surgeon: Kerrin Champagne, MD;  Location: [facility] OR;  Service: Orthopedics;  Laterality: N/A;   CSF SHUNT     2 revisions   MULTIPLE EXTRACTIONS WITH ALVEOLOPLASTY Bilateral 11/02/2017   Procedure: MULTIPLE EXTRACTION;  Surgeon: Ocie Doyne, DDS;  Location: [facility] OR;  Service: Oral Surgery;  Laterality: Bilateral;   Patient Active Problem List   Diagnosis Date Noted   Pain of right hip 10/14/2023   Dyshydrosis 06/18/2023   Encounter for long-term (current) use of NSAIDs 06/18/2023   Thoracic radiculopathy 05/25/2022   Abnormal uterine bleeding  05/11/2022   Low back pain potentially associated with radiculopathy 02/20/2022   Diabetes mellitus without complication (HCC)    S/P cervical spinal fusion 05/26/2020   Herniation of cervical intervertebral disc with radiculopathy 05/25/2020    Class: Chronic   Chronic pain of both knees 10/23/2019   Carpal tunnel syndrome 02/03/2019   Anxiety and depression 06/10/2016   Essential hypertension, benign 05/10/2015   Mechanical complication-ventricular(CSF) communicating shunt (HCC) 03/05/2013   Hidradenitis suppurativa 10/15/2012   Tobacco abuse 01/03/2012   Legally blind 01/03/2012   Obesity 01/03/2012    PCP: Alicia Amel, MD  REFERRING PROVIDER: London Sheer, MD  REFERRING DIAG: Radiculopathy, cervical region [phone]   THERAPY DIAG:  Radiculopathy, cervical region  Abnormal posture  Muscle weakness (generalized)  Rationale for Evaluation and Treatment: Rehabilitation  ONSET DATE: 09/27/2023  SUBJECTIVE:  SUBJECTIVE STATEMENT:  Pt stated neck is feeling good today, only pain is in the collarbones on both sides. Pt stated collarbone pain improved throughout session, specifically after pectoralis stretch    PERTINENT HISTORY:  DM 2 Pseudotumorcerbra (2005) Blindness   PAIN:  Are you having pain? "NPRS scale: 7/10","Pain location: B shoulders/neck ,"Pain description: stabbing,"Aggravating factors: R rotation, flexion","Relieving factors: neutral  PRECAUTIONS: None  RED FLAGS: Cervical red flags: none      WEIGHT BEARING RESTRICTIONS: No  FALLS:  Has patient fallen in last 6 months? No  LIVING ENVIRONMENT: Lives with: lives with their family Lives in: House/apartment Stairs: Yes: External: 20 steps; on right going up Has following equipment at home:   grab rail in shower, Red tipped cane (blindness)  OCCUPATION: not currently employed  PLOF: Independent  PATIENT GOALS: decrease neck pain and improve function  NEXT MD VISIT: 3 Month follow up from the 10/31/2023 visit  OBJECTIVE:  Note: Objective measures were completed at Evaluation unless otherwise noted.  DIAGNOSTIC FINDINGS:  Sterling compression/distraction:+   PATIENT SURVEYS:  FOTO Current:38%  Predicted 57%  12/24/2023: 53%  COGNITION: Overall cognitive status: Within functional limits for tasks assessed  SENSATION: Light touch: Impaired   POSTURE: rounded shoulders, forward head, and increased thoracic kyphosis  PALPATION: Tenderness to B trapezius, rhomboids and suboccipital musculature  CERVICAL ROM:   Active ROM A/PROM (deg) Eval 11/19/2023 AROM 12/13/23  Flexion 50   Extension 40 25% P!  Right lateral flexion  25%  Left lateral flexion  50%  Right rotation 40 75%  Left rotation 45 50%   P! Cervicothoracic region  UPPER EXTREMITY ROM:  Active ROM Right eval Left eval  Shoulder flexion 140 140  Shoulder extension    Shoulder abduction    Shoulder adduction    Shoulder extension    Shoulder internal rotation    Shoulder external rotation    Elbow flexion    Elbow extension    Wrist flexion    Wrist extension    Wrist ulnar deviation    Wrist radial deviation    Wrist pronation    Wrist supination     (Blank rows = not tested)  UPPER EXTREMITY MMT:  MMT Right eval Left eval  Shoulder flexion    Shoulder extension    Shoulder abduction    Shoulder adduction    Shoulder extension    Shoulder internal rotation    Shoulder external rotation    Middle trapezius    Lower trapezius    Elbow flexion 4- 4  Elbow extension 3+ 4+  Wrist flexion 4- 5  Wrist extension 3 5  Wrist ulnar deviation    Wrist radial deviation    Wrist pronation    Wrist supination    Grip strength 4- 4   (Blank rows = not tested)  CERVICAL SPECIAL  TESTS:  Neck flexor muscle endurance test: Positive, Spurling's test: Positive, and Distraction test: Positive 12/12/22 positive LUE paresthesias with ULTT, no bias tested    [facility] Adult PT Treatment:                                                DATE: 12/26/2023  Therapeutic Exercise: UBE 5' Cervical Rotation with towel overpressure 2x2' ea. Pectoralis stretch in doorway, 2x2' ea. Deep neck flexor training with ball into wall. Manual Therapy: Upper trap, suboccipital,  scalene release   [facility] Adult PT Treatment:                                                DATE: 12/13/23  Manual Therapy: L pec minor release 2 min Manual scalene stretch 30s x3 L first rib mob to tolerance 10x, combined with MWM into L shoulder flexion 5x Manual L SCM stretch 30s Cervical ROM re-assess/PROM                                                                                                              PATIENT EDUCATION:  Education details: HEP, functional cervical movement, pain management, healing times. Person educated: Patient and Niece  Education method: Explanation, Demonstration, Tactile cues, Verbal cues, and Handouts Education comprehension: verbalized understanding and returned demonstration  HOME EXERCISE PROGRAM: Access Code: [email or web address] URL: [email or web address] Date: 11/19/2023 Prepared by: Sheliah Plane  Exercises - Seated Cervical Rotation AROM  - 1 x daily - 7 x weekly - 2 sets - 15 reps - Seated Cervical Retraction Protraction AROM  - 1 x daily - 7 x weekly - 3 sets - 6-8 reps - 5s hold - Scapular Retraction with Resistance  - 1 x daily - 7 x weekly - 3 sets - 8-10 reps - 2s hold -Doorway stretch - 2x daily -  4-7 weekly -2 sets - 2 reps - 2 minute holds each side  ASSESSMENT:  CLINICAL IMPRESSION:  Pt attended physical therapy session for continuation of treatment regarding Neck pain and mobility impairment. Pt showed  great tolerance to treatment and  demonstrated improvement with neck pain and ROM. Pt stated the only real pain now is in the collarbone, upon closer examination it was found to be related to pectoralis major/minor motility, pt benefited from pec stretch. Pt required precise vocal/tactile cuing for safe and appropriate performance of today's activities. Continue with therapeutic focus on pectoralis motility, cervical ROM and stabilization in available pain free range.     11/19/2023 Eval impression: Patient is a 43 y.o. female who was seen today for physical therapy evaluation and treatment for cervical pain with radiating symptoms down BUE with R>L. Pt  demonstrated limited cervical ROM in all directions, impaired strength and sensation along the RUE with c6 myotome/dermatone being the most affected. Pt noted some tingling symptoms going into L hand at "random" however it wasn't as bad as the R. Pt is moderately painful with all cervical/shoulder motions, however, tolerated treatment well with a reduction in symptoms and no change in pain. Pt requires precise tactile and verbal cuing due to visual impairments. Pt requires the intervention of skilled outpatient physical therapy to address the aforementioned deficits and progress the patient towards a functional level in line with therapeutic goals.  OBJECTIVE IMPAIRMENTS: decreased mobility, impaired sensation, impaired tone, impaired vision/preception, and pain.   ACTIVITY LIMITATIONS: carrying, lifting, bending, squatting, and sleeping  PARTICIPATION LIMITATIONS: cleaning,  interpersonal relationship, community activity, and occupation  PERSONAL FACTORS: Fitness, Past/current experiences, and 1-2 comorbidities: Blindness, DM2  are also affecting patient's functional outcome.   REHAB POTENTIAL: Fair    CLINICAL DECISION MAKING: Evolving/moderate complexity  EVALUATION COMPLEXITY: Moderate   GOALS: Goals reviewed with patient? Yes  SHORT TERM GOALS: Target date:  12/10/2023   Improve cervical rotation to 55 degrees B with </= 6/10 pain to improve activity tolerance involving cervical motion Baseline: 40 Goal status: Progressing 12/24/2023  2.  Pt will improve RUE gross strength to 4/5 to demonstrate improvement with contractility and functionality of RUE Baseline: 4- Goal status: MET 12/24/2023   LONG TERM GOALS: Target date: 12/31/2023   Pt  will improve FOTO score from 38 to 57% to demonstrate improvement in overall function in relation to current impairments Baseline:  Goal status: INITIAL  2.  Pt will be independent with HEP to demonstrate competency necessary for at home management of symptoms and rehabilitation Baseline:  Goal status: MET 12/24/2023   PLAN:  PT FREQUENCY: 2x/week  PT DURATION: 6 weeks  PLANNED INTERVENTIONS: 97110-Therapeutic exercises, 97530- Therapeutic activity, 97112- Neuromuscular re-education, 97535- Self Care, and 40981- Manual therapy  PLAN FOR NEXT SESSION: review HEP,  progress PNF pattern quality and ensure HEP independence progress.  Sheliah Plane, PT, DPT 12/26/2023, 12:12 PM

## 2023-12-31 ENCOUNTER — Encounter: Payer: Self-pay | Admitting: Physical Therapy

## 2023-12-31 ENCOUNTER — Ambulatory Visit: Payer: 59 | Attending: Orthopedic Surgery | Admitting: Physical Therapy

## 2023-12-31 DIAGNOSIS — M5412 Radiculopathy, cervical region: Secondary | ICD-10-CM | POA: Diagnosis not present

## 2023-12-31 DIAGNOSIS — M6281 Muscle weakness (generalized): Secondary | ICD-10-CM | POA: Insufficient documentation

## 2023-12-31 DIAGNOSIS — R293 Abnormal posture: Secondary | ICD-10-CM | POA: Diagnosis not present

## 2023-12-31 NOTE — Therapy (Signed)
OUTPATIENT PHYSICAL THERAPY TREATMENT and PROGRESS NOTE   Patient Name: Monica Huang MRN: [id number] DOB:February 18, 1981, 43 y.o., female Today's Date: 12/31/2023  END OF SESSION:  PT End of Session - 12/31/23 1610     Visit Number 8    Number of Visits 13    Date for PT Re-Evaluation 01/14/24    PT Start Time 1615    PT Stop Time 1655    PT Time Calculation (min) 40 min    Activity Tolerance Patient tolerated treatment well    Behavior During Therapy WFL for tasks assessed/performed               PHYSICAL THERAPY DISCHARGE SUMMARY  Visits from Start of Care: 8  Current functional level related to goals / functional outcomes: See assessment   Remaining deficits: FOTO, (not assessed due to time constraints on this session.)   Education / Equipment: See assessment   Patient agrees to discharge. Patient goals were partially met. Patient is being discharged due to being pleased with the current functional level.      Past Medical History:  Diagnosis Date   Ambulates with cane    Chronic back pain    Chronic leg pain    Diabetes mellitus without complication (HCC)    Type II - no meds   Hyperlipidemia    diet controlle - no meds   Legally blind    uses cane - some limited vision   Macular degeneration    patient denies this dx  (dr street dx)   Pseudotumor cerebri syndrome 2005   shunt placed- and legally blind   Sciatica    Sleep apnea    "Mild" - has appt on 06/23/20 to see about cpap machine   Wears partial dentures    upper and lower   Past Surgical History:  Procedure Laterality Date   ANTERIOR CERVICAL DECOMP/DISCECTOMY FUSION N/A 05/25/2020   Procedure: ANTERIOR CERVICAL DISCECTOMY AND FUSION CERVICAL FIVE THROUGH CERVICAL SIX WITH PLATES, SCREWS, ALLOGRAFT AND LOCAL BONE GRAFT, VIVIGEN;  Surgeon: Kerrin Champagne, MD;  Location: [facility] OR;  Service: Orthopedics;  Laterality: N/A;   CSF SHUNT     2 revisions   MULTIPLE EXTRACTIONS WITH ALVEOLOPLASTY  Bilateral 11/02/2017   Procedure: MULTIPLE EXTRACTION;  Surgeon: Ocie Doyne, DDS;  Location: [facility] OR;  Service: Oral Surgery;  Laterality: Bilateral;   Patient Active Problem List   Diagnosis Date Noted   Pain of right hip 10/14/2023   Dyshydrosis 06/18/2023   Encounter for long-term (current) use of NSAIDs 06/18/2023   Thoracic radiculopathy 05/25/2022   Abnormal uterine bleeding 05/11/2022   Low back pain potentially associated with radiculopathy 02/20/2022   Diabetes mellitus without complication (HCC)    S/P cervical spinal fusion 05/26/2020   Herniation of cervical intervertebral disc with radiculopathy 05/25/2020    Class: Chronic   Chronic pain of both knees 10/23/2019   Carpal tunnel syndrome 02/03/2019   Anxiety and depression 06/10/2016   Essential hypertension, benign 05/10/2015   Mechanical complication-ventricular(CSF) communicating shunt (HCC) 03/05/2013   Hidradenitis suppurativa 10/15/2012   Tobacco abuse 01/03/2012   Legally blind 01/03/2012   Obesity 01/03/2012    PCP: Alicia Amel, MD  REFERRING PROVIDER: London Sheer, MD  REFERRING DIAG: Radiculopathy, cervical region [phone]   THERAPY DIAG:  Radiculopathy, cervical region  Abnormal posture  Muscle weakness (generalized)  Rationale for Evaluation and Treatment: Rehabilitation  ONSET DATE: 09/27/2023  SUBJECTIVE:  SUBJECTIVE STATEMENT:  Stated that her neck is feeling a lot better, no pain or anything, just feels like something is catching in her neck, especially when cough or performing bed mobility in a certain way. Happened this morning as well on the L side when mopping.    PERTINENT HISTORY:  DM 2 Pseudotumorcerbra (2005) Blindness   PAIN:  Are you having pain? "NPRS scale:  7/10","Pain location: B shoulders/neck ,"Pain description: stabbing,"Aggravating factors: R rotation, flexion","Relieving factors: neutral  PRECAUTIONS: None  RED FLAGS: Cervical red flags: none      WEIGHT BEARING RESTRICTIONS: No  FALLS:  Has patient fallen in last 6 months? No  LIVING ENVIRONMENT: Lives with: lives with their family Lives in: House/apartment Stairs: Yes: External: 20 steps; on right going up Has following equipment at home:  grab rail in shower, Red tipped cane (blindness)  OCCUPATION: not currently employed  PLOF: Independent  PATIENT GOALS: decrease neck pain and improve function  NEXT MD VISIT: 3 Month follow up from the 10/31/2023 visit  OBJECTIVE:  Note: Objective measures were completed at Evaluation unless otherwise noted.  DIAGNOSTIC FINDINGS:  Sterling compression/distraction:+   PATIENT SURVEYS:  FOTO Current:38%  Predicted 57%  12/24/2023: 53%  COGNITION: Overall cognitive status: Within functional limits for tasks assessed  SENSATION: Light touch: Impaired   POSTURE: rounded shoulders, forward head, and increased thoracic kyphosis  PALPATION: Tenderness to B trapezius, rhomboids and suboccipital musculature  CERVICAL ROM:   Active ROM A/PROM (deg) Eval 11/19/2023 AROM 12/13/23  Flexion 50   Extension 40 25% P!  Right lateral flexion  25%  Left lateral flexion  50%  Right rotation 40 75%  Left rotation 45 50%   P! Cervicothoracic region  UPPER EXTREMITY ROM:  Active ROM Right eval Left eval  Shoulder flexion 140 140  Shoulder extension    Shoulder abduction    Shoulder adduction    Shoulder extension    Shoulder internal rotation    Shoulder external rotation    Elbow flexion    Elbow extension    Wrist flexion    Wrist extension    Wrist ulnar deviation    Wrist radial deviation    Wrist pronation    Wrist supination     (Blank rows = not tested)  UPPER EXTREMITY MMT:  MMT Right eval Left eval   Shoulder flexion    Shoulder extension    Shoulder abduction    Shoulder adduction    Shoulder extension    Shoulder internal rotation    Shoulder external rotation    Middle trapezius    Lower trapezius    Elbow flexion 4- 4  Elbow extension 3+ 4+  Wrist flexion 4- 5  Wrist extension 3 5  Wrist ulnar deviation    Wrist radial deviation    Wrist pronation    Wrist supination    Grip strength 4- 4   (Blank rows = not tested)  CERVICAL SPECIAL TESTS:  Neck flexor muscle endurance test: Positive, Spurling's test: Positive, and Distraction test: Positive 12/12/22 positive LUE paresthesias with ULTT, no bias tested   [facility] Adult PT Treatment:                                                DATE: 12/31/2023  Therapeutic Exercise: SCM releasee with stretch Neuromuscular re-ed: Chin tucks with ball Cervical stabilization  Self Care: Long term HEP review POC discussion  [facility] Adult PT Treatment:                                                DATE: 12/26/2023  Therapeutic Exercise: UBE 5' Cervical Rotation with towel overpressure 2x2' ea. Pectoralis stretch in doorway, 2x2' ea. Deep neck flexor training with ball into wall. Manual Therapy: Upper trap, suboccipital, scalene release                                                                                                                PATIENT EDUCATION:  Education details: HEP, functional cervical movement, pain management, healing times. Person educated: Patient and Niece  Education method: Explanation, Demonstration, Tactile cues, Verbal cues, and Handouts Education comprehension: verbalized understanding and returned demonstration  HOME EXERCISE PROGRAM: Access Code: [email or web address] URL: [email or web address] Date: 12/31/2023 Prepared by: Sheliah Plane  Exercises - Seated Cervical Rotation AROM  - 1 x daily - 7 x weekly - 2 sets - 15 reps - Seated Cervical Retraction Protraction AROM  - 1 x daily - 7 x  weekly - 3 sets - 6-8 reps - 5s hold - Scapular Retraction with Resistance  - 1 x daily - 7 x weekly - 3 sets - 8-10 reps - 2s hold - Sternocleidomastoid Stretch  - 1 x daily - 7 x weekly - 2 sets - 2 reps - 62m hold - Sternocleidomastoid Release  - 1 x daily - 7 x weekly - 1 sets - 1 reps - 68m hold  ASSESSMENT:  CLINICAL IMPRESSION:  Pt attended physical therapy session for continuation of treatment regarding neck pain and cervical dysfunction. Today's treatment focused on improvement of  neck AROM and pain reduction alongside cervical stabilization. Pt showed  great tolerance to treatment and demonstrated improvement with cervical AROM and pain levels with end range motion by the conclusion of treatment . Pt reports being able to participate in all ADLs . Marland Kitchen Pt required moderate verbal cuing as well as minimal assistance for safe and appropriate performance of today's activities. Pt requested for this to be the last visit, Pt has met all except 1 therapeutic goal, however verbalizes being a functional level appropriate for personal goals. Pt acknowledges process of having to receive a new referral if she wishes to be seen again in the future, as well as education provided for long term HEP and management of symptoms. Pt is to be discharge per pt request.    11/19/2023 Eval impression: Patient is a 43 y.o. female who was seen today for physical therapy evaluation and treatment for cervical pain with radiating symptoms down BUE with R>L. Pt  demonstrated limited cervical ROM in all directions, impaired strength and sensation along the RUE with c6 myotome/dermatone being the most affected. Pt noted some tingling symptoms going into L hand at "random" however it wasn't as bad as  the R. Pt is moderately painful with all cervical/shoulder motions, however, tolerated treatment well with a reduction in symptoms and no change in pain. Pt requires precise tactile and verbal cuing due to visual impairments. Pt  requires the intervention of skilled outpatient physical therapy to address the aforementioned deficits and progress the patient towards a functional level in line with therapeutic goals.  OBJECTIVE IMPAIRMENTS: decreased mobility, impaired sensation, impaired tone, impaired vision/preception, and pain.   ACTIVITY LIMITATIONS: carrying, lifting, bending, squatting, and sleeping  PARTICIPATION LIMITATIONS: cleaning, interpersonal relationship, community activity, and occupation  PERSONAL FACTORS: Fitness, Past/current experiences, and 1-2 comorbidities: Blindness, DM2  are also affecting patient's functional outcome.   REHAB POTENTIAL: Fair    CLINICAL DECISION MAKING: Evolving/moderate complexity  EVALUATION COMPLEXITY: Moderate   GOALS: Goals reviewed with patient? Yes  SHORT TERM GOALS: Target date: 12/10/2023   Improve cervical rotation to 55 degrees B with </= 6/10 pain to improve activity tolerance involving cervical motion Baseline: 40 Goal status: MET 12/31/2023  2.  Pt will improve RUE gross strength to 4/5 to demonstrate improvement with contractility and functionality of RUE Baseline: 4- Goal status: MET 12/24/2023   LONG TERM GOALS: Target date: 12/31/2023   Pt  will improve FOTO score from 38 to 57% to demonstrate improvement in overall function in relation to current impairments Baseline:  Goal status: PROGRESSING 12/24/2023  2.  Pt will be independent with HEP to demonstrate competency necessary for at home management of symptoms and rehabilitation Baseline:  Goal status: MET 12/24/2023   PLAN:  PT FREQUENCY: 2x/week  PT DURATION: 6 weeks  PLANNED INTERVENTIONS: 97110-Therapeutic exercises, 97530- Therapeutic activity, 97112- Neuromuscular re-education, 97535- Self Care, and 16109- Manual therapy  PLAN FOR NEXT SESSION: review HEP,  progress PNF pattern quality and ensure HEP independence progress.  Sheliah Plane, PT, DPT 12/31/2023, 5:01 PM

## 2024-01-11 ENCOUNTER — Other Ambulatory Visit: Payer: Self-pay | Admitting: Student

## 2024-01-11 ENCOUNTER — Other Ambulatory Visit: Payer: Self-pay | Admitting: Orthopedic Surgery

## 2024-01-11 DIAGNOSIS — L732 Hidradenitis suppurativa: Secondary | ICD-10-CM

## 2024-01-15 ENCOUNTER — Ambulatory Visit: Payer: 59

## 2024-01-23 ENCOUNTER — Ambulatory Visit: Payer: 59

## 2024-01-29 ENCOUNTER — Ambulatory Visit
Admission: RE | Admit: 2024-01-29 | Discharge: 2024-01-29 | Disposition: A | Discharge status: Home / Self Care | Payer: 59 | Source: Ambulatory Visit | Attending: Family Medicine | Admitting: Family Medicine

## 2024-01-29 DIAGNOSIS — Z1231 Encounter for screening mammogram for malignant neoplasm of breast: Secondary | ICD-10-CM | POA: Diagnosis not present

## 2024-01-30 ENCOUNTER — Ambulatory Visit: Payer: 59 | Admitting: Orthopedic Surgery

## 2024-02-08 ENCOUNTER — Other Ambulatory Visit: Payer: Self-pay | Admitting: Student

## 2024-02-08 ENCOUNTER — Other Ambulatory Visit: Payer: Self-pay | Admitting: Orthopedic Surgery

## 2024-02-08 DIAGNOSIS — L732 Hidradenitis suppurativa: Secondary | ICD-10-CM

## 2024-05-06 ENCOUNTER — Encounter: Payer: Self-pay | Admitting: *Deleted

## 2024-05-14 ENCOUNTER — Ambulatory Visit: Admitting: Student

## 2024-06-18 ENCOUNTER — Encounter: Admitting: Family Medicine

## 2024-06-20 ENCOUNTER — Encounter: Admitting: Family Medicine

## 2024-07-14 ENCOUNTER — Encounter: Payer: Self-pay | Admitting: Family Medicine

## 2024-07-14 ENCOUNTER — Ambulatory Visit (INDEPENDENT_AMBULATORY_CARE_PROVIDER_SITE_OTHER): Admitting: Family Medicine

## 2024-07-14 VITALS — BP 121/87 | HR 97 | Temp 98.1°F | Ht 69.0 in | Wt 299.5 lb

## 2024-07-14 DIAGNOSIS — E119 Type 2 diabetes mellitus without complications: Secondary | ICD-10-CM

## 2024-07-14 DIAGNOSIS — R109 Unspecified abdominal pain: Secondary | ICD-10-CM | POA: Diagnosis not present

## 2024-07-14 DIAGNOSIS — L732 Hidradenitis suppurativa: Secondary | ICD-10-CM

## 2024-07-14 LAB — POCT URINALYSIS DIP (MANUAL ENTRY)
Glucose, UA: NEGATIVE mg/dL
Nitrite, UA: NEGATIVE
Spec Grav, UA: 1.025 (ref 1.010–1.025)
Urobilinogen, UA: 0.2 U/dL
pH, UA: 7 (ref 5.0–8.0)

## 2024-07-14 LAB — POCT GLYCOSYLATED HEMOGLOBIN (HGB A1C): HbA1c, POC (controlled diabetic range): 8.7 % — AB (ref 0.0–7.0)

## 2024-07-14 MED ORDER — DOXYCYCLINE HYCLATE 100 MG PO TABS: 100.0000 mg | ORAL_TABLET | Freq: Two times a day (BID) | ORAL | 0 refills | Status: DC

## 2024-07-14 MED ORDER — SITAGLIPTIN PHOSPHATE 50 MG PO TABS: 50.0000 mg | ORAL_TABLET | Freq: Every day | ORAL | 3 refills | Status: AC

## 2024-07-14 MED ORDER — CLINDAMYCIN HCL 300 MG PO CAPS: 300.0000 mg | ORAL_CAPSULE | Freq: Two times a day (BID) | ORAL | 0 refills | Status: DC

## 2024-07-14 NOTE — Progress Notes (Incomplete)
    SUBJECTIVE:   CHIEF COMPLAINT / HPI:   T2DM -Presenting today for routine DM check up  -Takes januvia  25 daily  -Doesn't want to take metformin  due to personal research and experience  -Reports prior pancreas issue with ozempic /mounjaro    HS -Ran out of clindamycin  for last month  -Stomach area, groin, thighs, armpits, chest  -Worsening over last month without medication  -Reports Doxycycline  not effective for her  Back, side pain  -Started Friday, got better on Saturday  -No burning or pain with peeing -No increased frequency -Does report increased urgency  -Unknown if blood in urine  -No fevers    PERTINENT  PMH / PSH:  HTN, T2DM, Chronic pain, legal blindness due to pseudomotor cerebri in 2005   OBJECTIVE:   BP 121/87   Pulse 97   Temp 98.1 F (36.7 C) (Oral)   Ht 5' 9 (1.753 m)   Wt 299 lb 8 oz (135.9 kg)   LMP 06/15/2024   SpO2 100%   BMI 44.23 kg/m   General: Well-appearing. Resting comfortably in room. CV: Normal S1/S2. No extra heart sounds. Warm and well-perfused. Pulm: Breathing comfortably on room air. CTAB. No increased WOB. Abd: Soft, non-tender, non-distended. No CVA tenderness.  Skin:  Warm, dry. Scattered nodules and cysts on chest, armpits, gluteal cleft. No active bleeding or drainage.  Psych: Pleasant and appropriate.   Gluteal skin fold exam chaperoned by resident colleague.    ASSESSMENT/PLAN:   Assessment & Plan Diabetes mellitus without complication (HCC) A1c not well controlled today at 8.7.  -Increasing to Januvia  50 mg daily.  Hidradenitis suppurativa Previously on oral clindamycin  for treatment/prevention long term. Patient defers  Flank pain Reassuring physical exam, low concern for acute abdomen. Possible UTI symptoms including increased urgency, unknown if hematuria present given visual barrier. Also concern for possible nephrolithiasis.  - UA dipstick today    RTC on Thursday for lab appointment. RTC in 1 month for  continued follow up.   Damien Cassis, MD [facility]

## 2024-07-14 NOTE — Progress Notes (Unsigned)
    SUBJECTIVE:   CHIEF COMPLAINT / HPI:   T2DM Takes januvia   Doesn't want to take metformin  - personal research and experience  Prior pancreas issue with ozempic /mounjaro    HS Out of clindamycin  for last month  Stomach area, groin, thighs, armpits, chest  Worsening over last month without medication  Reports Doxycycline  not effective  Back, side pain  Started Friday  Got better on Saturday  No burning or pain with peeing No increased frequency Increased urgency  No fevers    PERTINENT  PMH / PSH: *** HTN, T2DM, Chronic pain   OBJECTIVE:   BP 121/87   Pulse 97   Temp 98.1 F (36.7 C) (Oral)   Ht 5' 9 (1.753 m)   Wt 299 lb 8 oz (135.9 kg)   LMP 06/15/2024   SpO2 100%   BMI 44.23 kg/m   General: Well-appearing. Resting comfortably in room. CV: Normal S1/S2. No extra heart sounds. Warm and well-perfused. Pulm: Breathing comfortably on room air. CTAB. No increased WOB. Abd: Soft, non-tender, non-distended. No CVA tenderness.  Skin:  Warm, dry. Scattered nodules and cysts on chest, armpits, gluteal cleft. Psych: Pleasant and appropriate.   Gluteal skin fold exam chaperoned by resident colleague.    ASSESSMENT/PLAN:   Assessment & Plan Diabetes mellitus without complication (HCC)  Flank pain  Hidradenitis suppurativa      Damien Cassis, MD [facility]

## 2024-07-14 NOTE — Patient Instructions (Addendum)
 Thank you for visiting clinic today and allowing us  to participate in your care!  Your A1c is elevated today. Please increase your Januvia  to 50 mg daily. Please plan to see the eye doctor yearly.   For your HS, please take doxycycline  twice a day for the next week. We placed a referral to dermatology for you. You can continue your clindamycin  afterwards.   Please call [facility] at [phone] to see them again.  Please return for your labwork on Thursday afternoon at 2:00 PM.   Please schedule an appointment in 1 month for follow up.   Reach out any time with any questions or concerns you may have - we are here for you!  Damien Cassis, MD [facility] [phone]

## 2024-07-15 ENCOUNTER — Ambulatory Visit: Payer: Self-pay | Admitting: Family Medicine

## 2024-07-15 LAB — MICROALBUMIN / CREATININE URINE RATIO
Creatinine, Urine: 278 mg/dL
Microalb/Creat Ratio: 10 mg/g{creat} (ref 0–29)
Microalbumin, Urine: 28.7 ug/mL

## 2024-07-15 NOTE — Assessment & Plan Note (Signed)
 Previously on oral clindamycin  for treatment/prevention long term. Patient defers doxycycline  today, prefers to stay on clindamycin . -Refilled clindamycin  300 BID  -Discussed and placed referral to dermatology

## 2024-07-15 NOTE — Assessment & Plan Note (Signed)
 A1c not-well controlled today at 8.7.   -Increasing to Januvia  50 mg daily.  -BMP, ACR today  -Discussed importance of annual ophthalmology follow up

## 2024-07-17 ENCOUNTER — Other Ambulatory Visit

## 2024-07-17 ENCOUNTER — Other Ambulatory Visit: Payer: Self-pay

## 2024-07-27 DIAGNOSIS — M25512 Pain in left shoulder: Secondary | ICD-10-CM | POA: Diagnosis not present

## 2024-07-31 ENCOUNTER — Ambulatory Visit: Payer: Self-pay | Admitting: Family Medicine

## 2024-08-01 ENCOUNTER — Encounter: Payer: Self-pay | Admitting: Family Medicine

## 2024-08-01 ENCOUNTER — Ambulatory Visit: Admitting: Family Medicine

## 2024-08-01 VITALS — BP 128/84 | HR 95 | Ht <= 58 in | Wt 299.1 lb

## 2024-08-01 DIAGNOSIS — E119 Type 2 diabetes mellitus without complications: Secondary | ICD-10-CM

## 2024-08-01 DIAGNOSIS — H61892 Other specified disorders of left external ear: Secondary | ICD-10-CM | POA: Diagnosis not present

## 2024-08-01 MED ORDER — DOXYCYCLINE HYCLATE 100 MG PO TABS: 100.0000 mg | ORAL_TABLET | Freq: Two times a day (BID) | ORAL | 0 refills | Status: AC

## 2024-08-01 NOTE — Assessment & Plan Note (Signed)
 BMP, Lipid panel today given lab unavailability at previous visit. Discussed Liberate study - patient expressed interest. Referral placed and appointment made with Dr Koval next week.

## 2024-08-01 NOTE — Progress Notes (Deleted)
 Monica Huang is {Pc accompanied by:5710} Sources of clinical information for visit is/are {Information source:60032}. Nursing assessment for this office visit was reviewed with the patient for accuracy and revision.   Previous Report(s) Reviewed: {Outside review:15817}     10/12/2023    2:12 PM  Depression screen PHQ 2/9  Decreased Interest 1  Down, Depressed, Hopeless 0  PHQ - 2 Score 1  Altered sleeping 0  Tired, decreased energy 0  Change in appetite 0  Feeling bad or failure about yourself  0  Trouble concentrating 0  Moving slowly or fidgety/restless 0  Suicidal thoughts 0  PHQ-9 Score 1   Flowsheet Row Office Visit from 10/12/2023 in [facility] Of [place] Office Visit from 11/08/2022 in [facility] [place]. [facility] Office Visit from 05/19/2022 in [facility] [place] [facility]. [facility]  Thoughts that you would be better off dead, or of hurting yourself in some way Not at all Not at all Not at all  PHQ-9 Total Score 1 0 1       10/12/2023    2:12 PM 07/07/2023    7:30 PM 06/15/2023    4:10 PM 11/08/2022   11:41 AM 10/25/2020    4:12 PM  Fall Risk   Falls in the past year? 0 0 0 1 0  Number falls in past yr: 0 0 0 0 0  Injury with Fall? 0 0 0 0 0  Risk for fall due to : Impaired vision No Fall Risks     Follow up Falls evaluation completed Falls prevention discussed;Education provided;Falls evaluation completed          10/12/2023    2:12 PM 07/07/2023    7:28 PM 11/08/2022   11:41 AM  PHQ9 SCORE ONLY  PHQ-9 Total Score 1  0  0      Data saved with a previous flowsheet row definition    There are no preventive care reminders to display for this patient.  Health Maintenance Due  Topic Date Due   Pneumococcal Vaccine (1 of 2 - PCV) Never done   HPV VACCINES (1 - 3-dose SCDM series) Never done   OPHTHALMOLOGY EXAM  06/03/2015   DTaP/Tdap/Td  (2 - Td or Tdap) 08/07/2023   Diabetic kidney evaluation - eGFR measurement  06/14/2024   Medicare Annual Wellness (AWV)  07/05/2024   COVID-19 Vaccine (5 - 2025-26 season) 07/28/2024      History/P.E. limitations: {exam; limitations ed:60112}  There are no preventive care reminders to display for this patient.  Diabetes Health Maintenance Due  Topic Date Due   OPHTHALMOLOGY EXAM  06/03/2015   HEMOGLOBIN A1C  01/14/2025   FOOT EXAM  Discontinued    Health Maintenance Due  Topic Date Due   Pneumococcal Vaccine (1 of 2 - PCV) Never done   HPV VACCINES (1 - 3-dose SCDM series) Never done   OPHTHALMOLOGY EXAM  06/03/2015   DTaP/Tdap/Td (2 - Td or Tdap) 08/07/2023   Diabetic kidney evaluation - eGFR measurement  06/14/2024   Medicare Annual Wellness (AWV)  07/05/2024   COVID-19 Vaccine (5 - 2025-26 season) 07/28/2024     No chief complaint on file.    Discussed the use of AI scribe software for clinical note transcription with the patient, who gave verbal consent to proceed.  History of Present Illness  SDOH Screenings   Food Insecurity: No Food Insecurity (07/07/2023)  Housing: Low Risk  (07/07/2023)  Transportation Needs: No Transportation Needs (07/07/2023)  Utilities: Not At Risk (07/07/2023)  Alcohol Screen: Low Risk  (07/07/2023)  Depression (PHQ2-9): Low Risk  (10/12/2023)  Financial Resource Strain: Low Risk  (07/07/2023)  Physical Activity: Insufficiently Active (07/07/2023)  Social Connections: Moderately Isolated (07/07/2023)  Stress: No Stress Concern Present (07/07/2023)  Tobacco Use: High Risk (08/01/2024)  Health Literacy: Adequate Health Literacy (07/07/2023)   --------------------------------------------------------------------------------------------------------------------------------------------- Visit Problem List with Assessment and Plan   Assessment and Plan Assessment & Plan      No problem-specific Assessment & Plan notes found for this  encounter.

## 2024-08-01 NOTE — Progress Notes (Signed)
    SUBJECTIVE:   CHIEF COMPLAINT / HPI:   L ear bump For past almost week, patient has noticed tender bump behind L ear. Reports possible change in size, unsure if draining. Has been putting neosporin on it. NO hearing changes. No ear pain otherwise. No fevers.   PERTINENT  PMH / PSH: T2DM, Legal blindness, HS, HTN  OBJECTIVE:   BP 128/84   Pulse 95   Ht 1' (0.305 m)   Wt 299 lb 2 oz (135.7 kg)   LMP 06/15/2024   SpO2 99%   BMI 1460.47 kg/m   General: No acute distress. Resting comfortably in room. ENT: Nonbulging, nonerythematous TM bilaterally.  CV: Normal S1/S2. No extra heart sounds. Warm and well-perfused. Pulm: Breathing comfortably on room air. CTAB. No increased WOB. Skin:  ~1 cm ovular, erythematous, tender, fluctuant nodule behind L ear. No active drainage or bleeding.  Psych: Pleasant and appropriate.    ASSESSMENT/PLAN:   Assessment & Plan Nodule of external ear, left Given exam findings, concern for infection. Patient defers I&D, prefers trying antibiotic course first. Is on Clindamycin  long-term for HS.  - Discussed and ordered Doxycyline 100 BID x 5 days - Discussed warm compresses - RTC if not improving over next week Diabetes mellitus without complication (HCC) BMP, Lipid panel today given lab unavailability at previous visit. Discussed Liberate study - patient expressed interest. Referral placed and appointment made with Dr Koval next week.   Damien Cassis, MD [facility]

## 2024-08-01 NOTE — Patient Instructions (Addendum)
 Thank you for visiting clinic today and allowing us  to participate in your care!  For the bump behind your ear, please take the antibiotic as prescribed and try to put warm compresses over the area. Please return for a visit if it is not improving.    You are scheduled to see our clinical pharmacist Dr Koval next week to further discuss the Liberate study.  08/05/2024  9:00 AM Koval, Peter G, RPH-CPP [facility]    Reach out any time with any questions or concerns you may have - we are here for you!  Damien Cassis, MD [facility] [phone]

## 2024-08-02 LAB — BASIC METABOLIC PANEL WITH GFR
BUN/Creatinine Ratio: 15 (ref 9–23)
BUN: 10 mg/dL (ref 6–24)
CO2: 22 mmol/L (ref 20–29)
Calcium: 9.4 mg/dL (ref 8.7–10.2)
Chloride: 99 mmol/L (ref 96–106)
Creatinine, Ser: 0.68 mg/dL (ref 0.57–1.00)
Glucose: 159 mg/dL — ABNORMAL HIGH (ref 70–99)
Potassium: 4.2 mmol/L (ref 3.5–5.2)
Sodium: 134 mmol/L (ref 134–144)
eGFR: 111 mL/min/1.73 (ref >59)

## 2024-08-02 LAB — LIPID PANEL
Chol/HDL Ratio: 3.9 ratio (ref 0.0–4.4)
Cholesterol, Total: 164 mg/dL (ref 100–199)
HDL: 42 mg/dL (ref >39)
LDL Chol Calc (NIH): 98 mg/dL (ref 0–99)
Triglycerides: 137 mg/dL (ref 0–149)
VLDL Cholesterol Cal: 24 mg/dL (ref 5–40)

## 2024-08-04 ENCOUNTER — Ambulatory Visit: Payer: Self-pay | Admitting: Family Medicine

## 2024-08-04 ENCOUNTER — Telehealth: Payer: Self-pay

## 2024-08-04 DIAGNOSIS — L600 Ingrowing nail: Secondary | ICD-10-CM

## 2024-08-04 MED ORDER — ROSUVASTATIN CALCIUM 10 MG PO TABS: 10.0000 mg | ORAL_TABLET | Freq: Every day | ORAL | 3 refills | Status: AC

## 2024-08-04 NOTE — Telephone Encounter (Signed)
Podiatry referral placed per patient request

## 2024-08-04 NOTE — Telephone Encounter (Signed)
 Patient calls nurse line requesting referral for Podiatrist.   She states that she would like to see specialist for diabetic foot exam and due to how her toe nails grow.   Patient was seen in office on 08/01/24. Please advise if referral can be placed or if additional appointment is needed.   Chiquita JAYSON English, RN

## 2024-08-05 ENCOUNTER — Ambulatory Visit: Admitting: Pharmacist

## 2024-08-05 ENCOUNTER — Telehealth: Payer: Self-pay | Admitting: Pharmacist

## 2024-08-05 NOTE — Telephone Encounter (Signed)
 Attempted to contact patient for follow-up of missed appointment.   Left HIPAA compliant voice mail requesting call back to direct phone: [phone] and main office number to reschedule.   Total time with patient call and documentation of interaction: 3 minutes.

## 2024-08-07 ENCOUNTER — Telehealth: Payer: Self-pay | Admitting: *Deleted

## 2024-08-07 NOTE — Progress Notes (Signed)
 Complex Care Management Care Guide Note  08/07/2024 Name: Monica Huang MRN: [id number] DOB: 1981-04-07  Monica Huang is a 43 y.o. year old female who is a primary care patient of Diona Perkins, MD and is actively engaged with the care management team. I reached out to [employer] by phone today to assist with re-scheduling  with the Pharmacist.  Follow up plan: Successful telephone outreach attempt made. Patient will call back to reschedule with Dr. Koval. Declines need for care guide to follow back up to reschedule. Harlene Satterfield  [facility] Guide  Direct Dial: [phone]  Fax [phone]

## 2024-08-11 ENCOUNTER — Encounter: Payer: Self-pay | Admitting: Podiatry

## 2024-08-11 ENCOUNTER — Ambulatory Visit (INDEPENDENT_AMBULATORY_CARE_PROVIDER_SITE_OTHER): Admitting: Podiatry

## 2024-08-11 DIAGNOSIS — M79674 Pain in right toe(s): Secondary | ICD-10-CM

## 2024-08-11 DIAGNOSIS — B351 Tinea unguium: Secondary | ICD-10-CM

## 2024-08-11 DIAGNOSIS — E1149 Type 2 diabetes mellitus with other diabetic neurological complication: Secondary | ICD-10-CM | POA: Diagnosis not present

## 2024-08-11 DIAGNOSIS — L600 Ingrowing nail: Secondary | ICD-10-CM | POA: Diagnosis not present

## 2024-08-11 DIAGNOSIS — E1140 Type 2 diabetes mellitus with diabetic neuropathy, unspecified: Secondary | ICD-10-CM

## 2024-08-11 DIAGNOSIS — M79675 Pain in left toe(s): Secondary | ICD-10-CM | POA: Diagnosis not present

## 2024-08-13 NOTE — Progress Notes (Signed)
 Subjective:   Patient ID: Monica Huang, female   DOB: 43 y.o.   MRN: [id number]   HPI Patient presents stating that she does have chronic ingrown toenails right and left foot big toes and all nails are incurvated and sore for her and does have diabetes which is under reasonably good control with her last A1c up moderately at 8.5.  Patient smokes half pack per day and is not active with obesity noted   Review of Systems  All other systems reviewed and are negative.       Objective:  Physical Exam Vitals and nursing note reviewed.  Constitutional:      Appearance: She is well-developed.  Pulmonary:     Effort: Pulmonary effort is normal.  Musculoskeletal:        General: Normal range of motion.  Skin:    General: Skin is warm.  Neurological:     Mental Status: She is alert.     Neurovascular status found to be intact muscle strength found to be adequate range of motion within normal limits with the patient noted to have incurvated lateral borders of the big toe bilateral no redness no active drainage moderate discomfort at is noted to have good digital perfusion well-oriented with all nails being thickened and irritate     Assessment:  Chronic ingrown toenail deformity hallux bilateral along with all nails to be incurvated and tender but not to the same degree     Plan:  H&P reviewed at great length discussed diabetes and that ultimately had like to fix these nailbeds but I did like to see her sugar and slightly better shape than it is now.  She agrees to this and today debridement of all nailbeds accomplished with no iatrogenic bleeding and patient will be seen back for permanent procedures depending on the response to conservative care.  All questions answered today

## 2024-08-14 ENCOUNTER — Ambulatory Visit: Admitting: Orthopedic Surgery

## 2024-08-18 ENCOUNTER — Ambulatory Visit: Admitting: Orthopedic Surgery

## 2024-09-26 DIAGNOSIS — G4733 Obstructive sleep apnea (adult) (pediatric): Secondary | ICD-10-CM | POA: Diagnosis not present

## 2024-09-29 ENCOUNTER — Encounter: Payer: Self-pay | Admitting: Radiology

## 2024-10-06 ENCOUNTER — Ambulatory Visit: Admitting: Family Medicine

## 2024-11-06 ENCOUNTER — Encounter

## 2024-11-14 ENCOUNTER — Telehealth: Payer: Self-pay

## 2024-11-14 NOTE — Telephone Encounter (Signed)
 Patient was identified as falling into the [place] Measure - Diabetes.   Patient was: Left voicemail to schedule with primary care provider.

## 2024-11-24 ENCOUNTER — Other Ambulatory Visit: Payer: Self-pay | Admitting: Family Medicine

## 2024-11-24 DIAGNOSIS — L732 Hidradenitis suppurativa: Secondary | ICD-10-CM

## 2024-11-24 NOTE — Telephone Encounter (Signed)
 Chart reviewed. Rx refilled. Requesting patient fu.

## 2024-12-16 ENCOUNTER — Encounter: Payer: Self-pay | Admitting: Family Medicine
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
## 2025-02-24 ENCOUNTER — Ambulatory Visit: Admitting: Dermatology
